# Patient Record
Sex: Female | Born: 1970 | Race: Black or African American | Hispanic: No | Marital: Single | Smoking: Current every day smoker
Health system: Southern US, Community
[De-identification: ages and names within clinical notes are randomized; demographics above are authoritative.]

## PROBLEM LIST (undated history)

## (undated) DIAGNOSIS — M797 Fibromyalgia: Secondary | ICD-10-CM

## (undated) DIAGNOSIS — M1990 Unspecified osteoarthritis, unspecified site: Secondary | ICD-10-CM

## (undated) DIAGNOSIS — M069 Rheumatoid arthritis, unspecified: Secondary | ICD-10-CM

## (undated) DIAGNOSIS — L309 Dermatitis, unspecified: Secondary | ICD-10-CM

## (undated) DIAGNOSIS — I10 Essential (primary) hypertension: Secondary | ICD-10-CM

## (undated) DIAGNOSIS — E119 Type 2 diabetes mellitus without complications: Secondary | ICD-10-CM

---

## 2006-09-15 ENCOUNTER — Emergency Department (HOSPITAL_COMMUNITY): Admission: EM | Admit: 2006-09-15 | Discharge: 2006-09-15 | Payer: Self-pay | Admitting: Emergency Medicine

## 2006-11-18 ENCOUNTER — Ambulatory Visit: Payer: Self-pay | Admitting: Internal Medicine

## 2007-02-10 ENCOUNTER — Encounter (INDEPENDENT_AMBULATORY_CARE_PROVIDER_SITE_OTHER): Payer: Self-pay | Admitting: Internal Medicine

## 2007-02-10 ENCOUNTER — Other Ambulatory Visit: Admission: RE | Admit: 2007-02-10 | Discharge: 2007-02-10 | Payer: Self-pay | Admitting: Internal Medicine

## 2007-02-10 ENCOUNTER — Ambulatory Visit: Payer: Self-pay | Admitting: Internal Medicine

## 2007-02-13 ENCOUNTER — Ambulatory Visit: Payer: Self-pay | Admitting: Internal Medicine

## 2007-05-05 ENCOUNTER — Ambulatory Visit: Payer: Self-pay | Admitting: Internal Medicine

## 2007-05-05 DIAGNOSIS — R609 Edema, unspecified: Secondary | ICD-10-CM | POA: Insufficient documentation

## 2007-05-08 ENCOUNTER — Ambulatory Visit: Payer: Self-pay | Admitting: Internal Medicine

## 2007-06-15 ENCOUNTER — Encounter: Admission: RE | Admit: 2007-06-15 | Discharge: 2007-07-20 | Payer: Self-pay | Admitting: Anesthesiology

## 2007-06-28 ENCOUNTER — Encounter (INDEPENDENT_AMBULATORY_CARE_PROVIDER_SITE_OTHER): Payer: Self-pay | Admitting: Internal Medicine

## 2007-06-28 DIAGNOSIS — E782 Mixed hyperlipidemia: Secondary | ICD-10-CM | POA: Insufficient documentation

## 2007-07-11 ENCOUNTER — Telehealth (INDEPENDENT_AMBULATORY_CARE_PROVIDER_SITE_OTHER): Payer: Self-pay | Admitting: Internal Medicine

## 2007-07-11 DIAGNOSIS — L732 Hidradenitis suppurativa: Secondary | ICD-10-CM | POA: Insufficient documentation

## 2007-11-09 ENCOUNTER — Ambulatory Visit: Payer: Self-pay | Admitting: Nurse Practitioner

## 2007-11-09 DIAGNOSIS — R51 Headache: Secondary | ICD-10-CM | POA: Insufficient documentation

## 2007-11-09 DIAGNOSIS — IMO0001 Reserved for inherently not codable concepts without codable children: Secondary | ICD-10-CM | POA: Insufficient documentation

## 2007-11-09 DIAGNOSIS — R519 Headache, unspecified: Secondary | ICD-10-CM | POA: Insufficient documentation

## 2008-01-02 ENCOUNTER — Encounter (INDEPENDENT_AMBULATORY_CARE_PROVIDER_SITE_OTHER): Payer: Self-pay | Admitting: Internal Medicine

## 2008-01-05 ENCOUNTER — Encounter (INDEPENDENT_AMBULATORY_CARE_PROVIDER_SITE_OTHER): Payer: Self-pay | Admitting: Internal Medicine

## 2008-02-01 ENCOUNTER — Telehealth (INDEPENDENT_AMBULATORY_CARE_PROVIDER_SITE_OTHER): Payer: Self-pay | Admitting: Internal Medicine

## 2008-02-06 ENCOUNTER — Encounter (INDEPENDENT_AMBULATORY_CARE_PROVIDER_SITE_OTHER): Payer: Self-pay | Admitting: Internal Medicine

## 2008-02-06 ENCOUNTER — Ambulatory Visit: Payer: Self-pay | Admitting: Nurse Practitioner

## 2008-06-28 ENCOUNTER — Encounter (INDEPENDENT_AMBULATORY_CARE_PROVIDER_SITE_OTHER): Payer: Self-pay | Admitting: *Deleted

## 2008-07-06 ENCOUNTER — Emergency Department (HOSPITAL_COMMUNITY): Admission: EM | Admit: 2008-07-06 | Discharge: 2008-07-06 | Payer: Self-pay | Admitting: Family Medicine

## 2008-11-11 ENCOUNTER — Ambulatory Visit: Payer: Self-pay | Admitting: Family Medicine

## 2008-11-26 ENCOUNTER — Telehealth (INDEPENDENT_AMBULATORY_CARE_PROVIDER_SITE_OTHER): Payer: Self-pay | Admitting: Family Medicine

## 2008-11-27 ENCOUNTER — Telehealth (INDEPENDENT_AMBULATORY_CARE_PROVIDER_SITE_OTHER): Payer: Self-pay | Admitting: Family Medicine

## 2009-03-04 ENCOUNTER — Ambulatory Visit: Payer: Self-pay | Admitting: Family Medicine

## 2009-03-04 DIAGNOSIS — R358 Other polyuria: Secondary | ICD-10-CM

## 2009-03-04 DIAGNOSIS — R3589 Other polyuria: Secondary | ICD-10-CM | POA: Insufficient documentation

## 2009-03-04 DIAGNOSIS — I10 Essential (primary) hypertension: Secondary | ICD-10-CM | POA: Insufficient documentation

## 2009-03-04 LAB — CONVERTED CEMR LAB
Bilirubin Urine: NEGATIVE
Ketones, urine, test strip: NEGATIVE
Protein, U semiquant: NEGATIVE
Urobilinogen, UA: 0.2

## 2009-03-05 ENCOUNTER — Encounter (INDEPENDENT_AMBULATORY_CARE_PROVIDER_SITE_OTHER): Payer: Self-pay | Admitting: Internal Medicine

## 2009-03-06 ENCOUNTER — Telehealth (INDEPENDENT_AMBULATORY_CARE_PROVIDER_SITE_OTHER): Payer: Self-pay | Admitting: Internal Medicine

## 2009-03-07 ENCOUNTER — Emergency Department (HOSPITAL_COMMUNITY): Admission: EM | Admit: 2009-03-07 | Discharge: 2009-03-07 | Payer: Self-pay | Admitting: Emergency Medicine

## 2009-05-06 ENCOUNTER — Emergency Department (HOSPITAL_COMMUNITY): Admission: EM | Admit: 2009-05-06 | Discharge: 2009-05-06 | Payer: Self-pay | Admitting: Family Medicine

## 2009-07-31 ENCOUNTER — Telehealth (INDEPENDENT_AMBULATORY_CARE_PROVIDER_SITE_OTHER): Payer: Self-pay | Admitting: Internal Medicine

## 2009-08-19 ENCOUNTER — Ambulatory Visit: Payer: Self-pay | Admitting: Internal Medicine

## 2009-08-19 DIAGNOSIS — L0293 Carbuncle, unspecified: Secondary | ICD-10-CM

## 2009-08-19 DIAGNOSIS — L0292 Furuncle, unspecified: Secondary | ICD-10-CM | POA: Insufficient documentation

## 2009-08-20 ENCOUNTER — Encounter (INDEPENDENT_AMBULATORY_CARE_PROVIDER_SITE_OTHER): Payer: Self-pay | Admitting: Internal Medicine

## 2009-10-24 ENCOUNTER — Telehealth (INDEPENDENT_AMBULATORY_CARE_PROVIDER_SITE_OTHER): Payer: Self-pay | Admitting: Internal Medicine

## 2009-10-27 ENCOUNTER — Ambulatory Visit: Payer: Self-pay | Admitting: Physician Assistant

## 2009-10-27 DIAGNOSIS — N390 Urinary tract infection, site not specified: Secondary | ICD-10-CM | POA: Insufficient documentation

## 2009-10-27 LAB — CONVERTED CEMR LAB
Ketones, urine, test strip: NEGATIVE
Nitrite: NEGATIVE
Protein, U semiquant: NEGATIVE
Urobilinogen, UA: 0.2

## 2009-10-28 ENCOUNTER — Encounter: Payer: Self-pay | Admitting: Physician Assistant

## 2009-11-26 ENCOUNTER — Telehealth (INDEPENDENT_AMBULATORY_CARE_PROVIDER_SITE_OTHER): Payer: Self-pay | Admitting: Internal Medicine

## 2009-12-01 ENCOUNTER — Encounter (INDEPENDENT_AMBULATORY_CARE_PROVIDER_SITE_OTHER): Payer: Self-pay | Admitting: *Deleted

## 2010-02-03 ENCOUNTER — Telehealth: Payer: Self-pay | Admitting: Physician Assistant

## 2010-02-05 ENCOUNTER — Ambulatory Visit: Payer: Self-pay | Admitting: Physician Assistant

## 2010-04-28 ENCOUNTER — Ambulatory Visit: Payer: Self-pay | Admitting: Internal Medicine

## 2010-05-04 ENCOUNTER — Ambulatory Visit: Payer: Self-pay | Admitting: Nurse Practitioner

## 2010-05-04 LAB — CONVERTED CEMR LAB
Nitrite: NEGATIVE
Protein, U semiquant: NEGATIVE
WBC Urine, dipstick: NEGATIVE

## 2010-05-05 ENCOUNTER — Encounter (INDEPENDENT_AMBULATORY_CARE_PROVIDER_SITE_OTHER): Payer: Self-pay | Admitting: Nurse Practitioner

## 2010-05-12 ENCOUNTER — Telehealth (INDEPENDENT_AMBULATORY_CARE_PROVIDER_SITE_OTHER): Payer: Self-pay | Admitting: Internal Medicine

## 2010-05-14 ENCOUNTER — Ambulatory Visit: Payer: Self-pay | Admitting: Internal Medicine

## 2010-05-15 ENCOUNTER — Encounter (INDEPENDENT_AMBULATORY_CARE_PROVIDER_SITE_OTHER): Payer: Self-pay | Admitting: Internal Medicine

## 2010-06-27 ENCOUNTER — Emergency Department (HOSPITAL_COMMUNITY): Admission: EM | Admit: 2010-06-27 | Discharge: 2010-06-27 | Payer: Self-pay | Admitting: Emergency Medicine

## 2010-07-09 ENCOUNTER — Telehealth (INDEPENDENT_AMBULATORY_CARE_PROVIDER_SITE_OTHER): Payer: Self-pay | Admitting: Internal Medicine

## 2010-07-14 ENCOUNTER — Ambulatory Visit: Payer: Self-pay | Admitting: Internal Medicine

## 2010-07-14 DIAGNOSIS — M129 Arthropathy, unspecified: Secondary | ICD-10-CM | POA: Insufficient documentation

## 2010-07-14 LAB — CONVERTED CEMR LAB
Eosinophils Absolute: 0.1 10*3/uL (ref 0.0–0.7)
Eosinophils Relative: 2 % (ref 0–5)
HCT: 36.4 % (ref 36.0–46.0)
Lymphs Abs: 2.3 10*3/uL (ref 0.7–4.0)
MCV: 81.6 fL (ref 78.0–100.0)
Platelets: 344 10*3/uL (ref 150–400)
RDW: 15.6 % — ABNORMAL HIGH (ref 11.5–15.5)
Sed Rate: 49 mm/hr — ABNORMAL HIGH (ref 0–22)
WBC: 6.1 10*3/uL (ref 4.0–10.5)

## 2010-08-05 ENCOUNTER — Telehealth (INDEPENDENT_AMBULATORY_CARE_PROVIDER_SITE_OTHER): Payer: Self-pay | Admitting: Internal Medicine

## 2010-08-14 ENCOUNTER — Encounter (INDEPENDENT_AMBULATORY_CARE_PROVIDER_SITE_OTHER): Payer: Self-pay | Admitting: Internal Medicine

## 2010-11-17 NOTE — Progress Notes (Signed)
  Phone Note Call from Patient Call back at [phone]   Summary of Call: NEEDS TO BE SEEN FOR PAIN IN KIDNEY AREA/WILL SEE WHAT WE CAN DO FOR HER BEFORE SHE GOES TO URGENT CARE//PLEASE CALL BEFORE 5:00 Initial call taken by: Arta Bruce,  October 24, 2009 10:07 AM  Follow-up for Phone Call        pt states no fever 2 days frequent urination/lower back & side pain////// pt states this happened previously about a 1 1/2 years ago. she states she was admitted in hospital for the urinary tract infection. Follow-up by: Mikey College CMA,  October 24, 2009 3:34 PM  Additional Follow-up for Phone Call Additional follow up Details #1::        WORKED PT INTO SCHEDULE ON MONDAY TO SEE SCOTT. Additional Follow-up by: Mikey College CMA,  October 24, 2009 3:39 PM

## 2010-11-17 NOTE — Letter (Signed)
Summary: HANDICAPPED PLACARD  HANDICAPPED PLACARD   Imported By: Arta Bruce 07/14/2010 11:06:18  _____________________________________________________________________  External Attachment:    Type:   Image     Comment:   External Document

## 2010-11-17 NOTE — Assessment & Plan Note (Signed)
Summary: check boil /tmm   Vital Signs:  Patient profile:   40 year old female Weight:      303.06 pounds Temp:     98 degrees F Pulse rate:   98 / minute Pulse rhythm:   regular Resp:     20 per minute BP sitting:   162 / 113  (left arm) Cuff size:   large  Vitals Entered By: Chauncy Passy, SMA CC: Pt. is here for a f/u on her boil.  Is Patient Diabetic? No Pain Assessment Patient in pain? no       Does patient need assistance? Functional Status Self care Ambulation Normal   Primary Care Provider:  Julieanne Manson MD  CC:  Pt. is here for a f/u on her boil. [name]  History of Present Illness: Here for "boils" on lower abdomen present for several days. No fevers or chills, nausea or vomiting or lightheadedness. She is applying hot water to the area with minimal relief.  She has a h/o these and was last treated in 08/2009. Did take antibxs for UTI in 10/2009. She is not taking BP meds.  She forgets and does not like to take Maxzide due to diuretic effect. She denies headache, chest pain, dyspnea or syncope.   Current Medications (verified): 1)  Maxzide-25 37.5-25 Mg  Tabs (Triamterene-Hctz) .[name].. 1 Tablet By Mouth Daily 2)  Lorcet 10/650 10-650 Mg  Tabs (Hydrocodone-Acetaminophen) .[name].. 1 Once  Qid (Dr.dagwa...pain Management) 3)  Zanaflex 6 Mg  Caps (Tizanidine Hcl) .[name].. 1 Three Times A Day( Per Dr.dagwa). 4)  Meloxicam 15 Mg  Tabs (Meloxicam) .[name].. 1 Once Daily(Dr.dagwa) 5)  Cymbalta 60 Mg  Cpep (Duloxetine Hcl) .[name].. 1 Tablet By Mouth Daily  **rx By [facility] - [facility]** 6)  Valium 5 Mg Tabs (Diazepam) .... Take 1 Tablet By Mouth Once A Day(Dr.dagwa). 7)  Amlodipine Besylate 5 Mg Tabs (Amlodipine Besylate) .... Take 1 Tablet By Mouth Once A Day For Blood Pressure  Allergies (verified): No Known Drug Allergies  Physical Exam  General:  alert, well-developed, and well-nourished.   Head:  normocephalic and atraumatic.   Neck:  supple.   Lungs:  normal  breath sounds.   Heart:  normal rate and regular rhythm.   Neurologic:  alert & oriented X3 and cranial nerves II-XII intact.   Skin:  (2) nodular lesions on lower right abdomen approx 2 cm in diam Abscess that is more prox is more fluctuant and does have some "de-roofing" noted No d/c expressed More distal abscess is not as full and appears to be resolving Psych:  normally interactive.     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 1 tablet by mouth daily    Amlodipine Besylate 5 Mg Tabs (Amlodipine besylate) .[name]... Take 2  tablets by mouth once a day for blood pressure (note dose change)  Complete Medication List: 1)  Maxzide-25 37.5-25 Mg Tabs (Triamterene-hctz) .[name].. 1 tablet by mouth daily 2)  Lorcet 10/650 10-650 Mg Tabs (Hydrocodone-acetaminophen) .[name].. 1 once  qid (dr.dagwa...pain management) 3)  Zanaflex 6 Mg Caps (Tizanidine hcl) .[name].. 1 three times a day( per dr.dagwa). 4)  Meloxicam 15 Mg Tabs (Meloxicam) .[name] KitchenMarland KitchenMarland Kitchen  1 once daily(dr.dagwa) 5)  Cymbalta 60 Mg Cpep (Duloxetine hcl) .[name].. 1 tablet by mouth daily  **rx by gyarteng-dakwa - pain management** 6)  Valium 5 Mg Tabs (Diazepam) .... Take 1 tablet by mouth once a day(dr.dagwa). 7)  Amlodipine Besylate 5 Mg Tabs (Amlodipine besylate) .... Take 2  tablets by mouth once a day for blood pressure (note dose change) 8)  Bactrim Ds 800-160 Mg Tabs (Sulfamethoxazole-trimethoprim) .... Take 1 tablet by mouth two times a day until gone  Patient Instructions: 1)  Please schedule a follow-up appointment in 1 week with Dr. Delrae Alfred for boil and blood pressure.  2)  Take Bactrim until it is all gone. 3)  You can  take Tylenol or Ibuprofen for pain as needed. 4)  Increase Amlodipine to 5 mg 2 tablets once daily for blood pressure. Prescriptions: AMLODIPINE BESYLATE 5 MG TABS (AMLODIPINE BESYLATE) Take 2  tablets by mouth once a day for blood pressure (note dose change)  #60 x 5   Entered and Authorized by:   Tereso Newcomer PA-C   Signed by:   Tereso Newcomer PA-C on 02/05/2010   Method used:   Print then Give to Patient   RxID:   [id number] BACTRIM DS 800-160 MG TABS (SULFAMETHOXAZOLE-TRIMETHOPRIM) Take 1 tablet by mouth two times a day until gone  #14 x 0   Entered and Authorized by:   Tereso Newcomer PA-C   Signed by:   Tereso Newcomer PA-C on 02/05/2010   Method used:   Print then Give to Patient   RxID:   [id number]

## 2010-11-17 NOTE — Progress Notes (Signed)
  Phone Note Outgoing Call   Summary of Call: Needs OV to discuss permanent handicapped placard she has requested Initial call taken by: Julieanne Manson MD,  July 09, 2010 7:16 PM  Follow-up for Phone Call        done and pt given next available [facility]  July 10, 2010 5:16 PM

## 2010-11-17 NOTE — Assessment & Plan Note (Signed)
Summary: BOILS UNDER ARMS///KT   Vital Signs:  Patient profile:   40 year old female Temp:     98.3 degrees F oral  Vitals Entered By: Dutch Quint RN (May 14, 2010 9:47 AM) CC: persistent reoccurrence of boils under arms Pain Assessment Patient in pain? yes     Location: axillas Intensity: 4 Type: pressure Onset of pain  two days ago   Primary Care Provider:  Julieanne Manson MD  CC:  persistent reoccurrence of boils under arms.  History of Present Illness: Has history of furunculosis over the past several months, starting under the abdomen, then under breasts and now she has them under each arm.  Started feeling tight and sore, then knot was visible and palpable.  Opened night before, drained a little yellow-white pus.  Has been under a lot of stress lately.  Pt. states she just finished antibiotics for furuncles.  Discharge from right axilla with odor.  Allergies (verified): No Known Drug Allergies  Review of Systems       States has had edema under arms where boils are located. General:  Complains of malaise. Derm:  Complains of changes in color of skin, itching, lesion(s), and poor wound healing; States she has not been shaving, as per doctor's recommendation, but it doesn't make a difference.  The knots appeared anyway.  Used warm compresses and directed shower spray to area, areas opened and drained.  Pain was so bad that she had difficulty lifting or moving her arms..  Physical Exam  General:  alert, well-developed, well-nourished, and well-hydrated.   Extremities:  Bilateral Axilla with thickened scarring.  Deeper induration to scarring with swelling and erythema  Right worse than left.  On pressure over indurated area on right, thick, foul smelling light yellow pus expressed. Deodorant/antiperspirant caked over bilateral axilla Skin:  Clusters of raised areas under each axilla -- no erythema noted, some evidence of yellowish drainage, no active draining  noted.  Painful to touch, varied shape/size/hardness of nodules.   Impression & Recommendations:  Problem # 1:  HIDRADENITIS SUPPURATIVA (ICD-705.83) Treat infection again--swab for culture, though  high likelihood of contamination taken. Bactrim DS two times a day for 10 days. Consider surgical referral if not able to clear.  Discussed this will likely continue to be problematic.  Complete Medication List: 1)  Maxzide-25 37.5-25 Mg Tabs (Triamterene-hctz) .[name].. 1 tablet by mouth daily 2)  Lorcet 10/650 10-650 Mg Tabs (Hydrocodone-acetaminophen) .[name].. 1 once  qid (dr.dagwa...pain management) 3)  Zanaflex 6 Mg Caps (Tizanidine hcl) .[name].. 1 three times a day( per dr.dagwa). 4)  Meloxicam 15 Mg Tabs (Meloxicam) .[name].. 1 once daily(dr.dagwa) 5)  Cymbalta 60 Mg Cpep (Duloxetine hcl) .[name].. 1 tablet by mouth daily  **rx by gyarteng-dakwa - pain management** 6)  Valium 5 Mg Tabs (Diazepam) .... Take 1 tablet by mouth once a day(dr.dagwa). 7)  Amlodipine Besylate 5 Mg Tabs (Amlodipine besylate) .... Take 2  tablets by mouth once a day for blood pressure (note dose change) 8)  Phenazopyridine Hcl 200 Mg Tabs (Phenazopyridine hcl) .... One tablet by mouth three times a day for bladder 9)  Sulfamethoxazole-tmp Ds 800-160 Mg Tabs (Sulfamethoxazole-trimethoprim) .[name].. 1 tab by mouth two times a day for 10 days  Patient Instructions: 1)  Stop deodorant/antiperspirant 2)  Warm pack armpits for 20 minutes two times a day  3)  Follow up Tuesday after next with Dr. Delrae Alfred Prescriptions: SULFAMETHOXAZOLE-TMP DS 800-160 MG TABS (SULFAMETHOXAZOLE-TRIMETHOPRIM) 1 tab by mouth two times a day for  10 days  #20 x 0   Entered and Authorized by:   Julieanne Manson MD   Signed by:   Julieanne Manson MD on 05/14/2010   Method used:   Electronically to        [facility] [phone]* (retail)       [address]       [place], [place]  [address]       Ph: [phone]       Fax: [phone]   RxID:    [id number]   Appended Document: BOILS UNDER ARMS///KT    Clinical Lists Changes  Orders: Added new Test order of T-Culture, Wound ([id number]) - Signed Added new Test order of T- * Misc. Laboratory test [phone]) - Signed

## 2010-11-17 NOTE — Assessment & Plan Note (Signed)
Summary: review application for handicap card..cm   Vital Signs:  Patient profile:   40 year old female Menstrual status:  regular LMP:     06/24/2010 Height:      65.75 inches Weight:      208 pounds Temp:     97.9 degrees F oral Pulse rate:   96 / minute Pulse rhythm:   regular Resp:     18 per minute BP sitting:   126 / 94  (left arm) Cuff size:   large  Vitals Entered By: Michelle Nasuti (July 14, 2010 8:47 AM) CC: reviewapplication for disability Pain Assessment Patient in pain? no       Does patient need assistance? Ambulation Normal LMP (date): 06/24/2010 LMP - Character: normal     Menstrual Status regular Enter LMP: 06/24/2010 Last PAP Result Done   Primary Care Provider:  Julieanne Manson MD  CC:  reviewapplication for disability.  History of Present Illness: Pt. here at my request as she had a permanent disabled parking permit sent in.  Pt. states she is renewing from initial 2005.  Pt. states it was originally written secondary to her fibromyalgia and OA.  Pt. states wrists, knees, MCP joints, particularly of left hand.  Right hand MCP joints can swell at times as well, but not as involved.  Often has morning stiffness.  Wears off in an hour, but must take her meds--PIP joints in particular.  Pt. going to [facility] on [address]--Dr. Dagwa.    Maternal uncle with hx of RA.  Allergies: No Known Drug Allergies  Physical Exam  Extremities:  Significant swelling of 2-3 MCPs of left hand.  Mild swelling of PIPs of fingers 2-4--a bit tender.  Wrists with swelling over dorsum--fairly good ROM, however. NT over joint lines of bilateral knees.   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 Concern for inflammatory arthritis Orders: T-CBC w/Diff [phone]) T-Antinuclear Antib (ANA) [phone]) T-Rheumatoid Factor [phone]) T-Sed Rate (Automated) [phone]) Diagnostic  X-Ray/Fluoroscopy (Diagnostic X-Ray/Flu)--hands  Problem # 2:  HYPERTENSION, BENIGN ESSENTIAL (ICD-401.1) Diastolic a bit up--will reevaluate at [facility] The following medications were removed from the medication list:    Maxzide-25 37.5-25 Mg Tabs (Triamterene-hctz) .[name]... 1 tablet by mouth daily Her updated medication list for this problem includes:    Amlodipine Besylate 5 Mg Tabs (Amlodipine besylate) .[name]... Take 2  tablets by mouth once a day for blood pressure (note dose change)  Complete Medication List: 1)  Lorcet 10/650 10-650 Mg Tabs (Hydrocodone-acetaminophen) .[name].. 1 once  qid (dr.dagwa...pain management) 2)  Zanaflex 6 Mg Caps (Tizanidine hcl) .[name].. 1 three times a day( per dr.dagwa). 3)  Meloxicam 15 Mg Tabs (Meloxicam) .[name].. 1 once daily(dr.dagwa) 4)  Cymbalta 60 Mg Cpep (Duloxetine hcl) .[name].. 1 tablet by mouth daily  **rx by gyarteng-dakwa - pain management** 5)  Valium 5 Mg Tabs (Diazepam) .... Take 1 tablet by mouth once a day(dr.dagwa). 6)  Amlodipine Besylate 5 Mg Tabs (Amlodipine besylate) .... Take 2  tablets by mouth once a day for blood pressure (note dose change)  Patient Instructions: 1)  CPP with Dr. Delrae Alfred next available.

## 2010-11-17 NOTE — Progress Notes (Signed)
Summary: BOIL BETWEEN BREAST  Phone Note Call from Patient Call back at Home Phone [phone]   Reason for Call: Refill Medication Summary of Call: WEAVER PT. MS Brick IS CALLING TO SEE IF SHE CAN GET ANOTHER REFILL ON THE BACTRIM, BECAUSE SHE NOW HAS A BOIL BETWEEN HER BREAST. SHE USES [facility] ON [address]. Initial call taken by: Leodis Rains,  November 26, 2009 2:29 PM  Follow-up for Phone Call        subscribers request the phone does not accepting phone calls..Arm[place] Shannon  November 26, 2009 2:54 PM  subscribers request the phone does not accecpting incoming calls.[name] KitchenMarland KitchenArmenia Shannon  November 27, 2009 9:53 AM   subscribers request the phone is not accepting incoming calls... will mail letter..... [place] Shannon  December 01, 2009 9:25 AM

## 2010-11-17 NOTE — Assessment & Plan Note (Signed)
Summary: Acute - UTI   Vital Signs:  Patient profile:   40 year old female LMP:     04/2010 Weight:      293.7 pounds BMI:     47.94 Temp:     97.9 degrees F oral Pulse rate:   98 / minute Pulse rhythm:   regular Resp:     20 per minute BP sitting:   138 / 91  (left arm) Cuff size:   large  Vitals Entered By: Levon Hedger (May 04, 2010 11:30 AM) CC: pain in lower back and both sides when she has to go urinate and after urinating she is experiencing constant pain x 1 week that has gotten worse, Dysuria Is Patient Diabetic? No Pain Assessment Patient in pain? yes     Location: back, sides Intensity: 6  Does patient need assistance? Functional Status Self care Ambulation Normal LMP (date): 04/2010 LMP - Character: normal     Enter LMP: 04/2010 Last PAP Result Done   Primary Care Provider:  Julieanne Manson MD  CC:  pain in lower back and both sides when she has to go urinate and after urinating she is experiencing constant pain x 1 week that has gotten worse and Dysuria.  History of Present Illness:  Pt into the office with c/o pain in sides and urinary problems. Pt was admitted to [facility] for 2 weeks for pyelo about 2 years ago.  She also had a UTI dx in this office about 1 year ago  Dysuria      This is a 40 year old woman who presents with Dysuria.  The symptoms began 1 week ago.  The intensity is described as moderate-severe.  The patient complains of burning with urination and urinary frequency, but denies hematuria.  Associated symptoms include flank pain.  The patient denies the following associated symptoms: nausea, vomiting, and fever.  The patient denies the following risk factors: diabetes and prior antibiotics.   +tobacco  -denies any soda, coffee, or tea denies any spicy foods denies any vaginal discharge last menses 1 week ago  Right breast abscess - start on doxycycline on 04/28/2010 and pt has been taking as ordered.  Habits &  Providers  Alcohol-Tobacco-Diet     Tobacco Status: current     Cigarette Packs/Day: 1/4-1/2     Year Started: 1993  Exercise-Depression-Behavior     Does Patient Exercise: no     Drug Use: no     Seat Belt Use: 100     Sun Exposure: occasionally  Allergies (verified): No Known Drug Allergies  Review of Systems General:  Denies fever. CV:  Denies chest pain or discomfort. Resp:  Denies cough. GI:  Denies bloody stools, nausea, and vomiting; bil flank pain. GU:  Complains of dysuria and urinary frequency; denies hematuria. MS:  Complains of joint pain; chronic from fibromyalgia.  Physical Exam  General:  alert.  obese Head:  normocephalic.   Lungs:  normal breath sounds.   Heart:  normal rate and regular rhythm.   Abdomen:  soft, non-tender, and normal bowel sounds.    Psych:  flat affect   Impression & Recommendations:  Problem # 1:  UTI (ICD-599.0) History of UTI will send urine for culture - will give pyridium three times a day for spasms advised pt to avoid triggers that may cause bladder spasms  Orders: UA Dipstick w/o Micro (manual) ([id number]) T-Culture, Urine ([id number])  Complete Medication List: 1)  Maxzide-25 37.5-25 Mg Tabs (Triamterene-hctz) .[name] KitchenMarland KitchenMarland Kitchen  1 tablet by mouth daily 2)  Lorcet 10/650 10-650 Mg Tabs (Hydrocodone-acetaminophen) .[name].. 1 once  qid (dr.dagwa...pain management) 3)  Zanaflex 6 Mg Caps (Tizanidine hcl) .[name].. 1 three times a day( per dr.dagwa). 4)  Meloxicam 15 Mg Tabs (Meloxicam) .[name].. 1 once daily(dr.dagwa) 5)  Cymbalta 60 Mg Cpep (Duloxetine hcl) .[name].. 1 tablet by mouth daily  **rx by gyarteng-dakwa - pain management** 6)  Valium 5 Mg Tabs (Diazepam) .... Take 1 tablet by mouth once a day(dr.dagwa). 7)  Amlodipine Besylate 5 Mg Tabs (Amlodipine besylate) .... Take 2  tablets by mouth once a day for blood pressure (note dose change) 8)  Doxycycline Hyclate 100 Mg Tabs (Doxycycline hyclate) .[name].. 1 tab by mouth two times a day for 10 days 9)   Phenazopyridine Hcl 200 Mg Tabs (Phenazopyridine hcl) .... One tablet by mouth three times a day for bladder  Patient Instructions: 1)  Continue to take medications for breast abscess as ordered. 2)  Your urine will be sent for culture to determine if there is a need for additional infection medication. 3)  In the meantime start phenazopyridine 200mg  by mouth three times a day.  This medication may turn your urine orange in color. 4)  Avoid caffiene and spicy foods as these are triggers. Prescriptions: PHENAZOPYRIDINE HCL 200 MG TABS (PHENAZOPYRIDINE HCL) One tablet by mouth three times a day for bladder  #15 x 0   Entered and Authorized by:   Lehman Prom FNP   Signed by:   Lehman Prom FNP on 05/04/2010   Method used:   Print then Give to Patient   RxID:   [id number]   Laboratory Results   Urine Tests  Date/Time Received: May 04, 2010 12:10 PM   Routine Urinalysis   Color: lt. yellow Glucose: negative   (Normal Range: Negative) Bilirubin: negative   (Normal Range: Negative) Ketone: negative   (Normal Range: Negative) Spec. Gravity: 1.015   (Normal Range: 1.003-1.035) Blood: small   (Normal Range: Negative) pH: 6.0   (Normal Range: 5.0-8.0) Protein: negative   (Normal Range: Negative) Urobilinogen: 0.2   (Normal Range: 0-1) Nitrite: negative   (Normal Range: Negative) Leukocyte Esterace: negative   (Normal Range: Negative)

## 2010-11-17 NOTE — Progress Notes (Signed)
  Phone Note Call from Patient   Summary of Call: cbc with code  716.99 not covered please call [phone] Tish Initial call taken by: Michelle Nasuti,  August 05, 2010 2:32 PM  Follow-up for Phone Call        LAB CALLED AGAIN ASKING FOR A CODE.[name] KitchenArmenia Shannon  August 07, 2010 12:21 PM   Additional Follow-up for Phone Call Additional follow up Details #1::        Called and left message with above phone number to call me on my cell phone to discuss. Additional Follow-up by: Julieanne Manson MD,  August 26, 2010 11:15 AM    Additional Follow-up for Phone Call Additional follow up Details #2::    Called again and left messages--called billing at [facility] and they just forwarded me to same voice mail.  Julieanne Manson MD  August 28, 2010 9:09 AM

## 2010-11-17 NOTE — Letter (Signed)
Summary: *HSN Results Follow up  [facility]  [address] [place], [place] [address]   Phone: [phone]  Fax: [phone]      12/01/2009   [facility] [address] [place], [place]  [address]   Dear  Ms. Crystale Coppolino,                            ____S.Drinkard,FNP   ____D. Gore,FNP       ____B. McPherson,MD   ____V. Rankins,MD    ____E. Mulberry,MD    ____N. Daphine Deutscher, FNP  ____D. Reche Dixon, MD    ____K. Philipp Deputy, MD    ____Other     This letter is to inform you that your recent test(s):  _______Pap Smear    _______Lab Test     _______X-ray    _______ is within acceptable limits  _______ requires a medication change  _______ requires a follow-up lab visit  _______ requires a follow-up visit with your provider   Comments:  We have been trying to reach you.  Please give the office a call at your earliest convenience.       _________________________________________________________ If you have any questions, please contact our office                     Sincerely,  Arm[place] Shannon [facility]

## 2010-11-17 NOTE — Letter (Signed)
Summary: *HSN Results Follow up  [facility]  [address] [place], [place] [address]   Phone: [phone]  Fax: [phone]      08/14/2010   [facility] [address] [place], [place]  [address]   Dear  Ms. Samatha Hanken,                            ____S.Drinkard,FNP   ____D. Gore,FNP       ____B. McPherson,MD   ____V. Rankins,MD    __X__E. Mulberry,MD    ____N. Daphine Deutscher, FNP  ____D. Reche Dixon, MD    ____K. Philipp Deputy, MD    ____Other     This letter is to inform you that your recent test(s):  _______Pap Smear    ___X____Lab Test     _______X-ray    ___X____ is within acceptable limits  _______ requires a medication change  _______ requires a follow-up lab visit  _______ requires a follow-up visit with your Arvo Ealy   Comments:  Your labs did not support a definite problem with inflammation of your joints--but it is something I would like to follow.       _________________________________________________________ If you have any questions, please contact our office                     Sincerely,  Julieanne Manson MD [facility]

## 2010-11-17 NOTE — Assessment & Plan Note (Signed)
Summary: boil under breast /tmm   Vital Signs:  Patient profile:   40 year old female Weight:      293 pounds Temp:     98.3 degrees F Pulse rate:   60 / minute Pulse rhythm:   regular Resp:     16 per minute BP sitting:   138 / 64  (left arm) Cuff size:   large  Vitals Entered By: Vesta Mixer CMA (April 28, 2010 12:18 PM) CC: boil under right breat Is Patient Diabetic? No Pain Assessment Patient in pain? no       Does patient need assistance? Ambulation Normal   Primary Care Provider:  Julieanne Manson MD  CC:  boil under right breat.  History of Present Illness: 1.  Boil started out as a knot under right breast 2-3 days ago.  Started to drain yesterday.  Feeling somewhat better.  Has felt feverish at times--no definite fever, however when took temp.  No allergies to meds.  Allergies (verified): No Known Drug Allergies  Physical Exam  Skin:  Adjacent 1 cm areas in crease under medial right breast that have opened with exposed inflammed tissue, but no fluctuance currently.  Not able to express any pus currently.  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  Complete Medication List: 1)  Maxzide-25 37.5-25 Mg Tabs (Triamterene-hctz) .[name].. 1 tablet by mouth daily 2)  Lorcet 10/650 10-650 Mg Tabs (Hydrocodone-acetaminophen) .[name].. 1 once  qid (dr.dagwa...pain management) 3)  Zanaflex 6 Mg Caps (Tizanidine hcl) .[name].. 1 three times a day( per dr.dagwa). 4)  Meloxicam 15 Mg Tabs (Meloxicam) .[name].. 1 once daily(dr.dagwa) 5)  Cymbalta 60 Mg Cpep (Duloxetine hcl) .[name].. 1 tablet by mouth daily  **rx by [facility] - pain management** 6)  Valium 5 Mg Tabs (Diazepam) .... Take 1 tablet by mouth once a day(dr.dagwa). 7)  Amlodipine Besylate 5 Mg Tabs (Amlodipine besylate) .... Take 2  tablets by mouth once a day for blood pressure (note dose change) 8)  Doxycycline Hyclate 100 Mg Tabs  (Doxycycline hyclate) .[name].. 1 tab by mouth two times a day for 10 days  Patient Instructions: 1)  Warm water soaks for 20 minutes two times a day --especially about 30 minutes after taking Doxycycline.  2)  Try and keep wound covered and dry.  3)  Call if wound worsens Prescriptions: DOXYCYCLINE HYCLATE 100 MG TABS (DOXYCYCLINE HYCLATE) 1 tab by mouth two times a day for 10 days  #20 x 0   Entered and Authorized by:   Julieanne Manson MD   Signed by:   Julieanne Manson MD on 04/28/2010   Method used:   Electronically to        [facility] [phone]* (retail)       [address]       [place], [place]  [address]       Ph: [phone]       Fax: [phone]   RxID:   [id number]

## 2010-11-17 NOTE — Progress Notes (Signed)
Summary: NEEDS MEDS REFILLED  Phone Note Call from Patient Call back at Home Phone [phone]   Reason for Call: Refill Medication Summary of Call: Shelby Robertson CALLED TO SEE IF SHE CAN GET A REFILL ON HER BARTRIM FOR HAIR BUMP (MEANING BOIL) THAT SHE SAYS GETS INFECTED. MS Loring ALSO SAYS THAT SHE CALLED ABOUT THIS IN FEBRUARY BUT NEVER HEARD FROM ANYONE ABOUT THIS. AFTER LOOKING IN THE FEBRUARY NOTE HER # WAS WRONG, AND SHE SAYS THAT SHE NEVER RECEIVED A LETTER FROM [place] ABOUT CALLING THE OFFICE.  BUT NOW WE HAVE THE CORRECT #. Initial call taken by: Leodis Rains,  February 03, 2010 4:26 PM  Follow-up for Phone Call        Pt having another flare of a recurrent boil that she gets and is requesting for bactrim be sent to [address]. Follow-up by: Vesta Mixer CMA,  February 04, 2010 9:16 AM  Additional Follow-up for Phone Call Additional follow up Details #1::        Needs to be seen. Additional Follow-up by: Tereso Newcomer PA-C,  February 04, 2010 1:31 PM    Additional Follow-up for Phone Call Additional follow up Details #2::    Pt will come in in the morning to have boil looked at. Follow-up by: Vesta Mixer CMA,  February 04, 2010 3:05 PM

## 2010-11-17 NOTE — Progress Notes (Signed)
Summary: Painful knots under arms  Phone Note Call from Patient   Summary of Call: States that the boil under her breast cleared up, now she has the same thing under each arm -- they were open, but have closed and are now just knots. Denies redness, but states they are painful.   Initial call taken by: Dutch Quint RN,  May 12, 2010 11:16 AM  Follow-up for Phone Call        ms Dolinar called again today to see if she could be seen by someone else today.  she says that the boils have some drainage fro them. Follow-up by: Leodis Rains,  May 13, 2010 12:57 PM  Additional Follow-up for Phone Call Additional follow up Details #1::        Apply warm compresses to boils for 20 minutes two times a day.  Allow to drain. If does not gradually heal--make OV to be seen. Additional Follow-up by: Julieanne Manson MD,  May 13, 2010 11:38 PM    Additional Follow-up for Phone Call Additional follow up Details #2::    Pt. in office for initial triage visit. Follow-up by: Dutch Quint RN,  May 14, 2010 9:47 AM

## 2010-11-17 NOTE — Assessment & Plan Note (Signed)
Summary: URINARY TRACT INFECTION///RJP   Vital Signs:  Patient profile:   40 year old female Height:      65.75 inches Weight:      303 pounds BMI:     49.46 Temp:     97.5 degrees F oral Pulse rate:   111 / minute Pulse rhythm:   regular Resp:     18 per minute BP sitting:   142 / 106  (left arm) Cuff size:   large  Vitals Entered By: [place] Shannon (October 27, 2009 11:52 AM) CC: uti....., Hypertension Management Is Patient Diabetic? No Pain Assessment Patient in pain? no       Does patient need assistance? Functional Status Self care Ambulation Normal   CC:  uti..... and Hypertension Management.  History of Present Illness: 41 year old female presents for symptoms concerning for urinary tract infection.  She describes an episode of urosepsis about 2 years ago.  She was hospitalized in [facility].  She did not know she had a urinary tract infection at that time.  Over the last week she's developed some left lower back discomfort that seems to worsen somewhat with urination.  She also has urinary frequency.  She really denies any true dysuria.  She has not seen any hematuria.  She denies any colicky pain.  She denies fevers or chills.  She denies nausea or vomiting.  She notes that she has fibromyalgia and is enrolled in a pain clinic.  Her blood pressure goes up when she is in a lot of pain.  She's not yet taken her pain medications today.  She also notes that the pain in her lower back related to her urination seems to be bothering her as well.  She denies chest pain.  She denies shortness of breath.  She denies syncope.  She denies significant headaches.  Hypertension History:      Positive major cardiovascular risk factors include hyperlipidemia, hypertension, and current tobacco user.  Negative major cardiovascular risk factors include female age less than 11 years old and negative family history for ischemic heart disease.     Problems Prior to Update: 1)  Uti   (ICD-599.0) 2)  Furunculosis  (ICD-680.9) 3)  Hypertension, Benign Essential  (ICD-401.1) 4)  Polyuria  (ICD-788.42) 5)  Accident Caused Hot Liquids&vapors Incl Steam  (ICD-E924.0) 6)  Headache  (ICD-784.0) 7)  Fibromyalgia  (ICD-729.1) 8)  Hidradenitis Suppurativa  (ICD-705.83) 9)  Dyslipidemia  (ICD-272.4) 10)  Leg Edema, Bilateral  (ICD-782.3)  Current Medications (verified): 1)  Maxzide-25 37.5-25 Mg  Tabs (Triamterene-Hctz) .[name].. 1 Tablet By Mouth Daily 2)  Lorcet 10/650 10-650 Mg  Tabs (Hydrocodone-Acetaminophen) .[name].. 1 Once  Qid (Dr.dagwa...pain Management) 3)  Zanaflex 6 Mg  Caps (Tizanidine Hcl) .[name].. 1 Three Times A Day( Per Dr.dagwa). 4)  Meloxicam 15 Mg  Tabs (Meloxicam) .[name].. 1 Once Daily(Dr.dagwa) 5)  Cymbalta 60 Mg  Cpep (Duloxetine Hcl) .[name].. 1 Tablet By Mouth Daily  **rx By [facility] - Pain Management** 6)  Valium 5 Mg Tabs (Diazepam) .... Take 1 Tablet By Mouth Once A Day(Dr.dagwa). 7)  Bactrim Ds 800-160 Mg Tabs (Sulfamethoxazole-Trimethoprim) .[name].. 1 Tab By Mouth Two Times A Day For 10 Days  Allergies (verified): No Known Drug Allergies  Past History:  Past Medical History: Last updated: 11/11/2008 HIDRADENITIS SUPPURATIVA (ICD-705.83) DYSLIPIDEMIA (ICD-272.4) LEG EDEMA, BILATERAL (ICD-782.3) Chronic Pain  Physical Exam  General:  well-nourished well-developed woman in no acute distress Head:  normocephalic atraumatic Neck:  supple Lungs:  clear to auscultation  bilaterally, no wheezing no rales Heart:  regular rate and rhythm, normal S1-S2, no murmur Abdomen:  soft, normal active bowel sounds, no hepatomegaly Very mild suprapubic tenderness with palpation Msk:  CVA tenderness difficult to assess given the patient's underlying fibromyalgia Skin:  approximately 2-3 cm fluctuant mass right chest in the midaxillary line, near the breast consistent with a furuncle, somewhat tender   Impression & Recommendations:  Problem # 1:  UTI (ICD-599.0)  tx with  bactrim  see no. 2 culture  The following medications were removed from the medication list:    Bactrim Ds 800-160 Mg Tabs (Sulfamethoxazole-trimethoprim) .[name]... 1 tab by mouth two times a day for 10 days Her updated medication list for this problem includes:    Bactrim Ds 800-160 Mg Tabs (Sulfamethoxazole-trimethoprim) .[name]... 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 Delrae Alfred  Problem # 3:  HYPERTENSION, BENIGN ESSENTIAL (ICD-401.1) uncontrolled  add amlodipine  Her updated medication list for this problem includes:    Maxzide-25 37.5-25 Mg Tabs (Triamterene-hctz) .[name]... 1 tablet by mouth daily    Amlodipine Besylate 5 Mg Tabs (Amlodipine besylate) .[name]... Take 1 tablet by mouth once a day for blood pressure  Complete Medication List: 1)  Maxzide-25 37.5-25 Mg Tabs (Triamterene-hctz) .[name].. 1 tablet by mouth daily 2)  Lorcet 10/650 10-650 Mg Tabs (Hydrocodone-acetaminophen) .[name].. 1 once  qid (dr.dagwa...pain management) 3)  Zanaflex 6 Mg Caps (Tizanidine hcl) .[name].. 1 three times a day( per dr.dagwa). 4)  Meloxicam 15 Mg Tabs (Meloxicam) .[name].. 1 once daily(dr.dagwa) 5)  Cymbalta 60 Mg Cpep (Duloxetine hcl) .[name].. 1 tablet by mouth daily  **rx by gyarteng-dakwa - pain management** 6)  Valium 5 Mg Tabs (Diazepam) .... Take 1 tablet by mouth once a day(dr.dagwa). 7)  Bactrim Ds 800-160 Mg Tabs (Sulfamethoxazole-trimethoprim) .... Take 1 tablet by mouth two times a day until all gone 8)  Amlodipine Besylate 5 Mg Tabs (Amlodipine besylate) .... Take 1 tablet by mouth once a day for blood pressure  Hypertension Assessment/Plan:      The patient's hypertensive risk group is category B: At least one risk factor (excluding diabetes) with no target organ damage.  Today's blood pressure is 142/106.    Patient  Instructions: 1)  Please schedule a follow-up appointment in 2 weeks with Dr. Delrae Alfred for blood pressure and boil. 2)  Return sooner if no better or worse. 3)  Apply warm compresses to boil on right side twice a day to three times a day. 4)    Prescriptions: AMLODIPINE BESYLATE 5 MG TABS (AMLODIPINE BESYLATE) Take 1 tablet by mouth once a day for blood pressure  #30 x 5   Entered and Authorized by:   Tereso Newcomer PA-C   Signed by:   Tereso Newcomer PA-C on 10/27/2009   Method used:   Print then Give to Patient   RxID:   [id number] BACTRIM DS 800-160 MG TABS (SULFAMETHOXAZOLE-TRIMETHOPRIM) Take 1 tablet by mouth two times a day until all gone  #20 x 0   Entered and Authorized by:   Tereso Newcomer PA-C   Signed by:   Tereso Newcomer PA-C on 10/27/2009   Method used:   Print then Give to Patient   RxID:   [id number]   Laboratory Results   Urine Tests    Routine Urinalysis   Glucose:  negative   (Normal Range: Negative) Bilirubin: negative   (Normal Range: Negative) Ketone: negative   (Normal Range: Negative) Spec. Gravity: 1.010   (Normal Range: 1.003-1.035) Blood: trace-intact   (Normal Range: Negative) pH: 6.0   (Normal Range: 5.0-8.0) Protein: negative   (Normal Range: Negative) Urobilinogen: 0.2   (Normal Range: 0-1) Nitrite: negative   (Normal Range: Negative) Leukocyte Esterace: negative   (Normal Range: Negative)

## 2010-12-31 LAB — URINE CULTURE
Colony Count: 9000
Culture  Setup Time: 201109111110

## 2010-12-31 LAB — POCT URINALYSIS DIPSTICK
Nitrite: NEGATIVE
Specific Gravity, Urine: 1.02 (ref 1.005–1.030)
pH: 5 (ref 5.0–8.0)

## 2011-01-24 LAB — POCT URINALYSIS DIP (DEVICE)
Ketones, ur: NEGATIVE mg/dL
Nitrite: NEGATIVE
Protein, ur: NEGATIVE mg/dL
Urobilinogen, UA: 0.2 mg/dL (ref 0.0–1.0)
pH: 6 (ref 5.0–8.0)

## 2011-01-24 LAB — URINE CULTURE

## 2011-01-24 LAB — POCT PREGNANCY, URINE: Preg Test, Ur: NEGATIVE

## 2011-02-23 ENCOUNTER — Inpatient Hospital Stay (INDEPENDENT_AMBULATORY_CARE_PROVIDER_SITE_OTHER)
Admission: RE | Admit: 2011-02-23 | Discharge: 2011-02-23 | Disposition: A | Discharge status: Home / Self Care | Payer: Medicare Other | Source: Ambulatory Visit | Attending: Emergency Medicine | Admitting: Emergency Medicine

## 2011-02-23 DIAGNOSIS — IMO0001 Reserved for inherently not codable concepts without codable children: Secondary | ICD-10-CM

## 2011-02-23 DIAGNOSIS — M545 Low back pain, unspecified: Secondary | ICD-10-CM

## 2011-02-23 LAB — POCT PREGNANCY, URINE: Preg Test, Ur: NEGATIVE

## 2011-02-23 LAB — POCT URINALYSIS DIP (DEVICE)
Ketones, ur: NEGATIVE mg/dL
Protein, ur: NEGATIVE mg/dL
Specific Gravity, Urine: 1.02 (ref 1.005–1.030)
pH: 6 (ref 5.0–8.0)

## 2011-03-18 ENCOUNTER — Other Ambulatory Visit (HOSPITAL_COMMUNITY): Payer: Self-pay | Admitting: Internal Medicine

## 2011-03-18 ENCOUNTER — Ambulatory Visit (HOSPITAL_COMMUNITY)
Admission: RE | Admit: 2011-03-18 | Discharge: 2011-03-18 | Disposition: A | Discharge status: Home / Self Care | Payer: Medicare Other | Source: Ambulatory Visit | Attending: Internal Medicine | Admitting: Internal Medicine

## 2011-03-18 DIAGNOSIS — R609 Edema, unspecified: Secondary | ICD-10-CM | POA: Insufficient documentation

## 2011-03-18 DIAGNOSIS — M1990 Unspecified osteoarthritis, unspecified site: Secondary | ICD-10-CM

## 2011-03-18 DIAGNOSIS — M25539 Pain in unspecified wrist: Secondary | ICD-10-CM | POA: Insufficient documentation

## 2011-03-18 DIAGNOSIS — M25549 Pain in joints of unspecified hand: Secondary | ICD-10-CM | POA: Insufficient documentation

## 2011-04-01 ENCOUNTER — Inpatient Hospital Stay (INDEPENDENT_AMBULATORY_CARE_PROVIDER_SITE_OTHER)
Admission: RE | Admit: 2011-04-01 | Discharge: 2011-04-01 | Disposition: A | Discharge status: Home / Self Care | Payer: Medicare Other | Source: Ambulatory Visit | Attending: Emergency Medicine | Admitting: Emergency Medicine

## 2011-04-01 DIAGNOSIS — N760 Acute vaginitis: Secondary | ICD-10-CM

## 2011-04-01 LAB — POCT URINALYSIS DIP (DEVICE)
Ketones, ur: NEGATIVE mg/dL
Protein, ur: NEGATIVE mg/dL
Specific Gravity, Urine: 1.015 (ref 1.005–1.030)

## 2011-04-01 LAB — WET PREP, GENITAL

## 2011-04-01 LAB — POCT PREGNANCY, URINE: Preg Test, Ur: NEGATIVE

## 2011-04-02 LAB — URINE CULTURE: Culture: NO GROWTH

## 2011-04-14 ENCOUNTER — Ambulatory Visit (HOSPITAL_COMMUNITY)
Admission: RE | Admit: 2011-04-14 | Discharge: 2011-04-14 | Disposition: A | Discharge status: Home / Self Care | Payer: Medicare Other | Source: Ambulatory Visit | Attending: Rheumatology | Admitting: Rheumatology

## 2011-04-14 ENCOUNTER — Other Ambulatory Visit (HOSPITAL_COMMUNITY): Payer: Self-pay | Admitting: Rheumatology

## 2011-04-14 DIAGNOSIS — D869 Sarcoidosis, unspecified: Secondary | ICD-10-CM

## 2011-04-14 DIAGNOSIS — M069 Rheumatoid arthritis, unspecified: Secondary | ICD-10-CM | POA: Insufficient documentation

## 2011-04-14 DIAGNOSIS — Z01818 Encounter for other preprocedural examination: Secondary | ICD-10-CM | POA: Insufficient documentation

## 2011-06-29 ENCOUNTER — Inpatient Hospital Stay (INDEPENDENT_AMBULATORY_CARE_PROVIDER_SITE_OTHER)
Admission: RE | Admit: 2011-06-29 | Discharge: 2011-06-29 | Disposition: A | Discharge status: Home / Self Care | Payer: Medicare Other | Source: Ambulatory Visit | Attending: Emergency Medicine | Admitting: Emergency Medicine

## 2011-06-29 DIAGNOSIS — L02219 Cutaneous abscess of trunk, unspecified: Secondary | ICD-10-CM

## 2011-07-02 ENCOUNTER — Inpatient Hospital Stay (HOSPITAL_COMMUNITY)
Admission: RE | Admit: 2011-07-02 | Discharge: 2011-07-02 | Disposition: A | Discharge status: Home / Self Care | Payer: Medicare Other | Source: Ambulatory Visit | Attending: Family Medicine | Admitting: Family Medicine

## 2011-07-19 LAB — POCT URINALYSIS DIP (DEVICE)
Glucose, UA: NEGATIVE
Nitrite: NEGATIVE
Operator id: 30745
Specific Gravity, Urine: 1.015
Urobilinogen, UA: 0.2

## 2011-09-02 ENCOUNTER — Encounter: Payer: Self-pay | Admitting: Emergency Medicine

## 2011-09-02 ENCOUNTER — Emergency Department (INDEPENDENT_AMBULATORY_CARE_PROVIDER_SITE_OTHER)
Admission: EM | Admit: 2011-09-02 | Discharge: 2011-09-02 | Disposition: A | Discharge status: Home / Self Care | Payer: Medicare Other | Source: Home / Self Care

## 2011-09-02 DIAGNOSIS — R071 Chest pain on breathing: Secondary | ICD-10-CM

## 2011-09-02 DIAGNOSIS — M545 Low back pain, unspecified: Secondary | ICD-10-CM

## 2011-09-02 HISTORY — DX: Fibromyalgia: M79.7

## 2011-09-02 HISTORY — DX: Unspecified osteoarthritis, unspecified site: M19.90

## 2011-09-02 HISTORY — DX: Essential (primary) hypertension: I10

## 2011-09-02 LAB — POCT URINALYSIS DIP (DEVICE)
Bilirubin Urine: NEGATIVE
Glucose, UA: NEGATIVE mg/dL
Ketones, ur: NEGATIVE mg/dL
Leukocytes, UA: NEGATIVE
Nitrite: NEGATIVE
pH: 5.5 (ref 5.0–8.0)

## 2011-09-02 NOTE — ED Notes (Signed)
C/o low back pain, onset 2 days ago.  Patient relates this pain to uti.  Reports symptoms as urinary urgency, frequency, pressure, and throbbing lower back

## 2011-09-03 NOTE — ED Provider Notes (Deleted)
History     CSN: [id number] Arrival date & time: 09/02/2011  8:09 PM   First MD Initiated Contact with Patient 09/02/11 1944      Chief Complaint  Patient presents with  . Back Pain    (Consider location/radiation/quality/duration/timing/severity/associated sxs/prior treatment) HPI Comments: Throbbing pain on my back and burning with urination  Patient is a 40 y.o. female presenting with back pain. The history is provided by the patient.  Back Pain  This is a new problem. The current episode started 2 days ago. The problem occurs constantly. The problem has not changed since onset.The pain is associated with no known injury. The quality of the pain is described as aching. The pain is at a severity of 1/10. Associated symptoms include dysuria. Pertinent negatives include no fever, no abdominal pain, no perianal numbness, no bladder incontinence, no paresthesias, no paresis and no tingling. She has tried nothing for the symptoms.    Past Medical History  Diagnosis Date  . Hypertension   . Fibromyalgia   . Osteoarthritis     History reviewed. No pertinent past surgical history.  History reviewed. No pertinent family history.  History  Substance Use Topics  . Smoking status: Current Everyday Smoker  . Smokeless tobacco: Not on file  . Alcohol Use: No    OB History    Grav Para Term Preterm Abortions TAB SAB Ect Mult Living                  Review of Systems  Constitutional: Negative.  Negative for fever and diaphoresis.  Gastrointestinal: Negative for abdominal pain.  Genitourinary: Positive for dysuria. Negative for bladder incontinence.  Musculoskeletal: Positive for back pain.  Neurological: Negative for tingling and paresthesias.    Allergies  Review of patient's allergies indicates no known allergies.  Home Medications   Current Outpatient Rx  Name Route Sig Dispense Refill  . AMLODIPINE BESYLATE 10 MG PO TABS Oral Take 10 mg by mouth daily.      [name]  DIAZEPAM 10 MG PO TABS Oral Take 10 mg by mouth every 6 (six) hours as needed.      . DULOXETINE HCL 60 MG PO CPEP Oral Take 60 mg by mouth daily.      [name] FOLIC ACID 1 MG PO TABS Oral Take 1 mg by mouth daily.      [name] HYDRALAZINE HCL 25 MG PO TABS Oral Take 25 mg by mouth 3 (three) times daily.      [name] HYDROCODONE-ACETAMINOPHEN 10-650 MG PO TABS Oral Take 1 tablet by mouth every 6 (six) hours as needed.      . MELOXICAM 15 MG PO TABS Oral Take 15 mg by mouth daily.      [name] METHOTREXATE 2.5 MG PO TABS Oral Take 2.5 mg by mouth once a week. Caution:Chemotherapy. Protect from light.     [name] TIZANIDINE HCL 4 MG PO TABS Oral Take 4 mg by mouth every 6 (six) hours as needed.        BP 130/92  Pulse 93  Temp(Src) 97.7 F (36.5 C) (Oral)  Resp 16  SpO2 97%  Physical Exam  Nursing note and vitals reviewed. Constitutional: She appears well-developed and well-nourished.  HENT:  Mouth/Throat: No oropharyngeal exudate.  Eyes: Right eye exhibits no discharge.  Abdominal: Soft.  Musculoskeletal: Normal range of motion.    ED Course  Procedures (including critical care time)  Labs Reviewed  POCT URINALYSIS DIP (DEVICE) - Abnormal; Notable for the following:  Hgb urine dipstick TRACE (*)    All other components within normal limits  LAB REPORT - SCANNED   No results found.   1. Low back pain       MDM  Urinary tract symptoms and back pain        Jimmie Molly, MD 09/03/11 [phone]

## 2011-09-03 NOTE — ED Provider Notes (Signed)
History     CSN: [id number] Arrival date & time: 09/02/2011  8:09 PM   First MD Initiated Contact with Patient 09/02/11 1944      Chief Complaint  Patient presents with  . Back Pain    (Consider location/radiation/quality/duration/timing/severity/associated sxs/prior treatment) Patient is a 40 y.o. female presenting with back pain. The history is provided by the patient.  Back Pain  This is a recurrent problem. The problem occurs constantly. The problem has not changed since onset.The pain is associated with no known injury. The pain is present in the lumbar spine. The quality of the pain is described as aching. The pain does not radiate. The pain is at a severity of 2/10. The pain is mild. The symptoms are aggravated by certain positions and bending (sitting in the toilet). Pertinent negatives include no fever, no numbness, no headaches, no abdominal pain, no bowel incontinence, no perianal numbness, no bladder incontinence, no dysuria, no pelvic pain, no leg pain, no paresthesias, no tingling and no weakness. Associated symptoms comments: Patient worried about UTI and states she had kidney infection 1 year ago and had back pain with it. But denies burning on urination, hematuria, fever, chills or nausea. She has noted pain in her back when she seats in the toilet. . She has tried muscle relaxants and analgesics for the symptoms. The treatment provided significant relief. Risk factors include obesity, lack of exercise and a sedentary lifestyle.    Past Medical History  Diagnosis Date  . Hypertension   . Fibromyalgia   . Osteoarthritis     History reviewed. No pertinent past surgical history.  History reviewed. No pertinent family history.  History  Substance Use Topics  . Smoking status: Current Everyday Smoker  . Smokeless tobacco: Not on file  . Alcohol Use: No    OB History    Grav Para Term Preterm Abortions TAB SAB Ect Mult Living                  Review of Systems    Constitutional: Negative.  Negative for fever.  Gastrointestinal: Negative for abdominal pain and bowel incontinence.  Genitourinary: Positive for frequency. Negative for bladder incontinence, dysuria, urgency, hematuria, vaginal discharge and pelvic pain.  Musculoskeletal: Positive for back pain.  Neurological: Negative for tingling, weakness, numbness, headaches and paresthesias.    Allergies  Review of patient's allergies indicates no known allergies.  Home Medications   Current Outpatient Rx  Name Route Sig Dispense Refill  . AMLODIPINE BESYLATE 10 MG PO TABS Oral Take 10 mg by mouth daily.      [name] DIAZEPAM 10 MG PO TABS Oral Take 10 mg by mouth every 6 (six) hours as needed.      . DULOXETINE HCL 60 MG PO CPEP Oral Take 60 mg by mouth daily.      [name] FOLIC ACID 1 MG PO TABS Oral Take 1 mg by mouth daily.      [name] HYDRALAZINE HCL 25 MG PO TABS Oral Take 25 mg by mouth 3 (three) times daily.      [name] HYDROCODONE-ACETAMINOPHEN 10-650 MG PO TABS Oral Take 1 tablet by mouth every 6 (six) hours as needed.      . MELOXICAM 15 MG PO TABS Oral Take 15 mg by mouth daily.      [name] METHOTREXATE 2.5 MG PO TABS Oral Take 2.5 mg by mouth once a week. Caution:Chemotherapy. Protect from light.     [name] TIZANIDINE HCL 4 MG PO TABS Oral  Take 4 mg by mouth every 6 (six) hours as needed.        BP 130/92  Pulse 93  Temp(Src) 97.7 F (36.5 C) (Oral)  Resp 16  SpO2 97%  Physical Exam  Nursing note and vitals reviewed. Constitutional: She is oriented to person, place, and time. She appears well-developed and well-nourished. No distress.       obese  Cardiovascular: Normal rate, regular rhythm and normal heart sounds.   Pulmonary/Chest: Breath sounds normal.  Abdominal: Soft. She exhibits no distension. There is no tenderness.  Musculoskeletal:       Tender and increased tone to palpation over bilateral paravertebral lumbar muscles. No pain over vertebral spinous processes. Fair flexion and extension,  although some restriction due to body habitus (obesity). Negative straight leg test. Normal strength and sensation in low extremities as well as symmetric and normal DTR's.   Neurological: She is alert and oriented to person, place, and time.    ED Course  Procedures (including critical care time)  Labs Reviewed  POCT URINALYSIS DIP (DEVICE) - Abnormal; Notable for the following:    Hgb urine dipstick TRACE (*)    All other components within normal limits  LAB REPORT - SCANNED   No results found.   1. Low back pain       MDM  No significant findings suggestive of UTI or Nephrolithiasis. Good pain control on self medications. Pt already established with pain management clinic. Hand outs and recommendations for weight management and low back exercises and pain crisis prevention provided. No new medications prescribed.        Sharin Grave, MD 09/03/11 1254

## 2011-10-02 ENCOUNTER — Encounter (HOSPITAL_COMMUNITY): Payer: Self-pay | Admitting: Physician Assistant

## 2011-10-02 ENCOUNTER — Emergency Department (INDEPENDENT_AMBULATORY_CARE_PROVIDER_SITE_OTHER)
Admission: EM | Admit: 2011-10-02 | Discharge: 2011-10-02 | Disposition: A | Discharge status: Home / Self Care | Payer: Medicare Other | Source: Home / Self Care

## 2011-10-02 ENCOUNTER — Other Ambulatory Visit: Payer: Self-pay

## 2011-10-02 DIAGNOSIS — R0789 Other chest pain: Secondary | ICD-10-CM

## 2011-10-02 DIAGNOSIS — IMO0001 Reserved for inherently not codable concepts without codable children: Secondary | ICD-10-CM

## 2011-10-02 DIAGNOSIS — M797 Fibromyalgia: Secondary | ICD-10-CM

## 2011-10-02 DIAGNOSIS — R071 Chest pain on breathing: Secondary | ICD-10-CM

## 2011-10-02 HISTORY — DX: Rheumatoid arthritis, unspecified: M06.9

## 2011-10-02 MED ORDER — METHYLPREDNISOLONE ACETATE 80 MG/ML IJ SUSP
INTRAMUSCULAR | Status: AC
  Filled 2011-10-02: qty 1

## 2011-10-02 MED ORDER — METHYLPREDNISOLONE ACETATE 80 MG/ML IJ SUSP
80.0000 mg | Freq: Once | INTRAMUSCULAR | Status: AC
  Administered 2011-10-02: 80 mg via INTRAMUSCULAR

## 2011-10-02 NOTE — ED Notes (Signed)
Chest pain onset 3 days ago.  Initially not bad, then worsened to the point where every movement or deep breath increases the pain.  Denies sob, nausea, vomiting.

## 2011-10-02 NOTE — ED Provider Notes (Signed)
History     CSN: [id number] Arrival date & time: 10/02/2011  4:39 PM   None     Chief Complaint  Patient presents with  . Chest Pain    (Consider location/radiation/quality/duration/timing/severity/associated sxs/prior treatment) HPI Comments: Chest pain, radiates down both arms. Pain is aching, sometimes sharp. Intermittent pain, worse with certain positions, movement, and deep breath. No dyspnea or diaphoresis. She had the same symptoms last month and her Rheumatologist treated her with Prednisone and she improved. She has been taking Metrotrexate which she feels is not working for her. Her next appt with her rheumatologist at [facility] is 11-04-11. She has an appt with [facility] next week. She states Hydrocodone doesn't help with her pain.  Patient is a 40 y.o. female presenting with chest pain. The history is provided by the patient.  Chest Pain The chest pain began 3 - 5 days ago. Chest pain occurs intermittently. The chest pain is unchanged. The pain is associated with breathing. The severity of the pain is moderate. The quality of the pain is described as aching and sharp. The pain radiates to the left arm and right arm. Chest pain is worsened by certain positions and deep breathing. Pertinent negatives for primary symptoms include no fever, no shortness of breath, no cough, no wheezing, no palpitations, no abdominal pain, no nausea and no vomiting. She tried narcotics for the symptoms. Risk factors include lack of exercise, obesity, sedentary lifestyle and smoking/tobacco exposure.     Past Medical History  Diagnosis Date  . Hypertension   . Fibromyalgia   . Osteoarthritis   . Fibromyalgia   . Rheumatoid arthritis   . Rheumatoid arthritis     History reviewed. No pertinent past surgical history.  History reviewed. No pertinent family history.  History  Substance Use Topics  . Smoking status: Current Everyday Smoker  . Smokeless tobacco: Not on file  . Alcohol Use: No     OB History    Grav Para Term Preterm Abortions TAB SAB Ect Mult Living                  Review of Systems  Constitutional: Negative for fever and chills.  Respiratory: Negative for cough, shortness of breath and wheezing.   Cardiovascular: Positive for chest pain. Negative for palpitations and leg swelling.  Gastrointestinal: Negative for nausea, vomiting and abdominal pain.    Allergies  Review of patient's allergies indicates no known allergies.  Home Medications   Current Outpatient Rx  Name Route Sig Dispense Refill  . AMLODIPINE BESYLATE 10 MG PO TABS Oral Take 10 mg by mouth daily.      [name] DIAZEPAM 10 MG PO TABS Oral Take 10 mg by mouth every 6 (six) hours as needed.      . DULOXETINE HCL 60 MG PO CPEP Oral Take 60 mg by mouth daily.      [name] FOLIC ACID 1 MG PO TABS Oral Take 1 mg by mouth daily.      [name] HYDRALAZINE HCL 25 MG PO TABS Oral Take 25 mg by mouth 3 (three) times daily.      [name] HYDROCODONE-ACETAMINOPHEN 10-650 MG PO TABS Oral Take 1 tablet by mouth every 6 (six) hours as needed.      . MELOXICAM 15 MG PO TABS Oral Take 15 mg by mouth daily.      [name] METHOTREXATE 2.5 MG PO TABS Oral Take 2.5 mg by mouth once a week. Caution:Chemotherapy. Protect from light.     [name]  TIZANIDINE HCL 4 MG PO TABS Oral Take 4 mg by mouth every 6 (six) hours as needed.        BP 147/98  Pulse 80  Temp(Src) 98.7 F (37.1 C) (Oral)  Resp 18  SpO2 100%  Physical Exam  Nursing note and vitals reviewed. Constitutional: She appears well-developed and well-nourished. No distress.  HENT:  Head: Normocephalic and atraumatic.  Right Ear: Tympanic membrane, external ear and ear canal normal.  Left Ear: Tympanic membrane, external ear and ear canal normal.  Nose: Nose normal.  Mouth/Throat: Uvula is midline, oropharynx is clear and moist and mucous membranes are normal. No oropharyngeal exudate, posterior oropharyngeal edema or posterior oropharyngeal erythema.  Neck: Neck supple.    Cardiovascular: Normal rate, regular rhythm and normal heart sounds.   Pulmonary/Chest: Effort normal and breath sounds normal. No respiratory distress. She exhibits tenderness.    Lymphadenopathy:    She has no cervical adenopathy.  Neurological: She is alert.  Skin: Skin is warm and dry.  Psychiatric: She has a normal mood and affect.    ED Course  Procedures (including critical care time)  Labs Reviewed - No data to display No results found.   1. Chest wall pain   2. Fibromyalgia       MDM  EKG NSR, rate 80. Chest wall tenderness, in pt with known fibromyalgia and RA. On chronic pain mgmt.        Melody Comas, [place] 10/02/11 [phone]

## 2011-10-02 NOTE — ED Notes (Signed)
Pain feels pressure depending what side she is lying on in bed, feels relief in certain positions

## 2011-10-03 NOTE — ED Provider Notes (Signed)
Medical screening examination/treatment/procedure(s) were performed by non-physician practitioner and as supervising physician I was immediately available for consultation/collaboration.   Ambers Iyengar DOUGLAS MD.    Cathy Crounse Douglas Oronde Hallenbeck, MD 10/03/11 1201 

## 2011-11-04 DIAGNOSIS — M069 Rheumatoid arthritis, unspecified: Secondary | ICD-10-CM | POA: Diagnosis not present

## 2011-11-04 DIAGNOSIS — M25549 Pain in joints of unspecified hand: Secondary | ICD-10-CM | POA: Diagnosis not present

## 2011-11-04 DIAGNOSIS — M25569 Pain in unspecified knee: Secondary | ICD-10-CM | POA: Diagnosis not present

## 2011-11-04 DIAGNOSIS — H81399 Other peripheral vertigo, unspecified ear: Secondary | ICD-10-CM | POA: Diagnosis not present

## 2011-11-04 DIAGNOSIS — F341 Dysthymic disorder: Secondary | ICD-10-CM | POA: Diagnosis not present

## 2011-11-04 DIAGNOSIS — G541 Lumbosacral plexus disorders: Secondary | ICD-10-CM | POA: Diagnosis not present

## 2011-11-04 DIAGNOSIS — Z79899 Other long term (current) drug therapy: Secondary | ICD-10-CM | POA: Diagnosis not present

## 2011-11-11 DIAGNOSIS — M069 Rheumatoid arthritis, unspecified: Secondary | ICD-10-CM | POA: Diagnosis not present

## 2011-11-17 DIAGNOSIS — M069 Rheumatoid arthritis, unspecified: Secondary | ICD-10-CM | POA: Diagnosis not present

## 2011-11-17 DIAGNOSIS — R5382 Chronic fatigue, unspecified: Secondary | ICD-10-CM | POA: Diagnosis not present

## 2011-11-17 DIAGNOSIS — G9332 Myalgic encephalomyelitis/chronic fatigue syndrome: Secondary | ICD-10-CM | POA: Diagnosis not present

## 2011-11-17 DIAGNOSIS — I10 Essential (primary) hypertension: Secondary | ICD-10-CM | POA: Diagnosis not present

## 2011-12-03 DIAGNOSIS — H81399 Other peripheral vertigo, unspecified ear: Secondary | ICD-10-CM | POA: Diagnosis not present

## 2011-12-03 DIAGNOSIS — F341 Dysthymic disorder: Secondary | ICD-10-CM | POA: Diagnosis not present

## 2011-12-03 DIAGNOSIS — G541 Lumbosacral plexus disorders: Secondary | ICD-10-CM | POA: Diagnosis not present

## 2011-12-03 DIAGNOSIS — M25549 Pain in joints of unspecified hand: Secondary | ICD-10-CM | POA: Diagnosis not present

## 2011-12-30 DIAGNOSIS — Z79899 Other long term (current) drug therapy: Secondary | ICD-10-CM | POA: Diagnosis not present

## 2011-12-30 DIAGNOSIS — M25549 Pain in joints of unspecified hand: Secondary | ICD-10-CM | POA: Diagnosis not present

## 2011-12-30 DIAGNOSIS — H81399 Other peripheral vertigo, unspecified ear: Secondary | ICD-10-CM | POA: Diagnosis not present

## 2011-12-30 DIAGNOSIS — G541 Lumbosacral plexus disorders: Secondary | ICD-10-CM | POA: Diagnosis not present

## 2011-12-30 DIAGNOSIS — F341 Dysthymic disorder: Secondary | ICD-10-CM | POA: Diagnosis not present

## 2012-02-14 DIAGNOSIS — M069 Rheumatoid arthritis, unspecified: Secondary | ICD-10-CM | POA: Diagnosis not present

## 2012-02-14 DIAGNOSIS — Z79899 Other long term (current) drug therapy: Secondary | ICD-10-CM | POA: Diagnosis not present

## 2012-02-14 DIAGNOSIS — M25549 Pain in joints of unspecified hand: Secondary | ICD-10-CM | POA: Diagnosis not present

## 2012-02-17 DIAGNOSIS — L02219 Cutaneous abscess of trunk, unspecified: Secondary | ICD-10-CM | POA: Diagnosis not present

## 2012-02-17 DIAGNOSIS — Z79899 Other long term (current) drug therapy: Secondary | ICD-10-CM | POA: Diagnosis not present

## 2012-02-17 DIAGNOSIS — L03319 Cellulitis of trunk, unspecified: Secondary | ICD-10-CM | POA: Diagnosis not present

## 2012-03-03 DIAGNOSIS — M25549 Pain in joints of unspecified hand: Secondary | ICD-10-CM | POA: Diagnosis not present

## 2012-03-03 DIAGNOSIS — G541 Lumbosacral plexus disorders: Secondary | ICD-10-CM | POA: Diagnosis not present

## 2012-03-03 DIAGNOSIS — F341 Dysthymic disorder: Secondary | ICD-10-CM | POA: Diagnosis not present

## 2012-03-03 DIAGNOSIS — H81399 Other peripheral vertigo, unspecified ear: Secondary | ICD-10-CM | POA: Diagnosis not present

## 2012-03-31 DIAGNOSIS — F341 Dysthymic disorder: Secondary | ICD-10-CM | POA: Diagnosis not present

## 2012-03-31 DIAGNOSIS — Z79899 Other long term (current) drug therapy: Secondary | ICD-10-CM | POA: Diagnosis not present

## 2012-03-31 DIAGNOSIS — H81399 Other peripheral vertigo, unspecified ear: Secondary | ICD-10-CM | POA: Diagnosis not present

## 2012-03-31 DIAGNOSIS — M25549 Pain in joints of unspecified hand: Secondary | ICD-10-CM | POA: Diagnosis not present

## 2012-03-31 DIAGNOSIS — G541 Lumbosacral plexus disorders: Secondary | ICD-10-CM | POA: Diagnosis not present

## 2012-03-31 DIAGNOSIS — M542 Cervicalgia: Secondary | ICD-10-CM | POA: Diagnosis not present

## 2012-05-02 DIAGNOSIS — G541 Lumbosacral plexus disorders: Secondary | ICD-10-CM | POA: Diagnosis not present

## 2012-05-02 DIAGNOSIS — F341 Dysthymic disorder: Secondary | ICD-10-CM | POA: Diagnosis not present

## 2012-05-02 DIAGNOSIS — M25549 Pain in joints of unspecified hand: Secondary | ICD-10-CM | POA: Diagnosis not present

## 2012-05-02 DIAGNOSIS — H81399 Other peripheral vertigo, unspecified ear: Secondary | ICD-10-CM | POA: Diagnosis not present

## 2012-06-10 DIAGNOSIS — R269 Unspecified abnormalities of gait and mobility: Secondary | ICD-10-CM | POA: Diagnosis not present

## 2012-06-10 DIAGNOSIS — Z9181 History of falling: Secondary | ICD-10-CM | POA: Diagnosis not present

## 2012-06-10 DIAGNOSIS — G541 Lumbosacral plexus disorders: Secondary | ICD-10-CM | POA: Diagnosis not present

## 2012-06-10 DIAGNOSIS — R42 Dizziness and giddiness: Secondary | ICD-10-CM | POA: Diagnosis not present

## 2012-06-10 DIAGNOSIS — M25549 Pain in joints of unspecified hand: Secondary | ICD-10-CM | POA: Diagnosis not present

## 2012-06-10 DIAGNOSIS — F341 Dysthymic disorder: Secondary | ICD-10-CM | POA: Diagnosis not present

## 2012-06-10 DIAGNOSIS — H81399 Other peripheral vertigo, unspecified ear: Secondary | ICD-10-CM | POA: Diagnosis not present

## 2012-06-20 DIAGNOSIS — Z79899 Other long term (current) drug therapy: Secondary | ICD-10-CM | POA: Diagnosis not present

## 2012-06-20 DIAGNOSIS — I10 Essential (primary) hypertension: Secondary | ICD-10-CM | POA: Diagnosis not present

## 2012-06-20 DIAGNOSIS — R7309 Other abnormal glucose: Secondary | ICD-10-CM | POA: Diagnosis not present

## 2012-07-01 DIAGNOSIS — H81399 Other peripheral vertigo, unspecified ear: Secondary | ICD-10-CM | POA: Diagnosis not present

## 2012-07-01 DIAGNOSIS — M25549 Pain in joints of unspecified hand: Secondary | ICD-10-CM | POA: Diagnosis not present

## 2012-07-01 DIAGNOSIS — F341 Dysthymic disorder: Secondary | ICD-10-CM | POA: Diagnosis not present

## 2012-07-01 DIAGNOSIS — Z79899 Other long term (current) drug therapy: Secondary | ICD-10-CM | POA: Diagnosis not present

## 2012-07-01 DIAGNOSIS — M542 Cervicalgia: Secondary | ICD-10-CM | POA: Diagnosis not present

## 2012-07-01 DIAGNOSIS — G541 Lumbosacral plexus disorders: Secondary | ICD-10-CM | POA: Diagnosis not present

## 2012-07-18 DIAGNOSIS — M069 Rheumatoid arthritis, unspecified: Secondary | ICD-10-CM | POA: Diagnosis not present

## 2012-07-18 DIAGNOSIS — Z79899 Other long term (current) drug therapy: Secondary | ICD-10-CM | POA: Diagnosis not present

## 2012-07-21 DIAGNOSIS — I10 Essential (primary) hypertension: Secondary | ICD-10-CM | POA: Diagnosis not present

## 2012-07-21 DIAGNOSIS — F329 Major depressive disorder, single episode, unspecified: Secondary | ICD-10-CM | POA: Diagnosis not present

## 2012-07-21 DIAGNOSIS — IMO0001 Reserved for inherently not codable concepts without codable children: Secondary | ICD-10-CM | POA: Diagnosis not present

## 2012-07-21 DIAGNOSIS — F3289 Other specified depressive episodes: Secondary | ICD-10-CM | POA: Diagnosis not present

## 2012-08-08 DIAGNOSIS — M25549 Pain in joints of unspecified hand: Secondary | ICD-10-CM | POA: Diagnosis not present

## 2012-08-08 DIAGNOSIS — F341 Dysthymic disorder: Secondary | ICD-10-CM | POA: Diagnosis not present

## 2012-08-08 DIAGNOSIS — G541 Lumbosacral plexus disorders: Secondary | ICD-10-CM | POA: Diagnosis not present

## 2012-08-08 DIAGNOSIS — H81399 Other peripheral vertigo, unspecified ear: Secondary | ICD-10-CM | POA: Diagnosis not present

## 2012-08-13 ENCOUNTER — Encounter (HOSPITAL_COMMUNITY): Payer: Self-pay

## 2012-08-13 ENCOUNTER — Emergency Department (INDEPENDENT_AMBULATORY_CARE_PROVIDER_SITE_OTHER)
Admission: EM | Admit: 2012-08-13 | Discharge: 2012-08-13 | Disposition: A | Discharge status: Home / Self Care | Payer: Medicare Other | Source: Home / Self Care | Attending: Emergency Medicine | Admitting: Emergency Medicine

## 2012-08-13 DIAGNOSIS — R82998 Other abnormal findings in urine: Secondary | ICD-10-CM

## 2012-08-13 DIAGNOSIS — M549 Dorsalgia, unspecified: Secondary | ICD-10-CM | POA: Diagnosis not present

## 2012-08-13 DIAGNOSIS — R300 Dysuria: Secondary | ICD-10-CM | POA: Diagnosis not present

## 2012-08-13 DIAGNOSIS — R8290 Unspecified abnormal findings in urine: Secondary | ICD-10-CM

## 2012-08-13 LAB — POCT URINALYSIS DIP (DEVICE)
Ketones, ur: NEGATIVE mg/dL
Leukocytes, UA: NEGATIVE
Specific Gravity, Urine: 1.02 (ref 1.005–1.030)
pH: 5.5 (ref 5.0–8.0)

## 2012-08-13 LAB — POCT PREGNANCY, URINE: Preg Test, Ur: NEGATIVE

## 2012-08-13 MED ORDER — NITROFURANTOIN MONOHYD MACRO 100 MG PO CAPS: 100.0000 mg | ORAL_CAPSULE | Freq: Two times a day (BID) | ORAL | Status: AC

## 2012-08-13 NOTE — ED Provider Notes (Signed)
History     CSN: [id number]  Arrival date & time 08/13/12  1043   First MD Initiated Contact with Patient 08/13/12 1123      Chief Complaint  Patient presents with  . Back Pain    (Consider location/radiation/quality/duration/timing/severity/associated sxs/prior treatment) HPI Comments: Patient presents this afternoon to urgent care complaining of a lower back pain this started about 2-3 days ago she admits that she has rheumatoid arthritis and fibromyalgia and somewhat improve as she took some muscle relaxers that she had home. She also describes her last 2 days she's been having increased frequency with urination and pressure mostly felt at the end of urination. She denies any fevers, vomiting or abdominal pain. Chest denies any numbness, tingling sensations or weakness of any of her lower extremities.  Patient is a 41 y.o. female presenting with back pain. The history is provided by the patient.  Back Pain  This is a new problem. The current episode started 2 days ago. The problem has been gradually improving. The pain is associated with no known injury. The pain is present in the lumbar spine. The quality of the pain is described as shooting. The pain is at a severity of 6/10. The pain is moderate. The symptoms are aggravated by twisting, bending and certain positions. Associated symptoms include dysuria. Pertinent negatives include no fever, no numbness, no headaches, no abdominal pain, no abdominal swelling, no perianal numbness, no bladder incontinence, no pelvic pain, no leg pain, no paresthesias, no paresis, no tingling and no weakness. She has tried muscle relaxants and NSAIDs for the symptoms. The treatment provided mild relief.    Past Medical History  Diagnosis Date  . Hypertension   . Fibromyalgia   . Osteoarthritis   . Fibromyalgia   . Rheumatoid arthritis   . Rheumatoid arthritis     History reviewed. No pertinent past surgical history.  History reviewed. No  pertinent family history.  History  Substance Use Topics  . Smoking status: Current Every Day Smoker  . Smokeless tobacco: Not on file  . Alcohol Use: No    OB History    Grav Para Term Preterm Abortions TAB SAB Ect Mult Living                  Review of Systems  Constitutional: Negative for fever, chills, activity change and appetite change.  Gastrointestinal: Negative for abdominal pain.  Genitourinary: Positive for dysuria and frequency. Negative for bladder incontinence, flank pain, decreased urine volume, vaginal bleeding, vaginal discharge, vaginal pain and pelvic pain.  Musculoskeletal: Positive for back pain.  Skin: Negative for rash and wound.  Neurological: Negative for tingling, weakness, numbness, headaches and paresthesias.    Allergies  Review of patient's allergies indicates no known allergies.  Home Medications   Current Outpatient Rx  Name Route Sig Dispense Refill  . HUMIRA PEN [place] Subcutaneous Inject into the skin. One injection every 2 weeks    . AMLODIPINE BESYLATE 10 MG PO TABS Oral Take 10 mg by mouth daily.      [name] DIAZEPAM 10 MG PO TABS Oral Take 10 mg by mouth every 6 (six) hours as needed.      . DULOXETINE HCL 60 MG PO CPEP Oral Take 60 mg by mouth daily.      [name] FOLIC ACID 1 MG PO TABS Oral Take 1 mg by mouth daily.      [name] HYDRALAZINE HCL 25 MG PO TABS Oral Take 25 mg by mouth 3 (three) times  daily.      [name] HYDROCODONE-ACETAMINOPHEN 10-650 MG PO TABS Oral Take 1 tablet by mouth every 6 (six) hours as needed.      . MELOXICAM 15 MG PO TABS Oral Take 15 mg by mouth daily.      [name] METHOTREXATE 2.5 MG PO TABS Oral Take 2.5 mg by mouth once a week. Caution:Chemotherapy. Protect from light.     [name] TIZANIDINE HCL 4 MG PO TABS Oral Take 4 mg by mouth every 6 (six) hours as needed.      [name] NITROFURANTOIN MONOHYD MACRO 100 MG PO CAPS Oral Take 1 capsule (100 mg total) by mouth 2 (two) times daily. 14 capsule 0    BP 137/90  Pulse 85  Temp 98.1 F (36.7 C)  (Oral)  Resp 20  SpO2 100%  LMP 07/23/2012  Physical Exam  Nursing note and vitals reviewed. Constitutional: Vital signs are normal. She appears well-developed and well-nourished.  Non-toxic appearance. She does not have a sickly appearance. She does not appear ill. No distress.  Eyes: Conjunctivae normal are normal.  Abdominal: Soft. She exhibits no distension. There is no tenderness.  Musculoskeletal: She exhibits tenderness.       Lumbar back: She exhibits tenderness. She exhibits normal range of motion, no bony tenderness, no swelling, no edema, no deformity, no laceration, no pain, no spasm and normal pulse.       Back:  Neurological: She is alert.  Skin: No erythema.    ED Course  Procedures (including critical care time)  Labs Reviewed  POCT URINALYSIS DIP (DEVICE) - Abnormal; Notable for the following:    Bilirubin Urine SMALL (*)     Hgb urine dipstick LARGE (*)     All other components within normal limits  POCT PREGNANCY, URINE  URINE CULTURE   No results found.   1. Back pain   2. Dysuria   3. Abnormal urine       MDM  Patient with 2 problems. #1 lower back pain, exam is suggestive of lumbar paravertebral muscular pain problem #2 urinary symptoms patient with abnormal urine dip consistent of large immature. Denies any vaginal bleeding or pain in her active menses. We have sent a urine sample for cultures for further sedation. Patient was provided with a prescription of Macrobid sufficient for 7 days. Pending culture results. Encouraged to continue with her pain management regimen as per her primary care Dr. consists of meloxicam and Lortab, for back pain.     Jimmie Molly, MD 08/13/12 [phone]

## 2012-08-13 NOTE — ED Notes (Signed)
Lower back pain, states pain "shoots up" , started 2-3 days ago, frequent urination

## 2012-08-14 LAB — URINE CULTURE: Colony Count: 9000

## 2012-09-05 DIAGNOSIS — F341 Dysthymic disorder: Secondary | ICD-10-CM | POA: Diagnosis not present

## 2012-09-05 DIAGNOSIS — H81399 Other peripheral vertigo, unspecified ear: Secondary | ICD-10-CM | POA: Diagnosis not present

## 2012-09-05 DIAGNOSIS — G541 Lumbosacral plexus disorders: Secondary | ICD-10-CM | POA: Diagnosis not present

## 2012-09-05 DIAGNOSIS — M25549 Pain in joints of unspecified hand: Secondary | ICD-10-CM | POA: Diagnosis not present

## 2012-09-26 DIAGNOSIS — H81399 Other peripheral vertigo, unspecified ear: Secondary | ICD-10-CM | POA: Diagnosis not present

## 2012-09-26 DIAGNOSIS — F341 Dysthymic disorder: Secondary | ICD-10-CM | POA: Diagnosis not present

## 2012-09-26 DIAGNOSIS — G541 Lumbosacral plexus disorders: Secondary | ICD-10-CM | POA: Diagnosis not present

## 2012-09-26 DIAGNOSIS — M542 Cervicalgia: Secondary | ICD-10-CM | POA: Diagnosis not present

## 2012-09-26 DIAGNOSIS — Z79899 Other long term (current) drug therapy: Secondary | ICD-10-CM | POA: Diagnosis not present

## 2012-09-26 DIAGNOSIS — M25549 Pain in joints of unspecified hand: Secondary | ICD-10-CM | POA: Diagnosis not present

## 2012-10-24 DIAGNOSIS — Z79899 Other long term (current) drug therapy: Secondary | ICD-10-CM | POA: Diagnosis not present

## 2012-10-24 DIAGNOSIS — G541 Lumbosacral plexus disorders: Secondary | ICD-10-CM | POA: Diagnosis not present

## 2012-10-24 DIAGNOSIS — M542 Cervicalgia: Secondary | ICD-10-CM | POA: Diagnosis not present

## 2012-10-24 DIAGNOSIS — F341 Dysthymic disorder: Secondary | ICD-10-CM | POA: Diagnosis not present

## 2012-10-24 DIAGNOSIS — H81399 Other peripheral vertigo, unspecified ear: Secondary | ICD-10-CM | POA: Diagnosis not present

## 2012-10-24 DIAGNOSIS — M25549 Pain in joints of unspecified hand: Secondary | ICD-10-CM | POA: Diagnosis not present

## 2012-10-26 DIAGNOSIS — L03319 Cellulitis of trunk, unspecified: Secondary | ICD-10-CM | POA: Diagnosis not present

## 2012-10-26 DIAGNOSIS — I10 Essential (primary) hypertension: Secondary | ICD-10-CM | POA: Diagnosis not present

## 2012-10-26 DIAGNOSIS — F3289 Other specified depressive episodes: Secondary | ICD-10-CM | POA: Diagnosis not present

## 2012-10-26 DIAGNOSIS — F329 Major depressive disorder, single episode, unspecified: Secondary | ICD-10-CM | POA: Diagnosis not present

## 2012-10-26 DIAGNOSIS — F17200 Nicotine dependence, unspecified, uncomplicated: Secondary | ICD-10-CM | POA: Diagnosis not present

## 2012-10-26 DIAGNOSIS — L02219 Cutaneous abscess of trunk, unspecified: Secondary | ICD-10-CM | POA: Diagnosis not present

## 2012-11-10 DIAGNOSIS — H00019 Hordeolum externum unspecified eye, unspecified eyelid: Secondary | ICD-10-CM | POA: Diagnosis not present

## 2012-11-10 DIAGNOSIS — Z79899 Other long term (current) drug therapy: Secondary | ICD-10-CM | POA: Diagnosis not present

## 2012-11-10 DIAGNOSIS — I10 Essential (primary) hypertension: Secondary | ICD-10-CM | POA: Diagnosis not present

## 2012-11-21 DIAGNOSIS — H81399 Other peripheral vertigo, unspecified ear: Secondary | ICD-10-CM | POA: Diagnosis not present

## 2012-11-21 DIAGNOSIS — F341 Dysthymic disorder: Secondary | ICD-10-CM | POA: Diagnosis not present

## 2012-11-21 DIAGNOSIS — G541 Lumbosacral plexus disorders: Secondary | ICD-10-CM | POA: Diagnosis not present

## 2012-11-21 DIAGNOSIS — M25549 Pain in joints of unspecified hand: Secondary | ICD-10-CM | POA: Diagnosis not present

## 2012-12-14 ENCOUNTER — Emergency Department (HOSPITAL_COMMUNITY)
Admission: EM | Admit: 2012-12-14 | Discharge: 2012-12-14 | Disposition: A | Discharge status: Home / Self Care | Payer: Medicare Other | Attending: Emergency Medicine | Admitting: Emergency Medicine

## 2012-12-14 ENCOUNTER — Encounter (HOSPITAL_COMMUNITY): Payer: Self-pay | Admitting: Emergency Medicine

## 2012-12-14 DIAGNOSIS — M545 Low back pain, unspecified: Secondary | ICD-10-CM | POA: Diagnosis not present

## 2012-12-14 DIAGNOSIS — Z79899 Other long term (current) drug therapy: Secondary | ICD-10-CM | POA: Insufficient documentation

## 2012-12-14 DIAGNOSIS — Z791 Long term (current) use of non-steroidal anti-inflammatories (NSAID): Secondary | ICD-10-CM | POA: Insufficient documentation

## 2012-12-14 DIAGNOSIS — Z3202 Encounter for pregnancy test, result negative: Secondary | ICD-10-CM | POA: Insufficient documentation

## 2012-12-14 DIAGNOSIS — I10 Essential (primary) hypertension: Secondary | ICD-10-CM | POA: Diagnosis not present

## 2012-12-14 DIAGNOSIS — IMO0001 Reserved for inherently not codable concepts without codable children: Secondary | ICD-10-CM | POA: Diagnosis not present

## 2012-12-14 DIAGNOSIS — M069 Rheumatoid arthritis, unspecified: Secondary | ICD-10-CM | POA: Insufficient documentation

## 2012-12-14 DIAGNOSIS — F17200 Nicotine dependence, unspecified, uncomplicated: Secondary | ICD-10-CM | POA: Insufficient documentation

## 2012-12-14 DIAGNOSIS — M549 Dorsalgia, unspecified: Secondary | ICD-10-CM

## 2012-12-14 DIAGNOSIS — L732 Hidradenitis suppurativa: Secondary | ICD-10-CM | POA: Diagnosis not present

## 2012-12-14 DIAGNOSIS — L0291 Cutaneous abscess, unspecified: Secondary | ICD-10-CM | POA: Diagnosis not present

## 2012-12-14 LAB — COMPREHENSIVE METABOLIC PANEL
ALT: 14 U/L (ref 0–35)
AST: 15 U/L (ref 0–37)
Alkaline Phosphatase: 97 U/L (ref 39–117)
CO2: 24 mEq/L (ref 19–32)
GFR calc Af Amer: >90 mL/min (ref >90)
GFR calc non Af Amer: >90 mL/min (ref >90)
Glucose, Bld: 102 mg/dL — ABNORMAL HIGH (ref 70–99)
Potassium: 3.5 mEq/L (ref 3.5–5.1)
Sodium: 138 mEq/L (ref 135–145)
Total Protein: 9 g/dL — ABNORMAL HIGH (ref 6.0–8.3)

## 2012-12-14 LAB — URINALYSIS, ROUTINE W REFLEX MICROSCOPIC
Bilirubin Urine: NEGATIVE
Glucose, UA: NEGATIVE mg/dL
Ketones, ur: NEGATIVE mg/dL
Protein, ur: NEGATIVE mg/dL
pH: 5.5 (ref 5.0–8.0)

## 2012-12-14 LAB — URINE MICROSCOPIC-ADD ON

## 2012-12-14 LAB — CBC WITH DIFFERENTIAL/PLATELET
Basophils Absolute: 0 10*3/uL (ref 0.0–0.1)
Lymphocytes Relative: 26 % (ref 12–46)
Lymphs Abs: 2.6 10*3/uL (ref 0.7–4.0)
Neutrophils Relative %: 67 % (ref 43–77)
Platelets: 436 10*3/uL — ABNORMAL HIGH (ref 150–400)
RBC: 4.19 MIL/uL (ref 3.87–5.11)
RDW: 16.6 % — ABNORMAL HIGH (ref 11.5–15.5)
WBC: 9.8 10*3/uL (ref 4.0–10.5)

## 2012-12-14 MED ORDER — HYDROMORPHONE HCL PF 1 MG/ML IJ SOLN
1.0000 mg | Freq: Once | INTRAMUSCULAR | Status: AC
  Administered 2012-12-14: 1 mg via INTRAMUSCULAR
  Filled 2012-12-14: qty 1

## 2012-12-14 NOTE — ED Provider Notes (Signed)
History     CSN: [id number]  Arrival date & time 12/14/12  Ernestina Columbia   First MD Initiated Contact with Patient 12/14/12 2116      Chief Complaint  Patient presents with  . Back Pain    (Consider location/radiation/quality/duration/timing/severity/associated sxs/prior treatment) HPI  42 year old female with history of rheumatoid arthritis, osteoarthritis, and fibromyalgia presents complaining of low back pain. Patient reports gradual onset of pain to her low back which started since yesterday. Pain is described as a pounding achy sensation radiating across her low back. Pain worsened when she stands for long periods of time and also when she urinates. She reports having urinary discomfort including burning urination. She has tried taking some Lortab with minimal relief. Nothing seems to make the pain better. She denies fever, chills, nausea, vomiting, diarrhea, abdominal pain, or hip pain. She denies any vaginal discharge, or rash. She denies any recent trauma, heavy lifting, or strenuous exercise. Pt believes she is developing a UTI. She has recently finished her menstruation since yesterday.    Past Medical History  Diagnosis Date  . Hypertension   . Fibromyalgia   . Osteoarthritis   . Fibromyalgia   . Rheumatoid arthritis   . Rheumatoid arthritis     History reviewed. No pertinent past surgical history.  No family history on file.  History  Substance Use Topics  . Smoking status: Current Every Day Smoker  . Smokeless tobacco: Not on file  . Alcohol Use: No    OB History   Grav Para Term Preterm Abortions TAB SAB Ect Mult Living                  Review of Systems  Constitutional:       10 Systems reviewed and all are negative for acute change except as noted in the HPI.     Allergies  Review of patient's allergies indicates no known allergies.  Home Medications   Current Outpatient Rx  Name  Route  Sig  Dispense  Refill  . acetaminophen (TYLENOL) 325 MG tablet    Oral   Take 650 mg by mouth every 6 (six) hours as needed for pain.         . Adalimumab (HUMIRA PEN [place])   Subcutaneous   Inject into the skin. One injection every 2 weeks         . amlodipine-olmesartan (AZOR) 10-20 MG per tablet   Oral   Take 1 tablet by mouth daily.         . diazepam (VALIUM) 10 MG tablet   Oral   Take 10 mg by mouth every 6 (six) hours as needed for anxiety.          . DULoxetine (CYMBALTA) 60 MG capsule   Oral   Take 60 mg by mouth daily.           [name] HYDROcodone-acetaminophen (NORCO) 10-325 MG per tablet   Oral   Take 1 tablet by mouth every 6 (six) hours as needed for pain.         [name] ibuprofen (ADVIL,MOTRIN) 200 MG tablet   Oral   Take 400 mg by mouth every 6 (six) hours as needed for pain.         . meloxicam (MOBIC) 15 MG tablet   Oral   Take 15 mg by mouth daily.           . methotrexate (RHEUMATREX) 2.5 MG tablet   Oral   Take 20 mg by mouth  once a week. Caution:Chemotherapy. Protect from light.         [name] tiZANidine (ZANAFLEX) 4 MG tablet   Oral   Take 4 mg by mouth every 6 (six) hours as needed (for muscle spasms).            BP 133/81  Pulse 98  Temp(Src) 97.6 F (36.4 C) (Oral)  Resp 20  SpO2 99%  LMP 12/10/2012  Physical Exam  Nursing note and vitals reviewed. Constitutional: She appears well-developed and well-nourished.  Moderately obese, nontoxic in appearance, in no acute distress.  HENT:  Head: Atraumatic.  Eyes: Conjunctivae are normal.  Neck: Neck supple.  Abdominal: Soft. There is no tenderness. There is no rebound and no guarding.  No CVA tenderness  Musculoskeletal: Normal range of motion. She exhibits no edema.  No midline spine tenderness. Tenderness to bilateral para lumbar region on palpation without overlying skin changes. Increasing pain with lumbar flexion and extension and lateral rotation.  Neurological: She is alert.  Skin: Skin is warm. No rash noted.    ED Course  Procedures  (including critical care time)  Labs Reviewed  URINALYSIS, ROUTINE W REFLEX MICROSCOPIC - Abnormal; Notable for the following:    Hgb urine dipstick SMALL (*)    Leukocytes, UA SMALL (*)    All other components within normal limits  CBC WITH DIFFERENTIAL - Abnormal; Notable for the following:    HCT 34.7 (*)    RDW 16.6 (*)    Platelets 436 (*)    All other components within normal limits  COMPREHENSIVE METABOLIC PANEL - Abnormal; Notable for the following:    Glucose, Bld 102 (*)    Total Protein 9.0 (*)    Total Bilirubin 0.2 (*)    All other components within normal limits  URINE MICROSCOPIC-ADD ON  POCT PREGNANCY, URINE   No results found.  Results for orders placed during the hospital encounter of 12/14/12  URINALYSIS, ROUTINE W REFLEX MICROSCOPIC      Result Value Range   Color, Urine YELLOW  YELLOW   APPearance CLEAR  CLEAR   Specific Gravity, Urine 1.022  1.005 - 1.030   pH 5.5  5.0 - 8.0   Glucose, UA NEGATIVE  NEGATIVE mg/dL   Hgb urine dipstick SMALL (*) NEGATIVE   Bilirubin Urine NEGATIVE  NEGATIVE   Ketones, ur NEGATIVE  NEGATIVE mg/dL   Protein, ur NEGATIVE  NEGATIVE mg/dL   Urobilinogen, UA 1.0  0.0 - 1.0 mg/dL   Nitrite NEGATIVE  NEGATIVE   Leukocytes, UA SMALL (*) NEGATIVE  CBC WITH DIFFERENTIAL      Result Value Range   WBC 9.8  4.0 - 10.5 K/uL   RBC 4.19  3.87 - 5.11 MIL/uL   Hemoglobin 12.4  12.0 - 15.0 g/dL   HCT 16.1 (*) 09.6 - 04.5 %   MCV 82.8  78.0 - 100.0 fL   MCH 29.6  26.0 - 34.0 pg   MCHC 35.7  30.0 - 36.0 g/dL   RDW 40.9 (*) 81.1 - 91.4 %   Platelets 436 (*) 150 - 400 K/uL   Neutrophils Relative 67  43 - 77 %   Neutro Abs 6.6  1.7 - 7.7 K/uL   Lymphocytes Relative 26  12 - 46 %   Lymphs Abs 2.6  0.7 - 4.0 K/uL   Monocytes Relative 4  3 - 12 %   Monocytes Absolute 0.4  0.1 - 1.0 K/uL   Eosinophils Relative 2  0 - 5 %  Eosinophils Absolute 0.2  0.0 - 0.7 K/uL   Basophils Relative 0  0 - 1 %   Basophils Absolute 0.0  0.0 - 0.1 K/uL   COMPREHENSIVE METABOLIC PANEL      Result Value Range   Sodium 138  135 - 145 mEq/L   Potassium 3.5  3.5 - 5.1 mEq/L   Chloride 103  96 - 112 mEq/L   CO2 24  19 - 32 mEq/L   Glucose, Bld 102 (*) 70 - 99 mg/dL   BUN 9  6 - 23 mg/dL   Creatinine, Ser 1.61  0.50 - 1.10 mg/dL   Calcium 9.8  8.4 - 09.6 mg/dL   Total Protein 9.0 (*) 6.0 - 8.3 g/dL   Albumin 3.6  3.5 - 5.2 g/dL   AST 15  0 - 37 U/L   ALT 14  0 - 35 U/L   Alkaline Phosphatase 97  39 - 117 U/L   Total Bilirubin 0.2 (*) 0.3 - 1.2 mg/dL   GFR calc non Af Amer >90  >90 mL/min   GFR calc Af Amer >90  >90 mL/min  URINE MICROSCOPIC-ADD ON      Result Value Range   Squamous Epithelial / LPF RARE  RARE   WBC, UA 0-2  <3 WBC/hpf   RBC / HPF 0-2  <3 RBC/hpf  POCT PREGNANCY, URINE      Result Value Range   Preg Test, Ur NEGATIVE  NEGATIVE   No results found.   No diagnosis found.  9:52 PM Patient was seen and evaluated by me for her complaint of back pain. She does have significant history of fibromyalgia, osteoarthritis, and rheumatoid arthritis.  Her back pain is reproducible but she has no recent trauma worrisome for fx or dislocation.  No xray warranted.  Pt c/o dysuria.  UA unremarkable, urine culture sent.  Pt has many pain medications at home, plan to provide pain management here only and to have her f/u with her PCP for further care.    BP 133/81  Pulse 98  Temp(Src) 97.6 F (36.4 C) (Oral)  Resp 20  SpO2 99%  LMP 12/10/2012  I have reviewed nursing notes and vital signs.  I reviewed available ER/hospitalization records thought the EMR  1. Back pain MDM          Fayrene Helper, PA-C 12/14/12 2235

## 2012-12-14 NOTE — ED Provider Notes (Signed)
Medical screening examination/treatment/procedure(s) were performed by non-physician practitioner and as supervising physician I was immediately available for consultation/collaboration.   David H Yao, MD 12/14/12 2329 

## 2012-12-14 NOTE — ED Notes (Signed)
Pt discharged.Vital signs stable and GCS 15 

## 2012-12-14 NOTE — ED Notes (Signed)
PT. REPORTS LOW BACK PAIN RADIATING TO ,LOWER ABDOMEN ONSET YESTERDAY , DENIES DYSURIA OR INJURY . NO NAUSEA OR VOMITTING .

## 2012-12-19 DIAGNOSIS — F341 Dysthymic disorder: Secondary | ICD-10-CM | POA: Diagnosis not present

## 2012-12-19 DIAGNOSIS — G541 Lumbosacral plexus disorders: Secondary | ICD-10-CM | POA: Diagnosis not present

## 2012-12-19 DIAGNOSIS — H81399 Other peripheral vertigo, unspecified ear: Secondary | ICD-10-CM | POA: Diagnosis not present

## 2012-12-19 DIAGNOSIS — M25549 Pain in joints of unspecified hand: Secondary | ICD-10-CM | POA: Diagnosis not present

## 2012-12-28 ENCOUNTER — Encounter (HOSPITAL_COMMUNITY): Payer: Self-pay | Admitting: Emergency Medicine

## 2012-12-28 ENCOUNTER — Emergency Department (HOSPITAL_COMMUNITY): Payer: Medicare Other

## 2012-12-28 ENCOUNTER — Emergency Department (HOSPITAL_COMMUNITY)
Admission: EM | Admit: 2012-12-28 | Discharge: 2012-12-28 | Disposition: A | Discharge status: Home / Self Care | Payer: Medicare Other | Attending: Emergency Medicine | Admitting: Emergency Medicine

## 2012-12-28 DIAGNOSIS — R059 Cough, unspecified: Secondary | ICD-10-CM | POA: Insufficient documentation

## 2012-12-28 DIAGNOSIS — N390 Urinary tract infection, site not specified: Secondary | ICD-10-CM

## 2012-12-28 DIAGNOSIS — R609 Edema, unspecified: Secondary | ICD-10-CM

## 2012-12-28 DIAGNOSIS — R0602 Shortness of breath: Secondary | ICD-10-CM | POA: Diagnosis not present

## 2012-12-28 DIAGNOSIS — R091 Pleurisy: Secondary | ICD-10-CM

## 2012-12-28 DIAGNOSIS — R000 Tachycardia, unspecified: Secondary | ICD-10-CM | POA: Diagnosis not present

## 2012-12-28 DIAGNOSIS — Z791 Long term (current) use of non-steroidal anti-inflammatories (NSAID): Secondary | ICD-10-CM | POA: Diagnosis not present

## 2012-12-28 DIAGNOSIS — M069 Rheumatoid arthritis, unspecified: Secondary | ICD-10-CM | POA: Insufficient documentation

## 2012-12-28 DIAGNOSIS — R079 Chest pain, unspecified: Secondary | ICD-10-CM | POA: Diagnosis not present

## 2012-12-28 DIAGNOSIS — F17200 Nicotine dependence, unspecified, uncomplicated: Secondary | ICD-10-CM | POA: Insufficient documentation

## 2012-12-28 DIAGNOSIS — Z3202 Encounter for pregnancy test, result negative: Secondary | ICD-10-CM | POA: Insufficient documentation

## 2012-12-28 DIAGNOSIS — I10 Essential (primary) hypertension: Secondary | ICD-10-CM | POA: Insufficient documentation

## 2012-12-28 DIAGNOSIS — Z79899 Other long term (current) drug therapy: Secondary | ICD-10-CM | POA: Diagnosis not present

## 2012-12-28 DIAGNOSIS — M549 Dorsalgia, unspecified: Secondary | ICD-10-CM | POA: Insufficient documentation

## 2012-12-28 DIAGNOSIS — Z8739 Personal history of other diseases of the musculoskeletal system and connective tissue: Secondary | ICD-10-CM | POA: Insufficient documentation

## 2012-12-28 DIAGNOSIS — M79609 Pain in unspecified limb: Secondary | ICD-10-CM | POA: Diagnosis not present

## 2012-12-28 DIAGNOSIS — M1990 Unspecified osteoarthritis, unspecified site: Secondary | ICD-10-CM | POA: Insufficient documentation

## 2012-12-28 LAB — POCT I-STAT TROPONIN I: Troponin i, poc: 0 ng/mL (ref 0.00–0.08)

## 2012-12-28 LAB — URINALYSIS, ROUTINE W REFLEX MICROSCOPIC
Bilirubin Urine: NEGATIVE
Ketones, ur: NEGATIVE mg/dL
Nitrite: NEGATIVE
Urobilinogen, UA: 0.2 mg/dL (ref 0.0–1.0)
pH: 5 (ref 5.0–8.0)

## 2012-12-28 LAB — CBC WITH DIFFERENTIAL/PLATELET
Basophils Absolute: 0 10*3/uL (ref 0.0–0.1)
Basophils Relative: 0 % (ref 0–1)
Eosinophils Absolute: 0.1 10*3/uL (ref 0.0–0.7)
Eosinophils Relative: 1 % (ref 0–5)
MCH: 28.6 pg (ref 26.0–34.0)
MCV: 82.2 fL (ref 78.0–100.0)
Platelets: 406 10*3/uL — ABNORMAL HIGH (ref 150–400)
RDW: 16.9 % — ABNORMAL HIGH (ref 11.5–15.5)

## 2012-12-28 LAB — D-DIMER, QUANTITATIVE: D-Dimer, Quant: 1.02 ug/mL-FEU — ABNORMAL HIGH (ref 0.00–0.48)

## 2012-12-28 LAB — POCT I-STAT, CHEM 8
Hemoglobin: 12.9 g/dL (ref 12.0–15.0)
Sodium: 139 mEq/L (ref 135–145)
TCO2: 23 mmol/L (ref 0–100)

## 2012-12-28 LAB — URINE MICROSCOPIC-ADD ON

## 2012-12-28 MED ORDER — NAPROXEN 375 MG PO TABS: 375.0000 mg | ORAL_TABLET | Freq: Two times a day (BID) | ORAL | Status: DC

## 2012-12-28 MED ORDER — SODIUM CHLORIDE 0.9 % IV BOLUS (SEPSIS)
1000.0000 mL | Freq: Once | INTRAVENOUS | Status: AC
  Administered 2012-12-28: 1000 mL via INTRAVENOUS

## 2012-12-28 MED ORDER — KETOROLAC TROMETHAMINE 30 MG/ML IJ SOLN
30.0000 mg | Freq: Once | INTRAMUSCULAR | Status: AC
  Administered 2012-12-28: 30 mg via INTRAVENOUS
  Filled 2012-12-28: qty 1

## 2012-12-28 MED ORDER — MORPHINE SULFATE 2 MG/ML IJ SOLN
2.0000 mg | Freq: Once | INTRAMUSCULAR | Status: AC
  Administered 2012-12-28: 2 mg via INTRAVENOUS
  Filled 2012-12-28: qty 1

## 2012-12-28 MED ORDER — HYDROCODONE-ACETAMINOPHEN 5-325 MG PO TABS
2.0000 | ORAL_TABLET | Freq: Once | ORAL | Status: AC
  Administered 2012-12-28: 2 via ORAL
  Filled 2012-12-28: qty 2

## 2012-12-28 MED ORDER — NITROFURANTOIN MONOHYD MACRO 100 MG PO CAPS: 100.0000 mg | ORAL_CAPSULE | Freq: Two times a day (BID) | ORAL | Status: DC

## 2012-12-28 MED ORDER — IOHEXOL 350 MG/ML SOLN
100.0000 mL | Freq: Once | INTRAVENOUS | Status: AC | PRN
  Administered 2012-12-28: 100 mL via INTRAVENOUS

## 2012-12-28 NOTE — ED Notes (Signed)
MD at bedside. Dr Beaton 

## 2012-12-28 NOTE — ED Notes (Signed)
Care transferred, report received Cynthia Roughgarden, RN. 

## 2012-12-28 NOTE — Progress Notes (Signed)
VASCULAR LAB PRELIMINARY  PRELIMINARY  PRELIMINARY  PRELIMINARY  Bilateral lower extremity venous duplex completed.    Preliminary report: Technically difficult exam due to patient guarding and body habitus.  No obvious Deep or superficial vein thrombosis noted in the visualized veins.   NICHOLS, FRANCES, RVT 12/28/2012, 11:13 AM

## 2012-12-28 NOTE — ED Notes (Signed)
To doppler 

## 2012-12-28 NOTE — ED Provider Notes (Signed)
History     CSN: [id number]  Arrival date & time 12/28/12  0145   First MD Initiated Contact with Patient 12/28/12 [phone]      Chief Complaint  Patient presents with  . Shortness of Breath  . Back Pain    (Consider location/radiation/quality/duration/timing/severity/associated sxs/prior treatment) Patient is a 42 y.o. female presenting with shortness of breath and back pain. The history is provided by the patient. No language interpreter was used.  Shortness of Breath Severity:  Moderate Onset quality:  Gradual Timing:  Constant Progression:  Unchanged Chronicity:  New Context: URI   Relieved by:  Nothing Worsened by:  Nothing tried Ineffective treatments:  None tried Associated symptoms: cough   Associated symptoms: no abdominal pain, no chest pain, no fever and no neck pain   Associated symptoms comment:  Upper back pain Bilaterally for several days Risk factors: no recent surgery   Back Pain Pain location: posterior and axillary rib cage B. Quality:  Stabbing Radiates to:  Does not radiate Pain severity:  Severe Pain is:  Same all the time Onset quality:  Gradual Timing:  Constant Progression:  Unchanged Chronicity:  New Context: not MVA   Relieved by:  Nothing Worsened by:  Nothing tried Ineffective treatments:  None tried Associated symptoms: no abdominal pain, no chest pain, no fever, no numbness and no weakness     Past Medical History  Diagnosis Date  . Hypertension   . Fibromyalgia   . Osteoarthritis   . Fibromyalgia   . Rheumatoid arthritis   . Rheumatoid arthritis     History reviewed. No pertinent past surgical history.  No family history on file.  History  Substance Use Topics  . Smoking status: Current Every Day Smoker  . Smokeless tobacco: Not on file  . Alcohol Use: No    OB History   Grav Para Term Preterm Abortions TAB SAB Ect Mult Living                  Review of Systems  Constitutional: Negative for fever.  HENT: Negative  for neck pain.   Respiratory: Positive for cough and shortness of breath.   Cardiovascular: Negative for chest pain and palpitations.  Gastrointestinal: Negative for abdominal pain.  Genitourinary: Negative for difficulty urinating.  Musculoskeletal: Positive for back pain.  Neurological: Negative for weakness and numbness.  All other systems reviewed and are negative.    Allergies  Review of patient's allergies indicates no known allergies.  Home Medications   Current Outpatient Rx  Name  Route  Sig  Dispense  Refill  . Adalimumab (HUMIRA PEN [place])   Subcutaneous   Inject into the skin. One injection every 2 weeks         . amlodipine-olmesartan (AZOR) 10-20 MG per tablet   Oral   Take 1 tablet by mouth daily.         . cephALEXin (KEFLEX) 500 MG capsule   Oral   Take 500 mg by mouth 3 (three) times daily. 30 day treatment foliculitis         . diazepam (VALIUM) 10 MG tablet   Oral   Take 10 mg by mouth every 6 (six) hours as needed for anxiety.          . DULoxetine (CYMBALTA) 60 MG capsule   Oral   Take 60 mg by mouth daily.           [name] HYDROcodone-acetaminophen (NORCO) 10-325 MG per tablet   Oral  Take 1 tablet by mouth every 6 (six) hours as needed for pain.         [name] ibuprofen (ADVIL,MOTRIN) 200 MG tablet   Oral   Take 400 mg by mouth every 6 (six) hours as needed for pain.         . meloxicam (MOBIC) 15 MG tablet   Oral   Take 15 mg by mouth daily.           . methotrexate (RHEUMATREX) 2.5 MG tablet   Oral   Take 20 mg by mouth once a week. Caution:Chemotherapy. Protect from light.         [name] tiZANidine (ZANAFLEX) 4 MG tablet   Oral   Take 4 mg by mouth every 6 (six) hours as needed (for muscle spasms).          [name] acetaminophen (TYLENOL) 325 MG tablet   Oral   Take 650 mg by mouth every 6 (six) hours as needed for pain.           BP 118/79  Pulse 104  Temp(Src) 97.4 F (36.3 C)  Resp 23  SpO2 99%  LMP  12/10/2012  Physical Exam  Constitutional: She is oriented to person, place, and time. She appears well-developed and well-nourished. No distress.  HENT:  Head: Normocephalic and atraumatic.  Mouth/Throat: Oropharynx is clear and moist.  Eyes: Conjunctivae are normal. Pupils are equal, round, and reactive to light.  Neck: Normal range of motion. Neck supple.  Cardiovascular: Intact distal pulses.  Tachycardia present.   Pulmonary/Chest: Effort normal and breath sounds normal. No stridor. She has no wheezes. She has no rales.  Abdominal: Soft. Bowel sounds are normal. There is no tenderness. There is no rebound and no guarding.  Musculoskeletal: Normal range of motion.  Neurological: She is alert and oriented to person, place, and time.  Skin: Skin is warm and dry.  Psychiatric: She has a normal mood and affect.    ED Course  Procedures (including critical care time)  Labs Reviewed  CBC WITH DIFFERENTIAL - Abnormal; Notable for the following:    WBC 11.7 (*)    RDW 16.9 (*)    Platelets 406 (*)    Neutro Abs 8.5 (*)    Monocytes Relative 2 (*)    All other components within normal limits  URINALYSIS, ROUTINE W REFLEX MICROSCOPIC - Abnormal; Notable for the following:    Hgb urine dipstick LARGE (*)    Leukocytes, UA MODERATE (*)    All other components within normal limits  URINE MICROSCOPIC-ADD ON - Abnormal; Notable for the following:    Squamous Epithelial / LPF FEW (*)    Casts HYALINE CASTS (*)    All other components within normal limits  POCT I-STAT, CHEM 8 - Abnormal; Notable for the following:    Glucose, Bld 110 (*)    All other components within normal limits  POCT I-STAT TROPONIN I  POCT PREGNANCY, URINE   Dg Chest 2 View  12/28/2012  *RADIOLOGY REPORT*  Clinical Data: Shortness of, chest pain  CHEST - 2 VIEW  Comparison: 04/14/2011  Findings: Hypoaeration.  Linear right lung base opacity and associated elevation of the right hemidiaphragm.  Cardiomediastinal  contours within normal range.  No pleural effusion or pneumothorax. Curvature of the spine.  No acute osseous finding.  IMPRESSION: Linear right lung base opacity, favor atelectasis.   Original Report Authenticated By: Jearld Lesch, M.D.      No diagnosis found.  MDM   Date: 12/28/2012  Rate: 96  Rhythm: normal sinus rhythm  QRS Axis: normal  Intervals: normal  ST/T Wave abnormalities: normal  Conduction Disutrbances: none  Narrative Interpretation: unremarkable     Signed out to Dr. Radford Pax pending results of DDIMEr       April K Palumbo-Rasch, MD 12/28/12 2312

## 2012-12-28 NOTE — ED Notes (Signed)
PT. REPORTS SOB WITH UPPER/MID BACK PAIN AND PRODUCTIVE COUGH FOR SEVERAL DAYS .

## 2012-12-28 NOTE — ED Notes (Signed)
Back from xray states pain coming back pt states not on period denies vag d/c

## 2013-01-08 DIAGNOSIS — M069 Rheumatoid arthritis, unspecified: Secondary | ICD-10-CM | POA: Diagnosis not present

## 2013-01-08 DIAGNOSIS — Z79899 Other long term (current) drug therapy: Secondary | ICD-10-CM | POA: Diagnosis not present

## 2013-01-15 DIAGNOSIS — G541 Lumbosacral plexus disorders: Secondary | ICD-10-CM | POA: Diagnosis not present

## 2013-01-15 DIAGNOSIS — F341 Dysthymic disorder: Secondary | ICD-10-CM | POA: Diagnosis not present

## 2013-01-15 DIAGNOSIS — M25549 Pain in joints of unspecified hand: Secondary | ICD-10-CM | POA: Diagnosis not present

## 2013-01-15 DIAGNOSIS — H81399 Other peripheral vertigo, unspecified ear: Secondary | ICD-10-CM | POA: Diagnosis not present

## 2013-01-15 DIAGNOSIS — M542 Cervicalgia: Secondary | ICD-10-CM | POA: Diagnosis not present

## 2013-01-15 DIAGNOSIS — Z79899 Other long term (current) drug therapy: Secondary | ICD-10-CM | POA: Diagnosis not present

## 2013-01-31 ENCOUNTER — Emergency Department (INDEPENDENT_AMBULATORY_CARE_PROVIDER_SITE_OTHER)
Admission: EM | Admit: 2013-01-31 | Discharge: 2013-01-31 | Disposition: A | Discharge status: Home / Self Care | Payer: Medicare Other | Source: Home / Self Care | Attending: Emergency Medicine | Admitting: Emergency Medicine

## 2013-01-31 ENCOUNTER — Encounter (HOSPITAL_COMMUNITY): Payer: Self-pay | Admitting: Emergency Medicine

## 2013-01-31 DIAGNOSIS — M549 Dorsalgia, unspecified: Secondary | ICD-10-CM | POA: Diagnosis not present

## 2013-01-31 LAB — POCT URINALYSIS DIP (DEVICE)
Glucose, UA: NEGATIVE mg/dL
Ketones, ur: NEGATIVE mg/dL
Protein, ur: NEGATIVE mg/dL
Specific Gravity, Urine: 1.02 (ref 1.005–1.030)

## 2013-01-31 LAB — POCT PREGNANCY, URINE: Preg Test, Ur: NEGATIVE

## 2013-01-31 MED ORDER — KETOROLAC TROMETHAMINE 60 MG/2ML IM SOLN: 60.0000 mg | Freq: Once | INTRAMUSCULAR | Status: DC

## 2013-01-31 MED ORDER — CEPHALEXIN 500 MG PO CAPS: 500.0000 mg | ORAL_CAPSULE | Freq: Three times a day (TID) | ORAL | Status: DC

## 2013-01-31 MED ORDER — METHOCARBAMOL 500 MG PO TABS: 500.0000 mg | ORAL_TABLET | Freq: Three times a day (TID) | ORAL | Status: DC

## 2013-01-31 MED ORDER — KETOROLAC TROMETHAMINE 60 MG/2ML IM SOLN
INTRAMUSCULAR | Status: AC
  Filled 2013-01-31: qty 2

## 2013-01-31 NOTE — ED Provider Notes (Signed)
Chief Complaint:   Chief Complaint  Patient presents with  . Urinary Tract Infection    History of Present Illness:   Shelby Robertson is a 42 year old female with rheumatoid arthritis who presents with a four-day history of pain in her bilateral lower back which radiates around to both flank areas to the lower abdomen. This is rated 6/10 in intensity. She denies any injury or precipitating factors. The pain is worse if she has to hold her urine, bends, or twists. The pain is better if she lies still, passes urine, or takes pain meds. She denies any radiation to the legs, numbness, tingling, or muscle weakness in lower extremities. She has had urinary urgency, frequency, occasional urgency incontinence, and urine older over the last 4 days. She denies any hematuria. She's had multiple urinary tract infections before and this is what it feels like. Her last urinary tract infection was 4 months ago. She was hospitalized for an episode of acute pyelonephritis about 4 years ago. She denies any fever, chills, headache, nausea, or vomiting. She denies any GYN complaints.  Review of Systems:  Other than noted above, the patient denies any of the following symptoms: Systemic:  No fever, chills, severe fatigue, or unexplained weight loss. GI:  No abdominal pain, nausea, vomiting, diarrhea, constipation, incontinence of bowel, or blood in stool. GU:  No dysuria, frequency, urgency, or hematuria. No incontinence of urine or difficulty urinating.  M-S:  No neck pain, joint pain, arthritis, or myalgias. Neuro:  No paresthesias, saddle anesthesia, muscular weakness, or progressive neurological deficit.  PMFSH:  Past medical history, family history, social history, meds, and allergies were reviewed. Specifically, there is no history of cancer, major trauma, osteoporosis, immunosuppression, HIV, or IV or injection drug use. She has rheumatoid arthritis, fibromyalgia, osteoarthritis, and hypertension. She takes  hydrocodone, Azore, meloxicam, Robaxin, hydrochlorothiazide, methotrexate, Humira, and Cymbalta.  Physical Exam:   Vital signs:  BP 102/74  Pulse 100  Temp(Src) 97.7 F (36.5 C) (Oral)  Resp 16  SpO2 99%  LMP 01/09/2013 General:  Alert, oriented, in no distress. Abdomen:  Soft, non-tender.  No organomegaly or mass.  No pulsatile midline abdominal mass or bruit. Back:  There was pain to palpation in the lower back just above the iliac crests bilaterally. There was no pain in the sacroiliac area or higher up in the back. The back had a full range of motion with little pain. Straight leg raising was negative bilaterally. Neuro:  Normal muscle strength, sensations and DTRs. Extremities: Pedal pulses were full, there was no edema. Skin:  Clear, warm and dry.  No rash.  Labs:   Results for orders placed during the hospital encounter of 01/31/13  POCT URINALYSIS DIP (DEVICE)      Result Value Range   Glucose, UA NEGATIVE  NEGATIVE mg/dL   Bilirubin Urine SMALL (*) NEGATIVE   Ketones, ur NEGATIVE  NEGATIVE mg/dL   Specific Gravity, Urine 1.020  1.005 - 1.030   Hgb urine dipstick NEGATIVE  NEGATIVE   pH 6.0  5.0 - 8.0   Protein, ur NEGATIVE  NEGATIVE mg/dL   Urobilinogen, UA 1.0  0.0 - 1.0 mg/dL   Nitrite NEGATIVE  NEGATIVE   Leukocytes, UA NEGATIVE  NEGATIVE  POCT PREGNANCY, URINE      Result Value Range   Preg Test, Ur NEGATIVE  NEGATIVE    Other Labs Obtained at Urgent Care Center:  A urine culture was obtained.  Results are pending at this time and we will call  about any positive results.  Assessment:  The encounter diagnosis was Back pain.  This could be urinary tract infection, however her UA clear. A urine culture has been obtained and she was started on Keflex pending results. She's to return if no better in 3 or 4 days or if she should get worse in any way. Other possibilities would include lumbar sprain or strain, arthritis, or disc related pain such as bulging disc or  HNP.  Plan:   1.  The following meds were prescribed:   Discharge Medication List as of 01/31/2013  5:14 PM    START taking these medications   Details  !! cephALEXin (KEFLEX) 500 MG capsule Take 1 capsule (500 mg total) by mouth 3 (three) times daily., Starting 01/31/2013, Until Discontinued, Normal    methocarbamol (ROBAXIN) 500 MG tablet Take 1 tablet (500 mg total) by mouth 3 (three) times daily., Starting 01/31/2013, Until Discontinued, Normal     !! - Potential duplicate medications found. Please discuss with provider.     2.  The patient was instructed in symptomatic care and handouts were given. 3.  The patient was told to return if becoming worse in any way, if no better in 2 weeks, and given some red flag symptoms including fever, worsening pain, or vomiting that would indicate earlier return. 4.  The patient was encouraged to try to be as active as possible and given some exercises to do followed by moist heat.    Reuben Likes, MD 01/31/13 [phone]

## 2013-01-31 NOTE — ED Notes (Signed)
Pt is here for poss UTI onset 2 days Sx include: pelvic pressure, lower back pain, urinary freq/urgency Denies: f/v/n/d Hx of UTI's  She is alert and oriented w/no signs of acute distress.

## 2013-02-02 LAB — URINE CULTURE

## 2013-02-02 NOTE — ED Notes (Signed)
Urine culture: 10,000 colonies E. Coli. Pt. adequately treated with Keflex. Vassie Moselle 02/02/2013

## 2013-02-13 DIAGNOSIS — R269 Unspecified abnormalities of gait and mobility: Secondary | ICD-10-CM | POA: Diagnosis not present

## 2013-02-13 DIAGNOSIS — H81399 Other peripheral vertigo, unspecified ear: Secondary | ICD-10-CM | POA: Diagnosis not present

## 2013-02-13 DIAGNOSIS — G541 Lumbosacral plexus disorders: Secondary | ICD-10-CM | POA: Diagnosis not present

## 2013-02-13 DIAGNOSIS — F341 Dysthymic disorder: Secondary | ICD-10-CM | POA: Diagnosis not present

## 2013-02-13 DIAGNOSIS — H8190 Unspecified disorder of vestibular function, unspecified ear: Secondary | ICD-10-CM | POA: Diagnosis not present

## 2013-02-13 DIAGNOSIS — M25549 Pain in joints of unspecified hand: Secondary | ICD-10-CM | POA: Diagnosis not present

## 2013-02-14 DIAGNOSIS — M069 Rheumatoid arthritis, unspecified: Secondary | ICD-10-CM | POA: Diagnosis not present

## 2013-02-14 DIAGNOSIS — IMO0001 Reserved for inherently not codable concepts without codable children: Secondary | ICD-10-CM | POA: Diagnosis not present

## 2013-02-14 DIAGNOSIS — Z09 Encounter for follow-up examination after completed treatment for conditions other than malignant neoplasm: Secondary | ICD-10-CM | POA: Diagnosis not present

## 2013-02-14 DIAGNOSIS — F17200 Nicotine dependence, unspecified, uncomplicated: Secondary | ICD-10-CM | POA: Diagnosis not present

## 2013-02-21 DIAGNOSIS — E669 Obesity, unspecified: Secondary | ICD-10-CM | POA: Diagnosis not present

## 2013-02-21 DIAGNOSIS — G9332 Myalgic encephalomyelitis/chronic fatigue syndrome: Secondary | ICD-10-CM | POA: Diagnosis not present

## 2013-02-21 DIAGNOSIS — R5382 Chronic fatigue, unspecified: Secondary | ICD-10-CM | POA: Diagnosis not present

## 2013-02-21 DIAGNOSIS — R5381 Other malaise: Secondary | ICD-10-CM | POA: Diagnosis not present

## 2013-02-21 DIAGNOSIS — M069 Rheumatoid arthritis, unspecified: Secondary | ICD-10-CM | POA: Diagnosis not present

## 2013-02-21 DIAGNOSIS — I10 Essential (primary) hypertension: Secondary | ICD-10-CM | POA: Diagnosis not present

## 2013-02-21 DIAGNOSIS — F17200 Nicotine dependence, unspecified, uncomplicated: Secondary | ICD-10-CM | POA: Diagnosis not present

## 2013-02-21 DIAGNOSIS — Z6841 Body Mass Index (BMI) 40.0 and over, adult: Secondary | ICD-10-CM | POA: Diagnosis not present

## 2013-02-21 DIAGNOSIS — Z0000 Encounter for general adult medical examination without abnormal findings: Secondary | ICD-10-CM | POA: Diagnosis not present

## 2013-02-21 DIAGNOSIS — E8881 Metabolic syndrome: Secondary | ICD-10-CM | POA: Diagnosis not present

## 2013-02-21 DIAGNOSIS — Z79899 Other long term (current) drug therapy: Secondary | ICD-10-CM | POA: Diagnosis not present

## 2013-02-21 DIAGNOSIS — E559 Vitamin D deficiency, unspecified: Secondary | ICD-10-CM | POA: Diagnosis not present

## 2013-03-13 DIAGNOSIS — H81399 Other peripheral vertigo, unspecified ear: Secondary | ICD-10-CM | POA: Diagnosis not present

## 2013-03-13 DIAGNOSIS — Z79899 Other long term (current) drug therapy: Secondary | ICD-10-CM | POA: Diagnosis not present

## 2013-03-13 DIAGNOSIS — M546 Pain in thoracic spine: Secondary | ICD-10-CM | POA: Diagnosis not present

## 2013-03-13 DIAGNOSIS — L089 Local infection of the skin and subcutaneous tissue, unspecified: Secondary | ICD-10-CM | POA: Diagnosis not present

## 2013-03-13 DIAGNOSIS — H8190 Unspecified disorder of vestibular function, unspecified ear: Secondary | ICD-10-CM | POA: Diagnosis not present

## 2013-03-13 DIAGNOSIS — F341 Dysthymic disorder: Secondary | ICD-10-CM | POA: Diagnosis not present

## 2013-03-13 DIAGNOSIS — L732 Hidradenitis suppurativa: Secondary | ICD-10-CM | POA: Diagnosis not present

## 2013-03-13 DIAGNOSIS — G541 Lumbosacral plexus disorders: Secondary | ICD-10-CM | POA: Diagnosis not present

## 2013-04-02 DIAGNOSIS — R635 Abnormal weight gain: Secondary | ICD-10-CM | POA: Diagnosis not present

## 2013-04-02 DIAGNOSIS — G9332 Myalgic encephalomyelitis/chronic fatigue syndrome: Secondary | ICD-10-CM | POA: Diagnosis not present

## 2013-04-02 DIAGNOSIS — R5382 Chronic fatigue, unspecified: Secondary | ICD-10-CM | POA: Diagnosis not present

## 2013-04-02 DIAGNOSIS — E559 Vitamin D deficiency, unspecified: Secondary | ICD-10-CM | POA: Diagnosis not present

## 2013-04-02 DIAGNOSIS — I10 Essential (primary) hypertension: Secondary | ICD-10-CM | POA: Diagnosis not present

## 2013-04-12 DIAGNOSIS — H8190 Unspecified disorder of vestibular function, unspecified ear: Secondary | ICD-10-CM | POA: Diagnosis not present

## 2013-04-12 DIAGNOSIS — H81399 Other peripheral vertigo, unspecified ear: Secondary | ICD-10-CM | POA: Diagnosis not present

## 2013-04-12 DIAGNOSIS — Z79899 Other long term (current) drug therapy: Secondary | ICD-10-CM | POA: Diagnosis not present

## 2013-04-12 DIAGNOSIS — G541 Lumbosacral plexus disorders: Secondary | ICD-10-CM | POA: Diagnosis not present

## 2013-04-12 DIAGNOSIS — F341 Dysthymic disorder: Secondary | ICD-10-CM | POA: Diagnosis not present

## 2013-05-10 DIAGNOSIS — M546 Pain in thoracic spine: Secondary | ICD-10-CM | POA: Diagnosis not present

## 2013-05-10 DIAGNOSIS — M542 Cervicalgia: Secondary | ICD-10-CM | POA: Diagnosis not present

## 2013-05-10 DIAGNOSIS — Z79899 Other long term (current) drug therapy: Secondary | ICD-10-CM | POA: Diagnosis not present

## 2013-05-10 DIAGNOSIS — M545 Low back pain, unspecified: Secondary | ICD-10-CM | POA: Diagnosis not present

## 2013-05-10 DIAGNOSIS — G541 Lumbosacral plexus disorders: Secondary | ICD-10-CM | POA: Diagnosis not present

## 2013-05-10 DIAGNOSIS — H81399 Other peripheral vertigo, unspecified ear: Secondary | ICD-10-CM | POA: Diagnosis not present

## 2013-05-10 DIAGNOSIS — H8190 Unspecified disorder of vestibular function, unspecified ear: Secondary | ICD-10-CM | POA: Diagnosis not present

## 2013-05-16 DIAGNOSIS — Z79899 Other long term (current) drug therapy: Secondary | ICD-10-CM | POA: Diagnosis not present

## 2013-06-06 DIAGNOSIS — Z6841 Body Mass Index (BMI) 40.0 and over, adult: Secondary | ICD-10-CM | POA: Diagnosis not present

## 2013-06-06 DIAGNOSIS — F3289 Other specified depressive episodes: Secondary | ICD-10-CM | POA: Diagnosis not present

## 2013-06-06 DIAGNOSIS — R635 Abnormal weight gain: Secondary | ICD-10-CM | POA: Diagnosis not present

## 2013-06-06 DIAGNOSIS — F329 Major depressive disorder, single episode, unspecified: Secondary | ICD-10-CM | POA: Diagnosis not present

## 2013-06-07 DIAGNOSIS — H8190 Unspecified disorder of vestibular function, unspecified ear: Secondary | ICD-10-CM | POA: Diagnosis not present

## 2013-06-07 DIAGNOSIS — H81399 Other peripheral vertigo, unspecified ear: Secondary | ICD-10-CM | POA: Diagnosis not present

## 2013-06-07 DIAGNOSIS — M25549 Pain in joints of unspecified hand: Secondary | ICD-10-CM | POA: Diagnosis not present

## 2013-06-07 DIAGNOSIS — G541 Lumbosacral plexus disorders: Secondary | ICD-10-CM | POA: Diagnosis not present

## 2013-07-05 DIAGNOSIS — Z79899 Other long term (current) drug therapy: Secondary | ICD-10-CM | POA: Diagnosis not present

## 2013-07-05 DIAGNOSIS — G541 Lumbosacral plexus disorders: Secondary | ICD-10-CM | POA: Diagnosis not present

## 2013-07-05 DIAGNOSIS — H8190 Unspecified disorder of vestibular function, unspecified ear: Secondary | ICD-10-CM | POA: Diagnosis not present

## 2013-07-05 DIAGNOSIS — M25549 Pain in joints of unspecified hand: Secondary | ICD-10-CM | POA: Diagnosis not present

## 2013-07-05 DIAGNOSIS — H81399 Other peripheral vertigo, unspecified ear: Secondary | ICD-10-CM | POA: Diagnosis not present

## 2013-07-05 DIAGNOSIS — M542 Cervicalgia: Secondary | ICD-10-CM | POA: Diagnosis not present

## 2013-07-10 DIAGNOSIS — E669 Obesity, unspecified: Secondary | ICD-10-CM | POA: Diagnosis not present

## 2013-07-10 DIAGNOSIS — E559 Vitamin D deficiency, unspecified: Secondary | ICD-10-CM | POA: Diagnosis not present

## 2013-07-10 DIAGNOSIS — E781 Pure hyperglyceridemia: Secondary | ICD-10-CM | POA: Diagnosis not present

## 2013-07-10 DIAGNOSIS — Z79899 Other long term (current) drug therapy: Secondary | ICD-10-CM | POA: Diagnosis not present

## 2013-07-10 DIAGNOSIS — I10 Essential (primary) hypertension: Secondary | ICD-10-CM | POA: Diagnosis not present

## 2013-07-30 DIAGNOSIS — Z79899 Other long term (current) drug therapy: Secondary | ICD-10-CM | POA: Diagnosis not present

## 2013-07-30 DIAGNOSIS — M069 Rheumatoid arthritis, unspecified: Secondary | ICD-10-CM | POA: Diagnosis not present

## 2013-07-30 DIAGNOSIS — Z09 Encounter for follow-up examination after completed treatment for conditions other than malignant neoplasm: Secondary | ICD-10-CM | POA: Diagnosis not present

## 2013-07-30 DIAGNOSIS — IMO0001 Reserved for inherently not codable concepts without codable children: Secondary | ICD-10-CM | POA: Diagnosis not present

## 2013-08-02 DIAGNOSIS — G541 Lumbosacral plexus disorders: Secondary | ICD-10-CM | POA: Diagnosis not present

## 2013-08-02 DIAGNOSIS — H81399 Other peripheral vertigo, unspecified ear: Secondary | ICD-10-CM | POA: Diagnosis not present

## 2013-08-02 DIAGNOSIS — H8190 Unspecified disorder of vestibular function, unspecified ear: Secondary | ICD-10-CM | POA: Diagnosis not present

## 2013-08-02 DIAGNOSIS — M25549 Pain in joints of unspecified hand: Secondary | ICD-10-CM | POA: Diagnosis not present

## 2013-08-31 DIAGNOSIS — H8190 Unspecified disorder of vestibular function, unspecified ear: Secondary | ICD-10-CM | POA: Diagnosis not present

## 2013-08-31 DIAGNOSIS — G541 Lumbosacral plexus disorders: Secondary | ICD-10-CM | POA: Diagnosis not present

## 2013-08-31 DIAGNOSIS — M25549 Pain in joints of unspecified hand: Secondary | ICD-10-CM | POA: Diagnosis not present

## 2013-08-31 DIAGNOSIS — H81399 Other peripheral vertigo, unspecified ear: Secondary | ICD-10-CM | POA: Diagnosis not present

## 2013-08-31 DIAGNOSIS — M5430 Sciatica, unspecified side: Secondary | ICD-10-CM | POA: Diagnosis not present

## 2013-09-19 DIAGNOSIS — Z79899 Other long term (current) drug therapy: Secondary | ICD-10-CM | POA: Diagnosis not present

## 2013-09-19 DIAGNOSIS — N390 Urinary tract infection, site not specified: Secondary | ICD-10-CM | POA: Diagnosis not present

## 2013-09-28 DIAGNOSIS — M533 Sacrococcygeal disorders, not elsewhere classified: Secondary | ICD-10-CM | POA: Diagnosis not present

## 2013-09-28 DIAGNOSIS — S335XXA Sprain of ligaments of lumbar spine, initial encounter: Secondary | ICD-10-CM | POA: Diagnosis not present

## 2013-09-28 DIAGNOSIS — M5137 Other intervertebral disc degeneration, lumbosacral region: Secondary | ICD-10-CM | POA: Diagnosis not present

## 2013-09-28 DIAGNOSIS — M545 Low back pain, unspecified: Secondary | ICD-10-CM | POA: Diagnosis not present

## 2013-09-28 DIAGNOSIS — M25569 Pain in unspecified knee: Secondary | ICD-10-CM | POA: Diagnosis not present

## 2013-10-26 DIAGNOSIS — S335XXA Sprain of ligaments of lumbar spine, initial encounter: Secondary | ICD-10-CM | POA: Diagnosis not present

## 2013-10-26 DIAGNOSIS — M545 Low back pain, unspecified: Secondary | ICD-10-CM | POA: Diagnosis not present

## 2013-10-26 DIAGNOSIS — M5137 Other intervertebral disc degeneration, lumbosacral region: Secondary | ICD-10-CM | POA: Diagnosis not present

## 2013-10-26 DIAGNOSIS — Z79899 Other long term (current) drug therapy: Secondary | ICD-10-CM | POA: Diagnosis not present

## 2013-10-26 DIAGNOSIS — M542 Cervicalgia: Secondary | ICD-10-CM | POA: Diagnosis not present

## 2013-10-26 DIAGNOSIS — M533 Sacrococcygeal disorders, not elsewhere classified: Secondary | ICD-10-CM | POA: Diagnosis not present

## 2013-10-26 DIAGNOSIS — G608 Other hereditary and idiopathic neuropathies: Secondary | ICD-10-CM | POA: Diagnosis not present

## 2013-10-26 DIAGNOSIS — M25569 Pain in unspecified knee: Secondary | ICD-10-CM | POA: Diagnosis not present

## 2013-11-22 DIAGNOSIS — Z79899 Other long term (current) drug therapy: Secondary | ICD-10-CM | POA: Diagnosis not present

## 2013-11-23 DIAGNOSIS — M545 Low back pain, unspecified: Secondary | ICD-10-CM | POA: Diagnosis not present

## 2013-11-23 DIAGNOSIS — M5137 Other intervertebral disc degeneration, lumbosacral region: Secondary | ICD-10-CM | POA: Diagnosis not present

## 2013-11-23 DIAGNOSIS — M5430 Sciatica, unspecified side: Secondary | ICD-10-CM | POA: Diagnosis not present

## 2013-11-23 DIAGNOSIS — M533 Sacrococcygeal disorders, not elsewhere classified: Secondary | ICD-10-CM | POA: Diagnosis not present

## 2013-12-21 DIAGNOSIS — M5030 Other cervical disc degeneration, unspecified cervical region: Secondary | ICD-10-CM | POA: Diagnosis not present

## 2013-12-21 DIAGNOSIS — M545 Low back pain, unspecified: Secondary | ICD-10-CM | POA: Diagnosis not present

## 2013-12-21 DIAGNOSIS — M533 Sacrococcygeal disorders, not elsewhere classified: Secondary | ICD-10-CM | POA: Diagnosis not present

## 2013-12-21 DIAGNOSIS — M5430 Sciatica, unspecified side: Secondary | ICD-10-CM | POA: Diagnosis not present

## 2013-12-21 DIAGNOSIS — M5137 Other intervertebral disc degeneration, lumbosacral region: Secondary | ICD-10-CM | POA: Diagnosis not present

## 2013-12-24 DIAGNOSIS — J309 Allergic rhinitis, unspecified: Secondary | ICD-10-CM | POA: Diagnosis not present

## 2013-12-24 DIAGNOSIS — J029 Acute pharyngitis, unspecified: Secondary | ICD-10-CM | POA: Diagnosis not present

## 2013-12-24 DIAGNOSIS — Z79899 Other long term (current) drug therapy: Secondary | ICD-10-CM | POA: Diagnosis not present

## 2013-12-24 DIAGNOSIS — F17200 Nicotine dependence, unspecified, uncomplicated: Secondary | ICD-10-CM | POA: Diagnosis not present

## 2014-01-10 DIAGNOSIS — M069 Rheumatoid arthritis, unspecified: Secondary | ICD-10-CM | POA: Diagnosis not present

## 2014-01-10 DIAGNOSIS — Z79899 Other long term (current) drug therapy: Secondary | ICD-10-CM | POA: Diagnosis not present

## 2014-01-10 DIAGNOSIS — Z09 Encounter for follow-up examination after completed treatment for conditions other than malignant neoplasm: Secondary | ICD-10-CM | POA: Diagnosis not present

## 2014-01-10 DIAGNOSIS — IMO0001 Reserved for inherently not codable concepts without codable children: Secondary | ICD-10-CM | POA: Diagnosis not present

## 2014-01-18 DIAGNOSIS — M533 Sacrococcygeal disorders, not elsewhere classified: Secondary | ICD-10-CM | POA: Diagnosis not present

## 2014-01-18 DIAGNOSIS — M5137 Other intervertebral disc degeneration, lumbosacral region: Secondary | ICD-10-CM | POA: Diagnosis not present

## 2014-01-18 DIAGNOSIS — M545 Low back pain, unspecified: Secondary | ICD-10-CM | POA: Diagnosis not present

## 2014-02-14 DIAGNOSIS — Z79899 Other long term (current) drug therapy: Secondary | ICD-10-CM | POA: Diagnosis not present

## 2014-02-14 DIAGNOSIS — M5430 Sciatica, unspecified side: Secondary | ICD-10-CM | POA: Diagnosis not present

## 2014-02-14 DIAGNOSIS — M533 Sacrococcygeal disorders, not elsewhere classified: Secondary | ICD-10-CM | POA: Diagnosis not present

## 2014-02-14 DIAGNOSIS — M545 Low back pain, unspecified: Secondary | ICD-10-CM | POA: Diagnosis not present

## 2014-03-05 DIAGNOSIS — Z79899 Other long term (current) drug therapy: Secondary | ICD-10-CM | POA: Diagnosis not present

## 2014-03-14 DIAGNOSIS — M545 Low back pain, unspecified: Secondary | ICD-10-CM | POA: Diagnosis not present

## 2014-03-14 DIAGNOSIS — M5430 Sciatica, unspecified side: Secondary | ICD-10-CM | POA: Diagnosis not present

## 2014-03-14 DIAGNOSIS — G894 Chronic pain syndrome: Secondary | ICD-10-CM | POA: Diagnosis not present

## 2014-03-14 DIAGNOSIS — M5412 Radiculopathy, cervical region: Secondary | ICD-10-CM | POA: Diagnosis not present

## 2014-03-14 DIAGNOSIS — M542 Cervicalgia: Secondary | ICD-10-CM | POA: Diagnosis not present

## 2014-03-14 DIAGNOSIS — M533 Sacrococcygeal disorders, not elsewhere classified: Secondary | ICD-10-CM | POA: Diagnosis not present

## 2014-03-14 DIAGNOSIS — G541 Lumbosacral plexus disorders: Secondary | ICD-10-CM | POA: Diagnosis not present

## 2014-03-14 DIAGNOSIS — Z79899 Other long term (current) drug therapy: Secondary | ICD-10-CM | POA: Diagnosis not present

## 2014-04-11 DIAGNOSIS — M5430 Sciatica, unspecified side: Secondary | ICD-10-CM | POA: Diagnosis not present

## 2014-04-11 DIAGNOSIS — M533 Sacrococcygeal disorders, not elsewhere classified: Secondary | ICD-10-CM | POA: Diagnosis not present

## 2014-04-11 DIAGNOSIS — M545 Low back pain, unspecified: Secondary | ICD-10-CM | POA: Diagnosis not present

## 2014-04-11 DIAGNOSIS — G894 Chronic pain syndrome: Secondary | ICD-10-CM | POA: Diagnosis not present

## 2014-04-11 DIAGNOSIS — Z79899 Other long term (current) drug therapy: Secondary | ICD-10-CM | POA: Diagnosis not present

## 2014-04-24 DIAGNOSIS — Z111 Encounter for screening for respiratory tuberculosis: Secondary | ICD-10-CM | POA: Diagnosis not present

## 2014-04-24 DIAGNOSIS — Z79899 Other long term (current) drug therapy: Secondary | ICD-10-CM | POA: Diagnosis not present

## 2014-05-09 DIAGNOSIS — M545 Low back pain, unspecified: Secondary | ICD-10-CM | POA: Diagnosis not present

## 2014-05-09 DIAGNOSIS — M533 Sacrococcygeal disorders, not elsewhere classified: Secondary | ICD-10-CM | POA: Diagnosis not present

## 2014-05-09 DIAGNOSIS — M5412 Radiculopathy, cervical region: Secondary | ICD-10-CM | POA: Diagnosis not present

## 2014-05-09 DIAGNOSIS — M5430 Sciatica, unspecified side: Secondary | ICD-10-CM | POA: Diagnosis not present

## 2014-05-09 DIAGNOSIS — M542 Cervicalgia: Secondary | ICD-10-CM | POA: Diagnosis not present

## 2014-06-04 DIAGNOSIS — R209 Unspecified disturbances of skin sensation: Secondary | ICD-10-CM | POA: Diagnosis not present

## 2014-06-04 DIAGNOSIS — G894 Chronic pain syndrome: Secondary | ICD-10-CM | POA: Diagnosis not present

## 2014-06-04 DIAGNOSIS — Z79899 Other long term (current) drug therapy: Secondary | ICD-10-CM | POA: Diagnosis not present

## 2014-06-04 DIAGNOSIS — M5137 Other intervertebral disc degeneration, lumbosacral region: Secondary | ICD-10-CM | POA: Diagnosis not present

## 2014-06-13 DIAGNOSIS — M069 Rheumatoid arthritis, unspecified: Secondary | ICD-10-CM | POA: Diagnosis not present

## 2014-06-13 DIAGNOSIS — Z79899 Other long term (current) drug therapy: Secondary | ICD-10-CM | POA: Diagnosis not present

## 2014-06-13 DIAGNOSIS — Z09 Encounter for follow-up examination after completed treatment for conditions other than malignant neoplasm: Secondary | ICD-10-CM | POA: Diagnosis not present

## 2014-06-13 DIAGNOSIS — IMO0001 Reserved for inherently not codable concepts without codable children: Secondary | ICD-10-CM | POA: Diagnosis not present

## 2014-06-20 DIAGNOSIS — F259 Schizoaffective disorder, unspecified: Secondary | ICD-10-CM | POA: Diagnosis not present

## 2014-06-21 DIAGNOSIS — F259 Schizoaffective disorder, unspecified: Secondary | ICD-10-CM | POA: Diagnosis not present

## 2014-06-22 ENCOUNTER — Emergency Department (HOSPITAL_COMMUNITY)
Admission: EM | Admit: 2014-06-22 | Discharge: 2014-06-22 | Disposition: A | Discharge status: Home / Self Care | Payer: Medicare Other | Attending: Emergency Medicine | Admitting: Emergency Medicine

## 2014-06-22 ENCOUNTER — Encounter (HOSPITAL_COMMUNITY): Payer: Self-pay | Admitting: Emergency Medicine

## 2014-06-22 DIAGNOSIS — I10 Essential (primary) hypertension: Secondary | ICD-10-CM | POA: Insufficient documentation

## 2014-06-22 DIAGNOSIS — F17200 Nicotine dependence, unspecified, uncomplicated: Secondary | ICD-10-CM | POA: Insufficient documentation

## 2014-06-22 DIAGNOSIS — Z79899 Other long term (current) drug therapy: Secondary | ICD-10-CM | POA: Diagnosis not present

## 2014-06-22 DIAGNOSIS — M25519 Pain in unspecified shoulder: Secondary | ICD-10-CM | POA: Insufficient documentation

## 2014-06-22 DIAGNOSIS — M25512 Pain in left shoulder: Secondary | ICD-10-CM

## 2014-06-22 DIAGNOSIS — M159 Polyosteoarthritis, unspecified: Secondary | ICD-10-CM | POA: Insufficient documentation

## 2014-06-22 DIAGNOSIS — M19019 Primary osteoarthritis, unspecified shoulder: Secondary | ICD-10-CM | POA: Diagnosis not present

## 2014-06-22 DIAGNOSIS — M129 Arthropathy, unspecified: Secondary | ICD-10-CM

## 2014-06-22 DIAGNOSIS — Z791 Long term (current) use of non-steroidal anti-inflammatories (NSAID): Secondary | ICD-10-CM | POA: Diagnosis not present

## 2014-06-22 DIAGNOSIS — IMO0001 Reserved for inherently not codable concepts without codable children: Secondary | ICD-10-CM | POA: Insufficient documentation

## 2014-06-22 DIAGNOSIS — M069 Rheumatoid arthritis, unspecified: Secondary | ICD-10-CM | POA: Diagnosis not present

## 2014-06-22 NOTE — ED Notes (Signed)
Pt reports left shoulder pain x 2 months, increases with movement. States it "feels like joint pain." hx of arthritis but no relief with her meds at home.

## 2014-06-22 NOTE — ED Provider Notes (Signed)
Medical screening examination/treatment/procedure(s) were performed by non-physician practitioner and as supervising physician I was immediately available for consultation/collaboration.   EKG Interpretation None        Lamoyne Hessel N Janaa Acero, DO 06/22/14 2317 

## 2014-06-22 NOTE — ED Provider Notes (Signed)
CSN: [id number]     Arrival date & time 06/22/14  1555 History   None    This chart was scribed for non-physician practitioner, Abigail Butts PA-C working with No att. providers found by Forrestine Him, ED Scribe. This patient was seen in room TR06C/TR06C and the patient's care was started at 6:18 PM.   Chief Complaint  Patient presents with  . Shoulder Pain   The history is provided by the patient. No language interpreter was used.    HPI Comments: Shelby Robertson is a 43 y.o. female with a PMHx of HTN, osteoarthritis, and rheumatoid arthritis who presents to the Emergency Department complaining of constant, moderate L shoulder pain x 2 months. Pt states pain radiates into the back of her neck. Pain is exacerbated with all movements of the L arm. No alleviating factors at this itme. She has tried Hydrocodone and Tizanidine prescribed from pain management clinic with only minimal improvement for symptoms. Last dose of Hyrocodone was last night. Pt has also tried OTC Tylenol extra strength arthritis with no relief. She denies any fever or chills. No weakness, loss of sensation,or paresthesia. Pt is followed by Dr. Bo Merino  for history of RA but denies any establishment or follow up with an orthopedic provider. No known allergies to medications. No other concerns this visit.  Past Medical History  Diagnosis Date  . Hypertension   . Fibromyalgia   . Osteoarthritis   . Fibromyalgia   . Rheumatoid arthritis(714.0)   . Rheumatoid arthritis(714.0)    History reviewed. No pertinent past surgical history. History reviewed. No pertinent family history. History  Substance Use Topics  . Smoking status: Current Every Day Smoker  . Smokeless tobacco: Not on file  . Alcohol Use: No   OB History   Grav Para Term Preterm Abortions TAB SAB Ect Mult Living                 Review of Systems  Constitutional: Negative for fever and chills.  Gastrointestinal: Negative for nausea and  vomiting.  Musculoskeletal: Positive for arthralgias and joint swelling. Negative for back pain, neck pain and neck stiffness.  Skin: Negative for wound.  Neurological: Negative for weakness and numbness.  Hematological: Does not bruise/bleed easily.  Psychiatric/Behavioral: The patient is not nervous/anxious.   All other systems reviewed and are negative.     Allergies  Review of patient's allergies indicates no known allergies.  Home Medications   Prior to Admission medications   Medication Sig Start Date End Date Taking? Authorizing Provider  acetaminophen (TYLENOL) 325 MG tablet Take 650 mg by mouth every 6 (six) hours as needed for pain.   Yes Historical Provider, MD  Adalimumab (HUMIRA PEN [place]) Inject into the skin. One injection every 2 weeks. Patient is not sure how much she injects   Yes Historical Provider, MD  amlodipine-olmesartan (AZOR) 10-20 MG per tablet Take 1 tablet by mouth daily.   Yes Historical Provider, MD  DULoxetine (CYMBALTA) 60 MG capsule Take 60 mg by mouth daily.     Yes Historical Provider, MD  HYDROcodone-acetaminophen (NORCO) 10-325 MG per tablet Take 1 tablet by mouth every 6 (six) hours as needed for moderate pain.    Yes Historical Provider, MD  ibuprofen (ADVIL,MOTRIN) 200 MG tablet Take 400 mg by mouth every 6 (six) hours as needed for pain.   Yes Historical Provider, MD  meloxicam (MOBIC) 15 MG tablet Take 15 mg by mouth daily.     Yes Historical Provider, MD  methotrexate (RHEUMATREX) 2.5 MG tablet Take 20 mg by mouth once a week. Caution:Chemotherapy. Protect from light. No specific days.   Yes Historical Provider, MD  tiZANidine (ZANAFLEX) 4 MG tablet Take 4 mg by mouth every 6 (six) hours as needed (for muscle spasms).    Yes Historical Provider, MD  traZODone (DESYREL) 50 MG tablet Take 50 mg by mouth at bedtime.   Yes Historical Provider, MD  venlafaxine (EFFEXOR) 75 MG tablet Take 75 mg by mouth 2 (two) times daily.   Yes Historical Provider, MD    Triage Vitals: BP 118/83  Pulse 97  Temp(Src) 98 F (36.7 C) (Oral)  Resp 16  SpO2 95%  LMP 06/08/2014   Physical Exam  Nursing note and vitals reviewed. Constitutional: She appears well-developed and well-nourished. No distress.  HENT:  Head: Normocephalic and atraumatic.  Eyes: Conjunctivae are normal.  Neck: Normal range of motion.  Cardiovascular: Normal rate, regular rhythm and intact distal pulses.   Capillary refill less than 3 seconds  Pulmonary/Chest: Effort normal and breath sounds normal.  Musculoskeletal: She exhibits tenderness. She exhibits no edema.  Negative empty can test to L shoulder Decrease active ROM and full passive ROM to L shoulder No swelling, erythema or increased warmth to the left shoulder  Neurological: She is alert. Coordination normal.  Sensation intact Strength 4/5 in LUE due to pain and 5/5 in RUE  Skin: Skin is warm and dry. She is not diaphoretic.  No tenting of the skin  Psychiatric: She has a normal mood and affect.    ED Course  Procedures (including critical care time)  DIAGNOSTIC STUDIES: Oxygen Saturation is 98% on RA, Normal by my interpretation.    COORDINATION OF CARE: 6:18 PM-Discussed treatment plan with pt at bedside and pt agreed to plan.     Labs Review Labs Reviewed - No data to display  Imaging Review No results found.   EKG Interpretation None      MDM   Final diagnoses:  Arthralgia of left shoulder region  ARTHRITIS, GENERALIZED  FIBROMYALGIA   Shelby Robertson presents with acute exacerbation of her chronic arthralgia.  Patient  without reported trauma or falls, no x-ray indicated at this time. Patient offered further pain control while here in the emergency department but declined. Patient without erythema, swelling or increased warmth to suggest septic joint. She reports that she wants a shot in her shoulder. Patient is being seen by pain management and therefore will not be discharged home with  narcotics. Patient instructed to see orthopedics for left shoulder injection. No evidence of frozen shoulder. Conservative therapy recommended and discussed. Patient will be dc home & is agreeable with above plan.  BP 118/83  Pulse 97  Temp(Src) 98 F (36.7 C) (Oral)  Resp 16  SpO2 95%  LMP 06/08/2014  I personally performed the services described in this documentation, which was scribed in my presence. The recorded information has been reviewed and is accurate.    Jarrett Soho Susano Cleckler, PA-C 06/22/14 1820

## 2014-06-22 NOTE — Discharge Instructions (Signed)
1. Medications: usual home medications including pain medication and muscle  2. Treatment: rest, drink plenty of fluids, heat, muscle stretching 3. Follow Up: Please followup with your primary doctor, pain management and orthopedics in 1 week and  for discussion of your diagnoses and further evaluation after today's visit;      Arthralgia Your caregiver has diagnosed you as suffering from an arthralgia. Arthralgia means there is pain in a joint. This can come from many reasons including:  Bruising the joint which causes soreness (inflammation) in the joint.  Wear and tear on the joints which occur as we grow older (osteoarthritis).  Overusing the joint.  Various forms of arthritis.  Infections of the joint. Regardless of the cause of pain in your joint, most of these different pains respond to anti-inflammatory drugs and rest. The exception to this is when a joint is infected, and these cases are treated with antibiotics, if it is a bacterial infection. HOME CARE INSTRUCTIONS   Rest the injured area for as long as directed by your caregiver. Then slowly start using the joint as directed by your caregiver and as the pain allows. Crutches as directed may be useful if the ankles, knees or hips are involved. If the knee was splinted or casted, continue use and care as directed. If an stretchy or elastic wrapping bandage has been applied today, it should be removed and re-applied every 3 to 4 hours. It should not be applied tightly, but firmly enough to keep swelling down. Watch toes and feet for swelling, bluish discoloration, coldness, numbness or excessive pain. If any of these problems (symptoms) occur, remove the ace bandage and re-apply more loosely. If these symptoms persist, contact your caregiver or return to this location.  For the first 24 hours, keep the injured extremity elevated on pillows while lying down.  Apply ice for 15-20 minutes to the sore joint every couple hours while  awake for the first half day. Then 03-04 times per day for the first 48 hours. Put the ice in a plastic bag and place a towel between the bag of ice and your skin.  Wear any splinting, casting, elastic bandage applications, or slings as instructed.  Only take over-the-counter or prescription medicines for pain, discomfort, or fever as directed by your caregiver. Do not use aspirin immediately after the injury unless instructed by your physician. Aspirin can cause increased bleeding and bruising of the tissues.  If you were given crutches, continue to use them as instructed and do not resume weight bearing on the sore joint until instructed. Persistent pain and inability to use the sore joint as directed for more than 2 to 3 days are warning signs indicating that you should see a caregiver for a follow-up visit as soon as possible. Initially, a hairline fracture (break in bone) may not be evident on X-rays. Persistent pain and swelling indicate that further evaluation, non-weight bearing or use of the joint (use of crutches or slings as instructed), or further X-rays are indicated. X-rays may sometimes not show a small fracture until a week or 10 days later. Make a follow-up appointment with your own caregiver or one to whom we have referred you. A radiologist (specialist in reading X-rays) may read your X-rays. Make sure you know how you are to obtain your X-ray results. Do not assume everything is normal if you do not hear from [place]. SEEK MEDICAL CARE IF: Bruising, swelling, or pain increases. SEEK IMMEDIATE MEDICAL CARE IF:   Your fingers  or toes are numb or blue.  The pain is not responding to medications and continues to stay the same or get worse.  The pain in your joint becomes severe.  You develop a fever over 102 F (38.9 C).  It becomes impossible to move or use the joint. MAKE SURE YOU:   Understand these instructions.  Will watch your condition.  Will get help right away if you  are not doing well or get worse. Document Released: 10/04/2005 Document Revised: 12/27/2011 Document Reviewed: 05/22/2008 [facility] Patient Information 2015 [place], [place]. This information is not intended to replace advice given to you by your health care provider. Make sure you discuss any questions you have with your health care provider.

## 2014-06-28 DIAGNOSIS — L732 Hidradenitis suppurativa: Secondary | ICD-10-CM | POA: Diagnosis not present

## 2014-06-28 DIAGNOSIS — L738 Other specified follicular disorders: Secondary | ICD-10-CM | POA: Diagnosis not present

## 2014-06-28 DIAGNOSIS — L678 Other hair color and hair shaft abnormalities: Secondary | ICD-10-CM | POA: Diagnosis not present

## 2014-06-28 DIAGNOSIS — L739 Follicular disorder, unspecified: Secondary | ICD-10-CM | POA: Insufficient documentation

## 2014-07-08 DIAGNOSIS — M25519 Pain in unspecified shoulder: Secondary | ICD-10-CM | POA: Diagnosis not present

## 2014-07-10 DIAGNOSIS — I10 Essential (primary) hypertension: Secondary | ICD-10-CM | POA: Diagnosis not present

## 2014-07-10 DIAGNOSIS — R609 Edema, unspecified: Secondary | ICD-10-CM | POA: Diagnosis not present

## 2014-07-10 DIAGNOSIS — R635 Abnormal weight gain: Secondary | ICD-10-CM | POA: Diagnosis not present

## 2014-07-17 DIAGNOSIS — Z23 Encounter for immunization: Secondary | ICD-10-CM | POA: Diagnosis not present

## 2014-07-17 DIAGNOSIS — Z79899 Other long term (current) drug therapy: Secondary | ICD-10-CM | POA: Diagnosis not present

## 2014-07-17 DIAGNOSIS — R609 Edema, unspecified: Secondary | ICD-10-CM | POA: Diagnosis not present

## 2014-07-17 DIAGNOSIS — R635 Abnormal weight gain: Secondary | ICD-10-CM | POA: Diagnosis not present

## 2014-07-24 DIAGNOSIS — Z79899 Other long term (current) drug therapy: Secondary | ICD-10-CM | POA: Diagnosis not present

## 2014-08-07 DIAGNOSIS — M25519 Pain in unspecified shoulder: Secondary | ICD-10-CM | POA: Diagnosis not present

## 2014-08-07 DIAGNOSIS — M05412 Rheumatoid myopathy with rheumatoid arthritis of left shoulder: Secondary | ICD-10-CM | POA: Diagnosis not present

## 2014-08-07 DIAGNOSIS — Z79891 Long term (current) use of opiate analgesic: Secondary | ICD-10-CM | POA: Diagnosis not present

## 2014-08-07 DIAGNOSIS — M542 Cervicalgia: Secondary | ICD-10-CM | POA: Diagnosis not present

## 2014-08-07 DIAGNOSIS — M549 Dorsalgia, unspecified: Secondary | ICD-10-CM | POA: Diagnosis not present

## 2014-08-12 DIAGNOSIS — N390 Urinary tract infection, site not specified: Secondary | ICD-10-CM | POA: Diagnosis not present

## 2014-09-05 DIAGNOSIS — M545 Low back pain: Secondary | ICD-10-CM | POA: Diagnosis not present

## 2014-09-05 DIAGNOSIS — M25511 Pain in right shoulder: Secondary | ICD-10-CM | POA: Diagnosis not present

## 2014-09-05 DIAGNOSIS — M25512 Pain in left shoulder: Secondary | ICD-10-CM | POA: Diagnosis not present

## 2014-09-05 DIAGNOSIS — M4327 Fusion of spine, lumbosacral region: Secondary | ICD-10-CM | POA: Diagnosis not present

## 2014-09-05 DIAGNOSIS — G608 Other hereditary and idiopathic neuropathies: Secondary | ICD-10-CM | POA: Diagnosis not present

## 2014-09-05 DIAGNOSIS — G603 Idiopathic progressive neuropathy: Secondary | ICD-10-CM | POA: Diagnosis not present

## 2014-09-05 DIAGNOSIS — M542 Cervicalgia: Secondary | ICD-10-CM | POA: Diagnosis not present

## 2014-10-08 DIAGNOSIS — G890 Central pain syndrome: Secondary | ICD-10-CM | POA: Diagnosis not present

## 2014-10-08 DIAGNOSIS — M545 Low back pain: Secondary | ICD-10-CM | POA: Diagnosis not present

## 2014-10-08 DIAGNOSIS — G8929 Other chronic pain: Secondary | ICD-10-CM | POA: Diagnosis not present

## 2014-10-08 DIAGNOSIS — M546 Pain in thoracic spine: Secondary | ICD-10-CM | POA: Diagnosis not present

## 2014-10-08 DIAGNOSIS — R202 Paresthesia of skin: Secondary | ICD-10-CM | POA: Diagnosis not present

## 2014-11-05 DIAGNOSIS — G8929 Other chronic pain: Secondary | ICD-10-CM | POA: Diagnosis not present

## 2014-11-05 DIAGNOSIS — M542 Cervicalgia: Secondary | ICD-10-CM | POA: Diagnosis not present

## 2014-11-05 DIAGNOSIS — G890 Central pain syndrome: Secondary | ICD-10-CM | POA: Diagnosis not present

## 2014-11-05 DIAGNOSIS — M5412 Radiculopathy, cervical region: Secondary | ICD-10-CM | POA: Diagnosis not present

## 2014-11-05 DIAGNOSIS — M545 Low back pain: Secondary | ICD-10-CM | POA: Diagnosis not present

## 2014-11-12 DIAGNOSIS — Z79899 Other long term (current) drug therapy: Secondary | ICD-10-CM | POA: Diagnosis not present

## 2014-11-18 DIAGNOSIS — M62830 Muscle spasm of back: Secondary | ICD-10-CM | POA: Diagnosis not present

## 2014-11-18 DIAGNOSIS — M797 Fibromyalgia: Secondary | ICD-10-CM | POA: Diagnosis not present

## 2014-11-18 DIAGNOSIS — G4701 Insomnia due to medical condition: Secondary | ICD-10-CM | POA: Diagnosis not present

## 2014-11-18 DIAGNOSIS — M069 Rheumatoid arthritis, unspecified: Secondary | ICD-10-CM | POA: Diagnosis not present

## 2014-12-09 DIAGNOSIS — M79672 Pain in left foot: Secondary | ICD-10-CM | POA: Diagnosis not present

## 2014-12-09 DIAGNOSIS — M79671 Pain in right foot: Secondary | ICD-10-CM | POA: Diagnosis not present

## 2014-12-09 DIAGNOSIS — M25522 Pain in left elbow: Secondary | ICD-10-CM | POA: Diagnosis not present

## 2014-12-09 DIAGNOSIS — M79605 Pain in left leg: Secondary | ICD-10-CM | POA: Diagnosis not present

## 2014-12-09 DIAGNOSIS — M79604 Pain in right leg: Secondary | ICD-10-CM | POA: Diagnosis not present

## 2014-12-09 DIAGNOSIS — M545 Low back pain: Secondary | ICD-10-CM | POA: Diagnosis not present

## 2014-12-09 DIAGNOSIS — G8929 Other chronic pain: Secondary | ICD-10-CM | POA: Diagnosis not present

## 2015-01-07 DIAGNOSIS — G8929 Other chronic pain: Secondary | ICD-10-CM | POA: Diagnosis not present

## 2015-01-07 DIAGNOSIS — Z79899 Other long term (current) drug therapy: Secondary | ICD-10-CM | POA: Diagnosis not present

## 2015-01-07 DIAGNOSIS — M542 Cervicalgia: Secondary | ICD-10-CM | POA: Diagnosis not present

## 2015-01-07 DIAGNOSIS — M545 Low back pain: Secondary | ICD-10-CM | POA: Diagnosis not present

## 2015-01-28 ENCOUNTER — Encounter (HOSPITAL_COMMUNITY): Payer: Self-pay | Admitting: Emergency Medicine

## 2015-01-28 ENCOUNTER — Emergency Department (HOSPITAL_COMMUNITY)
Admission: EM | Admit: 2015-01-28 | Discharge: 2015-01-29 | Disposition: A | Discharge status: Home / Self Care | Payer: Medicare Other | Attending: Emergency Medicine | Admitting: Emergency Medicine

## 2015-01-28 ENCOUNTER — Emergency Department (INDEPENDENT_AMBULATORY_CARE_PROVIDER_SITE_OTHER): Payer: Medicare Other

## 2015-01-28 ENCOUNTER — Encounter (HOSPITAL_COMMUNITY): Payer: Self-pay | Admitting: *Deleted

## 2015-01-28 ENCOUNTER — Emergency Department (INDEPENDENT_AMBULATORY_CARE_PROVIDER_SITE_OTHER)
Admission: EM | Admit: 2015-01-28 | Discharge: 2015-01-28 | Disposition: A | Discharge status: Home / Self Care | Payer: Medicare Other | Source: Home / Self Care | Attending: Family Medicine | Admitting: Family Medicine

## 2015-01-28 DIAGNOSIS — Z79899 Other long term (current) drug therapy: Secondary | ICD-10-CM | POA: Insufficient documentation

## 2015-01-28 DIAGNOSIS — M179 Osteoarthritis of knee, unspecified: Secondary | ICD-10-CM

## 2015-01-28 DIAGNOSIS — Z791 Long term (current) use of non-steroidal anti-inflammatories (NSAID): Secondary | ICD-10-CM | POA: Insufficient documentation

## 2015-01-28 DIAGNOSIS — M1712 Unilateral primary osteoarthritis, left knee: Secondary | ICD-10-CM | POA: Diagnosis not present

## 2015-01-28 DIAGNOSIS — M1990 Unspecified osteoarthritis, unspecified site: Secondary | ICD-10-CM | POA: Diagnosis not present

## 2015-01-28 DIAGNOSIS — M25562 Pain in left knee: Secondary | ICD-10-CM | POA: Diagnosis present

## 2015-01-28 DIAGNOSIS — M797 Fibromyalgia: Secondary | ICD-10-CM | POA: Insufficient documentation

## 2015-01-28 DIAGNOSIS — I10 Essential (primary) hypertension: Secondary | ICD-10-CM | POA: Diagnosis not present

## 2015-01-28 DIAGNOSIS — Z720 Tobacco use: Secondary | ICD-10-CM | POA: Insufficient documentation

## 2015-01-28 MED ORDER — DICLOFENAC SODIUM 1 % TD GEL: 4.0000 g | Freq: Four times a day (QID) | TRANSDERMAL | Status: DC

## 2015-01-28 MED ORDER — TRAMADOL HCL 50 MG PO TABS: 50.0000 mg | ORAL_TABLET | Freq: Four times a day (QID) | ORAL | Status: DC | PRN

## 2015-01-28 MED ORDER — DICLOFENAC SODIUM 1 % TD GEL
2.0000 g | TRANSDERMAL | Status: AC
Start: 2015-01-28 — End: 2015-01-29
  Administered 2015-01-29: 2 g via TOPICAL
  Filled 2015-01-28: qty 100

## 2015-01-28 NOTE — ED Provider Notes (Signed)
CSN: [id number]     Arrival date & time 01/28/15  1208 History   First MD Initiated Contact with Patient 01/28/15 1348     No chief complaint on file.  (Consider location/radiation/quality/duration/timing/severity/associated sxs/prior Treatment) Patient is a 44 y.o. female presenting with knee pain. The history is provided by the patient.  Knee Pain Location:  Knee Time since incident:  4 days Injury: no   Knee location:  L knee Pain details:    Quality:  Sharp   Radiates to:  Does not radiate   Severity:  Moderate   Onset quality:  Gradual Chronicity:  New Dislocation: no   Foreign body present:  No foreign bodies Prior injury to area:  No Associated symptoms: decreased ROM and stiffness   Associated symptoms: no fever and no numbness     Past Medical History  Diagnosis Date  . Hypertension   . Fibromyalgia   . Osteoarthritis   . Fibromyalgia   . Rheumatoid arthritis(714.0)   . Rheumatoid arthritis(714.0)    History reviewed. No pertinent past surgical history. History reviewed. No pertinent family history. History  Substance Use Topics  . Smoking status: Current Every Day Smoker  . Smokeless tobacco: Not on file  . Alcohol Use: No   OB History    No data available     Review of Systems  Constitutional: Negative.  Negative for fever.  Musculoskeletal: Positive for arthralgias, gait problem and stiffness. Negative for joint swelling.  Skin: Negative.     Allergies  Review of patient's allergies indicates no known allergies.  Home Medications   Prior to Admission medications   Medication Sig Start Date End Date Taking? Authorizing Provider  acetaminophen (TYLENOL) 325 MG tablet Take 650 mg by mouth every 6 (six) hours as needed for pain.    Historical Provider, MD  Adalimumab (HUMIRA PEN [place]) Inject into the skin. One injection every 2 weeks. Patient is not sure how much she injects    Historical Provider, MD  amlodipine-olmesartan (AZOR) 10-20 MG per tablet  Take 1 tablet by mouth daily.    Historical Provider, MD  diclofenac sodium (VOLTAREN) 1 % GEL Apply 4 g topically 4 (four) times daily. Please instruct in dosing. 01/28/15   Billy Fischer, MD  DULoxetine (CYMBALTA) 60 MG capsule Take 60 mg by mouth daily.      Historical Provider, MD  HYDROcodone-acetaminophen (NORCO) 10-325 MG per tablet Take 1 tablet by mouth every 6 (six) hours as needed for moderate pain.     Historical Provider, MD  ibuprofen (ADVIL,MOTRIN) 200 MG tablet Take 400 mg by mouth every 6 (six) hours as needed for pain.    Historical Provider, MD  meloxicam (MOBIC) 15 MG tablet Take 15 mg by mouth daily.      Historical Provider, MD  methotrexate (RHEUMATREX) 2.5 MG tablet Take 20 mg by mouth once a week. Caution:Chemotherapy. Protect from light. No specific days.    Historical Provider, MD  tiZANidine (ZANAFLEX) 4 MG tablet Take 4 mg by mouth every 6 (six) hours as needed (for muscle spasms).     Historical Provider, MD  traZODone (DESYREL) 50 MG tablet Take 50 mg by mouth at bedtime.    Historical Provider, MD  venlafaxine (EFFEXOR) 75 MG tablet Take 75 mg by mouth 2 (two) times daily.    Historical Provider, MD   BP 110/74 mmHg  Pulse 108  Temp(Src) 99.2 F (37.3 C) (Oral)  Resp 16  SpO2 95%  LMP 01/23/2015 Physical Exam  Constitutional: She is oriented to person, place, and time. She appears well-developed and well-nourished.  Musculoskeletal: She exhibits tenderness.       Left knee: She exhibits decreased range of motion. She exhibits no swelling, no effusion, normal patellar mobility and no bony tenderness.       Legs: Neurological: She is alert and oriented to person, place, and time.  Skin: Skin is warm and dry.  Nursing note and vitals reviewed.   ED Course  Procedures (including critical care time) Labs Review Labs Reviewed - No data to display  Imaging Review Dg Knee Complete 4 Views Left  01/28/2015   CLINICAL DATA:  Four day history of pain.  No  history of trauma  EXAM: LEFT KNEE - COMPLETE 4+ VIEW  COMPARISON:  None.  FINDINGS: Standing frontal, standing tunnel, standing lateral, and sunrise patellar images were obtained. There is no fracture or dislocation. Joint spaces appear intact. There is moderate narrowing medially. There is milder narrowing of the patellofemoral joint. There is slight intracondylar spurring. There are also small spurs along the posterior patella. No erosive change.  IMPRESSION: Areas of osteoarthritic change.  No fracture or effusion.   Electronically Signed   By: Lowella Grip III M.D.   On: 01/28/2015 14:47    X-rays reviewed and report per radiologist.  MDM   1. Osteoarthritis of left knee, unspecified osteoarthritis type        Billy Fischer, MD 01/28/15 1458

## 2015-01-28 NOTE — ED Provider Notes (Signed)
CSN: [id number]     Arrival date & time 01/28/15  2234 History  This chart was scribed for non-physician practitioner, Junius Creamer, FNP,working with Linton Flemings, MD, by Marlowe Kays, ED Scribe. This patient was seen in room WTR7/WTR7 and the patient's care was started at 11:33 PM.  Chief Complaint  Patient presents with  . Knee Pain    left   Patient is a 44 y.o. female presenting with knee pain. The history is provided by the patient and medical records. No language interpreter was used.  Knee Pain   HPI Comments:  Shelby Robertson is a 44 y.o. morbidly obese female with PMHx of rheumatoid arthritis and osteoarthritis who presents to the Emergency Department complaining of severe left knee pain for the past 3-4 days. Pt reports associated swelling of the knee. She states she was seen earlier today at the [facility] and was given an ace wrap and prescribed Voltaren Gel but states her insurance will not cover it. Walking makes the pain worse. Denies alleviating factors. She denies any fall, trauma or injury. Denies numbness, tingling or weakness of the LLE, bruising, redness or warmth of the area. PMHx of HTN and fibromyalgia.   Past Medical History  Diagnosis Date  . Hypertension   . Fibromyalgia   . Osteoarthritis   . Fibromyalgia   . Rheumatoid arthritis(714.0)   . Rheumatoid arthritis(714.0)    History reviewed. No pertinent past surgical history. History reviewed. No pertinent family history. History  Substance Use Topics  . Smoking status: Current Every Day Smoker -- 0.50 packs/day    Types: Cigarettes  . Smokeless tobacco: Not on file  . Alcohol Use: No   OB History    No data available     Review of Systems  Musculoskeletal: Positive for joint swelling and arthralgias.  Skin: Negative for color change and wound.  Neurological: Negative for seizures and weakness.  All other systems reviewed and are negative.   Allergies  Review of patient's allergies indicates no known  allergies.  Home Medications   Prior to Admission medications   Medication Sig Start Date End Date Taking? Authorizing Provider  acetaminophen (TYLENOL) 325 MG tablet Take 650 mg by mouth every 6 (six) hours as needed for pain.    Historical Provider, MD  Adalimumab (HUMIRA PEN [place]) Inject into the skin. One injection every 2 weeks. Patient is not sure how much she injects    Historical Provider, MD  amlodipine-olmesartan (AZOR) 10-20 MG per tablet Take 1 tablet by mouth daily.    Historical Provider, MD  diclofenac sodium (VOLTAREN) 1 % GEL Apply 4 g topically 4 (four) times daily. Please instruct in dosing. 01/28/15   Billy Fischer, MD  DULoxetine (CYMBALTA) 60 MG capsule Take 60 mg by mouth daily.      Historical Provider, MD  HYDROcodone-acetaminophen (NORCO) 10-325 MG per tablet Take 1 tablet by mouth every 6 (six) hours as needed for moderate pain.     Historical Provider, MD  ibuprofen (ADVIL,MOTRIN) 200 MG tablet Take 400 mg by mouth every 6 (six) hours as needed for pain.    Historical Provider, MD  meloxicam (MOBIC) 15 MG tablet Take 15 mg by mouth daily.      Historical Provider, MD  methotrexate (RHEUMATREX) 2.5 MG tablet Take 20 mg by mouth once a week. Caution:Chemotherapy. Protect from light. No specific days.    Historical Provider, MD  tiZANidine (ZANAFLEX) 4 MG tablet Take 4 mg by mouth every 6 (six) hours  as needed (for muscle spasms).     Historical Provider, MD  traZODone (DESYREL) 50 MG tablet Take 50 mg by mouth at bedtime.    Historical Provider, MD  venlafaxine (EFFEXOR) 75 MG tablet Take 75 mg by mouth 2 (two) times daily.    Historical Provider, MD   Triage Vitals: BP 117/77 mmHg  Pulse 113  Temp(Src) 97.6 F (36.4 C) (Oral)  Resp 18  Ht 5\' 6"  (1.676 m)  Wt 312 lb (141.522 kg)  BMI 50.38 kg/m2  SpO2 98%  LMP 01/23/2015 (Approximate) Physical Exam  Constitutional: She is oriented to person, place, and time. She appears well-developed and well-nourished.  HENT:   Head: Normocephalic and atraumatic.  Eyes: EOM are normal.  Neck: Normal range of motion.  Cardiovascular: Normal rate.   Pulmonary/Chest: Effort normal.  Musculoskeletal: Normal range of motion.  Neurological: She is alert and oriented to person, place, and time.  Skin: Skin is warm and dry.  Psychiatric: She has a normal mood and affect. Her behavior is normal.  Nursing note and vitals reviewed.   ED Course  Procedures (including critical care time) DIAGNOSTIC STUDIES: Oxygen Saturation is 98% on RA, normal by my interpretation.   COORDINATION OF CARE: 11:36 PM- Provided coupon for Voltaren gel. Pt verbalizes understanding and agrees to plan.  Medications - No data to display  Labs Review Labs Reviewed - No data to display  Imaging Review Dg Knee Complete 4 Views Left  01/28/2015   CLINICAL DATA:  Four day history of pain.  No history of trauma  EXAM: LEFT KNEE - COMPLETE 4+ VIEW  COMPARISON:  None.  FINDINGS: Standing frontal, standing tunnel, standing lateral, and sunrise patellar images were obtained. There is no fracture or dislocation. Joint spaces appear intact. There is moderate narrowing medially. There is milder narrowing of the patellofemoral joint. There is slight intracondylar spurring. There are also small spurs along the posterior patella. No erosive change.  IMPRESSION: Areas of osteoarthritic change.  No fracture or effusion.   Electronically Signed   By: Lowella Grip III M.D.   On: 01/28/2015 14:47     EKG Interpretation None      MDM   Final diagnoses:  None       I personally performed the services described in this documentation, which was scribed in my presence. The recorded information has been reviewed and is accurate.    Junius Creamer, NP 01/28/15 [place], MD 01/29/15 [phone]

## 2015-01-28 NOTE — Discharge Instructions (Signed)
Your have been given a coupon to help you purchase the Voltaren gel prescribed

## 2015-01-28 NOTE — ED Notes (Addendum)
Patient c/o left knee pain x3-4 days. Patient states intially the knee was sore, now she is having swelling. Patient with ACE wrap that was applied by [facility] earlier today. Patient states she was prescribed Voltaren ointment for the knee but her insurance will not cover the medication. Patient reports Hx of OA, RA, and fibromyalgia. XR done at [facility] today.

## 2015-01-28 NOTE — Discharge Instructions (Signed)
Use ice pack, ace and gel as needed, see your doctor if further problems.

## 2015-01-28 NOTE — ED Notes (Signed)
Bed: WTR7 Expected date:  Expected time:  Means of arrival:  Comments: Tr1

## 2015-01-28 NOTE — ED Notes (Signed)
Pt  Has  Two    Symptoms     Pain  l  Knee  X  3  Days   denys  A  specfic    inj  She  Reports  Symptoms  Worse  On  Weight  Bearing           Pt  Also  Reports  Frequency  And  Urgency  Of  Urination

## 2015-01-29 DIAGNOSIS — M1712 Unilateral primary osteoarthritis, left knee: Secondary | ICD-10-CM | POA: Diagnosis not present

## 2015-01-29 NOTE — ED Notes (Signed)
Patient discharged during downtime @ [phone]

## 2015-02-05 DIAGNOSIS — M1712 Unilateral primary osteoarthritis, left knee: Secondary | ICD-10-CM | POA: Diagnosis not present

## 2015-02-05 DIAGNOSIS — G890 Central pain syndrome: Secondary | ICD-10-CM | POA: Diagnosis not present

## 2015-02-05 DIAGNOSIS — M79662 Pain in left lower leg: Secondary | ICD-10-CM | POA: Diagnosis not present

## 2015-02-05 DIAGNOSIS — M79605 Pain in left leg: Secondary | ICD-10-CM | POA: Diagnosis not present

## 2015-02-05 DIAGNOSIS — M25562 Pain in left knee: Secondary | ICD-10-CM | POA: Diagnosis not present

## 2015-02-20 DIAGNOSIS — Z79899 Other long term (current) drug therapy: Secondary | ICD-10-CM | POA: Diagnosis not present

## 2015-02-20 DIAGNOSIS — M24542 Contracture, left hand: Secondary | ICD-10-CM | POA: Diagnosis not present

## 2015-02-20 DIAGNOSIS — M069 Rheumatoid arthritis, unspecified: Secondary | ICD-10-CM | POA: Diagnosis not present

## 2015-02-20 DIAGNOSIS — M7122 Synovial cyst of popliteal space [Baker], left knee: Secondary | ICD-10-CM | POA: Diagnosis not present

## 2015-02-20 DIAGNOSIS — M797 Fibromyalgia: Secondary | ICD-10-CM | POA: Diagnosis not present

## 2015-02-21 DIAGNOSIS — Z79899 Other long term (current) drug therapy: Secondary | ICD-10-CM | POA: Diagnosis not present

## 2015-03-05 DIAGNOSIS — M79671 Pain in right foot: Secondary | ICD-10-CM | POA: Diagnosis not present

## 2015-03-05 DIAGNOSIS — G541 Lumbosacral plexus disorders: Secondary | ICD-10-CM | POA: Diagnosis not present

## 2015-03-05 DIAGNOSIS — G603 Idiopathic progressive neuropathy: Secondary | ICD-10-CM | POA: Diagnosis not present

## 2015-03-05 DIAGNOSIS — Z79891 Long term (current) use of opiate analgesic: Secondary | ICD-10-CM | POA: Diagnosis not present

## 2015-03-05 DIAGNOSIS — G8929 Other chronic pain: Secondary | ICD-10-CM | POA: Diagnosis not present

## 2015-03-05 DIAGNOSIS — M25512 Pain in left shoulder: Secondary | ICD-10-CM | POA: Diagnosis not present

## 2015-03-05 DIAGNOSIS — M5417 Radiculopathy, lumbosacral region: Secondary | ICD-10-CM | POA: Diagnosis not present

## 2015-03-05 DIAGNOSIS — M79604 Pain in right leg: Secondary | ICD-10-CM | POA: Diagnosis not present

## 2015-03-05 DIAGNOSIS — M25511 Pain in right shoulder: Secondary | ICD-10-CM | POA: Diagnosis not present

## 2015-03-05 DIAGNOSIS — M79605 Pain in left leg: Secondary | ICD-10-CM | POA: Diagnosis not present

## 2015-03-05 DIAGNOSIS — M79672 Pain in left foot: Secondary | ICD-10-CM | POA: Diagnosis not present

## 2015-03-05 DIAGNOSIS — G890 Central pain syndrome: Secondary | ICD-10-CM | POA: Diagnosis not present

## 2015-03-20 DIAGNOSIS — M06242 Rheumatoid bursitis, left hand: Secondary | ICD-10-CM | POA: Diagnosis not present

## 2015-03-20 DIAGNOSIS — M06241 Rheumatoid bursitis, right hand: Secondary | ICD-10-CM | POA: Diagnosis not present

## 2015-03-26 DIAGNOSIS — M7552 Bursitis of left shoulder: Secondary | ICD-10-CM | POA: Diagnosis not present

## 2015-04-04 DIAGNOSIS — G8929 Other chronic pain: Secondary | ICD-10-CM | POA: Diagnosis not present

## 2015-04-04 DIAGNOSIS — Z79891 Long term (current) use of opiate analgesic: Secondary | ICD-10-CM | POA: Diagnosis not present

## 2015-04-04 DIAGNOSIS — M25562 Pain in left knee: Secondary | ICD-10-CM | POA: Diagnosis not present

## 2015-04-04 DIAGNOSIS — M25512 Pain in left shoulder: Secondary | ICD-10-CM | POA: Diagnosis not present

## 2015-04-22 DIAGNOSIS — M069 Rheumatoid arthritis, unspecified: Secondary | ICD-10-CM | POA: Diagnosis not present

## 2015-04-22 DIAGNOSIS — Z720 Tobacco use: Secondary | ICD-10-CM | POA: Diagnosis not present

## 2015-04-22 DIAGNOSIS — Z79899 Other long term (current) drug therapy: Secondary | ICD-10-CM | POA: Diagnosis not present

## 2015-04-22 DIAGNOSIS — Z09 Encounter for follow-up examination after completed treatment for conditions other than malignant neoplasm: Secondary | ICD-10-CM | POA: Diagnosis not present

## 2015-04-22 DIAGNOSIS — M25512 Pain in left shoulder: Secondary | ICD-10-CM | POA: Diagnosis not present

## 2015-05-05 DIAGNOSIS — G8929 Other chronic pain: Secondary | ICD-10-CM | POA: Diagnosis not present

## 2015-05-05 DIAGNOSIS — M25562 Pain in left knee: Secondary | ICD-10-CM | POA: Diagnosis not present

## 2015-05-29 DIAGNOSIS — M79604 Pain in right leg: Secondary | ICD-10-CM | POA: Diagnosis not present

## 2015-05-29 DIAGNOSIS — M79605 Pain in left leg: Secondary | ICD-10-CM | POA: Diagnosis not present

## 2015-05-29 DIAGNOSIS — Z79891 Long term (current) use of opiate analgesic: Secondary | ICD-10-CM | POA: Diagnosis not present

## 2015-05-29 DIAGNOSIS — M545 Low back pain: Secondary | ICD-10-CM | POA: Diagnosis not present

## 2015-05-29 DIAGNOSIS — G890 Central pain syndrome: Secondary | ICD-10-CM | POA: Diagnosis not present

## 2015-07-02 DIAGNOSIS — M25571 Pain in right ankle and joints of right foot: Secondary | ICD-10-CM | POA: Diagnosis not present

## 2015-07-02 DIAGNOSIS — M25572 Pain in left ankle and joints of left foot: Secondary | ICD-10-CM | POA: Diagnosis not present

## 2015-07-02 DIAGNOSIS — G8929 Other chronic pain: Secondary | ICD-10-CM | POA: Diagnosis not present

## 2015-07-23 DIAGNOSIS — I10 Essential (primary) hypertension: Secondary | ICD-10-CM | POA: Diagnosis not present

## 2015-07-23 DIAGNOSIS — Z23 Encounter for immunization: Secondary | ICD-10-CM | POA: Diagnosis not present

## 2015-07-23 DIAGNOSIS — F329 Major depressive disorder, single episode, unspecified: Secondary | ICD-10-CM | POA: Diagnosis not present

## 2015-07-23 DIAGNOSIS — E781 Pure hyperglyceridemia: Secondary | ICD-10-CM | POA: Diagnosis not present

## 2015-07-23 DIAGNOSIS — Z720 Tobacco use: Secondary | ICD-10-CM | POA: Diagnosis not present

## 2015-08-01 DIAGNOSIS — M79671 Pain in right foot: Secondary | ICD-10-CM | POA: Diagnosis not present

## 2015-08-01 DIAGNOSIS — M545 Low back pain: Secondary | ICD-10-CM | POA: Diagnosis not present

## 2015-08-01 DIAGNOSIS — Z79891 Long term (current) use of opiate analgesic: Secondary | ICD-10-CM | POA: Diagnosis not present

## 2015-08-01 DIAGNOSIS — M79605 Pain in left leg: Secondary | ICD-10-CM | POA: Diagnosis not present

## 2015-08-01 DIAGNOSIS — G890 Central pain syndrome: Secondary | ICD-10-CM | POA: Diagnosis not present

## 2015-08-01 DIAGNOSIS — G8929 Other chronic pain: Secondary | ICD-10-CM | POA: Diagnosis not present

## 2015-08-01 DIAGNOSIS — M79604 Pain in right leg: Secondary | ICD-10-CM | POA: Diagnosis not present

## 2015-08-01 DIAGNOSIS — M79672 Pain in left foot: Secondary | ICD-10-CM | POA: Diagnosis not present

## 2015-08-05 DIAGNOSIS — Z79891 Long term (current) use of opiate analgesic: Secondary | ICD-10-CM | POA: Diagnosis not present

## 2015-08-05 DIAGNOSIS — M545 Low back pain: Secondary | ICD-10-CM | POA: Diagnosis not present

## 2015-08-05 DIAGNOSIS — G8929 Other chronic pain: Secondary | ICD-10-CM | POA: Diagnosis not present

## 2015-08-27 DIAGNOSIS — G8929 Other chronic pain: Secondary | ICD-10-CM | POA: Diagnosis not present

## 2015-08-27 DIAGNOSIS — Z79899 Other long term (current) drug therapy: Secondary | ICD-10-CM | POA: Diagnosis not present

## 2015-08-27 DIAGNOSIS — M545 Low back pain: Secondary | ICD-10-CM | POA: Diagnosis not present

## 2015-08-27 DIAGNOSIS — M542 Cervicalgia: Secondary | ICD-10-CM | POA: Diagnosis not present

## 2015-09-24 DIAGNOSIS — M545 Low back pain: Secondary | ICD-10-CM | POA: Diagnosis not present

## 2015-09-24 DIAGNOSIS — G8929 Other chronic pain: Secondary | ICD-10-CM | POA: Diagnosis not present

## 2015-09-24 DIAGNOSIS — M5432 Sciatica, left side: Secondary | ICD-10-CM | POA: Diagnosis not present

## 2015-09-24 DIAGNOSIS — M5431 Sciatica, right side: Secondary | ICD-10-CM | POA: Diagnosis not present

## 2015-10-23 DIAGNOSIS — M0579 Rheumatoid arthritis with rheumatoid factor of multiple sites without organ or systems involvement: Secondary | ICD-10-CM | POA: Diagnosis not present

## 2015-10-23 DIAGNOSIS — M170 Bilateral primary osteoarthritis of knee: Secondary | ICD-10-CM | POA: Diagnosis not present

## 2015-10-23 DIAGNOSIS — Z79899 Other long term (current) drug therapy: Secondary | ICD-10-CM | POA: Diagnosis not present

## 2015-10-23 DIAGNOSIS — E6609 Other obesity due to excess calories: Secondary | ICD-10-CM | POA: Diagnosis not present

## 2015-11-05 DIAGNOSIS — G8929 Other chronic pain: Secondary | ICD-10-CM | POA: Diagnosis not present

## 2015-11-05 DIAGNOSIS — G894 Chronic pain syndrome: Secondary | ICD-10-CM | POA: Diagnosis not present

## 2015-11-25 DIAGNOSIS — Z79899 Other long term (current) drug therapy: Secondary | ICD-10-CM | POA: Diagnosis not present

## 2015-11-26 DIAGNOSIS — M79604 Pain in right leg: Secondary | ICD-10-CM | POA: Diagnosis not present

## 2015-11-26 DIAGNOSIS — Z79891 Long term (current) use of opiate analgesic: Secondary | ICD-10-CM | POA: Diagnosis not present

## 2015-11-26 DIAGNOSIS — G894 Chronic pain syndrome: Secondary | ICD-10-CM | POA: Diagnosis not present

## 2015-11-26 DIAGNOSIS — M79605 Pain in left leg: Secondary | ICD-10-CM | POA: Diagnosis not present

## 2015-11-26 DIAGNOSIS — M545 Low back pain: Secondary | ICD-10-CM | POA: Diagnosis not present

## 2015-12-24 DIAGNOSIS — M545 Low back pain: Secondary | ICD-10-CM | POA: Diagnosis not present

## 2015-12-24 DIAGNOSIS — M25512 Pain in left shoulder: Secondary | ICD-10-CM | POA: Diagnosis not present

## 2015-12-24 DIAGNOSIS — M79605 Pain in left leg: Secondary | ICD-10-CM | POA: Diagnosis not present

## 2015-12-24 DIAGNOSIS — M79604 Pain in right leg: Secondary | ICD-10-CM | POA: Diagnosis not present

## 2015-12-24 DIAGNOSIS — Z79891 Long term (current) use of opiate analgesic: Secondary | ICD-10-CM | POA: Diagnosis not present

## 2015-12-24 DIAGNOSIS — G894 Chronic pain syndrome: Secondary | ICD-10-CM | POA: Diagnosis not present

## 2016-01-15 DIAGNOSIS — L03115 Cellulitis of right lower limb: Secondary | ICD-10-CM | POA: Diagnosis not present

## 2016-01-15 DIAGNOSIS — R600 Localized edema: Secondary | ICD-10-CM | POA: Diagnosis not present

## 2016-01-16 ENCOUNTER — Emergency Department (HOSPITAL_BASED_OUTPATIENT_CLINIC_OR_DEPARTMENT_OTHER): Admit: 2016-01-16 | Discharge: 2016-01-16 | Disposition: A | Discharge status: Home / Self Care | Payer: Medicare Other

## 2016-01-16 ENCOUNTER — Emergency Department (HOSPITAL_COMMUNITY)
Admission: EM | Admit: 2016-01-16 | Discharge: 2016-01-16 | Disposition: A | Discharge status: Home / Self Care | Payer: Medicare Other | Attending: Emergency Medicine | Admitting: Emergency Medicine

## 2016-01-16 ENCOUNTER — Encounter (HOSPITAL_COMMUNITY): Payer: Self-pay | Admitting: *Deleted

## 2016-01-16 DIAGNOSIS — I10 Essential (primary) hypertension: Secondary | ICD-10-CM | POA: Diagnosis not present

## 2016-01-16 DIAGNOSIS — Z79899 Other long term (current) drug therapy: Secondary | ICD-10-CM | POA: Diagnosis not present

## 2016-01-16 DIAGNOSIS — E785 Hyperlipidemia, unspecified: Secondary | ICD-10-CM | POA: Diagnosis not present

## 2016-01-16 DIAGNOSIS — L539 Erythematous condition, unspecified: Secondary | ICD-10-CM | POA: Diagnosis not present

## 2016-01-16 DIAGNOSIS — M797 Fibromyalgia: Secondary | ICD-10-CM | POA: Diagnosis not present

## 2016-01-16 DIAGNOSIS — Z791 Long term (current) use of non-steroidal anti-inflammatories (NSAID): Secondary | ICD-10-CM | POA: Diagnosis not present

## 2016-01-16 DIAGNOSIS — R600 Localized edema: Secondary | ICD-10-CM | POA: Insufficient documentation

## 2016-01-16 DIAGNOSIS — F17210 Nicotine dependence, cigarettes, uncomplicated: Secondary | ICD-10-CM | POA: Insufficient documentation

## 2016-01-16 DIAGNOSIS — M7989 Other specified soft tissue disorders: Secondary | ICD-10-CM

## 2016-01-16 DIAGNOSIS — M069 Rheumatoid arthritis, unspecified: Secondary | ICD-10-CM | POA: Diagnosis not present

## 2016-01-16 LAB — CBC WITH DIFFERENTIAL/PLATELET
Basophils Absolute: 0 10*3/uL (ref 0.0–0.1)
Basophils Relative: 0 %
EOS PCT: 1 %
Eosinophils Absolute: 0.1 10*3/uL (ref 0.0–0.7)
HCT: 35.6 % — ABNORMAL LOW (ref 36.0–46.0)
Hemoglobin: 12.3 g/dL (ref 12.0–15.0)
LYMPHS ABS: 2.8 10*3/uL (ref 0.7–4.0)
LYMPHS PCT: 38 %
MCH: 28.5 pg (ref 26.0–34.0)
MCHC: 34.6 g/dL (ref 30.0–36.0)
MCV: 82.4 fL (ref 78.0–100.0)
MONO ABS: 0.4 10*3/uL (ref 0.1–1.0)
MONOS PCT: 6 %
Neutro Abs: 3.9 10*3/uL (ref 1.7–7.7)
Neutrophils Relative %: 55 %
Platelets: 352 10*3/uL (ref 150–400)
RBC: 4.32 MIL/uL (ref 3.87–5.11)
RDW: 15.9 % — ABNORMAL HIGH (ref 11.5–15.5)
WBC: 7.2 10*3/uL (ref 4.0–10.5)

## 2016-01-16 LAB — BASIC METABOLIC PANEL
Anion gap: 7 (ref 5–15)
BUN: 7 mg/dL (ref 6–20)
CHLORIDE: 105 mmol/L (ref 101–111)
CO2: 26 mmol/L (ref 22–32)
CREATININE: 0.61 mg/dL (ref 0.44–1.00)
Calcium: 9.3 mg/dL (ref 8.9–10.3)
GFR calc Af Amer: >60 mL/min (ref >60)
GFR calc non Af Amer: >60 mL/min (ref >60)
GLUCOSE: 121 mg/dL — AB (ref 65–99)
Potassium: 3.4 mmol/L — ABNORMAL LOW (ref 3.5–5.1)
Sodium: 138 mmol/L (ref 135–145)

## 2016-01-16 MED ORDER — SULFAMETHOXAZOLE-TRIMETHOPRIM 800-160 MG PO TABS: 2.0000 | ORAL_TABLET | Freq: Two times a day (BID) | ORAL | Status: AC

## 2016-01-16 NOTE — ED Notes (Signed)
Pt transported to vascular.  °

## 2016-01-16 NOTE — Progress Notes (Signed)
*  PRELIMINARY RESULTS* Vascular Ultrasound Right lower extremity venous duplex has been completed.  Preliminary findings: no obvious evidence of DVT or baker's cyst.   Landry Mellow, RDMS, RVT  01/16/2016, 12:49 PM

## 2016-01-16 NOTE — ED Notes (Signed)
Pt sent here by her rheumatologist for r/t dvt r/t RLE swelling and pain.

## 2016-01-16 NOTE — Discharge Instructions (Signed)
Your leg could possibly be read due to an infection or a superficial blood clot. You'll be treated for both. Your antibiotics for the infection, please take as prescribed and do not save or share them. Please take a 325 mg aspirin daily and follow up with your doctor in 3 days for a wound recheck. Return to ED for fevers, chills, spreading of the redness, cough, shortness of breath or other worsening or concerning symptoms.

## 2016-01-16 NOTE — ED Provider Notes (Signed)
CSN: [id number]     Arrival date & time 01/16/16  1109 History   First MD Initiated Contact with Patient 01/16/16 1141     Chief Complaint  Patient presents with  . Leg Swelling     (Consider location/radiation/quality/duration/timing/severity/associated sxs/prior Treatment) HPI Shelby Robertson is a 45 y.o. female with a history of hypertension, hyperlipidemia, rheumatoid arthritis, morbid obesity, comes in for evaluation of right lower leg swelling. Patient reports she was sent by her rheumatologist for rule out of DVT. She reports her the past 3 days she has noticed increased redness and swelling in her right lower extremity. She denies any cardiopulmonary symptoms. No fevers, chest pain, shortness of breath. She denies any recent immobilizations, exogenous estrogen use, cough or hemoptysis. Never had a blood clot before. No other alleviating or modifying factors.  Past Medical History  Diagnosis Date  . Hypertension   . Fibromyalgia   . Osteoarthritis   . Fibromyalgia   . Rheumatoid arthritis(714.0)   . Rheumatoid arthritis(714.0)    History reviewed. No pertinent past surgical history. No family history on file. Social History  Substance Use Topics  . Smoking status: Current Every Day Smoker -- 0.50 packs/day    Types: Cigarettes  . Smokeless tobacco: None  . Alcohol Use: No   OB History    No data available     Review of Systems A 10 point review of systems was completed and was negative except for pertinent positives and negatives as mentioned in the history of present illness     Allergies  Review of patient's allergies indicates no known allergies.  Home Medications   Prior to Admission medications   Medication Sig Start Date End Date Taking? Authorizing Provider  acetaminophen (TYLENOL) 325 MG tablet Take 650 mg by mouth every 6 (six) hours as needed for pain.   Yes Historical Provider, MD  amLODipine-olmesartan (AZOR) 10-40 MG tablet Take 1 tablet by mouth  daily. 11/25/15  Yes Historical Provider, MD  DULoxetine (CYMBALTA) 60 MG capsule Take 60 mg by mouth daily.     Yes Historical Provider, MD  folic acid (FOLVITE) 1 MG tablet Take 2 mg by mouth daily.   Yes Historical Provider, MD  furosemide (LASIX) 20 MG tablet Take 1 tablet by mouth daily. 01/20/15  Yes Historical Provider, MD  HYDROcodone-acetaminophen (NORCO) 10-325 MG per tablet Take 1 tablet by mouth every 6 (six) hours as needed for moderate pain.    Yes Historical Provider, MD  ibuprofen (ADVIL,MOTRIN) 200 MG tablet Take 400 mg by mouth every 6 (six) hours as needed for pain.   Yes Historical Provider, MD  meloxicam (MOBIC) 15 MG tablet Take 15 mg by mouth daily.     Yes Historical Provider, MD  Methotrexate Sodium (METHOTREXATE, PF,) 50 MG/2ML injection Inject 50 mg into the muscle once a week. 10/28/15  Yes Historical Provider, MD  tiZANidine (ZANAFLEX) 4 MG tablet Take 4 mg by mouth every 6 (six) hours as needed (for muscle spasms).    Yes Historical Provider, MD  ZETIA 10 MG tablet Take 1 tablet by mouth daily. 12/18/14  Yes Historical Provider, MD  Adalimumab (HUMIRA PEN [place]) Inject into the skin. One injection every 2 weeks. Patient is not sure how much she injects    Historical Provider, MD  diclofenac sodium (VOLTAREN) 1 % GEL Apply 4 g topically 4 (four) times daily. Please instruct in dosing. Patient not taking: Reported on 01/16/2016 01/28/15   Billy Fischer, MD  sulfamethoxazole-trimethoprim (BACTRIM DS,SEPTRA  DS) 800-160 MG tablet Take 2 tablets by mouth 2 (two) times daily. 01/16/16 01/23/16  Comer Locket, PA-C  traMADol (ULTRAM) 50 MG tablet Take 1 tablet (50 mg total) by mouth every 6 (six) hours as needed. Patient not taking: Reported on 01/16/2016 01/28/15   Junius Creamer, NP   BP 146/89 mmHg  Pulse 87  Temp(Src) 98.7 F (37.1 C) (Oral)  Resp 18  Ht 5\' 5"  (1.651 m)  Wt 146.058 kg  BMI 53.58 kg/m2  SpO2 95%  LMP 12/07/2015 Physical Exam  Constitutional: She is oriented to  person, place, and time. She appears well-developed and well-nourished.  Morbidly obese African-American female  HENT:  Head: Normocephalic and atraumatic.  Mouth/Throat: Oropharynx is clear and moist.  Eyes: Conjunctivae are normal. Pupils are equal, round, and reactive to light. Right eye exhibits no discharge. Left eye exhibits no discharge. No scleral icterus.  Neck: Neck supple.  Cardiovascular: Normal rate, regular rhythm and normal heart sounds.   Pulmonary/Chest: Effort normal and breath sounds normal. No respiratory distress. She has no wheezes. She has no rales.  Abdominal: Soft. There is no tenderness.  Musculoskeletal: She exhibits edema and tenderness.  Diffusely swollen, tight right lower extremity is erythematous and tender to touch. Distal pulses intact. No abnormalities of right knee, full range of motion.  Neurological: She is alert and oriented to person, place, and time.  Cranial Nerves II-XII grossly intact  Skin: Skin is warm and dry. No rash noted.  Psychiatric: She has a normal mood and affect.  Nursing note and vitals reviewed.   ED Course  Procedures (including critical care time) Labs Review Labs Reviewed  BASIC METABOLIC PANEL - Abnormal; Notable for the following:    Potassium 3.4 (*)    Glucose, Bld 121 (*)    All other components within normal limits  CBC WITH DIFFERENTIAL/PLATELET - Abnormal; Notable for the following:    HCT 35.6 (*)    RDW 15.9 (*)    All other components within normal limits    Imaging Review No results found. I have personally reviewed and evaluated these images and lab results as part of my medical decision-making.   EKG Interpretation None     Meds given in ED:  Medications - No data to display  Discharge Medication List as of 01/16/2016  3:51 PM    START taking these medications   Details  sulfamethoxazole-trimethoprim (BACTRIM DS,SEPTRA DS) 800-160 MG tablet Take 2 tablets by mouth 2 (two) times daily., Starting  01/16/2016, Until Fri 01/23/16, Print       Filed Vitals:   01/16/16 1515 01/16/16 1524 01/16/16 1530 01/16/16 1613  BP: 145/80  140/94 146/89  Pulse: 105 100 100 87  Temp:    98.7 F (37.1 C)  TempSrc:    Oral  Resp:    18  Height:      Weight:      SpO2:  94%  95%    MDM  Shelby Robertson is a 45 y.o. female with history of hypertension, hyperlipidemia, rheumatoid arthritis and morbid obesity comes in for evaluation of right lower leg pain with redness and swelling. Patient sent by rheumatologist for rule out of DVT. Initial tachycardia on arrival has resolved spontaneously. She remains afebrile and hemodynamically stable. On exam, she does have a slightly tender, warm, erythematous right lower extremity distal tibia. Area is circumferential. She has full active range of motion of knee and ankle. Low suspicion for a septic joint or a compartment syndrome. Ultrasound  of lower extremity is negative for any DVT or cyst. Screening labs without leukocytosis or other concerning findings. Patient with possible superficial thrombophlebitis versus cellulitis. Doubt PE, 100% oxygen on room air with no cardiopulmonary complaints. Suspect tachycardia secondary to dehydration. Discussed aspirin therapy home, will DC with Keflex and have patient follow-up with PCP in 3 days for wound recheck. Discussed return precautions. She verbalizes understanding and agrees with this plan as well as subsequent discharge. Prior to patient discharge, I discussed and reviewed this case with Dr.James   Final diagnoses:  Leg erythema        Comer Locket, PA-C 01/17/16 [id number]  Tanna Furry, MD 01/22/16 2328

## 2016-01-16 NOTE — ED Notes (Addendum)
Patient transported to vascular. 

## 2016-01-16 NOTE — ED Notes (Signed)
Patient verbalized understanding of discharge instructions and denies any further needs or questions at this time. VS stable. Patient ambulatory with steady gait. Assisted to ED entrance in wheelchair.   

## 2016-01-22 DIAGNOSIS — R609 Edema, unspecified: Secondary | ICD-10-CM | POA: Diagnosis not present

## 2016-01-22 DIAGNOSIS — R0600 Dyspnea, unspecified: Secondary | ICD-10-CM | POA: Diagnosis not present

## 2016-01-29 DIAGNOSIS — G603 Idiopathic progressive neuropathy: Secondary | ICD-10-CM | POA: Diagnosis not present

## 2016-01-29 DIAGNOSIS — M545 Low back pain: Secondary | ICD-10-CM | POA: Diagnosis not present

## 2016-01-29 DIAGNOSIS — M5431 Sciatica, right side: Secondary | ICD-10-CM | POA: Diagnosis not present

## 2016-01-29 DIAGNOSIS — G8929 Other chronic pain: Secondary | ICD-10-CM | POA: Diagnosis not present

## 2016-01-29 DIAGNOSIS — G541 Lumbosacral plexus disorders: Secondary | ICD-10-CM | POA: Diagnosis not present

## 2016-01-29 DIAGNOSIS — M5432 Sciatica, left side: Secondary | ICD-10-CM | POA: Diagnosis not present

## 2016-02-11 DIAGNOSIS — R609 Edema, unspecified: Secondary | ICD-10-CM | POA: Diagnosis not present

## 2016-02-26 DIAGNOSIS — M25512 Pain in left shoulder: Secondary | ICD-10-CM | POA: Diagnosis not present

## 2016-02-26 DIAGNOSIS — M545 Low back pain: Secondary | ICD-10-CM | POA: Diagnosis not present

## 2016-02-26 DIAGNOSIS — G894 Chronic pain syndrome: Secondary | ICD-10-CM | POA: Diagnosis not present

## 2016-02-26 DIAGNOSIS — M79605 Pain in left leg: Secondary | ICD-10-CM | POA: Diagnosis not present

## 2016-02-26 DIAGNOSIS — Z79891 Long term (current) use of opiate analgesic: Secondary | ICD-10-CM | POA: Diagnosis not present

## 2016-02-26 DIAGNOSIS — M79604 Pain in right leg: Secondary | ICD-10-CM | POA: Diagnosis not present

## 2016-03-22 DIAGNOSIS — Z09 Encounter for follow-up examination after completed treatment for conditions other than malignant neoplasm: Secondary | ICD-10-CM | POA: Diagnosis not present

## 2016-03-22 DIAGNOSIS — Z79899 Other long term (current) drug therapy: Secondary | ICD-10-CM | POA: Diagnosis not present

## 2016-03-22 DIAGNOSIS — R5381 Other malaise: Secondary | ICD-10-CM | POA: Diagnosis not present

## 2016-03-22 DIAGNOSIS — M0579 Rheumatoid arthritis with rheumatoid factor of multiple sites without organ or systems involvement: Secondary | ICD-10-CM | POA: Diagnosis not present

## 2016-03-22 DIAGNOSIS — M797 Fibromyalgia: Secondary | ICD-10-CM | POA: Diagnosis not present

## 2016-03-24 ENCOUNTER — Ambulatory Visit
Admission: RE | Admit: 2016-03-24 | Discharge: 2016-03-24 | Disposition: A | Discharge status: Home / Self Care | Payer: Medicare Other | Source: Ambulatory Visit | Attending: Internal Medicine | Admitting: Internal Medicine

## 2016-03-24 ENCOUNTER — Other Ambulatory Visit: Payer: Self-pay | Admitting: Nurse Practitioner

## 2016-03-24 DIAGNOSIS — R52 Pain, unspecified: Secondary | ICD-10-CM

## 2016-03-24 DIAGNOSIS — R600 Localized edema: Secondary | ICD-10-CM | POA: Diagnosis not present

## 2016-03-25 DIAGNOSIS — Z79891 Long term (current) use of opiate analgesic: Secondary | ICD-10-CM | POA: Diagnosis not present

## 2016-03-25 DIAGNOSIS — G894 Chronic pain syndrome: Secondary | ICD-10-CM | POA: Diagnosis not present

## 2016-03-25 DIAGNOSIS — M25512 Pain in left shoulder: Secondary | ICD-10-CM | POA: Diagnosis not present

## 2016-03-25 DIAGNOSIS — M5431 Sciatica, right side: Secondary | ICD-10-CM | POA: Diagnosis not present

## 2016-03-25 DIAGNOSIS — M545 Low back pain: Secondary | ICD-10-CM | POA: Diagnosis not present

## 2016-03-25 DIAGNOSIS — M79604 Pain in right leg: Secondary | ICD-10-CM | POA: Diagnosis not present

## 2016-03-25 DIAGNOSIS — M79605 Pain in left leg: Secondary | ICD-10-CM | POA: Diagnosis not present

## 2016-03-25 DIAGNOSIS — M5432 Sciatica, left side: Secondary | ICD-10-CM | POA: Diagnosis not present

## 2016-03-26 DIAGNOSIS — Z79899 Other long term (current) drug therapy: Secondary | ICD-10-CM | POA: Diagnosis not present

## 2016-04-29 DIAGNOSIS — M545 Low back pain: Secondary | ICD-10-CM | POA: Diagnosis not present

## 2016-04-29 DIAGNOSIS — Z79891 Long term (current) use of opiate analgesic: Secondary | ICD-10-CM | POA: Diagnosis not present

## 2016-04-29 DIAGNOSIS — M5431 Sciatica, right side: Secondary | ICD-10-CM | POA: Diagnosis not present

## 2016-04-29 DIAGNOSIS — G8929 Other chronic pain: Secondary | ICD-10-CM | POA: Diagnosis not present

## 2016-04-29 DIAGNOSIS — M5432 Sciatica, left side: Secondary | ICD-10-CM | POA: Diagnosis not present

## 2016-04-29 DIAGNOSIS — G894 Chronic pain syndrome: Secondary | ICD-10-CM | POA: Diagnosis not present

## 2016-06-01 DIAGNOSIS — M5442 Lumbago with sciatica, left side: Secondary | ICD-10-CM | POA: Diagnosis not present

## 2016-06-01 DIAGNOSIS — G8929 Other chronic pain: Secondary | ICD-10-CM | POA: Diagnosis not present

## 2016-06-01 DIAGNOSIS — M5441 Lumbago with sciatica, right side: Secondary | ICD-10-CM | POA: Diagnosis not present

## 2016-06-01 DIAGNOSIS — M545 Low back pain: Secondary | ICD-10-CM | POA: Diagnosis not present

## 2016-06-01 DIAGNOSIS — Z79891 Long term (current) use of opiate analgesic: Secondary | ICD-10-CM | POA: Diagnosis not present

## 2016-06-01 DIAGNOSIS — G894 Chronic pain syndrome: Secondary | ICD-10-CM | POA: Diagnosis not present

## 2016-06-08 DIAGNOSIS — M25561 Pain in right knee: Secondary | ICD-10-CM | POA: Diagnosis not present

## 2016-06-08 DIAGNOSIS — Z79891 Long term (current) use of opiate analgesic: Secondary | ICD-10-CM | POA: Diagnosis not present

## 2016-06-29 DIAGNOSIS — M79604 Pain in right leg: Secondary | ICD-10-CM | POA: Diagnosis not present

## 2016-06-29 DIAGNOSIS — M545 Low back pain: Secondary | ICD-10-CM | POA: Diagnosis not present

## 2016-06-29 DIAGNOSIS — M79672 Pain in left foot: Secondary | ICD-10-CM | POA: Diagnosis not present

## 2016-06-29 DIAGNOSIS — M79671 Pain in right foot: Secondary | ICD-10-CM | POA: Diagnosis not present

## 2016-06-29 DIAGNOSIS — Z79891 Long term (current) use of opiate analgesic: Secondary | ICD-10-CM | POA: Diagnosis not present

## 2016-06-29 DIAGNOSIS — M79605 Pain in left leg: Secondary | ICD-10-CM | POA: Diagnosis not present

## 2016-06-29 DIAGNOSIS — G894 Chronic pain syndrome: Secondary | ICD-10-CM | POA: Diagnosis not present

## 2016-07-08 DIAGNOSIS — Z79899 Other long term (current) drug therapy: Secondary | ICD-10-CM | POA: Diagnosis not present

## 2016-08-02 DIAGNOSIS — M79672 Pain in left foot: Secondary | ICD-10-CM | POA: Diagnosis not present

## 2016-08-02 DIAGNOSIS — M79604 Pain in right leg: Secondary | ICD-10-CM | POA: Diagnosis not present

## 2016-08-02 DIAGNOSIS — G894 Chronic pain syndrome: Secondary | ICD-10-CM | POA: Diagnosis not present

## 2016-08-02 DIAGNOSIS — M79662 Pain in left lower leg: Secondary | ICD-10-CM | POA: Diagnosis not present

## 2016-08-02 DIAGNOSIS — M79605 Pain in left leg: Secondary | ICD-10-CM | POA: Diagnosis not present

## 2016-08-02 DIAGNOSIS — M79661 Pain in right lower leg: Secondary | ICD-10-CM | POA: Diagnosis not present

## 2016-08-02 DIAGNOSIS — Z79891 Long term (current) use of opiate analgesic: Secondary | ICD-10-CM | POA: Diagnosis not present

## 2016-08-02 DIAGNOSIS — M79671 Pain in right foot: Secondary | ICD-10-CM | POA: Diagnosis not present

## 2016-08-02 DIAGNOSIS — M5416 Radiculopathy, lumbar region: Secondary | ICD-10-CM | POA: Diagnosis not present

## 2016-08-09 ENCOUNTER — Other Ambulatory Visit: Payer: Self-pay | Admitting: Rheumatology

## 2016-08-09 DIAGNOSIS — R0683 Snoring: Secondary | ICD-10-CM | POA: Diagnosis not present

## 2016-08-09 DIAGNOSIS — R000 Tachycardia, unspecified: Secondary | ICD-10-CM | POA: Diagnosis not present

## 2016-08-09 DIAGNOSIS — I10 Essential (primary) hypertension: Secondary | ICD-10-CM | POA: Diagnosis not present

## 2016-08-09 DIAGNOSIS — Z23 Encounter for immunization: Secondary | ICD-10-CM | POA: Diagnosis not present

## 2016-08-09 NOTE — Telephone Encounter (Signed)
Patient states she needs refills of methotrexate and Humira. She now uses [facility].

## 2016-08-10 MED ORDER — METHOTREXATE SODIUM CHEMO INJECTION 50 MG/2ML: INTRAMUSCULAR | 0 refills | Status: DC

## 2016-08-10 MED ORDER — ADALIMUMAB 40 MG/0.8ML ~~LOC~~ AJKT: 1.0000 "pen " | AUTO-INJECTOR | SUBCUTANEOUS | 2 refills | Status: DC

## 2016-08-10 NOTE — Telephone Encounter (Signed)
03/22/16 last visit 08/24/16 next visit  Labs WNL 07/09/16 TB gold neg 06/22/16

## 2016-08-11 ENCOUNTER — Other Ambulatory Visit: Payer: Self-pay | Admitting: Radiology

## 2016-08-11 ENCOUNTER — Telehealth: Payer: Self-pay | Admitting: Rheumatology

## 2016-08-11 DIAGNOSIS — M0609 Rheumatoid arthritis without rheumatoid factor, multiple sites: Secondary | ICD-10-CM

## 2016-08-11 MED ORDER — METHOTREXATE SODIUM CHEMO INJECTION 50 MG/2ML: INTRAMUSCULAR | 0 refills | Status: DC

## 2016-08-11 NOTE — Telephone Encounter (Signed)
Pharmacy has called and states patients MTX normally for a 12 week supply, which is 12/ have resent for the correct amount.

## 2016-08-11 NOTE — Telephone Encounter (Signed)
Debi w/[facility] states pt needs a prio auth for her Humira. Cb# quickest way to complete, [phone]

## 2016-08-12 DIAGNOSIS — M0609 Rheumatoid arthritis without rheumatoid factor, multiple sites: Secondary | ICD-10-CM | POA: Insufficient documentation

## 2016-08-12 NOTE — Telephone Encounter (Signed)
Have completed PA request via cover my meds and it was approved.

## 2016-08-13 ENCOUNTER — Telehealth: Payer: Self-pay | Admitting: Rheumatology

## 2016-08-13 ENCOUNTER — Encounter (HOSPITAL_COMMUNITY): Payer: Self-pay | Admitting: Emergency Medicine

## 2016-08-13 ENCOUNTER — Emergency Department (HOSPITAL_COMMUNITY)
Admission: EM | Admit: 2016-08-13 | Discharge: 2016-08-13 | Disposition: A | Discharge status: Home / Self Care | Payer: Medicare Other | Attending: Emergency Medicine | Admitting: Emergency Medicine

## 2016-08-13 ENCOUNTER — Other Ambulatory Visit (HOSPITAL_BASED_OUTPATIENT_CLINIC_OR_DEPARTMENT_OTHER): Payer: Self-pay

## 2016-08-13 DIAGNOSIS — Y939 Activity, unspecified: Secondary | ICD-10-CM | POA: Insufficient documentation

## 2016-08-13 DIAGNOSIS — G4710 Hypersomnia, unspecified: Secondary | ICD-10-CM

## 2016-08-13 DIAGNOSIS — F17210 Nicotine dependence, cigarettes, uncomplicated: Secondary | ICD-10-CM | POA: Insufficient documentation

## 2016-08-13 DIAGNOSIS — M25561 Pain in right knee: Secondary | ICD-10-CM | POA: Diagnosis not present

## 2016-08-13 DIAGNOSIS — I10 Essential (primary) hypertension: Secondary | ICD-10-CM | POA: Diagnosis not present

## 2016-08-13 DIAGNOSIS — Z79899 Other long term (current) drug therapy: Secondary | ICD-10-CM | POA: Diagnosis not present

## 2016-08-13 DIAGNOSIS — Y929 Unspecified place or not applicable: Secondary | ICD-10-CM | POA: Diagnosis not present

## 2016-08-13 DIAGNOSIS — X501XXA Overexertion from prolonged static or awkward postures, initial encounter: Secondary | ICD-10-CM | POA: Diagnosis not present

## 2016-08-13 DIAGNOSIS — R0683 Snoring: Secondary | ICD-10-CM

## 2016-08-13 DIAGNOSIS — Y999 Unspecified external cause status: Secondary | ICD-10-CM | POA: Insufficient documentation

## 2016-08-13 DIAGNOSIS — R5383 Other fatigue: Secondary | ICD-10-CM

## 2016-08-13 MED ORDER — ONDANSETRON 4 MG PO TBDP
8.0000 mg | ORAL_TABLET | Freq: Once | ORAL | Status: AC
  Administered 2016-08-13: 8 mg via ORAL
  Filled 2016-08-13: qty 2

## 2016-08-13 MED ORDER — HYDROMORPHONE HCL 2 MG/ML IJ SOLN
2.0000 mg | Freq: Once | INTRAMUSCULAR | Status: AC
Start: 2016-08-13 — End: 2016-08-13
  Administered 2016-08-13: 2 mg via INTRAMUSCULAR
  Filled 2016-08-13: qty 1

## 2016-08-13 MED ORDER — METHOTREXATE SODIUM CHEMO INJECTION 50 MG/2ML: INTRAMUSCULAR | 0 refills | Status: DC

## 2016-08-13 MED ORDER — DEXAMETHASONE 4 MG PO TABS
12.0000 mg | ORAL_TABLET | Freq: Once | ORAL | Status: AC
  Administered 2016-08-13: 12 mg via ORAL
  Filled 2016-08-13: qty 3

## 2016-08-13 NOTE — Telephone Encounter (Signed)
Re-sent to [facility]

## 2016-08-13 NOTE — ED Triage Notes (Addendum)
Patient here with c/o right knee pain. Patient states she has rheumatoid arthritis and can't get appointment for rheumatologist. States they "usually give me a shot in it". Patient ambulatory and alert and oriented x 4. No swelling/deformity noted.

## 2016-08-13 NOTE — ED Notes (Signed)
Pt Computer shut down during D/C unable to get signature

## 2016-08-13 NOTE — Discharge Instructions (Signed)
Continue using your current medications. Apply ice or heat as needed.

## 2016-08-13 NOTE — ED Provider Notes (Signed)
[facility] DEPT Provider Note   CSN: [id number] Arrival date & time: 08/13/16  0126  By signing my name below, I, Emmanuella Mensah, attest that this documentation has been prepared under the direction and in the presence of Delora Fuel, MD. Electronically Signed: Judithann Sauger, ED Scribe. 08/13/16. 1:52 AM.   History   Chief Complaint Chief Complaint  Patient presents with  . Knee Pain    HPI Comments: Shelby Robertson is a 45 y.o. female with a hx of hypertension, fibromyalgia, osteoarthritis, bilateral leg edema, and rheumatoid arthritis who presents to the Emergency Department complaining of gradually worsening 7/10 pain to her right knee she describes as throbbing consistent with her rheumatoid arthritis exacerbations onset one week ago. She reports that when she ambulates, she intermittently hears a "pop" in her right knee. She notes that movement makes the pain worse and staying still seems to help. No other alleviating factors noted. Pt has not tried any specific medications for these symptoms; stating that "I just take my regular medicines" with no relief. Pt is currently on Humira, Methotrexate, Vicodin, and Meloxicam. She has NKDA. She denies any known falls, injuries, or trauma. She explains that she normally receives intra-articular injections at her rheumatologist with these exacerbations. She adds that her last injection was approximately 5 months and it provided relief at that time. She denies any fever, chills, swelling to the knee, open wounds, or any other symptoms.   PCP: Dr. Laurance Flatten  Rheumatologist: Dr. Estanislado Pandy. Pt has upcoming appointment next month.   The history is provided by the patient. No language interpreter was used.    Past Medical History:  Diagnosis Date  . Fibromyalgia   . Fibromyalgia   . Hypertension   . Osteoarthritis   . Rheumatoid arthritis(714.0)   . Rheumatoid arthritis(714.0)     Patient Active Problem List   Diagnosis Date Noted    . Rheumatoid arthritis of multiple sites without rheumatoid factor ([place]) 08/12/2016  . ARTHRITIS, GENERALIZED 07/14/2010  . UTI 10/27/2009  . FURUNCULOSIS 08/19/2009  . HYPERTENSION, BENIGN ESSENTIAL 03/04/2009  . POLYURIA 03/04/2009  . FIBROMYALGIA 11/09/2007  . HEADACHE 11/09/2007  . HIDRADENITIS SUPPURATIVA 07/11/2007  . DYSLIPIDEMIA 06/28/2007  . LEG EDEMA, BILATERAL 05/05/2007    No past surgical history on file.  OB History    No data available       Home Medications    Prior to Admission medications   Medication Sig Start Date End Date Taking? Authorizing Provider  acetaminophen (TYLENOL) 325 MG tablet Take 650 mg by mouth every 6 (six) hours as needed for pain.    Historical Provider, MD  Adalimumab (HUMIRA PEN) 40 MG/0.8ML PNKT Inject 1 pen into the skin every 14 (fourteen) days. 08/10/16   Bo Merino, MD  amLODipine-olmesartan (AZOR) 10-40 MG tablet Take 1 tablet by mouth daily. 11/25/15   Historical Provider, MD  diclofenac sodium (VOLTAREN) 1 % GEL Apply 4 g topically 4 (four) times daily. Please instruct in dosing. Patient not taking: Reported on 01/16/2016 01/28/15   Billy Fischer, MD  DULoxetine (CYMBALTA) 60 MG capsule Take 60 mg by mouth daily.      Historical Provider, MD  folic acid (FOLVITE) 1 MG tablet Take 2 mg by mouth daily.    Historical Provider, MD  furosemide (LASIX) 20 MG tablet Take 1 tablet by mouth daily. 01/20/15   Historical Provider, MD  HYDROcodone-acetaminophen (NORCO) 10-325 MG per tablet Take 1 tablet by mouth every 6 (six) hours as needed for moderate pain.  Historical Provider, MD  ibuprofen (ADVIL,MOTRIN) 200 MG tablet Take 400 mg by mouth every 6 (six) hours as needed for pain.    Historical Provider, MD  meloxicam (MOBIC) 15 MG tablet Take 15 mg by mouth daily.      Historical Provider, MD  methotrexate 50 MG/2ML injection Inject 0.8 mL sub Q once a week 08/11/16   Bo Merino, MD  tiZANidine (ZANAFLEX) 4 MG tablet Take 4 mg  by mouth every 6 (six) hours as needed (for muscle spasms).     Historical Provider, MD  traMADol (ULTRAM) 50 MG tablet Take 1 tablet (50 mg total) by mouth every 6 (six) hours as needed. Patient not taking: Reported on 01/16/2016 01/28/15   Junius Creamer, NP  ZETIA 10 MG tablet Take 1 tablet by mouth daily. 12/18/14   Historical Provider, MD    Family History No family history on file.  Social History Social History  Substance Use Topics  . Smoking status: Current Every Day Smoker    Packs/day: 0.50    Types: Cigarettes  . Smokeless tobacco: Not on file  . Alcohol use No     Allergies   Review of patient's allergies indicates no known allergies.   Review of Systems Review of Systems  Constitutional: Negative for chills and fever.  Musculoskeletal: Positive for arthralgias. Negative for joint swelling.  Skin: Negative for wound.  All other systems reviewed and are negative.    Physical Exam Updated Vital Signs There were no vitals taken for this visit.  Physical Exam  Constitutional: She is oriented to person, place, and time. She appears well-developed and well-nourished.  Obese  HENT:  Head: Normocephalic and atraumatic.  Eyes: EOM are normal. Pupils are equal, round, and reactive to light.  Neck: Normal range of motion. Neck supple. No JVD present.  Cardiovascular: Normal rate, regular rhythm and normal heart sounds.   No murmur heard. Pulmonary/Chest: Effort normal and breath sounds normal. She has no wheezes. She has no rales. She exhibits no tenderness.  Abdominal: Soft. Bowel sounds are normal. She exhibits no distension and no mass. There is no tenderness.  Musculoskeletal: Normal range of motion. She exhibits no edema.  No effusion, warmth, or swelling of there right knee. No instability; full ROM.  No difference in calf circumference   Lymphadenopathy:    She has no cervical adenopathy.  Neurological: She is alert and oriented to person, place, and time. No  cranial nerve deficit. She exhibits normal muscle tone. Coordination normal.  Skin: Skin is warm and dry. No rash noted.  Psychiatric: She has a normal mood and affect. Her behavior is normal. Judgment and thought content normal.  Nursing note and vitals reviewed.    ED Treatments / Results  DIAGNOSTIC STUDIES: Oxygen Saturation is 95% on RA, adequate by my interpretation.    COORDINATION OF CARE: 1:50 AM- Pt advised of plan for treatment and pt agrees. Pt will receive Decadron PO, Dilaudid IM, and Zofran ODT.    Procedures Procedures (including critical care time)  Medications Ordered in ED Medications  dexamethasone (DECADRON) tablet 12 mg (not administered)  HYDROmorphone (DILAUDID) injection 2 mg (not administered)  ondansetron (ZOFRAN-ODT) disintegrating tablet 8 mg (not administered)     Initial Impression / Assessment and Plan / ED Course  Delora Fuel, MD has reviewed the triage vital signs and the nursing notes.  Pertinent labs & imaging results that were available during my care of the patient were reviewed by me and considered in my  medical decision making (see chart for details).  Clinical Course   Right knee pain. Exam shows no evidence of acute arthritis. No swelling to suggest DVT. Old records are reviewed and she has prior ED visits for arthritis. She has both rheumatoid arthritis and osteoarthritis. She probably would benefit from steroid injection, and she is referred back to her rheumatologist for this. No indication for imaging today, no indication for lab testing. She is given an injection of hydromorphone for pain and is given a single dose of dexamethasone.  Final Clinical Impressions(s) / ED Diagnoses   Final diagnoses:  Acute pain of right knee    New Prescriptions New Prescriptions   No medications on file   I personally performed the services described in this documentation, which was scribed in my presence. The recorded information has been  reviewed and is accurate.       Delora Fuel, MD [id number]

## 2016-08-13 NOTE — Telephone Encounter (Signed)
Patient states [facility] has not received rx for methotrexate. They refilled the Humira but have yet to receive MTX rx.

## 2016-08-16 DIAGNOSIS — G894 Chronic pain syndrome: Secondary | ICD-10-CM | POA: Diagnosis not present

## 2016-08-16 DIAGNOSIS — M79671 Pain in right foot: Secondary | ICD-10-CM | POA: Diagnosis not present

## 2016-08-16 DIAGNOSIS — M25561 Pain in right knee: Secondary | ICD-10-CM | POA: Diagnosis not present

## 2016-08-16 DIAGNOSIS — M542 Cervicalgia: Secondary | ICD-10-CM | POA: Diagnosis not present

## 2016-08-16 DIAGNOSIS — M25572 Pain in left ankle and joints of left foot: Secondary | ICD-10-CM | POA: Diagnosis not present

## 2016-08-16 DIAGNOSIS — Z79891 Long term (current) use of opiate analgesic: Secondary | ICD-10-CM | POA: Diagnosis not present

## 2016-08-20 ENCOUNTER — Ambulatory Visit (HOSPITAL_COMMUNITY)
Admission: EM | Admit: 2016-08-20 | Discharge: 2016-08-20 | Disposition: A | Discharge status: Home / Self Care | Payer: Medicare Other | Attending: Family Medicine | Admitting: Family Medicine

## 2016-08-20 ENCOUNTER — Encounter (HOSPITAL_COMMUNITY): Payer: Self-pay | Admitting: Emergency Medicine

## 2016-08-20 DIAGNOSIS — I889 Nonspecific lymphadenitis, unspecified: Secondary | ICD-10-CM

## 2016-08-20 DIAGNOSIS — R519 Headache, unspecified: Secondary | ICD-10-CM

## 2016-08-20 DIAGNOSIS — R51 Headache: Secondary | ICD-10-CM | POA: Diagnosis not present

## 2016-08-20 MED ORDER — PENICILLIN V POTASSIUM 500 MG PO TABS
500.0000 mg | ORAL_TABLET | Freq: Three times a day (TID) | ORAL | 0 refills | Status: DC
Start: 2016-08-20 — End: 2016-11-02

## 2016-08-20 MED ORDER — BUTALBITAL-APAP-CAFFEINE 50-325-40 MG PO TABS: 1.0000 | ORAL_TABLET | Freq: Four times a day (QID) | ORAL | 0 refills | Status: AC | PRN

## 2016-08-20 NOTE — Discharge Instructions (Signed)
Please return if your symptoms do not resolve over the next 48 hours. Follow your dentist.

## 2016-08-20 NOTE — ED Provider Notes (Signed)
[facility]    CSN: [id number] Arrival date & time: 08/20/16  1752     History   Chief Complaint Chief Complaint  Patient presents with  . Headache    HPI Shelby Robertson is a 45 y.o. female.   Shelby Robertson is a 45 y.o. female with a hx of hypertension, fibromyalgia, osteoarthritis, and bilateral leg edema who presents with headache and right sided neck swelling. She is currently undergoing dental evaluation and plans have a couple teeth pulled on that side of her mouth and 2 teeth on the other side repaired from a carious dental problem.  She's had a headache now for 3 days in the next swelling as well. It's somewhat tender to palpate the right side of her neck.  Patient's had no cough, earache, upset stomach.      Past Medical History:  Diagnosis Date  . Fibromyalgia   . Fibromyalgia   . Hypertension   . Osteoarthritis   . Rheumatoid arthritis(714.0)   . Rheumatoid arthritis(714.0)     Patient Active Problem List   Diagnosis Date Noted  . Rheumatoid arthritis of multiple sites without rheumatoid factor ([place]) 08/12/2016  . ARTHRITIS, GENERALIZED 07/14/2010  . UTI 10/27/2009  . FURUNCULOSIS 08/19/2009  . HYPERTENSION, BENIGN ESSENTIAL 03/04/2009  . POLYURIA 03/04/2009  . FIBROMYALGIA 11/09/2007  . HEADACHE 11/09/2007  . HIDRADENITIS SUPPURATIVA 07/11/2007  . DYSLIPIDEMIA 06/28/2007  . LEG EDEMA, BILATERAL 05/05/2007    History reviewed. No pertinent surgical history.  OB History    No data available       Home Medications    Prior to Admission medications   Medication Sig Start Date End Date Taking? Authorizing Provider  acetaminophen (TYLENOL) 325 MG tablet Take 650 mg by mouth every 6 (six) hours as needed for pain.   Yes Historical Provider, MD  Adalimumab (HUMIRA PEN) 40 MG/0.8ML PNKT Inject 1 pen into the skin every 14 (fourteen) days. 08/10/16  Yes Bo Merino, MD  amLODipine-olmesartan (AZOR) 10-40 MG tablet Take 1 tablet  by mouth daily. 11/25/15  Yes Historical Provider, MD  DULoxetine (CYMBALTA) 60 MG capsule Take 60 mg by mouth daily.     Yes Historical Provider, MD  folic acid (FOLVITE) 1 MG tablet Take 2 mg by mouth daily.   Yes Historical Provider, MD  furosemide (LASIX) 20 MG tablet Take 1 tablet by mouth daily. 01/20/15  Yes Historical Provider, MD  HYDROcodone-acetaminophen (NORCO) 10-325 MG per tablet Take 1 tablet by mouth every 6 (six) hours as needed for moderate pain.    Yes Historical Provider, MD  ibuprofen (ADVIL,MOTRIN) 200 MG tablet Take 400 mg by mouth every 6 (six) hours as needed for pain.   Yes Historical Provider, MD  meloxicam (MOBIC) 15 MG tablet Take 15 mg by mouth daily.     Yes Historical Provider, MD  methotrexate 50 MG/2ML injection Inject 0.8 mL sub Q once a week 08/13/16  Yes Bo Merino, MD  tiZANidine (ZANAFLEX) 4 MG tablet Take 4 mg by mouth every 6 (six) hours as needed (for muscle spasms).    Yes Historical Provider, MD  ZETIA 10 MG tablet Take 1 tablet by mouth daily. 12/18/14  Yes Historical Provider, MD  butalbital-acetaminophen-caffeine (FIORICET, ESGIC) 50-325-40 MG tablet Take 1-2 tablets by mouth every 6 (six) hours as needed for headache. 08/20/16 08/20/17  Robyn Haber, MD  penicillin v potassium (VEETID) 500 MG tablet Take 1 tablet (500 mg total) by mouth 3 (three) times daily. 08/20/16   Synetta Shadow  Ej Pinson, MD    Family History No family history on file.  Social History Social History  Substance Use Topics  . Smoking status: Current Every Day Smoker    Packs/day: 0.50    Types: Cigarettes  . Smokeless tobacco: Current User  . Alcohol use No     Allergies   Review of patient's allergies indicates no known allergies.   Review of Systems Review of Systems  Constitutional: Negative.   HENT: Positive for dental problem.   Eyes: Negative.   Respiratory: Negative.   Cardiovascular: Negative.   Gastrointestinal: Negative.   Musculoskeletal: Positive for  arthralgias, myalgias, neck pain and neck stiffness.  Neurological: Negative.      Physical Exam Triage Vital Signs ED Triage Vitals  Enc Vitals Group     BP 08/20/16 1804 110/68     Pulse Rate 08/20/16 1804 81     Resp 08/20/16 1804 18     Temp 08/20/16 1804 98.4 F (36.9 C)     Temp Source 08/20/16 1804 Oral     SpO2 08/20/16 1804 100 %     Weight --      Height --      Head Circumference --      Peak Flow --      Pain Score 08/20/16 1807 7     Pain Loc --      Pain Edu? --      Excl. in [place]? --    No data found.   Updated Vital Signs BP 110/68 (BP Location: Left Arm)   Pulse 81   Temp 98.4 F (36.9 C) (Oral)   Resp 18   LMP 07/07/2016   SpO2 100%     Physical Exam  Constitutional: She is oriented to person, place, and time. She appears well-developed and well-nourished.  HENT:  Head: Normocephalic.  Right Ear: External ear normal.  Left Ear: External ear normal.  Mild come swelling near tooth #28. Dental caries in teeth #9 and 10  Eyes: Conjunctivae and EOM are normal. Pupils are equal, round, and reactive to light.  Neck: Normal range of motion. Neck supple.  2 cm tender right submandibular node  Cardiovascular: Normal rate.   Pulmonary/Chest: Effort normal.  Musculoskeletal: Normal range of motion.  Lymphadenopathy:    She has cervical adenopathy.  Neurological: She is alert and oriented to person, place, and time.  Skin: Skin is warm and dry.  Nursing note and vitals reviewed.    UC Treatments / Results  Labs (all labs ordered are listed, but only abnormal results are displayed) Labs Reviewed - No data to display  EKG  EKG Interpretation None       Radiology No results found.  Procedures Procedures (including critical care time)  Medications Ordered in UC Medications - No data to display   Initial Impression / Assessment and Plan / UC Course  I have reviewed the triage vital signs and the nursing notes.  Pertinent labs & imaging  results that were available during my care of the patient were reviewed by me and considered in my medical decision making (see chart for details).  Clinical Course    Final Clinical Impressions(s) / UC Diagnoses   Final diagnoses:  Cervical adenitis  Intractable headache, unspecified chronicity pattern, unspecified headache type    New Prescriptions New Prescriptions   BUTALBITAL-ACETAMINOPHEN-CAFFEINE (FIORICET, ESGIC) 50-325-40 MG TABLET    Take 1-2 tablets by mouth every 6 (six) hours as needed for headache.   PENICILLIN V POTASSIUM (  VEETID) 500 MG TABLET    Take 1 tablet (500 mg total) by mouth 3 (three) times daily.     Robyn Haber, MD 08/20/16 [phone]

## 2016-08-20 NOTE — ED Triage Notes (Signed)
Here for constant HA onset 3 days associated w/right side neck swelling  Sx increase when she turns and w/loud noises  A&O x4... NAD

## 2016-08-21 DIAGNOSIS — Z79899 Other long term (current) drug therapy: Secondary | ICD-10-CM | POA: Insufficient documentation

## 2016-08-22 NOTE — Progress Notes (Signed)
*IMAGE* Office Visit Note  Patient: Shelby Robertson             Date of Birth: 16-Feb-1971           MRN: [id number]             PCP: Minette Brine Referring: Nanci Pina, FNP Visit Date: 08/24/2016 Occupation:@GUAROCC @    Subjective:  No chief complaint on file.   History of Present Illness: Nori Sheasley is a 45 y.o. female .  Last seen in our office on 03/22/2016 for rheumatoid arthritis. 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 On the last visit she was doing quite well. No joint pain swelling and stiffness. No synovitis on examination. On Humira every, methotrexate 0.8 and vitals every week, folic acid 2 pills every day.  She also has fibromyalgia which is doing well.  Her last labs were from September 2017 which showed CMP with GFR and CBC with differential normal. TB but was also negative as of September 2017.  Most recently on 08/13/2016, she had acute right knee pain. She saw the [facility] emergency room. Reviewing that encounter shows that she was having this right knee joint pain for about a week. No falls no traumas. They gave her a cortisone injection in the right knee which relieved the pain fully.  On 01/28/2015, she had seen Dr. Hector Shade in the emergency department. She was having left knee pain at that time. She she had x-rays done for views. Showed moderate medial compartment narrowing.  At the headache pain clinic, they gave her 3 separate injections in her right knee joint about 4 months ago. She states that they did not last too long. Approximately 3 months after the injection her right knee pain came back and she had to see the emergency room for the acute right knee joint pain as described above.  We discussed giving Visco supplementation to both knees 6 months after her last set of injections. Patient is agreeable. I told her we should do both knees but we will have to see what her insurance company  approves.  Overall her rheumatoid arthritis is being well addressed by her current treatment plan of Humira methotrexate. No change in treatment needed at this time. We will will refill all the medications as discussed. We will do labs today. And then every 3 months.   Activities of Daily Living:  Patient reports morning stiffness for 15 minutes.   Patient Denies nocturnal pain.  Difficulty dressing/grooming: Denies Difficulty climbing stairs: Denies Difficulty getting out of chair: Denies Difficulty using hands for taps, buttons, cutlery, and/or writing: Denies   Review of Systems  Constitutional: Positive for fatigue.  HENT: Negative for mouth sores and mouth dryness.   Eyes: Negative for dryness.  Respiratory: Negative for shortness of breath.   Gastrointestinal: Negative for constipation and diarrhea.  Musculoskeletal: Positive for myalgias and myalgias.  Skin: Negative for sensitivity to sunlight.  Psychiatric/Behavioral: Positive for sleep disturbance. Negative for decreased concentration.    PMFS History:  Patient Active Problem List   Diagnosis Date Noted  . High risk medication use 08/21/2016  . Rheumatoid arthritis of multiple sites without rheumatoid factor ([place]) 08/12/2016  . ARTHRITIS, GENERALIZED 07/14/2010  . UTI 10/27/2009  . FURUNCULOSIS 08/19/2009  . HYPERTENSION, BENIGN ESSENTIAL 03/04/2009  . POLYURIA 03/04/2009  . FIBROMYALGIA 11/09/2007  . HEADACHE 11/09/2007  . HIDRADENITIS SUPPURATIVA 07/11/2007  . DYSLIPIDEMIA 06/28/2007  . LEG EDEMA, BILATERAL 05/05/2007    Past  Medical History:  Diagnosis Date  . Fibromyalgia   . Fibromyalgia   . Hypertension   . Osteoarthritis   . Rheumatoid arthritis(714.0)   . Rheumatoid arthritis(714.0)     History reviewed. No pertinent family history. History reviewed. No pertinent surgical history. Social History   Social History Narrative  . No narrative on file     Objective: Vital Signs: BP 133/85 (BP  Location: Left Arm, Patient Position: Sitting, Cuff Size: Large)   Pulse 100   Resp 13   Ht 5\' 5"  (1.651 m)   Wt (!) 322 lb (146.1 kg)   LMP 07/07/2016   BMI 53.58 kg/m    Physical Exam  Constitutional: She is oriented to person, place, and time. She appears well-developed and well-nourished.  HENT:  Head: Normocephalic and atraumatic.  Eyes: EOM are normal. Pupils are equal, round, and reactive to light.  Cardiovascular: Normal rate, regular rhythm and normal heart sounds.  Exam reveals no gallop and no friction rub.   No murmur heard. Pulmonary/Chest: Effort normal and breath sounds normal. She has no wheezes. She has no rales.  Abdominal: Soft. Bowel sounds are normal. She exhibits no distension. There is no tenderness. There is no guarding. No hernia.  Musculoskeletal: Normal range of motion. She exhibits no edema, tenderness or deformity.  Lymphadenopathy:    She has no cervical adenopathy.  Neurological: She is alert and oriented to person, place, and time. Coordination normal.  Skin: Skin is warm and dry. Capillary refill takes less than 2 seconds. No rash noted.  Psychiatric: She has a normal mood and affect. Her behavior is normal.  Nursing note and vitals reviewed.    Musculoskeletal Exam:  Full range of motion of all joints Grip strength is equal and strong bilaterally Fibromyalgia tender points are all absent but note the patient does have fibromyalgia. It is just not very active at this time and no tender points. This was similar to her June 2017 visit.  CDAI Exam: No CDAI exam completed.    Investigation: Findings:  Labs from 07/08/2016 for Maryellen Pile shows CMP with GFR normal except nonfasting glucose at 104. Labs from 07/08/2016 shows CBC with differential normal except platelet count slightly elevated at 464,000. Patient's TB gold is negative as of 03/22/2016.    Imaging: No results found.  Speciality Comments: No specialty comments  available.    Procedures:  No procedures performed Allergies: Patient has no known allergies.   Assessment / Plan: Visit Diagnoses: Rheumatoid arthritis of multiple sites without rheumatoid factor (HCC) - SEVERE, EROSIVE DISEASE.SERO NEGATIVE;  High risk medication use - HUMIRA  EVERY 2 WEEKS;MTX 123XX123 EVERY WEEK;FOLIC ACID 2MG  QD;TB GOLD NEGATIVE JUNE 2017 @ [facility] - Plan: CBC with Differential/Platelet, COMPLETE METABOLIC PANEL WITH GFR, CBC with Differential/Platelet, COMPLETE METABOLIC PANEL WITH GFR  Fibromyalgia  Fatigue, unspecified type  Insomnia, unspecified type    Consider doing Visco supplementation for patient's bilateral knees. About 3-4 months ago she had physical in her right knee according to the patient at the [facility].  She will call us and let us know what medicine she received in her right knee. She states that only lasted about 3-4 months and now she her pain is returning. She rates her discomfort as 5-6 on a scale of 0-10. By the end of October it flared and she had to go to the emergency room and she received a cortisone injection in the right knee. This resolved her pain fully.  Orders: Orders Placed  This Encounter  Procedures  . CBC with Differential/Platelet  . COMPLETE METABOLIC PANEL WITH GFR  . CBC with Differential/Platelet  . COMPLETE METABOLIC PANEL WITH GFR   Meds ordered this encounter  Medications  . methotrexate 50 MG/2ML injection    Sig: Inject 0.8 mL sub Q once a week    Dispense:  12 mL    Refill:  0    Order Specific Question:   Supervising Provider    Answer:   Bo Merino [[id number]]  . folic acid (FOLVITE) 1 MG tablet    Sig: Take 2 tablets (2 mg total) by mouth daily.    Dispense:  180 tablet    Refill:  4    Order Specific Question:   Supervising Provider    Answer:   Bo Merino [[id number]]  . Adalimumab (HUMIRA PEN) 40 MG/0.8ML PNKT    Sig: Inject 1 pen into the skin every 14 (fourteen) days.    Dispense:  6  each    Refill:  0    Order Specific Question:   Supervising Provider    Answer:   Bo Merino [[id number]]  . metoprolol tartrate (LOPRESSOR) 25 MG tablet    Face-to-face time spent with patient was 40 minutes. 50% of time was spent in counseling and coordination of care.  Follow-Up Instructions: Return in about 5 months (around 01/22/2017).   I examined and evaluated the patient with Eliezer Lofts PA. The plan of care was discussed as noted above.  Bo Merino, MD

## 2016-08-24 ENCOUNTER — Ambulatory Visit (INDEPENDENT_AMBULATORY_CARE_PROVIDER_SITE_OTHER): Payer: Medicare Other | Admitting: Rheumatology

## 2016-08-24 ENCOUNTER — Encounter: Payer: Self-pay | Admitting: Rheumatology

## 2016-08-24 VITALS — BP 133/85 | HR 100 | Resp 13 | Ht 65.0 in | Wt 322.0 lb

## 2016-08-24 DIAGNOSIS — M0609 Rheumatoid arthritis without rheumatoid factor, multiple sites: Secondary | ICD-10-CM | POA: Diagnosis not present

## 2016-08-24 DIAGNOSIS — R5383 Other fatigue: Secondary | ICD-10-CM | POA: Diagnosis not present

## 2016-08-24 DIAGNOSIS — M797 Fibromyalgia: Secondary | ICD-10-CM | POA: Diagnosis not present

## 2016-08-24 DIAGNOSIS — Z79899 Other long term (current) drug therapy: Secondary | ICD-10-CM

## 2016-08-24 DIAGNOSIS — G4700 Insomnia, unspecified: Secondary | ICD-10-CM

## 2016-08-24 MED ORDER — ADALIMUMAB 40 MG/0.8ML ~~LOC~~ AJKT: 1.0000 "pen " | AUTO-INJECTOR | SUBCUTANEOUS | 0 refills | Status: DC

## 2016-08-24 MED ORDER — METHOTREXATE SODIUM CHEMO INJECTION 50 MG/2ML: INTRAMUSCULAR | 0 refills | Status: DC

## 2016-08-24 MED ORDER — FOLIC ACID 1 MG PO TABS: 2.0000 mg | ORAL_TABLET | Freq: Every day | ORAL | 4 refills | Status: DC

## 2016-08-25 ENCOUNTER — Telehealth: Payer: Self-pay | Admitting: Radiology

## 2016-08-25 LAB — CBC WITH DIFFERENTIAL/PLATELET
BASOS ABS: 0 {cells}/uL (ref 0–200)
Basophils Relative: 0 %
EOS PCT: 2 %
Eosinophils Absolute: 176 cells/uL (ref 15–500)
HCT: 39.5 % (ref 35.0–45.0)
Hemoglobin: 13.2 g/dL (ref 11.7–15.5)
Lymphocytes Relative: 34 %
Lymphs Abs: 2992 cells/uL (ref 850–3900)
MCH: 29 pg (ref 27.0–33.0)
MCHC: 33.4 g/dL (ref 32.0–36.0)
MCV: 86.8 fL (ref 80.0–100.0)
MPV: 9.3 fL (ref 7.5–12.5)
Monocytes Absolute: 440 cells/uL (ref 200–950)
Monocytes Relative: 5 %
NEUTROS PCT: 59 %
Neutro Abs: 5192 cells/uL (ref 1500–7800)
PLATELETS: 414 10*3/uL — AB (ref 140–400)
RBC: 4.55 MIL/uL (ref 3.80–5.10)
RDW: 15.8 % — ABNORMAL HIGH (ref 11.0–15.0)
WBC: 8.8 10*3/uL (ref 3.8–10.8)

## 2016-08-25 LAB — COMPLETE METABOLIC PANEL WITH GFR
ALBUMIN: 4 g/dL (ref 3.6–5.1)
ALK PHOS: 65 U/L (ref 33–115)
ALT: 14 U/L (ref 6–29)
AST: 12 U/L (ref 10–30)
BUN: 5 mg/dL — ABNORMAL LOW (ref 7–25)
CO2: 23 mmol/L (ref 20–31)
Calcium: 9.3 mg/dL (ref 8.6–10.2)
Chloride: 107 mmol/L (ref 98–110)
Creat: 0.63 mg/dL (ref 0.50–1.10)
GFR, Est African American: >89 mL/min (ref >=60)
Glucose, Bld: 119 mg/dL — ABNORMAL HIGH (ref 65–99)
POTASSIUM: 4 mmol/L (ref 3.5–5.3)
SODIUM: 139 mmol/L (ref 135–146)
Total Bilirubin: 0.3 mg/dL (ref 0.2–1.2)
Total Protein: 7.5 g/dL (ref 6.1–8.1)

## 2016-08-25 NOTE — Telephone Encounter (Signed)
-----   Message from Eliezer Lofts, [place] sent at 08/25/2016 11:14 AM EST ----- Labs are within normal limits.No change in treatment.

## 2016-08-25 NOTE — Progress Notes (Signed)
Labs are within normal limits.No change in treatment.

## 2016-08-25 NOTE — Telephone Encounter (Signed)
I have called patient to advise labs are normal  

## 2016-08-31 ENCOUNTER — Other Ambulatory Visit: Payer: Self-pay | Admitting: Nurse Practitioner

## 2016-08-31 DIAGNOSIS — Z1231 Encounter for screening mammogram for malignant neoplasm of breast: Secondary | ICD-10-CM

## 2016-09-05 ENCOUNTER — Emergency Department (HOSPITAL_COMMUNITY): Payer: Medicare Other

## 2016-09-05 ENCOUNTER — Encounter (HOSPITAL_COMMUNITY): Payer: Self-pay

## 2016-09-05 ENCOUNTER — Emergency Department (HOSPITAL_COMMUNITY)
Admission: EM | Admit: 2016-09-05 | Discharge: 2016-09-05 | Disposition: A | Discharge status: Home / Self Care | Payer: Medicare Other | Attending: Emergency Medicine | Admitting: Emergency Medicine

## 2016-09-05 DIAGNOSIS — R51 Headache: Secondary | ICD-10-CM | POA: Diagnosis not present

## 2016-09-05 DIAGNOSIS — Z79899 Other long term (current) drug therapy: Secondary | ICD-10-CM | POA: Diagnosis not present

## 2016-09-05 DIAGNOSIS — I10 Essential (primary) hypertension: Secondary | ICD-10-CM | POA: Diagnosis not present

## 2016-09-05 DIAGNOSIS — F17210 Nicotine dependence, cigarettes, uncomplicated: Secondary | ICD-10-CM | POA: Insufficient documentation

## 2016-09-05 DIAGNOSIS — R519 Headache, unspecified: Secondary | ICD-10-CM

## 2016-09-05 MED ORDER — PROMETHAZINE HCL 25 MG/ML IJ SOLN
25.0000 mg | Freq: Once | INTRAMUSCULAR | Status: AC
  Administered 2016-09-05: 25 mg via INTRAMUSCULAR
  Filled 2016-09-05: qty 1

## 2016-09-05 MED ORDER — KETOROLAC TROMETHAMINE 60 MG/2ML IM SOLN
60.0000 mg | Freq: Once | INTRAMUSCULAR | Status: AC
  Administered 2016-09-05: 60 mg via INTRAMUSCULAR
  Filled 2016-09-05: qty 2

## 2016-09-05 NOTE — Discharge Instructions (Signed)
Continue your medications as previously prescribed as needed for pain.  Return to the emergency department if your symptoms significantly worsen or change.

## 2016-09-05 NOTE — ED Provider Notes (Signed)
[place] DEPT Provider Note   CSN: [id number] Arrival date & time: 09/05/16  1758     History   Chief Complaint Chief Complaint  Patient presents with  . Headache    HPI Shelby Robertson is a 45 y.o. female.  Patient is a 45 year old female with past medical history of fibromyalgia, rheumatoid and osteoarthritis. She presents for evaluation of headache. This began in the absence of any injury or trauma and has been ongoing for the past 3 weeks. She was seen in urgent care last week and told that she possibly had an infected tooth. She was prescribed penicillin and Fioricet which have not helped. She denies any visual disturbances. She denies any weakness or numbness. She does report some neck pain with no radiation.   The history is provided by the patient.  Headache   This is a new problem. Episode onset: 3 weeks ago. The problem occurs constantly. The problem has been gradually worsening. The headache is associated with nothing. The pain is located in the frontal region. The quality of the pain is described as dull. The pain is moderate. The pain does not radiate. Pertinent negatives include no fever, no nausea and no vomiting. She has tried nothing for the symptoms.    Past Medical History:  Diagnosis Date  . Fibromyalgia   . Fibromyalgia   . Hypertension   . Osteoarthritis   . Rheumatoid arthritis(714.0)   . Rheumatoid arthritis(714.0)     Patient Active Problem List   Diagnosis Date Noted  . High risk medication use 08/21/2016  . Rheumatoid arthritis of multiple sites without rheumatoid factor ([place]) 08/12/2016  . ARTHRITIS, GENERALIZED 07/14/2010  . UTI 10/27/2009  . FURUNCULOSIS 08/19/2009  . HYPERTENSION, BENIGN ESSENTIAL 03/04/2009  . POLYURIA 03/04/2009  . FIBROMYALGIA 11/09/2007  . HEADACHE 11/09/2007  . HIDRADENITIS SUPPURATIVA 07/11/2007  . DYSLIPIDEMIA 06/28/2007  . LEG EDEMA, BILATERAL 05/05/2007    History reviewed. No pertinent surgical  history.  OB History    No data available       Home Medications    Prior to Admission medications   Medication Sig Start Date End Date Taking? Authorizing Provider  acetaminophen (TYLENOL) 325 MG tablet Take 650 mg by mouth every 6 (six) hours as needed for pain.    Historical Provider, MD  Adalimumab (HUMIRA PEN) 40 MG/0.8ML PNKT Inject 1 pen into the skin every 14 (fourteen) days. 08/24/16 11/22/16  Naitik Panwala, PA-C  amLODipine-olmesartan (AZOR) 10-40 MG tablet Take 1 tablet by mouth daily. 11/25/15   Historical Provider, MD  butalbital-acetaminophen-caffeine (FIORICET, ESGIC) 50-325-40 MG tablet Take 1-2 tablets by mouth every 6 (six) hours as needed for headache. 08/20/16 08/20/17  Robyn Haber, MD  DULoxetine (CYMBALTA) 60 MG capsule Take 60 mg by mouth daily.      Historical Provider, MD  folic acid (FOLVITE) 1 MG tablet Take 2 tablets (2 mg total) by mouth daily. 08/24/16 11/17/17  Naitik Panwala, PA-C  furosemide (LASIX) 20 MG tablet Take 1 tablet by mouth daily. 01/20/15   Historical Provider, MD  HYDROcodone-acetaminophen (NORCO) 10-325 MG per tablet Take 1 tablet by mouth every 6 (six) hours as needed for moderate pain.     Historical Provider, MD  ibuprofen (ADVIL,MOTRIN) 200 MG tablet Take 400 mg by mouth every 6 (six) hours as needed for pain.    Historical Provider, MD  meloxicam (MOBIC) 15 MG tablet Take 15 mg by mouth daily.      Historical Provider, MD  methotrexate 50 MG/2ML injection  Inject 0.8 mL sub Q once a week 08/24/16   Naitik Panwala, PA-C  metoprolol tartrate (LOPRESSOR) 25 MG tablet  08/09/16   Historical Provider, MD  penicillin v potassium (VEETID) 500 MG tablet Take 1 tablet (500 mg total) by mouth 3 (three) times daily. 08/20/16   Robyn Haber, MD  tiZANidine (ZANAFLEX) 4 MG tablet Take 4 mg by mouth every 6 (six) hours as needed (for muscle spasms).     Historical Provider, MD  ZETIA 10 MG tablet Take 1 tablet by mouth daily. 12/18/14   Historical Provider, MD      Family History History reviewed. No pertinent family history.  Social History Social History  Substance Use Topics  . Smoking status: Current Every Day Smoker    Packs/day: 0.50    Types: Cigarettes  . Smokeless tobacco: Never Used  . Alcohol use No     Allergies   Patient has no known allergies.   Review of Systems Review of Systems  Constitutional: Negative for fever.  Gastrointestinal: Negative for nausea and vomiting.  Neurological: Positive for headaches.  All other systems reviewed and are negative.    Physical Exam Updated Vital Signs BP 125/85 (BP Location: Right Arm)   Pulse 97   Temp 97.5 F (36.4 C) (Oral)   Resp 14   Ht 5\' 5"  (1.651 m)   Wt (!) 320 lb 6 oz (145.3 kg)   LMP 08/08/2016   SpO2 100%   BMI 53.31 kg/m   Physical Exam  Constitutional: She is oriented to person, place, and time. She appears well-developed and well-nourished. No distress.  HENT:  Head: Normocephalic and atraumatic.  Mouth/Throat: Oropharynx is clear and moist.  Eyes: EOM are normal. Pupils are equal, round, and reactive to light.  Neck: Normal range of motion. Neck supple.  Cardiovascular: Normal rate and regular rhythm.  Exam reveals no gallop and no friction rub.   No murmur heard. Pulmonary/Chest: Effort normal and breath sounds normal. No respiratory distress. She has no wheezes.  Abdominal: Soft. Bowel sounds are normal. She exhibits no distension. There is no tenderness.  Musculoskeletal: Normal range of motion.  Neurological: She is alert and oriented to person, place, and time. No cranial nerve deficit. She exhibits normal muscle tone. Coordination normal.  Skin: Skin is warm and dry. She is not diaphoretic.  Nursing note and vitals reviewed.    ED Treatments / Results  Labs (all labs ordered are listed, but only abnormal results are displayed) Labs Reviewed - No data to display  EKG  EKG Interpretation None       Radiology No results  found.  Procedures Procedures (including critical care time)  Medications Ordered in ED Medications  ketorolac (TORADOL) injection 60 mg (not administered)  promethazine (PHENERGAN) injection 25 mg (not administered)     Initial Impression / Assessment and Plan / ED Course  I have reviewed the triage vital signs and the nursing notes.  Pertinent labs & imaging results that were available during my care of the patient were reviewed by me and considered in my medical decision making (see chart for details).  Clinical Course     Patient with the above past medical history. She presents for evaluation of headache. She is neurologically intact and CT scan of the head is unremarkable. I am uncertain as to what is causing her discomfort, however nothing today appears emergent. She will be discharged with instructions to continue taking her medications as before and return to the ER if  symptoms significantly worsen or change.  Final Clinical Impressions(s) / ED Diagnoses   Final diagnoses:  None    New Prescriptions New Prescriptions   No medications on file     Veryl Speak, MD 09/05/16 2006

## 2016-09-05 NOTE — ED Triage Notes (Signed)
Onset 3 weeks headache posterior base of head and on right side of forehead.  Difficulty turning head side to side d/t pain. Pt feels sides of neck is swollen.  Was seen 08-20-16 and prescribed Fioricet and PCN V as it was thought to be d/t teeth problems.  Pain meds not relieving headache.

## 2016-09-15 DIAGNOSIS — M545 Low back pain: Secondary | ICD-10-CM | POA: Diagnosis not present

## 2016-09-15 DIAGNOSIS — M79604 Pain in right leg: Secondary | ICD-10-CM | POA: Diagnosis not present

## 2016-09-15 DIAGNOSIS — G894 Chronic pain syndrome: Secondary | ICD-10-CM | POA: Diagnosis not present

## 2016-09-15 DIAGNOSIS — Z79891 Long term (current) use of opiate analgesic: Secondary | ICD-10-CM | POA: Diagnosis not present

## 2016-09-15 DIAGNOSIS — M542 Cervicalgia: Secondary | ICD-10-CM | POA: Diagnosis not present

## 2016-09-16 DIAGNOSIS — R609 Edema, unspecified: Secondary | ICD-10-CM | POA: Diagnosis not present

## 2016-09-16 DIAGNOSIS — R000 Tachycardia, unspecified: Secondary | ICD-10-CM | POA: Diagnosis not present

## 2016-09-16 DIAGNOSIS — I10 Essential (primary) hypertension: Secondary | ICD-10-CM | POA: Diagnosis not present

## 2016-09-21 ENCOUNTER — Ambulatory Visit: Payer: Medicare Other

## 2016-09-27 ENCOUNTER — Ambulatory Visit
Admission: RE | Admit: 2016-09-27 | Discharge: 2016-09-27 | Disposition: A | Discharge status: Home / Self Care | Payer: Medicare Other | Source: Ambulatory Visit | Attending: Nurse Practitioner | Admitting: Nurse Practitioner

## 2016-09-27 DIAGNOSIS — Z1231 Encounter for screening mammogram for malignant neoplasm of breast: Secondary | ICD-10-CM | POA: Diagnosis not present

## 2016-09-29 ENCOUNTER — Other Ambulatory Visit: Payer: Self-pay | Admitting: Nurse Practitioner

## 2016-09-29 DIAGNOSIS — R928 Other abnormal and inconclusive findings on diagnostic imaging of breast: Secondary | ICD-10-CM

## 2016-10-03 ENCOUNTER — Ambulatory Visit (HOSPITAL_BASED_OUTPATIENT_CLINIC_OR_DEPARTMENT_OTHER): Payer: Medicare Other | Attending: Anesthesiology | Admitting: Internal Medicine

## 2016-10-03 DIAGNOSIS — R5383 Other fatigue: Secondary | ICD-10-CM | POA: Insufficient documentation

## 2016-10-03 DIAGNOSIS — R0683 Snoring: Secondary | ICD-10-CM

## 2016-10-03 DIAGNOSIS — G4733 Obstructive sleep apnea (adult) (pediatric): Secondary | ICD-10-CM | POA: Insufficient documentation

## 2016-10-03 DIAGNOSIS — G4710 Hypersomnia, unspecified: Secondary | ICD-10-CM

## 2016-10-06 ENCOUNTER — Other Ambulatory Visit: Payer: Medicare Other

## 2016-10-06 DIAGNOSIS — R0683 Snoring: Secondary | ICD-10-CM | POA: Diagnosis not present

## 2016-10-06 NOTE — Procedures (Signed)
  Patient Name: Shelby Robertson, Shelby Robertson Date: 10/03/2016 Gender: Female D.O.B: 1970/12/23 Age (years): 45 Referring Provider: Joyce Copa Height (inches): 65 Interpreting Physician: Baird Lyons MD, ABSM Weight (lbs): 290 RPSGT: Earney Hamburg BMI: 48 MRN: [id number] Neck Size: 16.00 CLINICAL INFORMATION Sleep Study Type: NPSG  Indication for sleep study: Excessive Daytime Sleepiness, Fatigue, Snoring  Epworth Sleepiness Score: 3  SLEEP STUDY TECHNIQUE As per the AASM Manual for the Scoring of Sleep and Associated Events v2.3 (April 2016) with a hypopnea requiring 4% desaturations.  The channels recorded and monitored were frontal, central and occipital EEG, electrooculogram (EOG), submentalis EMG (chin), nasal and oral airflow, thoracic and abdominal wall motion, anterior tibialis EMG, snore microphone, electrocardiogram, and pulse oximetry.  MEDICATIONS Medications self-administered by patient taken the night of the study : TYLENOL, ZANAFLEX, [place] The study was initiated at 10:00:27 PM and ended at 4:26:31 AM.  Sleep onset time was 26.3 minutes and the sleep efficiency was 87.0%. The total sleep time was 335.8 minutes.  Stage REM latency was N/A minutes.  The patient spent 3.42% of the night in stage N1 sleep, 96.58% in stage N2 sleep, 0.00% in stage N3 and 0.00% in REM.  Alpha intrusion was absent.  Supine sleep was 10.36%.  RESPIRATORY PARAMETERS The overall apnea/hypopnea index (AHI) was 22.2 per hour. There were 1 total apneas, including 1 obstructive, 0 central and 0 mixed apneas. There were 123 hypopneas and 48 RERAs.  The AHI during Stage REM sleep was N/A per hour.  AHI while supine was 25.9 per hour.  The mean oxygen saturation was 91.71%. The minimum SpO2 during sleep was 0.00%.  Loud snoring was noted during this study.  CARDIAC DATA The 2 lead EKG demonstrated sinus rhythm. The mean heart rate was 78.25 beats per  minute. Other EKG findings include: None.  LEG MOVEMENT DATA The total PLMS were 0 with a resulting PLMS index of 0.00. Associated arousal with leg movement index was 0.0 .  IMPRESSIONS - Moderate obstructive sleep apnea occurred during this study (AHI = 22.2/h). - Insufficient early events to meet protocol requirements for split CPAP titration. - No significant central sleep apnea occurred during this study (CAI = 0.0/h). - Oxygen desaturation was noted during this study (Min O2 = 85.00%). - The patient snored with Loud snoring volume. - No cardiac abnormalities were noted during this study. - Clinically significant periodic limb movements did not occur during sleep. No significant associated arousals.  DIAGNOSIS - Obstructive Sleep Apnea (327.23 [G47.33 ICD-10])  RECOMMENDATIONS - Therapeutic CPAP titration to determine optimal pressure required to alleviate sleep disordered breathing. - Avoid alcohol, sedatives and other CNS depressants that may worsen sleep apnea and disrupt normal sleep architecture. - Sleep hygiene should be reviewed to assess factors that may improve sleep quality. - Weight management and regular exercise should be initiated or continued if appropriate.  [Electronically signed] 10/06/2016 02:18 PM  Baird Lyons MD, [place], American Board of Sleep Medicine   NPI: [id number]  [place], American Board of Sleep Medicine  ELECTRONICALLY SIGNED ON:  10/06/2016, 2:15 PM [place] PH: [phone]   FX: [phone] [place]

## 2016-10-13 ENCOUNTER — Telehealth: Payer: Self-pay | Admitting: Cardiovascular Disease

## 2016-10-13 NOTE — Telephone Encounter (Signed)
Received records from [facility] for appointment on 11/17/16 with Dr Oval Linsey.  Records given to [facility] (medical records) for Dr Blenda Mounts schedule on 11/17/16. lp

## 2016-10-15 ENCOUNTER — Ambulatory Visit: Admission: RE | Admit: 2016-10-15 | Payer: Medicare Other | Source: Ambulatory Visit

## 2016-10-21 DIAGNOSIS — M542 Cervicalgia: Secondary | ICD-10-CM | POA: Diagnosis not present

## 2016-10-21 DIAGNOSIS — M545 Low back pain: Secondary | ICD-10-CM | POA: Diagnosis not present

## 2016-10-21 DIAGNOSIS — G894 Chronic pain syndrome: Secondary | ICD-10-CM | POA: Diagnosis not present

## 2016-10-21 DIAGNOSIS — Z79891 Long term (current) use of opiate analgesic: Secondary | ICD-10-CM | POA: Diagnosis not present

## 2016-10-21 DIAGNOSIS — M79604 Pain in right leg: Secondary | ICD-10-CM | POA: Diagnosis not present

## 2016-10-26 ENCOUNTER — Telehealth: Payer: Self-pay | Admitting: Rheumatology

## 2016-10-26 MED ORDER — ADALIMUMAB 40 MG/0.8ML ~~LOC~~ AJKT: 1.0000 "pen " | AUTO-INJECTOR | SUBCUTANEOUS | 0 refills | Status: DC

## 2016-10-26 MED ORDER — METHOTREXATE SODIUM CHEMO INJECTION 50 MG/2ML: INTRAMUSCULAR | 0 refills | Status: DC

## 2016-10-26 MED ORDER — "TUBERCULIN-ALLERGY SYRINGES 27G X 1/2"" 1 ML MISC": 1.0000 | 3 refills | Status: DC

## 2016-10-26 MED ORDER — FOLIC ACID 1 MG PO TABS: 2.0000 mg | ORAL_TABLET | Freq: Every day | ORAL | 4 refills | Status: DC

## 2016-10-26 NOTE — Telephone Encounter (Signed)
Called patient and advised refills approved and prescriptions have been sent.  Reminded patient that she will be due for standing labs in February.  She voiced understanding.  I provided her with phone number for [facility].  Patient denied any further questions at this time.     Elisabeth Most, Pharm.D., BCPS Clinical Pharmacist Pager: [phone] Phone: [phone] 10/26/2016 4:44 PM

## 2016-10-26 NOTE — Telephone Encounter (Signed)
Patient has questions about MTX and Humira. Please call patient.

## 2016-10-26 NOTE — Telephone Encounter (Signed)
ok 

## 2016-10-26 NOTE — Telephone Encounter (Signed)
Patient states she was in the office in November 2017. Patient states she was advised that there was a way to get her Humira and her MTX filled where she could pick it up here in the office. Patient is out of her Humira and is due for it. Can you please advise?

## 2016-10-26 NOTE — Telephone Encounter (Signed)
I called patient to discuss.  Advised her she would not be able to pick up prescriptions at our office, but discussed use of a local pharmacy where she would be able to pick up prescriptions or get them mailed.  Patient was agreeable for use of [facility].  She reports she needs a refill of Humira, methotrexate, folic acid, and syringes.    Last visit: 08/24/16 Next visit: 01/24/17 Labs: 08/26/16 CBC normal except PLT 414, CMP normal except glucose 119 - will be due for standing labs again in February 2018 03/23/16 TB Gold negative   Okay to refill patient's medications - Humira, methotrexate, folic acid, and syringes?

## 2016-10-27 ENCOUNTER — Encounter (HOSPITAL_COMMUNITY): Payer: Self-pay

## 2016-10-27 DIAGNOSIS — Z79899 Other long term (current) drug therapy: Secondary | ICD-10-CM | POA: Diagnosis not present

## 2016-10-27 DIAGNOSIS — F17210 Nicotine dependence, cigarettes, uncomplicated: Secondary | ICD-10-CM | POA: Insufficient documentation

## 2016-10-27 DIAGNOSIS — I10 Essential (primary) hypertension: Secondary | ICD-10-CM | POA: Diagnosis not present

## 2016-10-27 DIAGNOSIS — M25561 Pain in right knee: Secondary | ICD-10-CM | POA: Insufficient documentation

## 2016-10-27 NOTE — ED Triage Notes (Signed)
Pt states that for the past 3-4 days R knee has been swollen, no injury. Pt has hx of arthritis in knee, has taken home medications without relief.

## 2016-10-28 ENCOUNTER — Emergency Department (HOSPITAL_COMMUNITY)
Admission: EM | Admit: 2016-10-28 | Discharge: 2016-10-28 | Disposition: A | Discharge status: Home / Self Care | Payer: Medicare Other | Attending: Emergency Medicine | Admitting: Emergency Medicine

## 2016-10-28 ENCOUNTER — Telehealth (INDEPENDENT_AMBULATORY_CARE_PROVIDER_SITE_OTHER): Payer: Self-pay | Admitting: Rheumatology

## 2016-10-28 ENCOUNTER — Other Ambulatory Visit: Payer: Self-pay | Admitting: *Deleted

## 2016-10-28 ENCOUNTER — Encounter (HOSPITAL_COMMUNITY): Payer: Self-pay | Admitting: Emergency Medicine

## 2016-10-28 DIAGNOSIS — M25561 Pain in right knee: Secondary | ICD-10-CM

## 2016-10-28 MED ORDER — "TUBERCULIN-ALLERGY SYRINGES 27G X 1/2"" 1 ML MISC": 1.0000 | 3 refills | Status: DC

## 2016-10-28 MED ORDER — ADALIMUMAB 40 MG/0.8ML ~~LOC~~ AJKT: 1.0000 "pen " | AUTO-INJECTOR | SUBCUTANEOUS | 0 refills | Status: DC

## 2016-10-28 MED ORDER — METHOTREXATE SODIUM CHEMO INJECTION 50 MG/2ML: INTRAMUSCULAR | 0 refills | Status: DC

## 2016-10-28 MED ORDER — FOLIC ACID 1 MG PO TABS: 2.0000 mg | ORAL_TABLET | Freq: Every day | ORAL | 4 refills | Status: DC

## 2016-10-28 MED ORDER — KETOROLAC TROMETHAMINE 60 MG/2ML IM SOLN
60.0000 mg | Freq: Once | INTRAMUSCULAR | Status: AC
  Administered 2016-10-28: 60 mg via INTRAMUSCULAR
  Filled 2016-10-28: qty 2

## 2016-10-28 MED FILL — HUMIRA PEN 40 MG/0.8ML PNKT: 40 | 84 days supply | Qty: 6 | Fill #0

## 2016-10-28 MED FILL — FOLIC ACID 1 MG TABLET: 1 | 90 days supply | Qty: 180 | Fill #0

## 2016-10-28 NOTE — ED Notes (Signed)
Pt ambulatory to room TRA C from waiting room; steady gait noted; pt uses cane for walking

## 2016-10-28 NOTE — Progress Notes (Signed)
Prescriptions were sent to the wrong pharmacy. Prescriptions reordered to [facility].

## 2016-10-28 NOTE — ED Provider Notes (Signed)
[facility] DEPT Provider Note   CSN: [id number] Arrival date & time: 10/27/16  2149     History   Chief Complaint Chief Complaint  Patient presents with  . Knee Pain    HPI Shelby Robertson is a 46 y.o. female.  HPI   46 year old female with history of fibromyalgia, rheumatoid arthritis, hypertension presenting today complaining of pain in the right knee. Patient reports gradual onset of persistent sharp achy pain to her right knee ongoing for the past week. Pain feels similar to prior osteoarthritis knee flare that she had in the past. Pain is not adequately controlled despite using her home medication including meloxicam, tizanidine, methotrexate, and Humira. No associated fever, numbness, or rash. Ambulation and bending anemia ex the pain worse. States pain is moderate in intensity currently. She also mentioned having prior DVT study recently that was normal. She felt that this pain is related to her usual flare up of her knee pain. Normally patient would follow-up with her PCP or rheumatologist, and states that she will receive an injection in her joints that helps with her pain. She was unable to follow-up with her doctor today and decided to come here instead.  Past Medical History:  Diagnosis Date  . Fibromyalgia   . Fibromyalgia   . Hypertension   . Osteoarthritis   . Rheumatoid arthritis(714.0)   . Rheumatoid arthritis(714.0)     Patient Active Problem List   Diagnosis Date Noted  . High risk medication use 08/21/2016  . Rheumatoid arthritis of multiple sites without rheumatoid factor ([place]) 08/12/2016  . ARTHRITIS, GENERALIZED 07/14/2010  . UTI 10/27/2009  . FURUNCULOSIS 08/19/2009  . HYPERTENSION, BENIGN ESSENTIAL 03/04/2009  . POLYURIA 03/04/2009  . FIBROMYALGIA 11/09/2007  . HEADACHE 11/09/2007  . HIDRADENITIS SUPPURATIVA 07/11/2007  . DYSLIPIDEMIA 06/28/2007  . LEG EDEMA, BILATERAL 05/05/2007    History reviewed. No pertinent surgical history.  OB  History    No data available       Home Medications    Prior to Admission medications   Medication Sig Start Date End Date Taking? Authorizing Provider  acetaminophen (TYLENOL) 325 MG tablet Take 650 mg by mouth every 6 (six) hours as needed for pain.    Historical Provider, MD  Adalimumab (HUMIRA PEN) 40 MG/0.8ML PNKT Inject 1 pen into the skin every 14 (fourteen) days. 10/26/16 01/24/17  Bo Merino, MD  amLODipine-olmesartan (AZOR) 10-40 MG tablet Take 1 tablet by mouth daily. 11/25/15   Historical Provider, MD  butalbital-acetaminophen-caffeine (FIORICET, ESGIC) 50-325-40 MG tablet Take 1-2 tablets by mouth every 6 (six) hours as needed for headache. 08/20/16 08/20/17  Robyn Haber, MD  DULoxetine (CYMBALTA) 60 MG capsule Take 60 mg by mouth daily.      Historical Provider, MD  folic acid (FOLVITE) 1 MG tablet Take 2 tablets (2 mg total) by mouth daily. 10/26/16 01/19/18  Bo Merino, MD  furosemide (LASIX) 20 MG tablet Take 1 tablet by mouth daily. 01/20/15   Historical Provider, MD  HYDROcodone-acetaminophen (NORCO) 10-325 MG per tablet Take 1 tablet by mouth every 6 (six) hours as needed for moderate pain.     Historical Provider, MD  ibuprofen (ADVIL,MOTRIN) 200 MG tablet Take 400 mg by mouth every 6 (six) hours as needed for pain.    Historical Provider, MD  meloxicam (MOBIC) 15 MG tablet Take 15 mg by mouth daily.      Historical Provider, MD  methotrexate 50 MG/2ML injection Inject 0.8 mL sub Q once a week 10/26/16   Abel Presto  Deveshwar, MD  metoprolol tartrate (LOPRESSOR) 25 MG tablet  08/09/16   Historical Provider, MD  penicillin v potassium (VEETID) 500 MG tablet Take 1 tablet (500 mg total) by mouth 3 (three) times daily. 08/20/16   Robyn Haber, MD  tiZANidine (ZANAFLEX) 4 MG tablet Take 4 mg by mouth every 6 (six) hours as needed (for muscle spasms).     Historical Provider, MD  Tuberculin-Allergy Syringes 27G X 1/2" 1 ML MISC Inject 1 Syringe into the skin once a week. 10/26/16    Bo Merino, MD  ZETIA 10 MG tablet Take 1 tablet by mouth daily. 12/18/14   Historical Provider, MD    Family History No family history on file.  Social History Social History  Substance Use Topics  . Smoking status: Current Every Day Smoker    Packs/day: 0.50    Types: Cigarettes  . Smokeless tobacco: Never Used  . Alcohol use No     Allergies   Patient has no known allergies.   Review of Systems Review of Systems  Constitutional: Negative for fever.  Musculoskeletal: Positive for arthralgias.  Skin: Negative for rash and wound.     Physical Exam Updated Vital Signs BP 151/97 (BP Location: Right Arm)   Pulse (!) 121   Temp 98.3 F (36.8 C) (Oral)   Resp 16   SpO2 99%   Physical Exam  Constitutional: She appears well-developed and well-nourished. No distress.  HENT:  Head: Atraumatic.  Eyes: Conjunctivae are normal.  Neck: Neck supple.  Musculoskeletal: She exhibits tenderness (Right knee: Tenderness to anterior lateral knee on palpation with decreased knee flexion due to pain. No joint laxity, no overlying skin changes, no erythema or warmth and no deformity.).  Intact pedal pulses. No pain to palpation of calves, no palpable cords erythema or edema to bilateral lower extremities.  Right hip and right ankle and nontender.  Neurological: She is alert.  Skin: No rash noted.  Psychiatric: She has a normal mood and affect.  Nursing note and vitals reviewed.    ED Treatments / Results  Labs (all labs ordered are listed, but only abnormal results are displayed) Labs Reviewed - No data to display  EKG  EKG Interpretation None       Radiology No results found.  Procedures Procedures (including critical care time)  Medications Ordered in ED Medications  ketorolac (TORADOL) injection 60 mg (60 mg Intramuscular Given 10/28/16 0252)     Initial Impression / Assessment and Plan / ED Course  I have reviewed the triage vital signs and the nursing  notes.  Pertinent labs & imaging results that were available during my care of the patient were reviewed by me and considered in my medical decision making (see chart for details).  Clinical Course    BP 123/81   Pulse 96   Temp 98.3 F (36.8 C) (Oral)   Resp 17   SpO2 98%    Final Clinical Impressions(s) / ED Diagnoses   Final diagnoses:  Right knee pain, unspecified chronicity    New Prescriptions New Prescriptions   No medications on file   2:27 AM Patient here with reproducible pain to her right knee that she felt similar to prior flareup of her osteoarthritis or rheumatoid arthritis. No signs of infection noted on exam, no signs to suggest gout. Low suspicion for DVT. After discussion, will give patient a shot of Toradol. Patient will follow-up with her doctor for a knee injection as it has helped her in the past.  Return precaution discussed   Domenic Moras, PA-C 10/28/16 [id number]    Everlene Balls, MD 10/28/16 [phone]

## 2016-10-28 NOTE — Telephone Encounter (Signed)
Patient says  pharmacy has not received her refills requested (humira and methotrexate)  Please call her and let her know what she needs to do. Cb# [phone]

## 2016-10-28 NOTE — Telephone Encounter (Signed)
Attempted to contact the patient to advise prescriptions have been sent to the [facility] as requested. Patient may want to call the [facility] to have them back out the prescriptions that were sent there. Unable to leave patient a message because mailbox is full.

## 2016-10-28 NOTE — Telephone Encounter (Signed)
Spoke to patient and informed her that her prescriptions have been sent to [facility].  Prescriptions defaulted to [facility] on 10/26/16.  I called and spoke to [facility] and cancelled the prescriptions that were sent there.   Advised patient that her prescriptions are now at [facility]  She reports understanding and denies any further questions.

## 2016-10-28 NOTE — ED Notes (Signed)
[place] at bedside

## 2016-10-28 NOTE — Discharge Instructions (Signed)
Please follow up with your rheumatologist for further management of your knee pain.

## 2016-10-29 ENCOUNTER — Telehealth: Payer: Self-pay | Admitting: Rheumatology

## 2016-10-29 NOTE — Telephone Encounter (Signed)
Patient states she is having pain in her knee and it is swelling. Patient states she was seen in the emergency room yesterday and was given and an injection of Toradol. Patient states she has not gotten any relief. Patient has an appointment on 11/02/16. Patient encouraged to keep that appointment. Patient states she has Hydrocodone. Patient advised to take it as prescribed, to elevate her leg and alternate heat and ice. Patient advised if the pain worsens to return to the emergency room. Patient verbalized understanding.

## 2016-10-29 NOTE — Telephone Encounter (Signed)
Patient has scheduled appt for 11/02/16 but would like to speak to the nurse today about her leg pain and what she can do to ease the pain over the weekend.

## 2016-10-31 ENCOUNTER — Emergency Department (HOSPITAL_COMMUNITY)
Admission: EM | Admit: 2016-10-31 | Discharge: 2016-10-31 | Disposition: A | Discharge status: Home / Self Care | Payer: Medicare Other | Attending: Emergency Medicine | Admitting: Emergency Medicine

## 2016-10-31 ENCOUNTER — Encounter (HOSPITAL_COMMUNITY): Payer: Self-pay

## 2016-10-31 DIAGNOSIS — I10 Essential (primary) hypertension: Secondary | ICD-10-CM | POA: Insufficient documentation

## 2016-10-31 DIAGNOSIS — M25561 Pain in right knee: Secondary | ICD-10-CM | POA: Diagnosis not present

## 2016-10-31 DIAGNOSIS — G8929 Other chronic pain: Secondary | ICD-10-CM | POA: Insufficient documentation

## 2016-10-31 DIAGNOSIS — F17210 Nicotine dependence, cigarettes, uncomplicated: Secondary | ICD-10-CM | POA: Diagnosis not present

## 2016-10-31 DIAGNOSIS — Z79899 Other long term (current) drug therapy: Secondary | ICD-10-CM | POA: Insufficient documentation

## 2016-10-31 MED ORDER — OXYCODONE HCL 5 MG PO TABS
5.0000 mg | ORAL_TABLET | Freq: Once | ORAL | Status: AC
  Administered 2016-10-31: 5 mg via ORAL
  Filled 2016-10-31: qty 1

## 2016-10-31 MED ORDER — KETOROLAC TROMETHAMINE 60 MG/2ML IM SOLN
60.0000 mg | Freq: Once | INTRAMUSCULAR | Status: AC
  Administered 2016-10-31: 60 mg via INTRAMUSCULAR
  Filled 2016-10-31: qty 2

## 2016-10-31 MED ORDER — ACETAMINOPHEN 500 MG PO TABS
1000.0000 mg | ORAL_TABLET | Freq: Once | ORAL | Status: AC
  Administered 2016-10-31: 1000 mg via ORAL
  Filled 2016-10-31: qty 2

## 2016-10-31 MED ORDER — DIAZEPAM 5 MG PO TABS
5.0000 mg | ORAL_TABLET | Freq: Once | ORAL | Status: AC
  Administered 2016-10-31: 5 mg via ORAL
  Filled 2016-10-31: qty 1

## 2016-10-31 NOTE — Progress Notes (Signed)
Orthopedic Tech Progress Note Patient Details:  Shelby Robertson [birth year] [id number]  Ortho Devices Type of Ortho Device: Ace wrap, Knee Sleeve Ortho Device/Splint Location: rle Ortho Device/Splint Interventions: Ordered, Application   Karolee Stamps 10/31/2016, 7:20 AM

## 2016-10-31 NOTE — ED Notes (Signed)
Pt understood dc material. NAD Noted 

## 2016-10-31 NOTE — Discharge Instructions (Signed)
Take 4 over the counter ibuprofen tablets 3 times a day or 2 over-the-counter naproxen tablets twice a day for pain. Also take tylenol 1000mg(2 extra strength) four times a day.    

## 2016-10-31 NOTE — ED Provider Notes (Signed)
[facility] DEPT Provider Note   CSN: [id number] Arrival date & time: 10/31/16  0425     History   Chief Complaint Chief Complaint  Patient presents with  . Knee Pain    HPI Shelby Robertson is a 46 y.o. female.  46 yo F with a cc of R knee pain. This is a chronic issue for this patient. She denies any recent injury. Was seen a couple days ago and given a Toradol injection. She feels it does not help in his back for more medicine. Normally takes Humira and cymbalta for this.     The history is provided by the patient.  Knee Pain   This is a new problem. The current episode started more than 1 week ago. The problem occurs constantly. The problem has not changed since onset.The pain is at a severity of 6/10. The pain is moderate. Pertinent negatives include full range of motion. She has tried nothing for the symptoms. The treatment provided no relief. There has been no history of extremity trauma.    Past Medical History:  Diagnosis Date  . Fibromyalgia   . Fibromyalgia   . Hypertension   . Osteoarthritis   . Rheumatoid arthritis(714.0)   . Rheumatoid arthritis(714.0)     Patient Active Problem List   Diagnosis Date Noted  . High risk medication use 08/21/2016  . Rheumatoid arthritis of multiple sites without rheumatoid factor ([place]) 08/12/2016  . ARTHRITIS, GENERALIZED 07/14/2010  . UTI 10/27/2009  . FURUNCULOSIS 08/19/2009  . HYPERTENSION, BENIGN ESSENTIAL 03/04/2009  . POLYURIA 03/04/2009  . FIBROMYALGIA 11/09/2007  . HEADACHE 11/09/2007  . HIDRADENITIS SUPPURATIVA 07/11/2007  . DYSLIPIDEMIA 06/28/2007  . LEG EDEMA, BILATERAL 05/05/2007    History reviewed. No pertinent surgical history.  OB History    No data available       Home Medications    Prior to Admission medications   Medication Sig Start Date End Date Taking? Authorizing Provider  acetaminophen (TYLENOL) 325 MG tablet Take 650 mg by mouth every 6 (six) hours as needed for pain.     Historical Provider, MD  Adalimumab (HUMIRA PEN) 40 MG/0.8ML PNKT Inject 1 pen into the skin every 14 (fourteen) days. 10/28/16 01/26/17  Bo Merino, MD  amLODipine-olmesartan (AZOR) 10-40 MG tablet Take 1 tablet by mouth daily. 11/25/15   Historical Provider, MD  butalbital-acetaminophen-caffeine (FIORICET, ESGIC) 50-325-40 MG tablet Take 1-2 tablets by mouth every 6 (six) hours as needed for headache. 08/20/16 08/20/17  Robyn Haber, MD  DULoxetine (CYMBALTA) 60 MG capsule Take 60 mg by mouth daily.      Historical Provider, MD  folic acid (FOLVITE) 1 MG tablet Take 2 tablets (2 mg total) by mouth daily. 10/28/16 01/21/18  Bo Merino, MD  furosemide (LASIX) 20 MG tablet Take 1 tablet by mouth daily. 01/20/15   Historical Provider, MD  HYDROcodone-acetaminophen (NORCO) 10-325 MG per tablet Take 1 tablet by mouth every 6 (six) hours as needed for moderate pain.     Historical Provider, MD  ibuprofen (ADVIL,MOTRIN) 200 MG tablet Take 400 mg by mouth every 6 (six) hours as needed for pain.    Historical Provider, MD  meloxicam (MOBIC) 15 MG tablet Take 15 mg by mouth daily.      Historical Provider, MD  methotrexate 50 MG/2ML injection Inject 0.8 mL sub Q once a week 10/28/16   Bo Merino, MD  metoprolol tartrate (LOPRESSOR) 25 MG tablet  08/09/16   Historical Provider, MD  penicillin v potassium (VEETID) 500 MG  tablet Take 1 tablet (500 mg total) by mouth 3 (three) times daily. 08/20/16   Robyn Haber, MD  tiZANidine (ZANAFLEX) 4 MG tablet Take 4 mg by mouth every 6 (six) hours as needed (for muscle spasms).     Historical Provider, MD  Tuberculin-Allergy Syringes 27G X 1/2" 1 ML MISC Inject 1 Syringe into the skin once a week. 10/28/16   Bo Merino, MD  ZETIA 10 MG tablet Take 1 tablet by mouth daily. 12/18/14   Historical Provider, MD    Family History No family history on file.  Social History Social History  Substance Use Topics  . Smoking status: Current Every Day Smoker     Packs/day: 0.50    Types: Cigarettes  . Smokeless tobacco: Never Used  . Alcohol use No     Allergies   Patient has no known allergies.   Review of Systems Review of Systems  Constitutional: Negative for chills and fever.  HENT: Negative for congestion and rhinorrhea.   Eyes: Negative for redness and visual disturbance.  Respiratory: Negative for shortness of breath and wheezing.   Cardiovascular: Negative for chest pain and palpitations.  Gastrointestinal: Negative for nausea and vomiting.  Genitourinary: Negative for dysuria and urgency.  Musculoskeletal: Positive for arthralgias and myalgias.  Skin: Negative for pallor and wound.  Neurological: Negative for dizziness and headaches.     Physical Exam Updated Vital Signs BP 116/77   Pulse 101   Temp 97.7 F (36.5 C) (Oral)   Resp 18   SpO2 97%   Physical Exam  Constitutional: She is oriented to person, place, and time. She appears well-developed and well-nourished. No distress.  HENT:  Head: Normocephalic and atraumatic.  Eyes: EOM are normal. Pupils are equal, round, and reactive to light.  Neck: Normal range of motion. Neck supple.  Cardiovascular: Normal rate and regular rhythm.  Exam reveals no gallop and no friction rub.   No murmur heard. Pulmonary/Chest: Effort normal. She has no wheezes. She has no rales.  Abdominal: Soft. She exhibits no distension. There is no tenderness. There is no guarding.  Musculoskeletal: She exhibits tenderness (TTP diffusely about the knee.  No noted swelling or erythema. ). She exhibits no edema.  Neurological: She is alert and oriented to person, place, and time.  Skin: Skin is warm and dry. She is not diaphoretic.  Psychiatric: She has a normal mood and affect. Her behavior is normal.  Nursing note and vitals reviewed.    ED Treatments / Results  Labs (all labs ordered are listed, but only abnormal results are displayed) Labs Reviewed - No data to display  EKG  EKG  Interpretation None       Radiology No results found.  Procedures Procedures (including critical care time)  Medications Ordered in ED Medications  ketorolac (TORADOL) injection 60 mg (not administered)  acetaminophen (TYLENOL) tablet 1,000 mg (not administered)  oxyCODONE (Oxy IR/ROXICODONE) immediate release tablet 5 mg (not administered)  diazepam (VALIUM) tablet 5 mg (not administered)     Initial Impression / Assessment and Plan / ED Course  I have reviewed the triage vital signs and the nursing notes.  Pertinent labs & imaging results that were available during my care of the patient were reviewed by me and considered in my medical decision making (see chart for details).  Clinical Course     46 yo F with chronic R knee pain.  Denies injury.  Pain treated here, encouraged NSAID's and tylenol.  Will give crutches, knee  sleeve. Ortho follow up.   6:12 AM:  I have discussed the diagnosis/risks/treatment options with the patient and family and believe the pt to be eligible for discharge home to follow-up with Ortho. We also discussed returning to the ED immediately if new or worsening sx occur. We discussed the sx which are most concerning (e.g., sudden worsening pain, fever, inability to tolerate by mouth) that necessitate immediate return. Medications administered to the patient during their visit and any new prescriptions provided to the patient are listed below.  Medications given during this visit Medications  ketorolac (TORADOL) injection 60 mg (not administered)  acetaminophen (TYLENOL) tablet 1,000 mg (not administered)  oxyCODONE (Oxy IR/ROXICODONE) immediate release tablet 5 mg (not administered)  diazepam (VALIUM) tablet 5 mg (not administered)     The patient appears reasonably screen and/or stabilized for discharge and I doubt any other medical condition or other [facility] requiring further screening, evaluation, or treatment in the ED at this time prior to  discharge.    Final Clinical Impressions(s) / ED Diagnoses   Final diagnoses:  Chronic pain of right knee    New Prescriptions New Prescriptions   No medications on file     Deno Etienne, DO 10/31/16 [id number]

## 2016-10-31 NOTE — ED Triage Notes (Signed)
Pt has chronic R knee pain, seen here two days ago for the same, states Toradol injection did not work, pain is still consistent.

## 2016-11-02 ENCOUNTER — Ambulatory Visit (INDEPENDENT_AMBULATORY_CARE_PROVIDER_SITE_OTHER): Payer: Medicare Other

## 2016-11-02 ENCOUNTER — Encounter: Payer: Self-pay | Admitting: Rheumatology

## 2016-11-02 ENCOUNTER — Ambulatory Visit (INDEPENDENT_AMBULATORY_CARE_PROVIDER_SITE_OTHER): Payer: Self-pay

## 2016-11-02 ENCOUNTER — Ambulatory Visit (INDEPENDENT_AMBULATORY_CARE_PROVIDER_SITE_OTHER): Payer: Medicare Other | Admitting: Rheumatology

## 2016-11-02 VITALS — BP 142/80 | HR 90 | Resp 20 | Ht 66.0 in | Wt 319.0 lb

## 2016-11-02 DIAGNOSIS — M25561 Pain in right knee: Secondary | ICD-10-CM

## 2016-11-02 DIAGNOSIS — M0609 Rheumatoid arthritis without rheumatoid factor, multiple sites: Secondary | ICD-10-CM | POA: Diagnosis not present

## 2016-11-02 DIAGNOSIS — M25562 Pain in left knee: Secondary | ICD-10-CM

## 2016-11-02 DIAGNOSIS — M797 Fibromyalgia: Secondary | ICD-10-CM | POA: Diagnosis not present

## 2016-11-02 DIAGNOSIS — R5382 Chronic fatigue, unspecified: Secondary | ICD-10-CM

## 2016-11-02 DIAGNOSIS — Z79899 Other long term (current) drug therapy: Secondary | ICD-10-CM | POA: Diagnosis not present

## 2016-11-02 DIAGNOSIS — F5101 Primary insomnia: Secondary | ICD-10-CM

## 2016-11-02 LAB — COMPLETE METABOLIC PANEL WITH GFR
ALBUMIN: 3.9 g/dL (ref 3.6–5.1)
ALK PHOS: 79 U/L (ref 33–115)
ALT: 10 U/L (ref 6–29)
AST: 12 U/L (ref 10–30)
BILIRUBIN TOTAL: 0.3 mg/dL (ref 0.2–1.2)
BUN: 7 mg/dL (ref 7–25)
CALCIUM: 9.4 mg/dL (ref 8.6–10.2)
CO2: 23 mmol/L (ref 20–31)
CREATININE: 0.62 mg/dL (ref 0.50–1.10)
Chloride: 103 mmol/L (ref 98–110)
Glucose, Bld: 106 mg/dL — ABNORMAL HIGH (ref 65–99)
Potassium: 4 mmol/L (ref 3.5–5.3)
Sodium: 136 mmol/L (ref 135–146)
TOTAL PROTEIN: 7.9 g/dL (ref 6.1–8.1)

## 2016-11-02 LAB — CBC WITH DIFFERENTIAL/PLATELET
BASOS ABS: 0 {cells}/uL (ref 0–200)
BASOS PCT: 0 %
EOS ABS: 105 {cells}/uL (ref 15–500)
Eosinophils Relative: 1 %
HEMATOCRIT: 38.4 % (ref 35.0–45.0)
HEMOGLOBIN: 12.8 g/dL (ref 11.7–15.5)
Lymphocytes Relative: 34 %
Lymphs Abs: 3570 cells/uL (ref 850–3900)
MCH: 29.1 pg (ref 27.0–33.0)
MCHC: 33.3 g/dL (ref 32.0–36.0)
MCV: 87.3 fL (ref 80.0–100.0)
MONO ABS: 525 {cells}/uL (ref 200–950)
MONOS PCT: 5 %
MPV: 8.8 fL (ref 7.5–12.5)
NEUTROS ABS: 6300 {cells}/uL (ref 1500–7800)
Neutrophils Relative %: 60 %
Platelets: 432 10*3/uL — ABNORMAL HIGH (ref 140–400)
RBC: 4.4 MIL/uL (ref 3.80–5.10)
RDW: 14.9 % (ref 11.0–15.0)
WBC: 10.5 10*3/uL (ref 3.8–10.8)

## 2016-11-02 MED ORDER — LIDOCAINE HCL 1 % IJ SOLN
1.5000 mL | INTRAMUSCULAR | Status: AC | PRN
  Administered 2016-11-02: 1.5 mL

## 2016-11-02 MED ORDER — TRIAMCINOLONE ACETONIDE 40 MG/ML IJ SUSP
40.0000 mg | INTRAMUSCULAR | Status: AC | PRN
  Administered 2016-11-02: 40 mg via INTRA_ARTICULAR

## 2016-11-02 NOTE — Patient Instructions (Signed)

## 2016-11-02 NOTE — Progress Notes (Signed)
Office Visit Note  Patient: Shelby Robertson             Date of Birth: [birth year]           MRN: [id number]             PCP: Minette Brine Referring: Minette Brine, FNP Visit Date: 11/02/2016 Occupation: @GUAROCC @    Subjective:  Pain of the Right Knee and Follow-up Follow-up on rheumatoid arthritis, Humira, methotrexate, fibromyalgia. Today, she is complaining about right knee pain that's been occurring for the last 1 week.  History of Present Illness: Shelby Robertson is a 46 y.o. female  Last seen 03/22/2016 History of rheumatoid arthritis that is severe and history of erosive disease. On the last visit she had no synovitis on examination; no joint pain swelling or stiffness.   She is on Humira every 2 weeks, methotrexate 0.8 ML's every week, folic acid 2 mg every day. Adequate response.  History of fibromyalgia. It was not active at the last visit nor did she have any tender points.  Her TB gold is up-to-date as of 03/22/2016.  Today, she is complaining about right knee pain that's been going on for a week. No falls no injuries. She rates her discomfort as 8 on a scale of 0-10. She saw the emergency room and they gave her crutches. She is not getting any better. She has not failed her medication for her rheumatoid arthritis. She is on Humira every 2 weeks and methotrexate 0.8 ML's. Her fibromyalgia is not really bothering her too much.  Her last labs were from November 2017 and she is due for labs today.  Activities of Daily Living:  Patient reports morning stiffness for 15 minutes.   Patient Denies nocturnal pain.  Difficulty dressing/grooming: Denies Difficulty climbing stairs: Denies Difficulty getting out of chair: Denies Difficulty using hands for taps, buttons, cutlery, and/or writing: Denies   Review of Systems  Constitutional: Negative for fatigue.  HENT: Negative for mouth sores and mouth dryness.   Eyes: Negative for dryness.  Respiratory: Negative for  shortness of breath.   Gastrointestinal: Negative for constipation and diarrhea.  Musculoskeletal: Negative for myalgias and myalgias.  Skin: Negative for sensitivity to sunlight.  Psychiatric/Behavioral: Negative for decreased concentration and sleep disturbance.    PMFS History:  Patient Active Problem List   Diagnosis Date Noted  . High risk medication use 08/21/2016  . Rheumatoid arthritis of multiple sites without rheumatoid factor ([place]) 08/12/2016  . ARTHRITIS, GENERALIZED 07/14/2010  . UTI 10/27/2009  . FURUNCULOSIS 08/19/2009  . HYPERTENSION, BENIGN ESSENTIAL 03/04/2009  . POLYURIA 03/04/2009  . FIBROMYALGIA 11/09/2007  . HEADACHE 11/09/2007  . HIDRADENITIS SUPPURATIVA 07/11/2007  . DYSLIPIDEMIA 06/28/2007  . LEG EDEMA, BILATERAL 05/05/2007    Past Medical History:  Diagnosis Date  . Fibromyalgia   . Fibromyalgia   . Hypertension   . Osteoarthritis   . Rheumatoid arthritis(714.0)   . Rheumatoid arthritis(714.0)     No family history on file. No past surgical history on file. Social History   Social History Narrative  . No narrative on file     Objective: Vital Signs: BP (!) 142/80   Pulse 90   Resp 20   Ht 5' 6"  (1.676 m)   Wt (!) 319 lb (144.7 kg)   LMP 10/15/2016 (Approximate)   BMI 51.49 kg/m    Physical Exam  Constitutional: She is oriented to person, place, and time. She appears well-developed and well-nourished.  HENT:  Head: Normocephalic and atraumatic.  Eyes: EOM are normal. Pupils are equal, round, and reactive to light.  Cardiovascular: Normal rate, regular rhythm and normal heart sounds.  Exam reveals no gallop and no friction rub.   No murmur heard. Pulmonary/Chest: Effort normal and breath sounds normal. She has no wheezes. She has no rales.  Abdominal: Soft. Bowel sounds are normal. She exhibits no distension. There is no tenderness. There is no guarding. No hernia.  Musculoskeletal: Normal range of motion. She exhibits no edema,  tenderness or deformity.  Lymphadenopathy:    She has no cervical adenopathy.  Neurological: She is alert and oriented to person, place, and time. Coordination normal.  Skin: Skin is warm and dry. Capillary refill takes less than 2 seconds. No rash noted.  Psychiatric: She has a normal mood and affect. Her behavior is normal.  Nursing note and vitals reviewed.    Musculoskeletal Exam:  Full range of motion of all joints Grip strength is equal and strong bilaterally Fiber myalgia tender points are all absent  CDAI Exam: CDAI Homunculus Exam:   Tenderness:  RLE: tibiofemoral  Joint Counts:  CDAI Tender Joint count: 1 CDAI Swollen Joint count: 0   No synovitis  Investigation: No additional findings. Office Visit on 08/24/2016  Component Date Value Ref Range Status  . WBC 08/25/2016 8.8  3.8 - 10.8 K/uL Final  . RBC 08/25/2016 4.55  3.80 - 5.10 MIL/uL Final  . Hemoglobin 08/25/2016 13.2  11.7 - 15.5 g/dL Final  . HCT 08/25/2016 39.5  35.0 - 45.0 % Final  . MCV 08/25/2016 86.8  80.0 - 100.0 fL Final  . MCH 08/25/2016 29.0  27.0 - 33.0 pg Final  . MCHC 08/25/2016 33.4  32.0 - 36.0 g/dL Final  . RDW 08/25/2016 15.8* 11.0 - 15.0 % Final  . Platelets 08/25/2016 414* 140 - 400 K/uL Final  . MPV 08/25/2016 9.3  7.5 - 12.5 fL Final  . Neutro Abs 08/25/2016 5192  1,500 - 7,800 cells/uL Final  . Lymphs Abs 08/25/2016 2992  850 - 3,900 cells/uL Final  . Monocytes Absolute 08/25/2016 440  200 - 950 cells/uL Final  . Eosinophils Absolute 08/25/2016 176  15 - 500 cells/uL Final  . Basophils Absolute 08/25/2016 0  0 - 200 cells/uL Final  . Neutrophils Relative % 08/25/2016 59  % Final  . Lymphocytes Relative 08/25/2016 34  % Final  . Monocytes Relative 08/25/2016 5  % Final  . Eosinophils Relative 08/25/2016 2  % Final  . Basophils Relative 08/25/2016 0  % Final  . Smear Review 08/25/2016 Criteria for review not met   Final  . Sodium 08/25/2016 139  135 - 146 mmol/L Final  .  Potassium 08/25/2016 4.0  3.5 - 5.3 mmol/L Final  . Chloride 08/25/2016 107  98 - 110 mmol/L Final  . CO2 08/25/2016 23  20 - 31 mmol/L Final  . Glucose, Bld 08/25/2016 119* 65 - 99 mg/dL Final  . BUN 08/25/2016 5* 7 - 25 mg/dL Final  . Creat 08/25/2016 0.63  0.50 - 1.10 mg/dL Final  . Total Bilirubin 08/25/2016 0.3  0.2 - 1.2 mg/dL Final  . Alkaline Phosphatase 08/25/2016 65  33 - 115 U/L Final  . AST 08/25/2016 12  10 - 30 U/L Final  . ALT 08/25/2016 14  6 - 29 U/L Final  . Total Protein 08/25/2016 7.5  6.1 - 8.1 g/dL Final  . Albumin 08/25/2016 4.0  3.6 - 5.1 g/dL Final  . Calcium 08/25/2016 9.3  8.6 -  10.2 mg/dL Final  . GFR, Est African American 08/25/2016 >89  >=60 mL/min Final  . GFR, Est Non African American 08/25/2016 >89  >=60 mL/min Final     Imaging: No results found.  Speciality Comments: No specialty comments available.    Procedures:  Large Joint Inj Date/Time: 11/02/2016 3:38 PM Performed by: Eliezer Lofts Authorized by: Eliezer Lofts   Consent Given by:  Patient Site marked: the procedure site was marked   Timeout: prior to procedure the correct patient, procedure, and site was verified   Indications:  Pain and joint swelling Location:  Knee Site:  R knee Prep: patient was prepped and draped in usual sterile fashion   Needle Size:  27 G Needle Length:  1.5 inches Approach:  Medial Ultrasound Guidance: No   Fluoroscopic Guidance: No   Arthrogram: No   Medications:  1.5 mL lidocaine 1 %; 40 mg triamcinolone acetonide 40 MG/ML Aspiration Attempted: Yes   Patient tolerance:  Patient tolerated the procedure well with no immediate complications  No complication   Allergies: Patient has no known allergies.   Assessment / Plan:     Visit Diagnoses: High risk medications (not anticoagulants) long-term use - Humira Q 2wks; MTX 0.80m/wk; Folic 237md; March 22, 2016 TB GOLD NEG; - Plan: CBC with Differential/Platelet, Comprehensive metabolic panel, CBC with  Differential/Platelet, COMPLETE METABOLIC PANEL WITH GFR  Rheumatoid arthritis of multiple sites with negative rheumatoid factor (HCC) - Hx of Severe, Erosive Dz; 11/02/2016:==> no synovitis; no jt pain,swelling, or stiffness;  Fibromyalgia  Chronic fatigue  Primary insomnia  Acute pain of right knee - Plan: XR KNEE 3 VIEW RIGHT, XR KNEE 3 VIEW LEFT   Plan: #1: History of rheumatoid arthritis with severe RA and erosive disease with ulnar deviation of her left hand more so than her right hand. She is doing well currently. No joint pain or swelling.  #2: High risk prescription includes Humira every 2 months, methotrexate 0.8 ML's every week and folic acid 2 mg every day. Adequate response from this medication.  #3: Complaining of right knee joint pain. Started about a week ago. Rates her pain as 8 on a scale of 0-10. Falls no injuries. Could be RA but I suspect that it's OA. We'll do x-rays today to verify. After informed consent was obtained the site was prepped in usual sterile fashion injected with 40 mg of Kenalog mixed with 1% lidocaine. Patient tolerated procedure well. No complications  #4: X-ray of bilateral knees today.  #5: CBC with differential CMP with GFR today.  #6: TB gold is up-to-date as of June 2017.  #7: Recently saw dentist and has received hydrocodone from the dentist.  #8: Refill meds we provide if needed  #9: Last x-ray that I gave the patient was for the left knee joint Baker's cyst dated 02/20/2015  #10: History of fibromyalgia. Not very active at this time.  #11: Apply for Hyalgan bilateral knees 5  Orders: Orders Placed This Encounter  Procedures  . Large Joint Injection/Arthrocentesis  . XR KNEE 3 VIEW RIGHT  . XR KNEE 3 VIEW LEFT  . CBC with Differential/Platelet  . Comprehensive metabolic panel  . CBC with Differential/Platelet  . COMPLETE METABOLIC PANEL WITH GFR   No orders of the defined types were placed in this  encounter.   Face-to-face time spent with patient was 30 minutes. 50% of time was spent in counseling and coordination of care.  Follow-Up Instructions: Return in about 5 months (around 04/02/2017).  Eliezer Lofts, PA-C  Note - This record has been created using [employer].  Chart creation errors have been sought, but may not always  have been located. Such creation errors do not reflect on  the standard of medical care.

## 2016-11-05 ENCOUNTER — Other Ambulatory Visit: Payer: Self-pay | Admitting: Radiology

## 2016-11-05 NOTE — Telephone Encounter (Signed)
Refill request received via fax for methotrexate/ [facility]

## 2016-11-06 NOTE — Progress Notes (Signed)
Tell patient#1 CBC with differential is normal except for elevated platelet. History of same. We can monitor.#2: CMP with GFR is normal except for nonfasting glucose elevated to 106#3: No change in treatment at this time.#4: Send copy of this lab to PCP

## 2016-11-12 ENCOUNTER — Ambulatory Visit: Payer: Medicare Other | Admitting: Rheumatology

## 2016-11-14 ENCOUNTER — Encounter (HOSPITAL_COMMUNITY): Payer: Self-pay | Admitting: *Deleted

## 2016-11-14 ENCOUNTER — Emergency Department (HOSPITAL_COMMUNITY): Payer: Medicare Other

## 2016-11-14 ENCOUNTER — Emergency Department (HOSPITAL_COMMUNITY)
Admission: EM | Admit: 2016-11-14 | Discharge: 2016-11-14 | Disposition: A | Discharge status: Home / Self Care | Payer: Medicare Other | Attending: Emergency Medicine | Admitting: Emergency Medicine

## 2016-11-14 DIAGNOSIS — Z79899 Other long term (current) drug therapy: Secondary | ICD-10-CM | POA: Diagnosis not present

## 2016-11-14 DIAGNOSIS — I10 Essential (primary) hypertension: Secondary | ICD-10-CM | POA: Diagnosis not present

## 2016-11-14 DIAGNOSIS — G8929 Other chronic pain: Secondary | ICD-10-CM | POA: Insufficient documentation

## 2016-11-14 DIAGNOSIS — M25461 Effusion, right knee: Secondary | ICD-10-CM | POA: Diagnosis not present

## 2016-11-14 DIAGNOSIS — M25561 Pain in right knee: Secondary | ICD-10-CM

## 2016-11-14 DIAGNOSIS — F17210 Nicotine dependence, cigarettes, uncomplicated: Secondary | ICD-10-CM | POA: Insufficient documentation

## 2016-11-14 NOTE — ED Triage Notes (Signed)
Pt reports falling down approx 6 steps last night, denies loc. Now has right knee pain.

## 2016-11-14 NOTE — ED Provider Notes (Signed)
[facility] DEPT Provider Note   CSN: [id number] Arrival date & time: 11/14/16  1138  By signing my name below, I, Shelby Robertson, attest that this documentation has been prepared under the direction and in the presence of Margarita Mail, PA-C Electronically Signed: Julien Nordmann, ED Scribe. 11/14/16. 3:22 PM.    History   Chief Complaint Chief Complaint  Patient presents with  . Fall  . Knee Pain   The history is provided by the patient. No language interpreter was used.   HPI Comments: Shelby Robertson is a 46 y.o. female who has a PMhx of chronic right knee pain and fibromyalgia and osteoarthritis presents to the Emergency Department complaining of recurrent, chronic right knee pain that was exacerbated after a fall that occurred last night. Pt states that she fell down ~ 6 steps last night and re-injured her right knee. Pt did not hit her head or lose consciousness. She has associated swelling to the area. Pt was evaluated on 01/11 and 01/14 for similar knee pain in which she received a toradol shot without any relief. Pt ambulates with crutches. She notes taking one hydrocodone this morning to alleviate her pain without relief. Pt has also been applying icy hot and "pain patches" to her knee without relief. She has no other complaints.   Past Medical History:  Diagnosis Date  . Fibromyalgia   . Fibromyalgia   . Hypertension   . Osteoarthritis   . Rheumatoid arthritis(714.0)   . Rheumatoid arthritis(714.0)     Patient Active Problem List   Diagnosis Date Noted  . High risk medication use 08/21/2016  . Rheumatoid arthritis of multiple sites without rheumatoid factor ([place]) 08/12/2016  . ARTHRITIS, GENERALIZED 07/14/2010  . UTI 10/27/2009  . FURUNCULOSIS 08/19/2009  . HYPERTENSION, BENIGN ESSENTIAL 03/04/2009  . POLYURIA 03/04/2009  . FIBROMYALGIA 11/09/2007  . HEADACHE 11/09/2007  . HIDRADENITIS SUPPURATIVA 07/11/2007  . DYSLIPIDEMIA 06/28/2007  . LEG EDEMA,  BILATERAL 05/05/2007    History reviewed. No pertinent surgical history.  OB History    No data available       Home Medications    Prior to Admission medications   Medication Sig Start Date End Date Taking? Authorizing Provider  acetaminophen (TYLENOL) 325 MG tablet Take 650 mg by mouth every 6 (six) hours as needed for pain.    Historical Provider, MD  Adalimumab (HUMIRA PEN) 40 MG/0.8ML PNKT Inject 1 pen into the skin every 14 (fourteen) days. 10/28/16 01/26/17  Bo Merino, MD  amLODipine-olmesartan (AZOR) 10-40 MG tablet Take 1 tablet by mouth daily. 11/25/15   Historical Provider, MD  butalbital-acetaminophen-caffeine (FIORICET, ESGIC) 50-325-40 MG tablet Take 1-2 tablets by mouth every 6 (six) hours as needed for headache. 08/20/16 08/20/17  Robyn Haber, MD  DULoxetine (CYMBALTA) 60 MG capsule Take 60 mg by mouth daily.      Historical Provider, MD  folic acid (FOLVITE) 1 MG tablet Take 2 tablets (2 mg total) by mouth daily. 10/28/16 01/21/18  Bo Merino, MD  furosemide (LASIX) 20 MG tablet Take 1 tablet by mouth daily. 01/20/15   Historical Provider, MD  HYDROcodone-acetaminophen (NORCO) 10-325 MG per tablet Take 1 tablet by mouth every 6 (six) hours as needed for moderate pain.     Historical Provider, MD  ibuprofen (ADVIL,MOTRIN) 200 MG tablet Take 400 mg by mouth every 6 (six) hours as needed for pain.    Historical Provider, MD  meloxicam (MOBIC) 15 MG tablet Take 15 mg by mouth daily.  Historical Provider, MD  methotrexate 50 MG/2ML injection Inject 0.8 mL sub Q once a week 10/28/16   Bo Merino, MD  metoprolol tartrate (LOPRESSOR) 25 MG tablet  08/09/16   Historical Provider, MD  tiZANidine (ZANAFLEX) 4 MG tablet Take 4 mg by mouth every 6 (six) hours as needed (for muscle spasms).     Historical Provider, MD  Tuberculin-Allergy Syringes 27G X 1/2" 1 ML MISC Inject 1 Syringe into the skin once a week. 10/28/16   Bo Merino, MD  ZETIA 10 MG tablet Take 1  tablet by mouth daily. 12/18/14   Historical Provider, MD    Family History History reviewed. No pertinent family history.  Social History Social History  Substance Use Topics  . Smoking status: Current Every Day Smoker    Packs/day: 0.50    Types: Cigarettes  . Smokeless tobacco: Never Used  . Alcohol use No     Allergies   Patient has no known allergies.   Review of Systems Review of Systems  Constitutional: Negative for fever.  Musculoskeletal: Positive for arthralgias and joint swelling.  Neurological: Negative for syncope.     Physical Exam Updated Vital Signs BP 134/86 (BP Location: Left Arm)   Pulse 82   Temp 98.5 F (36.9 C) (Oral)   Resp 18   Ht 5\' 6"  (1.676 m)   Wt 300 lb (136.1 kg)   LMP 10/15/2016 (Approximate)   SpO2 94%   BMI 48.42 kg/m   Physical Exam  Constitutional: She is oriented to person, place, and time. She appears well-developed and well-nourished. No distress.  Morbidly obese female in no acute distress  HENT:  Head: Normocephalic.  Eyes: EOM are normal.  Neck: Normal range of motion.  Pulmonary/Chest: Effort normal.  Abdominal: She exhibits no distension.  Musculoskeletal: Normal range of motion. She exhibits edema and tenderness.  Pain with any movement of right knee, mild swelling, exam is limited by morbid obesity  Neurological: She is alert and oriented to person, place, and time.  Psychiatric: She has a normal mood and affect.  Nursing note and vitals reviewed.    ED Treatments / Results  DIAGNOSTIC STUDIES: Oxygen Saturation is 94% on RA, low by my interpretation.  COORDINATION OF CARE:  3:22 PM Discussed treatment plan with pt at bedside and pt agreed to plan.  Labs (all labs ordered are listed, but only abnormal results are displayed) Labs Reviewed - No data to display  EKG  EKG Interpretation None       Radiology No results found.  Procedures Procedures (including critical care time)  Medications  Ordered in ED Medications - No data to display   Initial Impression / Assessment and Plan / ED Course  I have reviewed the triage vital signs and the nursing notes.  Pertinent labs & imaging results that were available during my care of the patient were reviewed by me and considered in my medical decision making (see chart for details).       Final Clinical Impressions(s) / ED Diagnoses   Final diagnoses:  None  Patient X-Ray negative for obvious fracture or dislocation.  Pt advised to follow up with orthopedics. Patient given ace wrap while in ED, conservative therapy recommended and discussed. Patient will be discharged home & is agreeable with above plan. Returns precautions discussed. Pt appears safe for discharge.  I personally performed the services described in this documentation, which was scribed in my presence. The recorded information has been reviewed and is accurate.  New Prescriptions New Prescriptions   No medications on file     Margarita Mail, PA-C 11/15/16 [place], MD 11/17/16 [phone]

## 2016-11-17 ENCOUNTER — Encounter: Payer: Self-pay | Admitting: Cardiovascular Disease

## 2016-11-17 ENCOUNTER — Ambulatory Visit (INDEPENDENT_AMBULATORY_CARE_PROVIDER_SITE_OTHER): Payer: Medicare Other | Admitting: Cardiovascular Disease

## 2016-11-17 VITALS — BP 117/74 | HR 118 | Ht 66.0 in | Wt 305.0 lb

## 2016-11-17 DIAGNOSIS — F17210 Nicotine dependence, cigarettes, uncomplicated: Secondary | ICD-10-CM | POA: Diagnosis not present

## 2016-11-17 DIAGNOSIS — R000 Tachycardia, unspecified: Secondary | ICD-10-CM | POA: Diagnosis not present

## 2016-11-17 DIAGNOSIS — I10 Essential (primary) hypertension: Secondary | ICD-10-CM

## 2016-11-17 DIAGNOSIS — Z720 Tobacco use: Secondary | ICD-10-CM

## 2016-11-17 LAB — T4, FREE: FREE T4: 1 ng/dL (ref 0.8–1.8)

## 2016-11-17 LAB — TSH: TSH: 1.36 m[IU]/L

## 2016-11-17 MED ORDER — VARENICLINE TARTRATE 0.5 MG X 11 & 1 MG X 42 PO MISC: ORAL | 0 refills | Status: DC

## 2016-11-17 MED ORDER — METOPROLOL TARTRATE 50 MG PO TABS: 50.0000 mg | ORAL_TABLET | Freq: Two times a day (BID) | ORAL | 1 refills | Status: DC

## 2016-11-17 MED ORDER — VARENICLINE TARTRATE 1 MG PO TABS: 1.0000 mg | ORAL_TABLET | Freq: Two times a day (BID) | ORAL | 1 refills | Status: DC

## 2016-11-17 NOTE — Progress Notes (Signed)
Cardiology Office Note   Date:  11/17/2016   ID:  Shelby Robertson, DOB 02-05-1971, MRN [id number]  PCP:  Minette Brine  Cardiologist:   Skeet Latch, MD   Chief Complaint  Patient presents with  . New Evaluation    fast heart rate       History of Present Illness: Shelby Robertson is a 46 y.o. female with hypertension, hyperlipidemia, Rheumatoid arthritis, and fibromyalgia who presents for an evaluation of tachycardia.  Shelby Robertson saw Dr. Glendale Chard on 08/09/16 was noted to have a resting heart rate of 103. She noted that it is been elevated to 120 in the past. Sleep study was ordered and she was referred to cardiology for further evaluation.  She occasionally notes that her heart is racing. There is no associated chest pain or shortness of breath. It typically happens when she is stressed or having a lot of pain. She struggles with fibromyalgia and osteoarthritis. Her right knee is currently very painful. She has been working with pain management but her pain is never below is 7 out of 10 in severity. She has been taking Norco without relief.  When the pain is particularly severe she smokes more heavily. She doesn't drink much caffeine and does not use any over-the-counter cold or cough medications.  She was started on metoprolol approximately 3 months ago and has not noted much change in her heart rate. Shelby Robertson currently smokes 1/2 ppd.  She hasn't tried to quit smoking in the past.    Past Medical History:  Diagnosis Date  . Fibromyalgia   . Fibromyalgia   . Hypertension   . Osteoarthritis   . Rheumatoid arthritis(714.0)   . Rheumatoid arthritis(714.0)     No past surgical history on file.   Current Outpatient Prescriptions  Medication Sig Dispense Refill  . acetaminophen (TYLENOL) 325 MG tablet Take 650 mg by mouth every 6 (six) hours as needed for pain.    . Adalimumab (HUMIRA PEN) 40 MG/0.8ML PNKT Inject 1 pen into the skin every 14 (fourteen) days. 6 each 0    . amLODipine-olmesartan (AZOR) 10-40 MG tablet Take 1 tablet by mouth daily.    . butalbital-acetaminophen-caffeine (FIORICET, ESGIC) 50-325-40 MG tablet Take 1-2 tablets by mouth every 6 (six) hours as needed for headache. 10 tablet 0  . DULoxetine (CYMBALTA) 60 MG capsule Take 60 mg by mouth daily.      . folic acid (FOLVITE) 1 MG tablet Take 2 tablets (2 mg total) by mouth daily. 180 tablet 4  . furosemide (LASIX) 20 MG tablet Take 1 tablet by mouth daily.    [name] HYDROcodone-acetaminophen (NORCO) 10-325 MG per tablet Take 1 tablet by mouth every 6 (six) hours as needed for moderate pain.     [name] ibuprofen (ADVIL,MOTRIN) 200 MG tablet Take 400 mg by mouth every 6 (six) hours as needed for pain.    . meloxicam (MOBIC) 15 MG tablet Take 15 mg by mouth daily.      . methotrexate 50 MG/2ML injection Inject 0.8 mL sub Q once a week 12 mL 0  . metoprolol tartrate (LOPRESSOR) 50 MG tablet Take 1 tablet (50 mg total) by mouth 2 (two) times daily. 180 tablet 1  . tiZANidine (ZANAFLEX) 4 MG tablet Take 4 mg by mouth every 6 (six) hours as needed (for muscle spasms).     . Tuberculin-Allergy Syringes 27G X 1/2" 1 ML MISC Inject 1 Syringe into the skin once a week. 12 each 3  .  ZETIA 10 MG tablet Take 1 tablet by mouth daily.    . varenicline (CHANTIX CONTINUING MONTH PAK) 1 MG tablet Take 1 tablet (1 mg total) by mouth 2 (two) times daily. 60 tablet 1  . varenicline (CHANTIX STARTING MONTH PAK) 0.5 MG X 11 & 1 MG X 42 tablet Take one 0.5 mg tablet by mouth once daily for 3 days, then increase to one 0.5 mg tablet twice daily for 4 days, then increase to one 1 mg tablet twice daily. 53 tablet 0   No current facility-administered medications for this visit.     Allergies:   Patient has no known allergies.    Social History:  The patient  reports that she has been smoking Cigarettes.  She has been smoking about 0.50 packs per day. She has never used smokeless tobacco. She reports that she does not drink  alcohol or use drugs.   Family History:  The patient's family history includes Heart attack in her maternal grandmother; Stroke in her maternal grandmother.    ROS:  Please see the history of present illness.   Otherwise, review of systems are positive for none.   All other systems are reviewed and negative.    PHYSICAL EXAM: VS:  BP 117/74 (BP Location: Left Arm, Patient Position: Sitting, Cuff Size: Large)   Pulse (!) 118   Ht 5\' 6"  (1.676 m)   Wt (!) 138.3 kg (305 lb)   BMI 49.23 kg/m  , BMI Body mass index is 49.23 kg/m. GENERAL:  Well appearing HEENT:  Pupils equal round and reactive, fundi not visualized, oral mucosa unremarkable NECK:  No jugular venous distention, waveform within normal limits, carotid upstroke brisk and symmetric, no bruits, no thyromegaly LYMPHATICS:  No cervical adenopathy LUNGS:  Clear to auscultation bilaterally HEART:  RRR.  Tachycardic.  PMI not displaced or sustained,S1 and S2 within normal limits, no S3, no S4, no clicks, no rubs, no murmurs ABD:  Flat, positive bowel sounds normal in frequency in pitch, no bruits, no rebound, no guarding, no midline pulsatile mass, no hepatomegaly, no splenomegaly EXT:  2 plus pulses throughout, no edema, no cyanosis no clubbing SKIN:  No rashes no nodules NEURO:  Cranial nerves II through XII grossly intact, motor grossly intact throughout PSYCH:  Cognitively intact, oriented to person place and time    EKG:  EKG is ordered today. The ekg ordered today demonstrates sinus tachycardia rate 114 bpm.     Recent Labs: 11/02/2016: ALT 10; BUN 7; Creat 0.62; Hemoglobin 12.8; Platelets 432; Potassium 4.0; Sodium 136    Lipid Panel No results found for: CHOL, TRIG, HDL, CHOLHDL, VLDL, LDLCALC, LDLDIRECT    Wt Readings from Last 3 Encounters:  11/17/16 (!) 138.3 kg (305 lb)  11/14/16 136.1 kg (300 lb)  11/02/16 (!) 144.7 kg (319 lb)      ASSESSMENT AND PLAN:  # Tachycardia: Shelby Robertson heart rate is  elevated at rest.  I suspect this might be related to chronic pain. She had a CBC and CMP 10/2016 that were unremarkable.  We will check a TSH and free T4.  PE Seems unlikely given her lack of shortness of breath and normal oxygenation. We will increase her metoprolol to 50 mg twice daily.   # Tobacco abuse: Shelby Robertson is ready to try and quit smoking.  We will start Chantix.  We discussed smoking cessation for 5 minutes.  # Hypertension:  Blood pressure well-controlled.  Continue amlodipine-olmesartan and metoprolol.    #  Morbid obesity:  Encouraged dietary changes and increased exercise.  # Hyperlipidemia: Continue Zetia.  Managed by PCP.  Current medicines are reviewed at length with the patient today.  The patient does not have concerns regarding medicines.  The following changes have been made:  Increase metoprolol to 50 mg twice daily.  Labs/ tests ordered today include:   Orders Placed This Encounter  Procedures  . T4, free  . TSH  . EKG 12-Lead     Disposition:   FU with Anett Ranker C. Oval Linsey, MD, [facility] in 1 month    This note was written with the assistance of speech recognition software.  Please excuse any transcriptional errors.  Signed, Kathrynne Kulinski C. Oval Linsey, MD, [facility]  11/17/2016 6:56 PM    [place]

## 2016-11-17 NOTE — Patient Instructions (Addendum)
Medication Instructions:  INCREASE YOU METOPROLOL TO 50 MG TWICE A DAY   START CHANTIX STARTER PACK AT 0.5 MG ONE DAILY FOR 3 DAYS THEN INCREASE TO ONE TWICE A DAY FOR 4 DAY THEN INCREASE TO 1 MG TWICE A DAY   Labwork: TSH/FT4 AT [facility] ON THE FIRST FLOOR  Testing/Procedures: NONE  Follow-Up: Your physician recommends that you schedule a follow-up appointment in: 1 MONTH OV  Any Other Special Instructions Will Be Listed Below (If Applicable).     If you need a refill on your cardiac medications before your next appointment, please call your pharmacy.

## 2016-11-18 DIAGNOSIS — M542 Cervicalgia: Secondary | ICD-10-CM | POA: Diagnosis not present

## 2016-11-18 DIAGNOSIS — M79671 Pain in right foot: Secondary | ICD-10-CM | POA: Diagnosis not present

## 2016-11-18 DIAGNOSIS — M79604 Pain in right leg: Secondary | ICD-10-CM | POA: Diagnosis not present

## 2016-11-18 DIAGNOSIS — G4733 Obstructive sleep apnea (adult) (pediatric): Secondary | ICD-10-CM | POA: Diagnosis not present

## 2016-11-18 DIAGNOSIS — M545 Low back pain: Secondary | ICD-10-CM | POA: Diagnosis not present

## 2016-11-18 DIAGNOSIS — G894 Chronic pain syndrome: Secondary | ICD-10-CM | POA: Diagnosis not present

## 2016-11-18 DIAGNOSIS — Z79891 Long term (current) use of opiate analgesic: Secondary | ICD-10-CM | POA: Diagnosis not present

## 2016-11-29 MED FILL — METHOTREXATE 25 MG/ML VIAL: 50 | 84 days supply | Qty: 12 | Fill #0

## 2016-11-29 MED FILL — METOPROLOL TARTRATE 50 MG T: 50 | 90 days supply | Qty: 180 | Fill #0

## 2016-11-29 MED FILL — CHANTIX STARTING MONTH BOX: 0.5 MG X 11 | 28 days supply | Qty: 53 | Fill #0

## 2016-12-14 ENCOUNTER — Telehealth: Payer: Self-pay | Admitting: Pharmacist

## 2016-12-14 NOTE — Telephone Encounter (Signed)
Received fax from [facility] regarding drug-drug interaction between methotrexate and meloxicam.  Patient is currently taking methotrexate 0.8 mL weekly.  She recently filled a prescription for meloxicam 15 mg tablets from another provider office.    I called patient to discuss the increased risk of adverse effects with meloxicam and methotrexate.  Patient's phone was not in service.  I will try back again later.     Elisabeth Most, Pharm.D., BCPS, CPP Clinical Pharmacist Pager: [phone] Phone: [phone] 12/14/2016 9:13 AM

## 2016-12-15 ENCOUNTER — Encounter: Payer: Self-pay | Admitting: *Deleted

## 2016-12-15 ENCOUNTER — Ambulatory Visit: Payer: Medicare Other | Admitting: Cardiovascular Disease

## 2016-12-15 NOTE — Progress Notes (Deleted)
Cardiology Office Note   Date:  12/15/2016   ID:  Shelby Robertson, DOB 12-05-1970, MRN [id number]  PCP:  Minette Brine  Cardiologist:   Skeet Latch, MD   No chief complaint on file.     History of Present Illness: Shelby Robertson is a 46 y.o. female with hypertension, hyperlipidemia, Rheumatoid arthritis, and fibromyalgia who presents for follow up.  Shelby Robertson saw Dr. Glendale Chard on 08/09/16 was noted to have a resting heart rate of 103. She noted that it is been elevated to 120 in the past. Sleep study was ordered and she was referred to cardiology for further evaluation. At that appointment was felt that her tachycardia was most likely due to chronic pain. Thyroid function was within normal limits. At her last appointment Shelby Robertson was started on Chantix for smoking cessation.       ?sleep study  Past Medical History:  Diagnosis Date  . Fibromyalgia   . Fibromyalgia   . Hypertension   . Osteoarthritis   . Rheumatoid arthritis(714.0)   . Rheumatoid arthritis(714.0)     No past surgical history on file.   Current Outpatient Prescriptions  Medication Sig Dispense Refill  . acetaminophen (TYLENOL) 325 MG tablet Take 650 mg by mouth every 6 (six) hours as needed for pain.    . Adalimumab (HUMIRA PEN) 40 MG/0.8ML PNKT Inject 1 pen into the skin every 14 (fourteen) days. 6 each 0  . amLODipine-olmesartan (AZOR) 10-40 MG tablet Take 1 tablet by mouth daily.    . butalbital-acetaminophen-caffeine (FIORICET, ESGIC) 50-325-40 MG tablet Take 1-2 tablets by mouth every 6 (six) hours as needed for headache. 10 tablet 0  . DULoxetine (CYMBALTA) 60 MG capsule Take 60 mg by mouth daily.      . folic acid (FOLVITE) 1 MG tablet Take 2 tablets (2 mg total) by mouth daily. 180 tablet 4  . furosemide (LASIX) 20 MG tablet Take 1 tablet by mouth daily.    [name] HYDROcodone-acetaminophen (NORCO) 10-325 MG per tablet Take 1 tablet by mouth every 6 (six) hours as needed for moderate  pain.     [name] ibuprofen (ADVIL,MOTRIN) 200 MG tablet Take 400 mg by mouth every 6 (six) hours as needed for pain.    . meloxicam (MOBIC) 15 MG tablet Take 15 mg by mouth daily.      . methotrexate 50 MG/2ML injection Inject 0.8 mL sub Q once a week 12 mL 0  . metoprolol tartrate (LOPRESSOR) 50 MG tablet Take 1 tablet (50 mg total) by mouth 2 (two) times daily. 180 tablet 1  . tiZANidine (ZANAFLEX) 4 MG tablet Take 4 mg by mouth every 6 (six) hours as needed (for muscle spasms).     . Tuberculin-Allergy Syringes 27G X 1/2" 1 ML MISC Inject 1 Syringe into the skin once a week. 12 each 3  . varenicline (CHANTIX CONTINUING MONTH PAK) 1 MG tablet Take 1 tablet (1 mg total) by mouth 2 (two) times daily. 60 tablet 1  . varenicline (CHANTIX STARTING MONTH PAK) 0.5 MG X 11 & 1 MG X 42 tablet Take one 0.5 mg tablet by mouth once daily for 3 days, then increase to one 0.5 mg tablet twice daily for 4 days, then increase to one 1 mg tablet twice daily. 53 tablet 0  . ZETIA 10 MG tablet Take 1 tablet by mouth daily.     No current facility-administered medications for this visit.     Allergies:   Patient has no  known allergies.    Social History:  The patient  reports that she has been smoking Cigarettes.  She has been smoking about 0.50 packs per day. She has never used smokeless tobacco. She reports that she does not drink alcohol or use drugs.   Family History:  The patient's family history includes Heart attack in her maternal grandmother; Stroke in her maternal grandmother.    ROS:  Please see the history of present illness.   Otherwise, review of systems are positive for none.   All other systems are reviewed and negative.    PHYSICAL EXAM: VS:  There were no vitals taken for this visit. , BMI There is no height or weight on file to calculate BMI. GENERAL:  Well appearing HEENT:  Pupils equal round and reactive, fundi not visualized, oral mucosa unremarkable NECK:  No jugular venous distention,  waveform within normal limits, carotid upstroke brisk and symmetric, no bruits, no thyromegaly LYMPHATICS:  No cervical adenopathy LUNGS:  Clear to auscultation bilaterally HEART:  RRR.  Tachycardic.  PMI not displaced or sustained,S1 and S2 within normal limits, no S3, no S4, no clicks, no rubs, no murmurs ABD:  Flat, positive bowel sounds normal in frequency in pitch, no bruits, no rebound, no guarding, no midline pulsatile mass, no hepatomegaly, no splenomegaly EXT:  2 plus pulses throughout, no edema, no cyanosis no clubbing SKIN:  No rashes no nodules NEURO:  Cranial nerves II through XII grossly intact, motor grossly intact throughout PSYCH:  Cognitively intact, oriented to person place and time    EKG:  EKG is ordered today. The ekg ordered today demonstrates sinus tachycardia rate 114 bpm.     Recent Labs: 11/02/2016: ALT 10; BUN 7; Creat 0.62; Hemoglobin 12.8; Platelets 432; Potassium 4.0; Sodium 136 11/17/2016: TSH 1.36    Lipid Panel No results found for: CHOL, TRIG, HDL, CHOLHDL, VLDL, LDLCALC, LDLDIRECT    Wt Readings from Last 3 Encounters:  11/17/16 (!) 138.3 kg (305 lb)  11/14/16 136.1 kg (300 lb)  11/02/16 (!) 144.7 kg (319 lb)      ASSESSMENT AND PLAN:  # Tachycardia: Shelby Robertson's heart rate is elevated at rest.  I suspect this might be related to chronic pain. She had a CBC and CMP 10/2016 that were unremarkable.  We will check a TSH and free T4.  PE Seems unlikely given her lack of shortness of breath and normal oxygenation. We will increase her metoprolol to 50 mg twice daily.   # Tobacco abuse: Shelby Robertson is ready to try and quit smoking.  We will start Chantix.  We discussed smoking cessation for 5 minutes.  # Hypertension:  Blood pressure well-controlled.  Continue amlodipine-olmesartan and metoprolol.    # Morbid obesity:  Encouraged dietary changes and increased exercise.  # Hyperlipidemia: Continue Zetia.  Managed by PCP.  Current medicines are  reviewed at length with the patient today.  The patient does not have concerns regarding medicines.  The following changes have been made:  Increase metoprolol to 50 mg twice daily.  Labs/ tests ordered today include:   No orders of the defined types were placed in this encounter.    Disposition:   FU with Hartleigh Edmonston C. Oval Linsey, MD, [facility] in 1 month    This note was written with the assistance of speech recognition software.  Please excuse any transcriptional errors.  Signed, Khalon Cansler C. Oval Linsey, MD, [facility]  12/15/2016 9:28 AM    [place]

## 2016-12-15 NOTE — Telephone Encounter (Signed)
Second attempt to reach patient.  Her phone was not in service.

## 2016-12-16 DIAGNOSIS — Z79891 Long term (current) use of opiate analgesic: Secondary | ICD-10-CM | POA: Diagnosis not present

## 2016-12-16 DIAGNOSIS — M25572 Pain in left ankle and joints of left foot: Secondary | ICD-10-CM | POA: Diagnosis not present

## 2016-12-16 DIAGNOSIS — G4733 Obstructive sleep apnea (adult) (pediatric): Secondary | ICD-10-CM | POA: Diagnosis not present

## 2016-12-16 DIAGNOSIS — M25561 Pain in right knee: Secondary | ICD-10-CM | POA: Diagnosis not present

## 2016-12-16 DIAGNOSIS — M79662 Pain in left lower leg: Secondary | ICD-10-CM | POA: Diagnosis not present

## 2016-12-16 DIAGNOSIS — M79672 Pain in left foot: Secondary | ICD-10-CM | POA: Diagnosis not present

## 2016-12-16 DIAGNOSIS — G894 Chronic pain syndrome: Secondary | ICD-10-CM | POA: Diagnosis not present

## 2016-12-17 ENCOUNTER — Encounter: Payer: Self-pay | Admitting: Pharmacist

## 2016-12-17 NOTE — Telephone Encounter (Signed)
Third attempt to reach patient.  Her phone was still not in service.  Will send a letter to patient.

## 2017-01-05 DIAGNOSIS — M069 Rheumatoid arthritis, unspecified: Secondary | ICD-10-CM | POA: Diagnosis not present

## 2017-01-05 DIAGNOSIS — R7309 Other abnormal glucose: Secondary | ICD-10-CM | POA: Diagnosis not present

## 2017-01-05 DIAGNOSIS — E781 Pure hyperglyceridemia: Secondary | ICD-10-CM | POA: Diagnosis not present

## 2017-01-05 DIAGNOSIS — Z0000 Encounter for general adult medical examination without abnormal findings: Secondary | ICD-10-CM | POA: Diagnosis not present

## 2017-01-05 DIAGNOSIS — M797 Fibromyalgia: Secondary | ICD-10-CM | POA: Diagnosis not present

## 2017-01-05 DIAGNOSIS — Z124 Encounter for screening for malignant neoplasm of cervix: Secondary | ICD-10-CM | POA: Diagnosis not present

## 2017-01-05 DIAGNOSIS — Z113 Encounter for screening for infections with a predominantly sexual mode of transmission: Secondary | ICD-10-CM | POA: Diagnosis not present

## 2017-01-05 DIAGNOSIS — I10 Essential (primary) hypertension: Secondary | ICD-10-CM | POA: Diagnosis not present

## 2017-01-12 ENCOUNTER — Ambulatory Visit
Admission: RE | Admit: 2017-01-12 | Discharge: 2017-01-12 | Disposition: A | Discharge status: Home / Self Care | Payer: Medicare Other | Source: Ambulatory Visit | Attending: Nurse Practitioner | Admitting: Nurse Practitioner

## 2017-01-12 DIAGNOSIS — N6489 Other specified disorders of breast: Secondary | ICD-10-CM | POA: Diagnosis not present

## 2017-01-12 DIAGNOSIS — R928 Other abnormal and inconclusive findings on diagnostic imaging of breast: Secondary | ICD-10-CM

## 2017-01-13 ENCOUNTER — Telehealth: Payer: Self-pay

## 2017-01-13 DIAGNOSIS — G4733 Obstructive sleep apnea (adult) (pediatric): Secondary | ICD-10-CM | POA: Diagnosis not present

## 2017-01-13 DIAGNOSIS — G894 Chronic pain syndrome: Secondary | ICD-10-CM | POA: Diagnosis not present

## 2017-01-13 DIAGNOSIS — M79662 Pain in left lower leg: Secondary | ICD-10-CM | POA: Diagnosis not present

## 2017-01-13 DIAGNOSIS — M25572 Pain in left ankle and joints of left foot: Secondary | ICD-10-CM | POA: Diagnosis not present

## 2017-01-13 DIAGNOSIS — M542 Cervicalgia: Secondary | ICD-10-CM | POA: Diagnosis not present

## 2017-01-13 NOTE — Telephone Encounter (Signed)
Noted patient's last refill of Humira was on 10/28/16 for a 90 day supply at [facility]. She is out of refills.  I called patient with a refill reminder call. I could not leave a message on patient's home phone and her cell phone was not in service. Will try again.   Zohair Epp, [place], CPhT 9:58 AM

## 2017-01-24 ENCOUNTER — Ambulatory Visit: Payer: Medicare Other | Admitting: Rheumatology

## 2017-02-02 NOTE — Telephone Encounter (Signed)
Second attempt to call patient with a refill reminder. Her cell phone is still out of order and I did not get an answer or was able to leave a message on her home phone. Will try again.

## 2017-02-04 NOTE — Telephone Encounter (Signed)
Third attempt to call patient with a refill reminder. Still no answer on her home phone and her cell phone was out of service.   Burnette Valenti, [place], CPhT 10:42 AM

## 2017-02-07 ENCOUNTER — Ambulatory Visit (HOSPITAL_COMMUNITY)
Admission: EM | Admit: 2017-02-07 | Discharge: 2017-02-07 | Disposition: A | Discharge status: Home / Self Care | Payer: Medicare Other | Attending: Emergency Medicine | Admitting: Emergency Medicine

## 2017-02-07 ENCOUNTER — Encounter (HOSPITAL_COMMUNITY): Payer: Self-pay | Admitting: Emergency Medicine

## 2017-02-07 DIAGNOSIS — L02411 Cutaneous abscess of right axilla: Secondary | ICD-10-CM

## 2017-02-07 DIAGNOSIS — L739 Follicular disorder, unspecified: Secondary | ICD-10-CM

## 2017-02-07 DIAGNOSIS — L02412 Cutaneous abscess of left axilla: Secondary | ICD-10-CM | POA: Diagnosis not present

## 2017-02-07 MED ORDER — DOXYCYCLINE HYCLATE 100 MG PO CAPS: 100.0000 mg | ORAL_CAPSULE | Freq: Two times a day (BID) | ORAL | 0 refills | Status: AC

## 2017-02-07 MED ORDER — CHLORHEXIDINE GLUCONATE 4 % EX LIQD: Freq: Every day | CUTANEOUS | 0 refills | Status: DC | PRN

## 2017-02-07 NOTE — ED Provider Notes (Signed)
HPI  SUBJECTIVE:  Shelby Robertson is a 46 y.o. female who presents with bilateral axillary infections. States that she had painful mass of gradually increasing size in her left axilla for the past 2 weeks, started draining several days ago and she reports a decrease in the size and pain. She states that of the right one started draining 3 days ago with a decrease in size and pain. She's been doing warm compresses and cleaning herself with soap and water daily. She is also been taking Tylenol and ibuprofen which seemed to help her symptoms. Symptoms are worse with ambient heat. She states that she does not want an I&D. She denies shaving or trauma to the area. No fevers, bodyaches. She did take an antipyretic within 6-8 hours of evaluation. 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 She reports lots of scarring from this. She denies a diagnosis of hiradenitis suppurativa. She has a past medical history of hypertension, fibromyalgia, rheumatoid arthritis for which takes Humira. No history of MRSA, diabetes. LMP: 2 months ago. Denies possibility of being pregnant states we do not need to check. PMD: Minette Brine    Past Medical History:  Diagnosis Date  . Fibromyalgia   . Fibromyalgia   . Hypertension   . Osteoarthritis   . Rheumatoid arthritis(714.0)   . Rheumatoid arthritis(714.0)     History reviewed. No pertinent surgical history.  Family History  Problem Relation Age of Onset  . Heart attack Maternal Grandmother   . Stroke Maternal Grandmother     Social History  Substance Use Topics  . Smoking status: Current Every Day Smoker    Packs/day: 0.50    Types: Cigarettes  . Smokeless tobacco: Never Used  . Alcohol use No    No current facility-administered medications for this encounter.   Current Outpatient Prescriptions:  .  acetaminophen (TYLENOL) 325 MG tablet, Take 650 mg by mouth  every 6 (six) hours as needed for pain., Disp: , Rfl:  .  Adalimumab (HUMIRA PEN) 40 MG/0.8ML PNKT, Inject 1 pen into the skin every 14 (fourteen) days., Disp: 6 each, Rfl: 0 .  amLODipine-olmesartan (AZOR) 10-40 MG tablet, Take 1 tablet by mouth daily., Disp: , Rfl:  .  butalbital-acetaminophen-caffeine (FIORICET, ESGIC) 50-325-40 MG tablet, Take 1-2 tablets by mouth every 6 (six) hours as needed for headache., Disp: 10 tablet, Rfl: 0 .  chlorhexidine (HIBICLENS) 4 % external liquid, Apply topically daily as needed. Dilute 10-15 mL in water, Use daily when bathing for 1-2 weeks, Disp: 120 mL, Rfl: 0 .  doxycycline (VIBRAMYCIN) 100 MG capsule, Take 1 capsule (100 mg total) by mouth 2 (two) times daily., Disp: 20 capsule, Rfl: 0 .  DULoxetine (CYMBALTA) 60 MG capsule, Take 60 mg by mouth daily.  , Disp: , Rfl:  .  folic acid (FOLVITE) 1 MG tablet, Take 2 tablets (2 mg total) by mouth daily., Disp: 180 tablet, Rfl: 4 .  furosemide (LASIX) 20 MG tablet, Take 1 tablet by mouth daily., Disp: , Rfl:  .  HYDROcodone-acetaminophen (NORCO) 10-325 MG per tablet, Take 1 tablet by mouth every 6 (six) hours as needed for moderate pain. , Disp: , Rfl:  .  ibuprofen (ADVIL,MOTRIN) 200 MG tablet, Take 400 mg by mouth every 6 (six) hours as needed for pain., Disp: , Rfl:  .  meloxicam (MOBIC) 15 MG tablet, Take 15 mg by mouth daily.  , Disp: , Rfl:  .  methotrexate 50 MG/2ML injection, Inject 0.8 mL sub Q once a week, Disp: 12 mL, Rfl: 0 .  metoprolol tartrate (LOPRESSOR) 50 MG tablet, Take 1 tablet (50 mg total) by mouth 2 (two) times daily., Disp: 180 tablet, Rfl: 1 .  tiZANidine (ZANAFLEX) 4 MG tablet, Take 4 mg by mouth every 6 (six) hours as needed (for muscle spasms). , Disp: , Rfl:  .  Tuberculin-Allergy Syringes 27G X 1/2" 1 ML MISC, Inject 1 Syringe into the skin once a week., Disp: 12 each, Rfl: 3 .  varenicline (CHANTIX CONTINUING MONTH PAK) 1 MG tablet, Take 1 tablet (1 mg total) by mouth 2 (two) times  daily., Disp: 60 tablet, Rfl: 1 .  varenicline (CHANTIX STARTING MONTH PAK) 0.5 MG X 11 & 1 MG X 42 tablet, Take one 0.5 mg tablet by mouth once daily for 3 days, then increase to one 0.5 mg tablet twice daily for 4 days, then increase to one 1 mg tablet twice daily., Disp: 53 tablet, Rfl: 0 .  ZETIA 10 MG tablet, Take 1 tablet by mouth daily., Disp: , Rfl:   No Known Allergies   ROS  As noted in HPI.   Physical Exam  BP 126/76 (BP Location: Right Wrist)   Pulse (!) 59   Temp 97.9 F (36.6 C) (Oral)   Resp 20   SpO2 96%   Constitutional: Well developed, well nourished, no acute distress Eyes:  EOMI, conjunctiva normal bilaterally HENT: Normocephalic, atraumatic,mucus membranes moist Respiratory: Normal inspiratory effort Cardiovascular: Normal rate GI: nondistended skin: Extensive scarring and raised hyperpigmented tender lesions under bilateral axilla which patient states is not new and is resultant from folliculitis. Positive expressible purulent drainage from the most tender areas bilaterally. Musculoskeletal: no deformities Neurologic: Alert & oriented x 3, no focal neuro deficits Psychiatric: Speech and behavior appropriate   ED Course   Medications - No data to display  No orders of the defined types were placed in this encounter.   No results found for this or any previous visit (from the past 24 hour(s)). No results found.  ED Clinical Impression  Abscess of left axilla  Abscess of axilla, right  Folliculitis   ED Assessment/Plan  [facility] narcotic database reviewed. Patient has been ongoing Norco 10/325 mg prescription from 3 providers over the past 6 months. Last prescription was filled 4/6 for # 120.  Patient does not want an I&D. Presentation consistent with a folliculitis versus hidradenitis suppurativa. Since it is draining already, feel that she can continue warm compresses, will send home with Hibiclens, Tylenol and ibuprofen or the Norco  as needed for pain, will send her home on doxycycline for 10 days because she is immunocompromised. she will follow-up with her dermatologist   Discussed MDM, plan and followup with patient. Discussed sn/sx that should prompt return to the  ED. Patient agrees with plan.   Meds ordered this encounter  Medications  . doxycycline (VIBRAMYCIN) 100 MG capsule    Sig: Take 1 capsule (100 mg total) by mouth 2 (two) times daily.    Dispense:  20 capsule    Refill:  0  . chlorhexidine (HIBICLENS) 4 % external liquid    Sig: Apply topically daily as needed. Dilute 10-15 mL in water, Use daily when bathing for 1-2 weeks    Dispense:  120 mL    Refill:  0    *This clinic note was created using [employer]. Therefore, there may be occasional mistakes despite careful proofreading.  ?  Melynda Ripple, MD 02/07/17 2010

## 2017-02-07 NOTE — Discharge Instructions (Signed)
Continue warm compresses. 600 mg of ibuprofen with 1 g of Tylenol or 600 mg of ibuprofen Norco as needed for pain.

## 2017-02-07 NOTE — ED Triage Notes (Signed)
The patient presented to the [facility] with a complaint of a possible abscess under her right arm.

## 2017-02-14 ENCOUNTER — Other Ambulatory Visit: Payer: Self-pay | Admitting: Rheumatology

## 2017-02-14 NOTE — Telephone Encounter (Signed)
Patients labs were due mid April  I have called her to ask about her lab plan   She is still on ABX, from an absces (doxy) want to make sure she did d/c the Humira and MTX while on the Doxy.  Left message for her to call back.

## 2017-02-14 NOTE — Telephone Encounter (Signed)
Noted patient's last refill of HUMIRA was on 10/28/16  at [facility] for a 90 day supply.  I called patient with a refill reminder call.  Patient confirms she does need a refill at this time for Humira as well as MTX and Folic Acid.  Submitted an electronic refill request through [employer] to the clinic. Patient has refills on folic acids. Will queue the rx to be filled along with Humira and MTX.   Dennisha Mouser, [place], CPhT 12:38 PM

## 2017-02-16 ENCOUNTER — Encounter (HOSPITAL_BASED_OUTPATIENT_CLINIC_OR_DEPARTMENT_OTHER): Payer: Self-pay

## 2017-02-16 ENCOUNTER — Other Ambulatory Visit: Payer: Self-pay

## 2017-02-16 DIAGNOSIS — M79672 Pain in left foot: Secondary | ICD-10-CM | POA: Diagnosis not present

## 2017-02-16 DIAGNOSIS — M25572 Pain in left ankle and joints of left foot: Secondary | ICD-10-CM | POA: Diagnosis not present

## 2017-02-16 DIAGNOSIS — Z79899 Other long term (current) drug therapy: Secondary | ICD-10-CM

## 2017-02-16 DIAGNOSIS — G894 Chronic pain syndrome: Secondary | ICD-10-CM | POA: Diagnosis not present

## 2017-02-16 DIAGNOSIS — M25561 Pain in right knee: Secondary | ICD-10-CM | POA: Diagnosis not present

## 2017-02-16 DIAGNOSIS — G4733 Obstructive sleep apnea (adult) (pediatric): Secondary | ICD-10-CM

## 2017-02-16 DIAGNOSIS — M79662 Pain in left lower leg: Secondary | ICD-10-CM | POA: Diagnosis not present

## 2017-02-16 DIAGNOSIS — Z79891 Long term (current) use of opiate analgesic: Secondary | ICD-10-CM | POA: Diagnosis not present

## 2017-02-16 LAB — CBC WITH DIFFERENTIAL/PLATELET
BASOS PCT: 0 %
Basophils Absolute: 0 cells/uL (ref 0–200)
Eosinophils Absolute: 100 cells/uL (ref 15–500)
Eosinophils Relative: 1 %
HEMATOCRIT: 36.5 % (ref 35.0–45.0)
Hemoglobin: 12.1 g/dL (ref 11.7–15.5)
LYMPHS ABS: 3400 {cells}/uL (ref 850–3900)
Lymphocytes Relative: 34 %
MCH: 29.5 pg (ref 27.0–33.0)
MCHC: 33.2 g/dL (ref 32.0–36.0)
MCV: 89 fL (ref 80.0–100.0)
MONO ABS: 600 {cells}/uL (ref 200–950)
MPV: 8.6 fL (ref 7.5–12.5)
Monocytes Relative: 6 %
NEUTROS PCT: 59 %
Neutro Abs: 5900 cells/uL (ref 1500–7800)
Platelets: 425 10*3/uL — ABNORMAL HIGH (ref 140–400)
RBC: 4.1 MIL/uL (ref 3.80–5.10)
RDW: 15.6 % — ABNORMAL HIGH (ref 11.0–15.0)
WBC: 10 10*3/uL (ref 3.8–10.8)

## 2017-02-16 NOTE — Telephone Encounter (Signed)
Patient stopped the Humira and MTX while on her Doxy.  Patient updated labs today.   Last Visit: 11/02/16 Next Visit: 04/04/17 Labs: 11/02/16 elevated  Tb Gold: 03/2016 Neg  Okay to refill Humira and MTX?

## 2017-02-17 LAB — COMPLETE METABOLIC PANEL WITH GFR
ALBUMIN: 3.4 g/dL — AB (ref 3.6–5.1)
ALT: 13 U/L (ref 6–29)
AST: 13 U/L (ref 10–30)
Alkaline Phosphatase: 84 U/L (ref 33–115)
BUN: 9 mg/dL (ref 7–25)
CALCIUM: 8.9 mg/dL (ref 8.6–10.2)
CO2: 24 mmol/L (ref 20–31)
CREATININE: 0.59 mg/dL (ref 0.50–1.10)
Chloride: 104 mmol/L (ref 98–110)
GFR, Est African American: >89 mL/min (ref >=60)
GFR, Est Non African American: >89 mL/min (ref >=60)
Glucose, Bld: 111 mg/dL — ABNORMAL HIGH (ref 65–99)
Potassium: 3.4 mmol/L — ABNORMAL LOW (ref 3.5–5.3)
Sodium: 140 mmol/L (ref 135–146)
Total Bilirubin: 0.3 mg/dL (ref 0.2–1.2)
Total Protein: 7.7 g/dL (ref 6.1–8.1)

## 2017-02-18 MED FILL — FOLIC ACID 1 MG TABLET: 1 | 90 days supply | Qty: 180 | Fill #1

## 2017-02-28 MED FILL — METHOTREXATE 25 MG/ML VIAL: 50 | 84 days supply | Qty: 12 | Fill #0

## 2017-02-28 MED FILL — HUMIRA PEN 40 MG/0.8ML PNKT: 40 | 84 days supply | Qty: 6 | Fill #0

## 2017-03-01 MED FILL — METOPROLOL TARTRATE 50 MG T: 50 | 90 days supply | Qty: 180 | Fill #1

## 2017-03-04 ENCOUNTER — Telehealth: Payer: Self-pay | Admitting: Pharmacist

## 2017-03-04 DIAGNOSIS — H1045 Other chronic allergic conjunctivitis: Secondary | ICD-10-CM | POA: Diagnosis not present

## 2017-03-04 DIAGNOSIS — Z79899 Other long term (current) drug therapy: Secondary | ICD-10-CM | POA: Diagnosis not present

## 2017-03-04 NOTE — Telephone Encounter (Signed)
I spoke to patient regarding her Humira and methotrexate.  Patient reports adherence with Humira 40 mg every other week, methotrexate 0.8 mL, and folic acid 2 mg daily.  She confirms she held her medications while on antibiotics.  She reports she has completed antibiotic course and infection has resolved.  Discussed use of NSAIDs and methotrexate.  She reports she is no longer taking any NSAIDs (ibuprofen or meloxicam).  Patient denies any questions or concerns regarding her medications at this time.   Elisabeth Most, Pharm.D., BCPS, CPP Clinical Pharmacist Pager: [phone] Phone: [phone] 03/04/2017 11:30 AM

## 2017-03-09 ENCOUNTER — Telehealth (INDEPENDENT_AMBULATORY_CARE_PROVIDER_SITE_OTHER): Payer: Self-pay

## 2017-03-09 NOTE — Telephone Encounter (Signed)
-----   Message from Meriden May, RT sent at 03/04/2017  4:26 PM EDT ----- Regarding: Hyalgan Will you please call patient and tell her that her Medicare ins will cover 80% of the costs of injections, and her secondary Medicaid will not cover anything.  She will owe up to approx $350 OOP.  See if she wants to proceed?  Offer to mail her an application from the patient assistance foundation, if her income is at a certain amount, they will pay for the injections for her for free.  Thanks- I have the form if she wants it to be mailed to her.  She just has to fill out 2 pages (income info etc) Thanks.   ----- Message ----- From: Shona Needles, RT Sent: 02/23/2017   5:42 PM To: Wendy May, RT Subject: FW: Prior authorization of Hyalgan bilateral#  new ----- Message ----- From: Eliezer Lofts, PA-C Sent: 11/02/2016   4:00 PM To: Shona Needles, RT Subject: Prior authorization of Hyalgan bilateral kne#  Please apply for Hyalgan bilateral knees 5  X-ray proven of osteoarthritis of bilateral knees with left knee being more severe but right knee is having more pain  Failure weight loss  Failure of and states  Cortisone injection given today

## 2017-03-09 NOTE — Telephone Encounter (Signed)
Called and left voicemail advising patient to return call concerning Hyalgan injections.

## 2017-03-18 DIAGNOSIS — M25562 Pain in left knee: Secondary | ICD-10-CM | POA: Diagnosis not present

## 2017-03-18 DIAGNOSIS — M79671 Pain in right foot: Secondary | ICD-10-CM | POA: Diagnosis not present

## 2017-03-18 DIAGNOSIS — M25561 Pain in right knee: Secondary | ICD-10-CM | POA: Diagnosis not present

## 2017-03-18 DIAGNOSIS — M79672 Pain in left foot: Secondary | ICD-10-CM | POA: Diagnosis not present

## 2017-03-18 DIAGNOSIS — M79605 Pain in left leg: Secondary | ICD-10-CM | POA: Diagnosis not present

## 2017-03-18 DIAGNOSIS — M5417 Radiculopathy, lumbosacral region: Secondary | ICD-10-CM | POA: Diagnosis not present

## 2017-03-18 DIAGNOSIS — G894 Chronic pain syndrome: Secondary | ICD-10-CM | POA: Diagnosis not present

## 2017-03-18 DIAGNOSIS — M79604 Pain in right leg: Secondary | ICD-10-CM | POA: Diagnosis not present

## 2017-04-01 NOTE — Progress Notes (Signed)
Office Visit Note  Patient: Shelby Robertson             Date of Birth: 03-01-1971           MRN: [id number]             PCP: Minette Brine Referring: Minette Brine, FNP Visit Date: 04/04/2017 Occupation: _0 @    Subjective:  Medication Management   History of Present Illness: Shelby Robertson is a 46 y.o. female  Last seen 11/02/2016 for rheumatoid arthritis, erosive disease.  On Humira every 2 weeks And methotrexate 0.8 ML's per week Folic acid 2 mg per day Adequate response  Patient also has history of fibromyalgia.  Today, pt is doing well RA but c/o of both knee pain (right worse than left). Right knee feels like it doesn't want to bend per patient   Adequate response with Humira every 2 weeks and methotrexate 0.8 ML every week and folic acid 2 mg daily  Activities of Daily Living:  Patient reports morning stiffness for 15 minutes.   Patient Reports nocturnal pain.  Difficulty dressing/grooming: Denies Difficulty climbing stairs: Reports Difficulty getting out of chair: Reports Difficulty using hands for taps, buttons, cutlery, and/or writing: Denies   Review of Systems  Constitutional: Positive for fatigue.  HENT: Negative for mouth sores and mouth dryness.   Eyes: Negative for dryness.  Respiratory: Negative for shortness of breath.   Gastrointestinal: Negative for constipation and diarrhea.  Musculoskeletal: Positive for myalgias and myalgias.  Skin: Negative for sensitivity to sunlight.  Psychiatric/Behavioral: Positive for sleep disturbance. Negative for decreased concentration.    PMFS History:  Patient Active Problem List   Diagnosis Date Noted  . High risk medication use 08/21/2016  . Rheumatoid arthritis of multiple sites without rheumatoid factor ([place]) 08/12/2016  . ARTHRITIS, GENERALIZED 07/14/2010  . UTI 10/27/2009  . FURUNCULOSIS 08/19/2009  . HYPERTENSION, BENIGN ESSENTIAL 03/04/2009  . POLYURIA 03/04/2009  . FIBROMYALGIA 11/09/2007    . HEADACHE 11/09/2007  . HIDRADENITIS SUPPURATIVA 07/11/2007  . DYSLIPIDEMIA 06/28/2007  . LEG EDEMA, BILATERAL 05/05/2007    Past Medical History:  Diagnosis Date  . Fibromyalgia   . Fibromyalgia   . Hypertension   . Osteoarthritis   . Rheumatoid arthritis(714.0)   . Rheumatoid arthritis(714.0)     Family History  Problem Relation Age of Onset  . Heart attack Maternal Grandmother   . Stroke Maternal Grandmother    History reviewed. No pertinent surgical history. Social History   Social History Narrative  . No narrative on file     Objective: Vital Signs: BP 140/78   Pulse 88   Ht _1  (1.676 m)   Wt (!) 307 lb (139.3 kg)   BMI 49.55 kg/m    Physical Exam  Constitutional: She is oriented to person, place, and time. She appears well-developed and well-nourished.  HENT:  Head: Normocephalic and atraumatic.  Eyes: EOM are normal. Pupils are equal, round, and reactive to light.  Cardiovascular: Normal rate, regular rhythm and normal heart sounds.  Exam reveals no gallop and no friction rub.   No murmur heard. Pulmonary/Chest: Effort normal and breath sounds normal. She has no wheezes. She has no rales.  Abdominal: Soft. Bowel sounds are normal. She exhibits no distension. There is no tenderness. There is no guarding. No hernia.  Musculoskeletal: Normal range of motion. She exhibits no edema, tenderness or deformity.  Lymphadenopathy:    She has no cervical adenopathy.  Neurological: She is alert and oriented to person, place,  and time. Coordination normal.  Skin: Skin is warm and dry. Capillary refill takes less than 2 seconds. No rash noted.  Psychiatric: She has a normal mood and affect. Her behavior is normal.  Nursing note and vitals reviewed.    Musculoskeletal Exam:  Full range of motion of all joints Grip strength is equal and strong bilaterally Fiber myalgia tender points are 0 out of 18 positive (patient does have fiber but she is having a good day  today). Her main problem is that both of her knees hurt (right worse the left).  CDAI Exam: CDAI Homunculus Exam:   Joint Counts:  CDAI Tender Joint count: 0 CDAI Swollen Joint count: 0  Global Assessments:  Patient Global Assessment: 3 Provider Global Assessment: 3  CDAI Calculated Score: 6  Patient's main complaint today is knee pain. Her rheumatoid arthritis was well controlled.  No synovitis on exam  Investigation: No additional findings. Orders Only on 02/16/2017  Component Date Value Ref Range Status  . WBC 02/16/2017 10.0  3.8 - 10.8 K/uL Final  . RBC 02/16/2017 4.10  3.80 - 5.10 MIL/uL Final  . Hemoglobin 02/16/2017 12.1  11.7 - 15.5 g/dL Final  . HCT 02/16/2017 36.5  35.0 - 45.0 % Final  . MCV 02/16/2017 89.0  80.0 - 100.0 fL Final  . MCH 02/16/2017 29.5  27.0 - 33.0 pg Final  . MCHC 02/16/2017 33.2  32.0 - 36.0 g/dL Final  . RDW 02/16/2017 15.6* 11.0 - 15.0 % Final  . Platelets 02/16/2017 425* 140 - 400 K/uL Final  . MPV 02/16/2017 8.6  7.5 - 12.5 fL Final  . Neutro Abs 02/16/2017 5900  1,500 - 7,800 cells/uL Final  . Lymphs Abs 02/16/2017 3400  850 - 3,900 cells/uL Final  . Monocytes Absolute 02/16/2017 600  200 - 950 cells/uL Final  . Eosinophils Absolute 02/16/2017 100  15 - 500 cells/uL Final  . Basophils Absolute 02/16/2017 0  0 - 200 cells/uL Final  . Neutrophils Relative % 02/16/2017 59  % Final  . Lymphocytes Relative 02/16/2017 34  % Final  . Monocytes Relative 02/16/2017 6  % Final  . Eosinophils Relative 02/16/2017 1  % Final  . Basophils Relative 02/16/2017 0  % Final  . Smear Review 02/16/2017 Criteria for review not met   Final  . Sodium 02/16/2017 140  135 - 146 mmol/L Final  . Potassium 02/16/2017 3.4* 3.5 - 5.3 mmol/L Final  . Chloride 02/16/2017 104  98 - 110 mmol/L Final  . CO2 02/16/2017 24  20 - 31 mmol/L Final  . Glucose, Bld 02/16/2017 111* 65 - 99 mg/dL Final  . BUN 02/16/2017 9  7 - 25 mg/dL Final  . Creat 02/16/2017 0.59  0.50 -  1.10 mg/dL Final  . Total Bilirubin 02/16/2017 0.3  0.2 - 1.2 mg/dL Final  . Alkaline Phosphatase 02/16/2017 84  33 - 115 U/L Final  . AST 02/16/2017 13  10 - 30 U/L Final  . ALT 02/16/2017 13  6 - 29 U/L Final  . Total Protein 02/16/2017 7.7  6.1 - 8.1 g/dL Final  . Albumin 02/16/2017 3.4* 3.6 - 5.1 g/dL Final  . Calcium 02/16/2017 8.9  8.6 - 10.2 mg/dL Final  . GFR, Est African American 02/16/2017 >89  >=60 mL/min Final  . GFR, Est Non African American 02/16/2017 >89  >=60 mL/min Final  Office Visit on 11/17/2016  Component Date Value Ref Range Status  . Free T4 11/17/2016 1.0  0.8 - 1.8  ng/dL Final  . TSH 11/17/2016 1.36  mIU/L Final   Comment:   Reference Range   > or = 20 Years  0.40-4.50   Pregnancy Range First trimester  0.26-2.66 Second trimester 0.55-2.73 Third trimester  0.43-2.91     Office Visit on 11/02/2016  Component Date Value Ref Range Status  . WBC 11/02/2016 10.5  3.8 - 10.8 K/uL Final  . RBC 11/02/2016 4.40  3.80 - 5.10 MIL/uL Final  . Hemoglobin 11/02/2016 12.8  11.7 - 15.5 g/dL Final  . HCT 11/02/2016 38.4  35.0 - 45.0 % Final  . MCV 11/02/2016 87.3  80.0 - 100.0 fL Final  . MCH 11/02/2016 29.1  27.0 - 33.0 pg Final  . MCHC 11/02/2016 33.3  32.0 - 36.0 g/dL Final  . RDW 11/02/2016 14.9  11.0 - 15.0 % Final  . Platelets 11/02/2016 432* 140 - 400 K/uL Final  . MPV 11/02/2016 8.8  7.5 - 12.5 fL Final  . Neutro Abs 11/02/2016 6300  1,500 - 7,800 cells/uL Final  . Lymphs Abs 11/02/2016 3570  850 - 3,900 cells/uL Final  . Monocytes Absolute 11/02/2016 525  200 - 950 cells/uL Final  . Eosinophils Absolute 11/02/2016 105  15 - 500 cells/uL Final  . Basophils Absolute 11/02/2016 0  0 - 200 cells/uL Final  . Neutrophils Relative % 11/02/2016 60  % Final  . Lymphocytes Relative 11/02/2016 34  % Final  . Monocytes Relative 11/02/2016 5  % Final  . Eosinophils Relative 11/02/2016 1  % Final  . Basophils Relative 11/02/2016 0  % Final  . Smear Review 11/02/2016  Criteria for review not met   Final  . Sodium 11/02/2016 136  135 - 146 mmol/L Final  . Potassium 11/02/2016 4.0  3.5 - 5.3 mmol/L Final  . Chloride 11/02/2016 103  98 - 110 mmol/L Final  . CO2 11/02/2016 23  20 - 31 mmol/L Final  . Glucose, Bld 11/02/2016 106* 65 - 99 mg/dL Final  . BUN 11/02/2016 7  7 - 25 mg/dL Final  . Creat 11/02/2016 0.62  0.50 - 1.10 mg/dL Final  . Total Bilirubin 11/02/2016 0.3  0.2 - 1.2 mg/dL Final  . Alkaline Phosphatase 11/02/2016 79  33 - 115 U/L Final  . AST 11/02/2016 12  10 - 30 U/L Final  . ALT 11/02/2016 10  6 - 29 U/L Final  . Total Protein 11/02/2016 7.9  6.1 - 8.1 g/dL Final  . Albumin 11/02/2016 3.9  3.6 - 5.1 g/dL Final  . Calcium 11/02/2016 9.4  8.6 - 10.2 mg/dL Final  . GFR, Est African American 11/02/2016 >89  >=60 mL/min Final  . GFR, Est Non African American 11/02/2016 >89  >=60 mL/min Final     Imaging: No results found.  Speciality Comments: No specialty comments available.    Procedures:  No procedures performed Allergies: Patient has no known allergies.   Assessment / Plan:     Visit Diagnoses: High risk medications (not anticoagulants) long-term use - Plan: Quantiferon tb gold assay (blood)  High risk medication use - HUMIRA  EVERY 2 WEEKS;MTX 9.4BS EVERY WEEK;FOLIC ACID '2MG'$  QD;TB GOLD NEGATIVE JUNE 2017 @ [place]: CBC with Differential/Platelet, COMPLETE METABOLIC PANEL WITH GFR    Plan: #1: Rheumatoid arthritis No joint pain, swelling, stiffness. History of mild ulnar deviation of the left hand at the MCP joints.   #2: High-risk prescription On Humira every 2 weeks Methotrexate 0.8 ML's per week Folic acid 2 mg per day  Labs done May 2018 is within normal limits that include CBC with differential and CMP with GFR TB gold is ordered today (last TB test done 03/23/2016 and the report was scanned in September 2017) Also CBC with differential and CMP with GFR   #3: Fibromyalgia syndrome; history of active  disease with generalized pain and positive tender points but today, she is doing really well with her fibromyalgia. No fibromyalgia tender points.  #4: Bilateral knee pain with right worse than left Patient states right knee cannot be been told the way it feels like something is in there. On exam: No evidence of fluid on examination. Therefore we will give the patient Voltaren gel.  #5: No med refills needed at this time. Both the Humira and methotrexate were recently filled May 2018. She does need updated labs which will be done today that include CBC with differential, CMP with GFR, TB gold.  #6: Return to clinic in 5 months   Orders: Orders Placed This Encounter  Procedures  . Quantiferon tb gold assay (blood)   No orders of the defined types were placed in this encounter.   Face-to-face time spent with patient was 30 minutes. 50% of time was spent in counseling and coordination of care.  Follow-Up Instructions: No Follow-up on file.   Eliezer Lofts, PA-C  Note - This record has been created using [employer].  Chart creation errors have been sought, but may not always  have been located. Such creation errors do not reflect on  the standard of medical care.

## 2017-04-04 ENCOUNTER — Ambulatory Visit (INDEPENDENT_AMBULATORY_CARE_PROVIDER_SITE_OTHER): Payer: Medicare Other | Admitting: Rheumatology

## 2017-04-04 ENCOUNTER — Encounter: Payer: Self-pay | Admitting: Rheumatology

## 2017-04-04 VITALS — BP 140/78 | HR 88 | Ht 66.0 in | Wt 307.0 lb

## 2017-04-04 DIAGNOSIS — Z79899 Other long term (current) drug therapy: Secondary | ICD-10-CM | POA: Diagnosis not present

## 2017-04-04 LAB — CBC WITH DIFFERENTIAL/PLATELET
Basophils Absolute: 81 cells/uL (ref 0–200)
Basophils Relative: 1 %
Eosinophils Absolute: 162 cells/uL (ref 15–500)
Eosinophils Relative: 2 %
HCT: 41.7 % (ref 35.0–45.0)
HEMOGLOBIN: 13.9 g/dL (ref 11.7–15.5)
LYMPHS ABS: 3240 {cells}/uL (ref 850–3900)
LYMPHS PCT: 40 %
MCH: 29.7 pg (ref 27.0–33.0)
MCHC: 33.3 g/dL (ref 32.0–36.0)
MCV: 89.1 fL (ref 80.0–100.0)
MPV: 8.7 fL (ref 7.5–12.5)
Monocytes Absolute: 486 cells/uL (ref 200–950)
Monocytes Relative: 6 %
NEUTROS PCT: 51 %
Neutro Abs: 4131 cells/uL (ref 1500–7800)
Platelets: 400 10*3/uL (ref 140–400)
RBC: 4.68 MIL/uL (ref 3.80–5.10)
RDW: 15 % (ref 11.0–15.0)
WBC: 8.1 10*3/uL (ref 3.8–10.8)

## 2017-04-04 MED ORDER — DICLOFENAC SODIUM 1 % TD GEL: TRANSDERMAL | 3 refills | Status: DC

## 2017-04-05 ENCOUNTER — Ambulatory Visit (HOSPITAL_BASED_OUTPATIENT_CLINIC_OR_DEPARTMENT_OTHER): Payer: Medicare Other | Attending: Nurse Practitioner | Admitting: Internal Medicine

## 2017-04-05 VITALS — Ht 66.0 in | Wt 295.0 lb

## 2017-04-05 DIAGNOSIS — G4733 Obstructive sleep apnea (adult) (pediatric): Secondary | ICD-10-CM

## 2017-04-05 LAB — COMPLETE METABOLIC PANEL WITH GFR
ALT: 13 U/L (ref 6–29)
AST: 13 U/L (ref 10–30)
Albumin: 4.3 g/dL (ref 3.6–5.1)
Alkaline Phosphatase: 90 U/L (ref 33–115)
BUN: 11 mg/dL (ref 7–25)
CHLORIDE: 104 mmol/L (ref 98–110)
CO2: 18 mmol/L — ABNORMAL LOW (ref 20–31)
Calcium: 9.6 mg/dL (ref 8.6–10.2)
Creat: 0.61 mg/dL (ref 0.50–1.10)
GFR, Est African American: >89 mL/min (ref >=60)
GFR, Est Non African American: >89 mL/min (ref >=60)
GLUCOSE: 126 mg/dL — AB (ref 65–99)
POTASSIUM: 4.4 mmol/L (ref 3.5–5.3)
Sodium: 137 mmol/L (ref 135–146)
Total Bilirubin: 0.3 mg/dL (ref 0.2–1.2)
Total Protein: 8.2 g/dL — ABNORMAL HIGH (ref 6.1–8.1)

## 2017-04-07 LAB — QUANTIFERON TB GOLD ASSAY (BLOOD)
INTERFERON GAMMA RELEASE ASSAY: NEGATIVE
Mitogen-Nil: 8.24 IU/mL
Quantiferon Nil Value: 0.02 IU/mL
Quantiferon Tb Ag Minus Nil Value: 0.02 IU/mL

## 2017-04-08 DIAGNOSIS — G4733 Obstructive sleep apnea (adult) (pediatric): Secondary | ICD-10-CM

## 2017-04-08 NOTE — Procedures (Signed)
   Patient Name: Shelby Robertson, [place] Date: 04/05/2017 Gender: Female D.O.B: 1970-12-08 Age (years): 46 Referring Provider: Joyce Copa Height (inches): 65 Interpreting Physician: Baird Lyons MD, ABSM Weight (lbs): 290 RPSGT: Carolin Coy BMI: 48 MRN: [id number] Neck Size: 16.00 CLINICAL INFORMATION The patient is referred for a CPAP titration to treat sleep apnea.  Date of NPSG, Split Night or HST:  Diagnostic NPSG 10/03/16    AHI 22.2/ hr, desaturation to a nadir of 85%, body weight 290 lbs  SLEEP STUDY TECHNIQUE As per the AASM Manual for the Scoring of Sleep and Associated Events v2.3 (April 2016) with a hypopnea requiring 4% desaturations.  The channels recorded and monitored were frontal, central and occipital EEG, electrooculogram (EOG), submentalis EMG (chin), nasal and oral airflow, thoracic and abdominal wall motion, anterior tibialis EMG, snore microphone, electrocardiogram, and pulse oximetry. Continuous positive airway pressure (CPAP) was initiated at the beginning of the study and titrated to treat sleep-disordered breathing.  MEDICATIONS Medications self-administered by patient taken the night of the study : TYLENOL, IBUPROPHEN, CYMBALTA, [place] Comments added by technician: PATIENT WAS ORDERED AS A CPAP TITRATION  Comments added by scorer: N/A RESPIRATORY PARAMETERS Optimal PAP Pressure (cm): 13 AHI at Optimal Pressure (/hr): 1.1 Overall Minimal O2 (%): 85.00 Supine % at Optimal Pressure (%): 0 Minimal O2 at Optimal Pressure (%): 89.0   SLEEP ARCHITECTURE The study was initiated at 11:01:03 PM and ended at 4:57:36 AM.  Sleep onset time was 0.3 minutes and the sleep efficiency was 92.5%. The total sleep time was 329.7 minutes.  The patient spent 5.31% of the night in stage N1 sleep, 94.09% in stage N2 sleep, 0.61% in stage N3 and 0.00% in REM.Stage REM latency was N/A minutes  Wake after sleep onset was 26.5. Alpha intrusion was  absent. Supine sleep was 7.58%.  CARDIAC DATA The 2 lead EKG demonstrated sinus rhythm. The mean heart rate was 69.21 beats per minute. Other EKG findings include: None.  LEG MOVEMENT DATA The total Periodic Limb Movements of Sleep (PLMS) were 0. The PLMS index was 0.00. A PLMS index of <15 is considered normal in adults.  IMPRESSIONS - The optimal PAP pressure was 13 cm of water. - Central sleep apnea was not noted during this titration (CAI = 0.0/h). - Moderate oxygen desaturations were observed during this titration (min O2 = 85.00%, Mean 91.7% at therapeutic CPAP). - The patient snored with Soft snoring volume during this titration study. - No cardiac abnormalities were observed during this study. - Clinically significant periodic limb movements were not noted during this study. Arousals associated with PLMs were rare.  DIAGNOSIS - Obstructive Sleep Apnea (327.23 [G47.33 ICD-10])  RECOMMENDATIONS - Trial of CPAP therapy on 13 cm H2O with a Medium size Fisher&Paykel Full Face Mask Simplus mask and heated humidification. - Avoid alcohol, sedatives and other CNS depressants that may worsen sleep apnea and disrupt normal sleep architecture. - Sleep hygiene should be reviewed to assess factors that may improve sleep quality. - Weight management and regular exercise should be initiated or continued  [Electronically signed] 04/08/2017 03:39 PM  Baird Lyons MD, [place], American Board of Sleep Medicine   NPI: [id number]  [place], [place] [facility]  ELECTRONICALLY SIGNED ON:  04/08/2017, 3:34 PM [place] PH: [phone]   FX: [phone]) [place]

## 2017-04-12 ENCOUNTER — Telehealth: Payer: Self-pay | Admitting: Radiology

## 2017-04-12 NOTE — Telephone Encounter (Signed)
-----   Message from Eliezer Lofts, [place] sent at 04/07/2017  4:24 PM EDT ----- Send a copy of labs to PCP And tell patient #1: TB gold is negative #2: CBC with differential is normal #3: CMP with GFR is within normal limits

## 2017-04-12 NOTE — Telephone Encounter (Signed)
I have called patient to advise labs are normal  

## 2017-04-18 DIAGNOSIS — G894 Chronic pain syndrome: Secondary | ICD-10-CM | POA: Diagnosis not present

## 2017-04-18 DIAGNOSIS — M25561 Pain in right knee: Secondary | ICD-10-CM | POA: Diagnosis not present

## 2017-04-18 DIAGNOSIS — M25562 Pain in left knee: Secondary | ICD-10-CM | POA: Diagnosis not present

## 2017-04-18 DIAGNOSIS — M79605 Pain in left leg: Secondary | ICD-10-CM | POA: Diagnosis not present

## 2017-04-18 DIAGNOSIS — G4733 Obstructive sleep apnea (adult) (pediatric): Secondary | ICD-10-CM | POA: Diagnosis not present

## 2017-04-18 DIAGNOSIS — Z79891 Long term (current) use of opiate analgesic: Secondary | ICD-10-CM | POA: Diagnosis not present

## 2017-04-18 DIAGNOSIS — M79604 Pain in right leg: Secondary | ICD-10-CM | POA: Diagnosis not present

## 2017-05-04 ENCOUNTER — Encounter: Payer: Medicare Other | Admitting: Advanced Practice Midwife

## 2017-05-04 DIAGNOSIS — N911 Secondary amenorrhea: Secondary | ICD-10-CM | POA: Diagnosis not present

## 2017-05-04 DIAGNOSIS — M129 Arthropathy, unspecified: Secondary | ICD-10-CM | POA: Diagnosis not present

## 2017-05-04 DIAGNOSIS — R0789 Other chest pain: Secondary | ICD-10-CM | POA: Diagnosis not present

## 2017-05-04 DIAGNOSIS — Z79899 Other long term (current) drug therapy: Secondary | ICD-10-CM | POA: Diagnosis not present

## 2017-05-04 DIAGNOSIS — M7552 Bursitis of left shoulder: Secondary | ICD-10-CM | POA: Diagnosis not present

## 2017-05-13 DIAGNOSIS — F17200 Nicotine dependence, unspecified, uncomplicated: Secondary | ICD-10-CM | POA: Diagnosis not present

## 2017-05-13 DIAGNOSIS — I10 Essential (primary) hypertension: Secondary | ICD-10-CM | POA: Diagnosis not present

## 2017-05-13 DIAGNOSIS — R0789 Other chest pain: Secondary | ICD-10-CM | POA: Diagnosis not present

## 2017-05-13 DIAGNOSIS — L02419 Cutaneous abscess of limb, unspecified: Secondary | ICD-10-CM | POA: Diagnosis not present

## 2017-05-13 DIAGNOSIS — Z87891 Personal history of nicotine dependence: Secondary | ICD-10-CM | POA: Diagnosis not present

## 2017-05-19 DIAGNOSIS — G4733 Obstructive sleep apnea (adult) (pediatric): Secondary | ICD-10-CM | POA: Diagnosis not present

## 2017-05-19 DIAGNOSIS — M79604 Pain in right leg: Secondary | ICD-10-CM | POA: Diagnosis not present

## 2017-05-19 DIAGNOSIS — Z79891 Long term (current) use of opiate analgesic: Secondary | ICD-10-CM | POA: Diagnosis not present

## 2017-05-19 DIAGNOSIS — M79605 Pain in left leg: Secondary | ICD-10-CM | POA: Diagnosis not present

## 2017-05-19 DIAGNOSIS — M25562 Pain in left knee: Secondary | ICD-10-CM | POA: Diagnosis not present

## 2017-05-19 DIAGNOSIS — G894 Chronic pain syndrome: Secondary | ICD-10-CM | POA: Diagnosis not present

## 2017-05-19 DIAGNOSIS — M79671 Pain in right foot: Secondary | ICD-10-CM | POA: Diagnosis not present

## 2017-05-19 DIAGNOSIS — M25561 Pain in right knee: Secondary | ICD-10-CM | POA: Diagnosis not present

## 2017-05-24 ENCOUNTER — Other Ambulatory Visit: Payer: Self-pay | Admitting: Cardiovascular Disease

## 2017-05-24 ENCOUNTER — Other Ambulatory Visit: Payer: Self-pay | Admitting: Rheumatology

## 2017-05-24 NOTE — Telephone Encounter (Signed)
Please review for refill, thanks ! 

## 2017-05-24 NOTE — Telephone Encounter (Signed)
Last Visit: 04/04/17 Next Visit: 09/06/17 Labs: 04/04/17 WNL TB Gold: 04/04/17 Neg  Okay to refill per Dr. Estanislado Pandy

## 2017-06-17 DIAGNOSIS — M542 Cervicalgia: Secondary | ICD-10-CM | POA: Diagnosis not present

## 2017-06-17 DIAGNOSIS — M79661 Pain in right lower leg: Secondary | ICD-10-CM | POA: Diagnosis not present

## 2017-06-17 DIAGNOSIS — M25572 Pain in left ankle and joints of left foot: Secondary | ICD-10-CM | POA: Diagnosis not present

## 2017-06-17 DIAGNOSIS — M79662 Pain in left lower leg: Secondary | ICD-10-CM | POA: Diagnosis not present

## 2017-06-17 DIAGNOSIS — G4733 Obstructive sleep apnea (adult) (pediatric): Secondary | ICD-10-CM | POA: Diagnosis not present

## 2017-06-17 DIAGNOSIS — M79671 Pain in right foot: Secondary | ICD-10-CM | POA: Diagnosis not present

## 2017-06-17 DIAGNOSIS — M79672 Pain in left foot: Secondary | ICD-10-CM | POA: Diagnosis not present

## 2017-06-17 DIAGNOSIS — G894 Chronic pain syndrome: Secondary | ICD-10-CM | POA: Diagnosis not present

## 2017-06-24 MED FILL — FOLIC ACID 1 MG TABLET: 1 | 90 days supply | Qty: 180 | Fill #2

## 2017-06-24 MED FILL — METOPROLOL TARTRATE 50 MG T: 50 | 90 days supply | Qty: 180 | Fill #0

## 2017-06-24 MED FILL — HUMIRA PEN 40 MG/0.8ML PNKT: 40 | 84 days supply | Qty: 6 | Fill #0

## 2017-06-24 MED FILL — METHOTREXATE 25 MG/ML VIAL: 50 | 84 days supply | Qty: 12 | Fill #0

## 2017-07-15 DIAGNOSIS — M542 Cervicalgia: Secondary | ICD-10-CM | POA: Diagnosis not present

## 2017-07-15 DIAGNOSIS — G894 Chronic pain syndrome: Secondary | ICD-10-CM | POA: Diagnosis not present

## 2017-07-15 DIAGNOSIS — M79662 Pain in left lower leg: Secondary | ICD-10-CM | POA: Diagnosis not present

## 2017-07-15 DIAGNOSIS — M79672 Pain in left foot: Secondary | ICD-10-CM | POA: Diagnosis not present

## 2017-07-15 DIAGNOSIS — M79671 Pain in right foot: Secondary | ICD-10-CM | POA: Diagnosis not present

## 2017-07-15 DIAGNOSIS — M25572 Pain in left ankle and joints of left foot: Secondary | ICD-10-CM | POA: Diagnosis not present

## 2017-07-15 DIAGNOSIS — G4733 Obstructive sleep apnea (adult) (pediatric): Secondary | ICD-10-CM | POA: Diagnosis not present

## 2017-07-15 DIAGNOSIS — M79661 Pain in right lower leg: Secondary | ICD-10-CM | POA: Diagnosis not present

## 2017-07-18 DIAGNOSIS — R7309 Other abnormal glucose: Secondary | ICD-10-CM | POA: Diagnosis not present

## 2017-07-18 DIAGNOSIS — M069 Rheumatoid arthritis, unspecified: Secondary | ICD-10-CM | POA: Diagnosis not present

## 2017-07-18 DIAGNOSIS — R609 Edema, unspecified: Secondary | ICD-10-CM | POA: Diagnosis not present

## 2017-07-18 DIAGNOSIS — I10 Essential (primary) hypertension: Secondary | ICD-10-CM | POA: Diagnosis not present

## 2017-07-27 DIAGNOSIS — R5383 Other fatigue: Secondary | ICD-10-CM | POA: Insufficient documentation

## 2017-07-27 DIAGNOSIS — Z8679 Personal history of other diseases of the circulatory system: Secondary | ICD-10-CM | POA: Insufficient documentation

## 2017-07-27 DIAGNOSIS — Z872 Personal history of diseases of the skin and subcutaneous tissue: Secondary | ICD-10-CM | POA: Insufficient documentation

## 2017-07-27 DIAGNOSIS — Z8639 Personal history of other endocrine, nutritional and metabolic disease: Secondary | ICD-10-CM | POA: Insufficient documentation

## 2017-07-27 DIAGNOSIS — M797 Fibromyalgia: Secondary | ICD-10-CM | POA: Insufficient documentation

## 2017-07-27 DIAGNOSIS — F17200 Nicotine dependence, unspecified, uncomplicated: Secondary | ICD-10-CM | POA: Insufficient documentation

## 2017-07-27 NOTE — Progress Notes (Deleted)
Office Visit Note  Patient: Shelby Robertson             Date of Birth: 1970-11-25           MRN: [id number]             PCP: Minette Brine Referring: Minette Brine, FNP Visit Date: 07/29/2017 Occupation: @GUAROCC @    Subjective:  No chief complaint on file.   History of Present Illness: Shelby Robertson is a 46 y.o. female ***   Activities of Daily Living:  Patient reports morning stiffness for *** {minute/hour:19697}.   Patient {ACTIONS;DENIES/REPORTS:21021675::"Denies"} nocturnal pain.  Difficulty dressing/grooming: {ACTIONS;DENIES/REPORTS:21021675::"Denies"} Difficulty climbing stairs: {ACTIONS;DENIES/REPORTS:21021675::"Denies"} Difficulty getting out of chair: {ACTIONS;DENIES/REPORTS:21021675::"Denies"} Difficulty using hands for taps, buttons, cutlery, and/or writing: {ACTIONS;DENIES/REPORTS:21021675::"Denies"}   No Rheumatology ROS completed.   PMFS History:  Patient Active Problem List   Diagnosis Date Noted  . Fibromyalgia 07/27/2017  . Other fatigue 07/27/2017  . History of hidradenitis suppurativa 07/27/2017  . History of hypertension 07/27/2017  . History of hyperlipidemia 07/27/2017  . Smoker 07/27/2017  . High risk medication use 08/21/2016  . Rheumatoid arthritis of multiple sites without rheumatoid factor ([place]) 08/12/2016  . ARTHRITIS, GENERALIZED 07/14/2010  . UTI 10/27/2009  . FURUNCULOSIS 08/19/2009  . HYPERTENSION, BENIGN ESSENTIAL 03/04/2009  . POLYURIA 03/04/2009  . FIBROMYALGIA 11/09/2007  . HEADACHE 11/09/2007  . HIDRADENITIS SUPPURATIVA 07/11/2007  . DYSLIPIDEMIA 06/28/2007  . LEG EDEMA, BILATERAL 05/05/2007    Past Medical History:  Diagnosis Date  . Fibromyalgia   . Fibromyalgia   . Hypertension   . Osteoarthritis   . Rheumatoid arthritis(714.0)   . Rheumatoid arthritis(714.0)     Family History  Problem Relation Age of Onset  . Heart attack Maternal Grandmother   . Stroke Maternal Grandmother    No past surgical history on  file. Social History   Social History Narrative  . No narrative on file     Objective: Vital Signs: There were no vitals taken for this visit.   Physical Exam   Musculoskeletal Exam: ***  CDAI Exam: No CDAI exam completed.    Investigation: Findings:  04/04/17 negative TB gold   CBC Latest Ref Rng & Units 04/04/2017 02/16/2017 11/02/2016  WBC 3.8 - 10.8 K/uL 8.1 10.0 10.5  Hemoglobin 11.7 - 15.5 g/dL 13.9 12.1 12.8  Hematocrit 35.0 - 45.0 % 41.7 36.5 38.4  Platelets 140 - 400 K/uL 400 425(H) 432(H)   CMP Latest Ref Rng & Units 04/04/2017 02/16/2017 11/02/2016  Glucose 65 - 99 mg/dL 126(H) 111(H) 106(H)  BUN 7 - 25 mg/dL 11 9 7   Creatinine 0.50 - 1.10 mg/dL 0.61 0.59 0.62  Sodium 135 - 146 mmol/L 137 140 136  Potassium 3.5 - 5.3 mmol/L 4.4 3.4(L) 4.0  Chloride 98 - 110 mmol/L 104 104 103  CO2 20 - 31 mmol/L 18(L) 24 23  Calcium 8.6 - 10.2 mg/dL 9.6 8.9 9.4  Total Protein 6.1 - 8.1 g/dL 8.2(H) 7.7 7.9  Total Bilirubin 0.2 - 1.2 mg/dL 0.3 0.3 0.3  Alkaline Phos 33 - 115 U/L 90 84 79  AST 10 - 30 U/L 13 13 12   ALT 6 - 29 U/L 13 13 10     Imaging: No results found.  Speciality Comments: No specialty comments available.    Procedures:  No procedures performed Allergies: Patient has no known allergies.   Assessment / Plan:     Visit Diagnoses: Rheumatoid arthritis of multiple sites without rheumatoid factor (HCC)  High risk medication use  Fibromyalgia  Other fatigue  History of hidradenitis suppurativa  History of hypertension  History of hyperlipidemia  Smoker    Orders: No orders of the defined types were placed in this encounter.  No orders of the defined types were placed in this encounter.   Face-to-face time spent with patient was *** minutes. 50% of time was spent in counseling and coordination of care.  Follow-Up Instructions: No Follow-up on file.   Amy Littrell, RT  Note - This record has been created using [employer].  Chart  creation errors have been sought, but may not always  have been located. Such creation errors do not reflect on  the standard of medical care.

## 2017-07-29 ENCOUNTER — Ambulatory Visit: Payer: Medicare Other | Admitting: Rheumatology

## 2017-08-11 ENCOUNTER — Other Ambulatory Visit: Payer: Self-pay | Admitting: Nurse Practitioner

## 2017-08-11 DIAGNOSIS — N6459 Other signs and symptoms in breast: Secondary | ICD-10-CM

## 2017-08-12 DIAGNOSIS — G894 Chronic pain syndrome: Secondary | ICD-10-CM | POA: Diagnosis not present

## 2017-08-12 DIAGNOSIS — M25561 Pain in right knee: Secondary | ICD-10-CM | POA: Diagnosis not present

## 2017-08-12 DIAGNOSIS — M25512 Pain in left shoulder: Secondary | ICD-10-CM | POA: Diagnosis not present

## 2017-08-12 DIAGNOSIS — M542 Cervicalgia: Secondary | ICD-10-CM | POA: Diagnosis not present

## 2017-08-12 DIAGNOSIS — Z79891 Long term (current) use of opiate analgesic: Secondary | ICD-10-CM | POA: Diagnosis not present

## 2017-08-13 NOTE — Progress Notes (Deleted)
Office Visit Note  Patient: Shelby Robertson             Date of Birth: 1971-04-27           MRN: [id number]             PCP: Minette Brine Referring: Minette Brine, FNP Visit Date: 08/24/2017 Occupation: @GUAROCC @    Subjective:  No chief complaint on file.   History of Present Illness: Shelby Robertson is a 46 y.o. female ***   Activities of Daily Living:  Patient reports morning stiffness for *** {minute/hour:19697}.   Patient {ACTIONS;DENIES/REPORTS:21021675::"Denies"} nocturnal pain.  Difficulty dressing/grooming: {ACTIONS;DENIES/REPORTS:21021675::"Denies"} Difficulty climbing stairs: {ACTIONS;DENIES/REPORTS:21021675::"Denies"} Difficulty getting out of chair: {ACTIONS;DENIES/REPORTS:21021675::"Denies"} Difficulty using hands for taps, buttons, cutlery, and/or writing: {ACTIONS;DENIES/REPORTS:21021675::"Denies"}   No Rheumatology ROS completed.   PMFS History:  Patient Active Problem List   Diagnosis Date Noted  . Fibromyalgia 07/27/2017  . Other fatigue 07/27/2017  . History of hidradenitis suppurativa 07/27/2017  . History of hypertension 07/27/2017  . History of hyperlipidemia 07/27/2017  . Smoker 07/27/2017  . High risk medication use 08/21/2016  . Rheumatoid arthritis of multiple sites without rheumatoid factor ([place]) 08/12/2016  . ARTHRITIS, GENERALIZED 07/14/2010  . UTI 10/27/2009  . FURUNCULOSIS 08/19/2009  . HYPERTENSION, BENIGN ESSENTIAL 03/04/2009  . POLYURIA 03/04/2009  . FIBROMYALGIA 11/09/2007  . HEADACHE 11/09/2007  . HIDRADENITIS SUPPURATIVA 07/11/2007  . DYSLIPIDEMIA 06/28/2007  . LEG EDEMA, BILATERAL 05/05/2007    Past Medical History:  Diagnosis Date  . Fibromyalgia   . Fibromyalgia   . Hypertension   . Osteoarthritis   . Rheumatoid arthritis(714.0)   . Rheumatoid arthritis(714.0)     Family History  Problem Relation Age of Onset  . Heart attack Maternal Grandmother   . Stroke Maternal Grandmother    No past surgical history on  file. Social History   Social History Narrative  . No narrative on file     Objective: Vital Signs: There were no vitals taken for this visit.   Physical Exam   Musculoskeletal Exam: ***  CDAI Exam: No CDAI exam completed.    Investigation: No additional findings. TB Gold: 03/2017 Negative  CBC Latest Ref Rng & Units 04/04/2017 02/16/2017 11/02/2016  WBC 3.8 - 10.8 K/uL 8.1 10.0 10.5  Hemoglobin 11.7 - 15.5 g/dL 13.9 12.1 12.8  Hematocrit 35.0 - 45.0 % 41.7 36.5 38.4  Platelets 140 - 400 K/uL 400 425(H) 432(H)   CMP Latest Ref Rng & Units 04/04/2017 02/16/2017 11/02/2016  Glucose 65 - 99 mg/dL 126(H) 111(H) 106(H)  BUN 7 - 25 mg/dL 11 9 7   Creatinine 0.50 - 1.10 mg/dL 0.61 0.59 0.62  Sodium 135 - 146 mmol/L 137 140 136  Potassium 3.5 - 5.3 mmol/L 4.4 3.4(L) 4.0  Chloride 98 - 110 mmol/L 104 104 103  CO2 20 - 31 mmol/L 18(L) 24 23  Calcium 8.6 - 10.2 mg/dL 9.6 8.9 9.4  Total Protein 6.1 - 8.1 g/dL 8.2(H) 7.7 7.9  Total Bilirubin 0.2 - 1.2 mg/dL 0.3 0.3 0.3  Alkaline Phos 33 - 115 U/L 90 84 79  AST 10 - 30 U/L 13 13 12   ALT 6 - 29 U/L 13 13 10     Imaging: No results found.  Speciality Comments: No specialty comments available.    Procedures:  No procedures performed Allergies: Patient has no known allergies.   Assessment / Plan:     Visit Diagnoses: No diagnosis found.    Orders: No orders of the defined types were placed in  this encounter.  No orders of the defined types were placed in this encounter.   Face-to-face time spent with patient was *** minutes. 50% of time was spent in counseling and coordination of care.  Follow-Up Instructions: No Follow-up on file.   Earnestine Mealing, CMA  Note - This record has been created using [employer], [employer].  Chart creation errors have been sought, but may not always  have been located. Such creation errors do not reflect on  the standard of medical care.

## 2017-08-17 ENCOUNTER — Ambulatory Visit: Payer: Medicare Other

## 2017-08-17 ENCOUNTER — Ambulatory Visit
Admission: RE | Admit: 2017-08-17 | Discharge: 2017-08-17 | Disposition: A | Discharge status: Home / Self Care | Payer: Medicare Other | Source: Ambulatory Visit | Attending: Nurse Practitioner | Admitting: Nurse Practitioner

## 2017-08-17 DIAGNOSIS — N6459 Other signs and symptoms in breast: Secondary | ICD-10-CM

## 2017-08-17 DIAGNOSIS — R928 Other abnormal and inconclusive findings on diagnostic imaging of breast: Secondary | ICD-10-CM | POA: Diagnosis not present

## 2017-08-24 ENCOUNTER — Ambulatory Visit: Payer: Medicare Other | Admitting: Rheumatology

## 2017-09-05 ENCOUNTER — Other Ambulatory Visit: Payer: Self-pay | Admitting: Rheumatology

## 2017-09-06 ENCOUNTER — Other Ambulatory Visit: Payer: Self-pay

## 2017-09-06 ENCOUNTER — Ambulatory Visit: Payer: Medicare Other | Admitting: Rheumatology

## 2017-09-06 ENCOUNTER — Other Ambulatory Visit: Payer: Self-pay | Admitting: Rheumatology

## 2017-09-06 ENCOUNTER — Other Ambulatory Visit: Payer: Self-pay | Admitting: *Deleted

## 2017-09-06 DIAGNOSIS — Z79899 Other long term (current) drug therapy: Secondary | ICD-10-CM | POA: Diagnosis not present

## 2017-09-06 LAB — COMPLETE METABOLIC PANEL WITH GFR
AG RATIO: 1.1 (calc) (ref 1.0–2.5)
ALKALINE PHOSPHATASE (APISO): 81 U/L (ref 33–115)
ALT: 13 U/L (ref 6–29)
AST: 11 U/L (ref 10–30)
Albumin: 4.2 g/dL (ref 3.6–5.1)
BILIRUBIN TOTAL: 0.3 mg/dL (ref 0.2–1.2)
BUN: 8 mg/dL (ref 7–25)
CHLORIDE: 103 mmol/L (ref 98–110)
CO2: 27 mmol/L (ref 20–32)
Calcium: 9.3 mg/dL (ref 8.6–10.2)
Creat: 0.62 mg/dL (ref 0.50–1.10)
GFR, Est African American: 127 mL/min/{1.73_m2} (ref >=60)
GFR, Est Non African American: 110 mL/min/{1.73_m2} (ref >=60)
GLUCOSE: 164 mg/dL — AB (ref 65–99)
Globulin: 3.8 g/dL (calc) — ABNORMAL HIGH (ref 1.9–3.7)
POTASSIUM: 4.6 mmol/L (ref 3.5–5.3)
Sodium: 137 mmol/L (ref 135–146)
TOTAL PROTEIN: 8 g/dL (ref 6.1–8.1)

## 2017-09-06 LAB — CBC WITH DIFFERENTIAL/PLATELET
Basophils Absolute: 50 cells/uL (ref 0–200)
Basophils Relative: 0.6 %
EOS PCT: 2.1 %
Eosinophils Absolute: 174 cells/uL (ref 15–500)
HEMATOCRIT: 39.6 % (ref 35.0–45.0)
HEMOGLOBIN: 13.4 g/dL (ref 11.7–15.5)
LYMPHS ABS: 2830 {cells}/uL (ref 850–3900)
MCH: 29.6 pg (ref 27.0–33.0)
MCHC: 33.8 g/dL (ref 32.0–36.0)
MCV: 87.4 fL (ref 80.0–100.0)
MONOS PCT: 4.9 %
MPV: 9.4 fL (ref 7.5–12.5)
NEUTROS ABS: 4839 {cells}/uL (ref 1500–7800)
Neutrophils Relative %: 58.3 %
Platelets: 426 10*3/uL — ABNORMAL HIGH (ref 140–400)
RBC: 4.53 10*6/uL (ref 3.80–5.10)
RDW: 13.3 % (ref 11.0–15.0)
Total Lymphocyte: 34.1 %
WBC mixed population: 407 cells/uL (ref 200–950)
WBC: 8.3 10*3/uL (ref 3.8–10.8)

## 2017-09-06 MED FILL — HUMIRA PEN 40 MG/0.8ML PNKT: 40 | 28 days supply | Qty: 2 | Fill #0

## 2017-09-06 NOTE — Telephone Encounter (Addendum)
Last Visit: 04/04/17 Next Visit: 12/21/17 Labs: 04/04/17 WNL TB Gold: 04/04/17 Neg  Patient will update labs today  Okay to refill 30 day supply per Dr. Estanislado Pandy

## 2017-09-07 NOTE — Telephone Encounter (Signed)
Last Visit: 04/04/17 Next Visit: 12/21/17 Labs: 09/06/17 Elevated glucose  Okay to refill per Dr. Estanislado Pandy

## 2017-09-12 ENCOUNTER — Encounter (HOSPITAL_COMMUNITY): Payer: Self-pay

## 2017-09-12 ENCOUNTER — Other Ambulatory Visit: Payer: Self-pay

## 2017-09-12 DIAGNOSIS — R109 Unspecified abdominal pain: Secondary | ICD-10-CM | POA: Insufficient documentation

## 2017-09-12 DIAGNOSIS — Z5321 Procedure and treatment not carried out due to patient leaving prior to being seen by health care provider: Secondary | ICD-10-CM | POA: Insufficient documentation

## 2017-09-12 DIAGNOSIS — I10 Essential (primary) hypertension: Secondary | ICD-10-CM | POA: Diagnosis not present

## 2017-09-12 DIAGNOSIS — F17210 Nicotine dependence, cigarettes, uncomplicated: Secondary | ICD-10-CM | POA: Diagnosis not present

## 2017-09-12 DIAGNOSIS — M545 Low back pain: Secondary | ICD-10-CM | POA: Diagnosis not present

## 2017-09-12 DIAGNOSIS — Z79899 Other long term (current) drug therapy: Secondary | ICD-10-CM | POA: Diagnosis not present

## 2017-09-12 LAB — COMPREHENSIVE METABOLIC PANEL
ALBUMIN: 3.7 g/dL (ref 3.5–5.0)
ALT: 17 U/L (ref 14–54)
ANION GAP: 9 (ref 5–15)
AST: 15 U/L (ref 15–41)
Alkaline Phosphatase: 77 U/L (ref 38–126)
BILIRUBIN TOTAL: 0.3 mg/dL (ref 0.3–1.2)
BUN: 9 mg/dL (ref 6–20)
CO2: 20 mmol/L — ABNORMAL LOW (ref 22–32)
Calcium: 8.9 mg/dL (ref 8.9–10.3)
Chloride: 106 mmol/L (ref 101–111)
Creatinine, Ser: 0.62 mg/dL (ref 0.44–1.00)
GFR calc Af Amer: >60 mL/min (ref >60)
Glucose, Bld: 126 mg/dL — ABNORMAL HIGH (ref 65–99)
POTASSIUM: 3.8 mmol/L (ref 3.5–5.1)
Sodium: 135 mmol/L (ref 135–145)
TOTAL PROTEIN: 8.1 g/dL (ref 6.5–8.1)

## 2017-09-12 LAB — URINALYSIS, ROUTINE W REFLEX MICROSCOPIC
BILIRUBIN URINE: NEGATIVE
Glucose, UA: NEGATIVE mg/dL
HGB URINE DIPSTICK: NEGATIVE
Ketones, ur: NEGATIVE mg/dL
NITRITE: NEGATIVE
PROTEIN: NEGATIVE mg/dL
SPECIFIC GRAVITY, URINE: 1.021 (ref 1.005–1.030)
pH: 5 (ref 5.0–8.0)

## 2017-09-12 LAB — CBC
HEMATOCRIT: 37.3 % (ref 36.0–46.0)
HEMOGLOBIN: 12.5 g/dL (ref 12.0–15.0)
MCH: 29.6 pg (ref 26.0–34.0)
MCHC: 33.5 g/dL (ref 30.0–36.0)
MCV: 88.4 fL (ref 78.0–100.0)
Platelets: 359 10*3/uL (ref 150–400)
RBC: 4.22 MIL/uL (ref 3.87–5.11)
RDW: 13.8 % (ref 11.5–15.5)
WBC: 7.3 10*3/uL (ref 4.0–10.5)

## 2017-09-12 LAB — I-STAT BETA HCG BLOOD, ED (MC, WL, AP ONLY)

## 2017-09-12 LAB — LIPASE, BLOOD: Lipase: 24 U/L (ref 11–51)

## 2017-09-12 NOTE — ED Triage Notes (Signed)
Pt states that for the past three days she has been having L upper abd pain/ flank pain. Pt denies n/v/d or dysuria.

## 2017-09-13 ENCOUNTER — Emergency Department (HOSPITAL_COMMUNITY)
Admission: EM | Admit: 2017-09-13 | Discharge: 2017-09-13 | Disposition: A | Discharge status: Home / Self Care | Payer: Medicare Other | Attending: Emergency Medicine | Admitting: Emergency Medicine

## 2017-09-13 ENCOUNTER — Other Ambulatory Visit: Payer: Self-pay

## 2017-09-13 ENCOUNTER — Emergency Department (HOSPITAL_COMMUNITY)
Admission: EM | Admit: 2017-09-13 | Discharge: 2017-09-13 | Disposition: A | Discharge status: Home / Self Care | Payer: Medicare Other | Source: Home / Self Care

## 2017-09-13 ENCOUNTER — Encounter (HOSPITAL_COMMUNITY): Payer: Self-pay | Admitting: Emergency Medicine

## 2017-09-13 DIAGNOSIS — Z79899 Other long term (current) drug therapy: Secondary | ICD-10-CM | POA: Insufficient documentation

## 2017-09-13 DIAGNOSIS — M545 Low back pain, unspecified: Secondary | ICD-10-CM

## 2017-09-13 DIAGNOSIS — F17210 Nicotine dependence, cigarettes, uncomplicated: Secondary | ICD-10-CM | POA: Insufficient documentation

## 2017-09-13 DIAGNOSIS — I10 Essential (primary) hypertension: Secondary | ICD-10-CM | POA: Insufficient documentation

## 2017-09-13 LAB — URINALYSIS, ROUTINE W REFLEX MICROSCOPIC
BACTERIA UA: NONE SEEN
Bilirubin Urine: NEGATIVE
Glucose, UA: NEGATIVE mg/dL
HGB URINE DIPSTICK: NEGATIVE
Ketones, ur: NEGATIVE mg/dL
NITRITE: NEGATIVE
PROTEIN: NEGATIVE mg/dL
Specific Gravity, Urine: 1.023 (ref 1.005–1.030)
pH: 5 (ref 5.0–8.0)

## 2017-09-13 LAB — PREGNANCY, URINE: PREG TEST UR: NEGATIVE

## 2017-09-13 MED ORDER — KETOROLAC TROMETHAMINE 30 MG/ML IJ SOLN
60.0000 mg | Freq: Once | INTRAMUSCULAR | Status: AC
  Administered 2017-09-13: 60 mg via INTRAMUSCULAR
  Filled 2017-09-13: qty 2

## 2017-09-13 MED ORDER — OXYCODONE-ACETAMINOPHEN 5-325 MG PO TABS
1.0000 | ORAL_TABLET | Freq: Once | ORAL | Status: AC
  Administered 2017-09-13: 1 via ORAL
  Filled 2017-09-13: qty 1

## 2017-09-13 NOTE — ED Notes (Signed)
Pt informed she cannot drive within 6 hours of taking narcotics. Pt stases she understands instructions.

## 2017-09-13 NOTE — ED Notes (Signed)
Pt staets she understands instructions. Pt states pain improved. Home with steady gait.

## 2017-09-13 NOTE — ED Notes (Signed)
Doctor at bedside.

## 2017-09-13 NOTE — ED Notes (Signed)
Pt states that she does not want to wait due to wait times.

## 2017-09-13 NOTE — ED Provider Notes (Signed)
[facility] EMERGENCY DEPARTMENT Provider Note   CSN: [id number] Arrival date & time: 09/13/17  0202     History   Chief Complaint Chief Complaint  Patient presents with  . Flank Pain    HPI Shelby Robertson is a 46 y.o. female.  HPI  Shunt with history of rheumatoid and osteoarthritis, fibromyalgia, presenting with left sided mid back pain.  She states the pain has been ongoing for the past 3 days and she has not gotten relief from her home medications.  She denies any specific injury or trauma.  Pain is worse with certain movements and positions.  She denies any dysuria.  She has no difficulty breathing.  There is no abdominal pain associated.  She denies any weakness of her legs or urinary retention or incontinence. There are no other associated systemic symptoms, there are no other alleviating or modifying factors.   Past Medical History:  Diagnosis Date  . Fibromyalgia   . Fibromyalgia   . Hypertension   . Osteoarthritis   . Rheumatoid arthritis(714.0)   . Rheumatoid arthritis(714.0)     Patient Active Problem List   Diagnosis Date Noted  . Fibromyalgia 07/27/2017  . Other fatigue 07/27/2017  . History of hidradenitis suppurativa 07/27/2017  . History of hypertension 07/27/2017  . History of hyperlipidemia 07/27/2017  . Smoker 07/27/2017  . High risk medication use 08/21/2016  . Rheumatoid arthritis of multiple sites without rheumatoid factor ([place]) 08/12/2016  . ARTHRITIS, GENERALIZED 07/14/2010  . UTI 10/27/2009  . FURUNCULOSIS 08/19/2009  . HYPERTENSION, BENIGN ESSENTIAL 03/04/2009  . POLYURIA 03/04/2009  . FIBROMYALGIA 11/09/2007  . HEADACHE 11/09/2007  . HIDRADENITIS SUPPURATIVA 07/11/2007  . DYSLIPIDEMIA 06/28/2007  . LEG EDEMA, BILATERAL 05/05/2007    History reviewed. No pertinent surgical history.  OB History    No data available       Home Medications    Prior to Admission medications   Medication Sig Start Date End Date  Taking? Authorizing Provider  acetaminophen (TYLENOL) 325 MG tablet Take 650 mg by mouth every 6 (six) hours as needed for pain.   Yes [provider]  amLODipine (NORVASC) 10 MG tablet Take 10 mg by mouth daily.   Yes [provider]  DULoxetine (CYMBALTA) 60 MG capsule Take 60 mg by mouth daily.     Yes [provider]  folic acid (FOLVITE) 1 MG tablet Take 2 tablets (2 mg total) by mouth daily. 10/28/16 01/21/18 Yes Deveshwar, Abel Presto, MD  furosemide (LASIX) 20 MG tablet Take 1 tablet by mouth daily. 01/20/15  Yes [provider]  HUMIRA PEN 40 MG/0.8ML PNKT INJECT 1 PEN INTO THE SKIN EVERY 14 DAYS 09/06/17  Yes Deveshwar, Abel Presto, MD  hydrochlorothiazide (HYDRODIURIL) 12.5 MG tablet Take 12.5 tablets by mouth daily. 07/18/17  Yes [provider]  HYDROcodone-acetaminophen (NORCO) 10-325 MG per tablet Take 1 tablet by mouth every 6 (six) hours as needed for moderate pain.    Yes [provider]  methotrexate 50 MG/2ML injection INJECT 0.8 ML SUBCUTANEOUSLY ONCE A WEEK 09/07/17  Yes Deveshwar, Abel Presto, MD  metoprolol tartrate (LOPRESSOR) 50 MG tablet TAKE 1 TABLET BY MOUTH 2 TIMES DAILY. 05/24/17  Yes Skeet Latch, MD  tiZANidine (ZANAFLEX) 4 MG tablet Take 4 mg by mouth every 6 (six) hours as needed (for muscle spasms).    Yes [provider]  ZETIA 10 MG tablet Take 1 tablet by mouth daily. 12/18/14  Yes [provider]  diclofenac sodium (VOLTAREN) 1 %  GEL Voltaren Gel 3 grams to 3 large joints upto TID 3 TUBES with 3 refills Patient not taking: Reported on 09/13/2017 04/04/17   Panwala, Jodelle Green, PA-C  Tuberculin-Allergy Syringes 27G X 1/2" 1 ML MISC Inject 1 Syringe into the skin once a week. 10/28/16   Bo Merino, MD  varenicline (CHANTIX CONTINUING MONTH PAK) 1 MG tablet Take 1 tablet (1 mg total) by mouth 2 (two) times daily. Patient not taking: Reported on 04/04/2017 11/17/16   Skeet Latch, MD  varenicline (CHANTIX  STARTING MONTH PAK) 0.5 MG X 11 & 1 MG X 42 tablet Take one 0.5 mg tablet by mouth once daily for 3 days, then increase to one 0.5 mg tablet twice daily for 4 days, then increase to one 1 mg tablet twice daily. Patient not taking: Reported on 04/04/2017 11/17/16   Skeet Latch, MD    Family History Family History  Problem Relation Age of Onset  . Heart attack Maternal Grandmother   . Stroke Maternal Grandmother     Social History Social History   Tobacco Use  . Smoking status: Current Every Day Smoker    Packs/day: 0.50    Types: Cigarettes  . Smokeless tobacco: Never Used  Substance Use Topics  . Alcohol use: No  . Drug use: No     Allergies   Patient has no known allergies.   Review of Systems Review of Systems  ROS reviewed and all otherwise negative except for mentioned in HPI   Physical Exam Updated Vital Signs BP (!) 145/99 (BP Location: Right Arm)   Pulse (!) 106   Temp 98.2 F (36.8 C) (Oral)   Resp 16   Ht 5\' 6"  (1.676 m)   Wt 127 kg (280 lb)   SpO2 95%   BMI 45.19 kg/m  Vitals reviewed Physical Exam  Physical Examination: General appearance - alert, well appearing, and in no distress Mental status - alert, oriented to person, place, and time Eyes - no conjunctival injection, no scleral icterus Chest - clear to auscultation, no wheezes, rales or rhonchi, symmetric air entry Heart - normal rate, regular rhythm, normal S1, S2, no murmurs, rubs, clicks or gallops Abdomen - soft, nontender, nondistended, no masses or organomegaly Back exam - no midline tenderness to palpation, left sided lumbar paraspinal tenderness to palpation, no CVA tenderness Neurological - alert, oriented x 3, strength 5/5 in bilateral lower extremities, sensation intact Extremities - peripheral pulses normal, no pedal edema, no clubbing or cyanosis Skin - normal coloration and turgor, no rashes   ED Treatments / Results  Labs (all labs ordered are listed, but only abnormal  results are displayed) Labs Reviewed  URINALYSIS, ROUTINE W REFLEX MICROSCOPIC - Abnormal; Notable for the following components:      Result Value   APPearance HAZY (*)    Leukocytes, UA SMALL (*)    Squamous Epithelial / LPF 6-30 (*)    All other components within normal limits  PREGNANCY, URINE    EKG  EKG Interpretation None       Radiology No results found.  Procedures Procedures (including critical care time)  Medications Ordered in ED Medications  ketorolac (TORADOL) 30 MG/ML injection 60 mg (60 mg Intramuscular Given 09/13/17 1018)  oxyCODONE-acetaminophen (PERCOCET/ROXICET) 5-325 MG per tablet 1 tablet (1 tablet Oral Given 09/13/17 1016)     Initial Impression / Assessment and Plan / ED Course  I have reviewed the triage vital signs and the nursing notes.  Pertinent labs & imaging results that  were available during my care of the patient were reviewed by me and considered in my medical decision making (see chart for details).     Pt presenting with c/o left sided back pain.  No signs or symptoms of cauda equina, no fever to suggest epidural abscess.  Doubt UTI or ureteral stone.  Home meds were not helping with her symptoms, given IM med in the ED and advised to contine chronic pain meds.  Discharged with strict return precautions.  Pt agreeable with plan.  Final Clinical Impressions(s) / ED Diagnoses   Final diagnoses:  Acute left-sided low back pain without sciatica    ED Discharge Orders    None       Pixie Casino, MD 09/13/17 1316

## 2017-09-13 NOTE — ED Triage Notes (Signed)
Pt reports L upper abd/flank pain. No N/V/D.  She LWBS and has not checked back in. No labs since drawn last night.

## 2017-09-13 NOTE — Discharge Instructions (Signed)
Return to the ED with any concerns including weakness of legs, not able to urinate, fever/chills, decreased level of alertness/lethargy, or any other alarming symptoms

## 2017-09-16 DIAGNOSIS — G894 Chronic pain syndrome: Secondary | ICD-10-CM | POA: Diagnosis not present

## 2017-09-16 DIAGNOSIS — M542 Cervicalgia: Secondary | ICD-10-CM | POA: Diagnosis not present

## 2017-09-28 ENCOUNTER — Other Ambulatory Visit: Payer: Self-pay | Admitting: Rheumatology

## 2017-09-29 NOTE — Telephone Encounter (Signed)
Last Visit: 04/04/17 Next Visit: 12/21/17 Labs: 09/06/17 Elevated glucose TB Gold: 03/04/17 Neg  Okay to refill per Dr. Estanislado Pandy

## 2017-10-03 ENCOUNTER — Telehealth: Payer: Self-pay

## 2017-10-03 NOTE — Telephone Encounter (Signed)
Received a fax from [facility] regarding a prior authorization approval for [place] through 10/17/2018.   Reference number: [id number] Phone number:[phone]  Will send document to scan center.  Jasiyah Paulding, [place], CPhT  12:05 PM

## 2017-10-03 NOTE — Telephone Encounter (Signed)
Received a confirmation from pts insurance stating that the PA for pts medication will expire on 10/17/2017. Authorization was submitted via cover my meds. Will update once we receive a response.   Shelby Robertson, [place], CPhT 11:39 AM

## 2017-10-24 DIAGNOSIS — M79604 Pain in right leg: Secondary | ICD-10-CM | POA: Diagnosis not present

## 2017-10-24 DIAGNOSIS — G894 Chronic pain syndrome: Secondary | ICD-10-CM | POA: Diagnosis not present

## 2017-10-24 DIAGNOSIS — M79605 Pain in left leg: Secondary | ICD-10-CM | POA: Diagnosis not present

## 2017-10-24 DIAGNOSIS — M25511 Pain in right shoulder: Secondary | ICD-10-CM | POA: Diagnosis not present

## 2017-10-24 DIAGNOSIS — M542 Cervicalgia: Secondary | ICD-10-CM | POA: Diagnosis not present

## 2017-11-11 DIAGNOSIS — L0390 Cellulitis, unspecified: Secondary | ICD-10-CM | POA: Diagnosis not present

## 2017-11-11 DIAGNOSIS — F329 Major depressive disorder, single episode, unspecified: Secondary | ICD-10-CM | POA: Diagnosis not present

## 2017-11-11 DIAGNOSIS — S80262A Insect bite (nonvenomous), left knee, initial encounter: Secondary | ICD-10-CM | POA: Diagnosis not present

## 2017-11-23 DIAGNOSIS — M79622 Pain in left upper arm: Secondary | ICD-10-CM | POA: Diagnosis not present

## 2017-11-23 DIAGNOSIS — G894 Chronic pain syndrome: Secondary | ICD-10-CM | POA: Diagnosis not present

## 2017-11-23 DIAGNOSIS — M25512 Pain in left shoulder: Secondary | ICD-10-CM | POA: Diagnosis not present

## 2017-11-23 DIAGNOSIS — M79671 Pain in right foot: Secondary | ICD-10-CM | POA: Diagnosis not present

## 2017-11-23 DIAGNOSIS — M79602 Pain in left arm: Secondary | ICD-10-CM | POA: Diagnosis not present

## 2017-12-08 ENCOUNTER — Other Ambulatory Visit: Payer: Self-pay | Admitting: Nurse Practitioner

## 2017-12-08 DIAGNOSIS — Z09 Encounter for follow-up examination after completed treatment for conditions other than malignant neoplasm: Secondary | ICD-10-CM

## 2017-12-14 ENCOUNTER — Other Ambulatory Visit: Payer: Medicare Other

## 2017-12-15 ENCOUNTER — Ambulatory Visit: Admission: RE | Admit: 2017-12-15 | Payer: Medicare Other | Source: Ambulatory Visit

## 2017-12-15 ENCOUNTER — Ambulatory Visit
Admission: RE | Admit: 2017-12-15 | Discharge: 2017-12-15 | Disposition: A | Discharge status: Home / Self Care | Payer: Medicare Other | Source: Ambulatory Visit | Attending: Nurse Practitioner | Admitting: Nurse Practitioner

## 2017-12-15 DIAGNOSIS — R928 Other abnormal and inconclusive findings on diagnostic imaging of breast: Secondary | ICD-10-CM | POA: Diagnosis not present

## 2017-12-15 DIAGNOSIS — Z09 Encounter for follow-up examination after completed treatment for conditions other than malignant neoplasm: Secondary | ICD-10-CM

## 2017-12-20 ENCOUNTER — Other Ambulatory Visit: Payer: Self-pay | Admitting: *Deleted

## 2017-12-20 ENCOUNTER — Other Ambulatory Visit: Payer: Self-pay | Admitting: Nurse Practitioner

## 2017-12-20 DIAGNOSIS — R928 Other abnormal and inconclusive findings on diagnostic imaging of breast: Secondary | ICD-10-CM

## 2017-12-20 DIAGNOSIS — R234 Changes in skin texture: Secondary | ICD-10-CM

## 2017-12-21 ENCOUNTER — Ambulatory Visit: Payer: Medicare Other | Admitting: Rheumatology

## 2017-12-21 ENCOUNTER — Ambulatory Visit: Payer: Medicare Other | Admitting: Physician Assistant

## 2017-12-21 DIAGNOSIS — M79672 Pain in left foot: Secondary | ICD-10-CM | POA: Diagnosis not present

## 2017-12-21 DIAGNOSIS — M79604 Pain in right leg: Secondary | ICD-10-CM | POA: Diagnosis not present

## 2017-12-21 DIAGNOSIS — M79671 Pain in right foot: Secondary | ICD-10-CM | POA: Diagnosis not present

## 2017-12-21 DIAGNOSIS — G894 Chronic pain syndrome: Secondary | ICD-10-CM | POA: Diagnosis not present

## 2017-12-21 DIAGNOSIS — M79605 Pain in left leg: Secondary | ICD-10-CM | POA: Diagnosis not present

## 2017-12-23 ENCOUNTER — Ambulatory Visit (HOSPITAL_COMMUNITY)
Admission: EM | Admit: 2017-12-23 | Discharge: 2017-12-23 | Disposition: A | Discharge status: Home / Self Care | Payer: Medicare Other | Attending: Emergency Medicine | Admitting: Emergency Medicine

## 2017-12-23 ENCOUNTER — Encounter (HOSPITAL_COMMUNITY): Payer: Self-pay | Admitting: Emergency Medicine

## 2017-12-23 DIAGNOSIS — L309 Dermatitis, unspecified: Secondary | ICD-10-CM | POA: Diagnosis not present

## 2017-12-23 MED ORDER — MUPIROCIN 2 % EX OINT: 1.0000 "application " | TOPICAL_OINTMENT | Freq: Three times a day (TID) | CUTANEOUS | 0 refills | Status: DC

## 2017-12-23 MED ORDER — PREDNISONE 10 MG (21) PO TBPK: ORAL_TABLET | ORAL | 0 refills | Status: DC

## 2017-12-23 MED ORDER — TRIAMCINOLONE ACETONIDE 0.1 % EX CREA: 1.0000 "application " | TOPICAL_CREAM | Freq: Two times a day (BID) | CUTANEOUS | 0 refills | Status: DC

## 2017-12-23 NOTE — ED Triage Notes (Signed)
Pt sts rash on head and ears

## 2017-12-23 NOTE — Discharge Instructions (Signed)
6-day prednisone taper, Bactroban, lukewarm showers, CeraVe lotion after bathing and then as needed, triamcinolone cream.

## 2017-12-23 NOTE — ED Provider Notes (Signed)
HPI  SUBJECTIVE:  Shelby Robertson is a 47 y.o. female who presents with an itchy, painful rash, dry skin around her hairline on her forehead, neck, ears.  It has not changed or spread since the rash started.  She tried Vaseline with improvement in her symptoms.  No aggravating factors.  No fevers, erythema, crusting, swelling.  No new lotions, soaps, detergents, no exposure to chemicals.  She did not go to the hair salon recently.  No rash elsewhere.  No change in her medications.  She has a past medical history of hypertension, eczema, rheumatoid arthritis, fibromyalgia.  She is currently not on any steroids.  LMP: 2 months ago.  Denies the possibility of being pregnant.  States that she is irregular and states that we do not need to check.  WUJ:WJXBJ, [name]   Past Medical History:  Diagnosis Date  . Fibromyalgia   . Fibromyalgia   . Hypertension   . Osteoarthritis   . Rheumatoid arthritis(714.0)   . Rheumatoid arthritis(714.0)     History reviewed. No pertinent surgical history.  Family History  Problem Relation Age of Onset  . Heart attack Maternal Grandmother   . Stroke Maternal Grandmother     Social History   Tobacco Use  . Smoking status: Current Every Day Smoker    Packs/day: 0.50    Types: Cigarettes  . Smokeless tobacco: Never Used  Substance Use Topics  . Alcohol use: No  . Drug use: No    No current facility-administered medications for this encounter.   Current Outpatient Medications:  .  amLODipine (NORVASC) 10 MG tablet, Take 10 mg by mouth daily., Disp: , Rfl:  .  DULoxetine (CYMBALTA) 60 MG capsule, Take 60 mg by mouth daily.  , Disp: , Rfl:  .  folic acid (FOLVITE) 1 MG tablet, Take 2 tablets (2 mg total) by mouth daily., Disp: 180 tablet, Rfl: 4 .  furosemide (LASIX) 20 MG tablet, Take 1 tablet by mouth daily., Disp: , Rfl:  .  HUMIRA PEN 40 MG/0.8ML PNKT, INJECT 1 PEN INTO THE SKIN EVERY 14 DAYS, Disp: 3 each, Rfl: 0 .  hydrochlorothiazide  (HYDRODIURIL) 12.5 MG tablet, Take 12.5 tablets by mouth daily., Disp: , Rfl:  .  methotrexate 50 MG/2ML injection, INJECT 0.8 ML SUBCUTANEOUSLY ONCE A WEEK, Disp: 12 mL, Rfl: 0 .  metoprolol tartrate (LOPRESSOR) 50 MG tablet, TAKE 1 TABLET BY MOUTH 2 TIMES DAILY., Disp: 180 tablet, Rfl: 1 .  mupirocin ointment (BACTROBAN) 2 %, Apply 1 application topically 3 (three) times daily., Disp: 22 g, Rfl: 0 .  predniSONE (STERAPRED UNI-PAK 21 TAB) 10 MG (21) TBPK tablet, Dispense one 6 day pack. Take as directed with food., Disp: 21 tablet, Rfl: 0 .  tiZANidine (ZANAFLEX) 4 MG tablet, Take 4 mg by mouth every 6 (six) hours as needed (for muscle spasms). , Disp: , Rfl:  .  triamcinolone cream (KENALOG) 0.1 %, Apply 1 application topically 2 (two) times daily. Apply for 2 weeks. May use on face, Disp: 30 g, Rfl: 0 .  Tuberculin-Allergy Syringes 27G X 1/2" 1 ML MISC, Inject 1 Syringe into the skin once a week., Disp: 12 each, Rfl: 3 .  ZETIA 10 MG tablet, Take 1 tablet by mouth daily., Disp: , Rfl:   No Known Allergies   ROS  As noted in HPI.   Physical Exam  BP (!) 157/96 (BP Location: Right Arm)   Pulse 99   Temp 98 F (36.7 C) (Oral)   Resp  18   SpO2 99%   Constitutional: Well developed, well nourished, no acute distress Eyes:  EOMI, conjunctiva normal bilaterally HENT: Normocephalic, atraumatic,mucus membranes moist Respiratory: Normal inspiratory effort Cardiovascular: Normal rate GI: nondistended skin: Dry scaly nontender silvery skin behind both ears and along the neck With fissures.  No crusting, blisters.  See pictures.        Musculoskeletal: no deformities Neurologic: Alert & oriented x 3, no focal neuro deficits Psychiatric: Speech and behavior appropriate   ED Course   Medications - No data to display  No orders of the defined types were placed in this encounter.   No results found for this or any previous visit (from the past 24 hour(s)). No results  found.  ED Clinical Impression  Eczema, unspecified type   ED Assessment/Plan  Presentation consistent with eczema although this could be a contact dermatitis.  Unsure what it would be from his patient denies any exposure to chemicals, new lotions, soaps, detergents, shampoos.  Plan to send home with a 6-day prednisone taper, Bactroban to prevent secondary infection, lukewarm showers, CeraVe lotion, triamcinolone ointment.  Follow up with PMD if not better in 6 days.  Patient agrees with plan.  Meds ordered this encounter  Medications  . triamcinolone cream (KENALOG) 0.1 %    Sig: Apply 1 application topically 2 (two) times daily. Apply for 2 weeks. May use on face    Dispense:  30 g    Refill:  0  . mupirocin ointment (BACTROBAN) 2 %    Sig: Apply 1 application topically 3 (three) times daily.    Dispense:  22 g    Refill:  0  . predniSONE (STERAPRED UNI-PAK 21 TAB) 10 MG (21) TBPK tablet    Sig: Dispense one 6 day pack. Take as directed with food.    Dispense:  21 tablet    Refill:  0    *This clinic note was created using [employer]. Therefore, there may be occasional mistakes despite careful proofreading.   ?   Melynda Ripple, MD 12/24/17 1558

## 2017-12-26 DIAGNOSIS — R21 Rash and other nonspecific skin eruption: Secondary | ICD-10-CM | POA: Diagnosis not present

## 2017-12-26 DIAGNOSIS — L309 Dermatitis, unspecified: Secondary | ICD-10-CM | POA: Diagnosis not present

## 2018-01-06 DIAGNOSIS — Z0000 Encounter for general adult medical examination without abnormal findings: Secondary | ICD-10-CM | POA: Diagnosis not present

## 2018-01-06 DIAGNOSIS — E781 Pure hyperglyceridemia: Secondary | ICD-10-CM | POA: Diagnosis not present

## 2018-01-06 DIAGNOSIS — R7303 Prediabetes: Secondary | ICD-10-CM | POA: Diagnosis not present

## 2018-01-06 DIAGNOSIS — F329 Major depressive disorder, single episode, unspecified: Secondary | ICD-10-CM | POA: Diagnosis not present

## 2018-01-06 DIAGNOSIS — I10 Essential (primary) hypertension: Secondary | ICD-10-CM | POA: Diagnosis not present

## 2018-01-06 DIAGNOSIS — E559 Vitamin D deficiency, unspecified: Secondary | ICD-10-CM | POA: Diagnosis not present

## 2018-01-06 DIAGNOSIS — M797 Fibromyalgia: Secondary | ICD-10-CM | POA: Diagnosis not present

## 2018-01-11 ENCOUNTER — Other Ambulatory Visit: Payer: Self-pay | Admitting: *Deleted

## 2018-01-11 DIAGNOSIS — Z79899 Other long term (current) drug therapy: Secondary | ICD-10-CM | POA: Diagnosis not present

## 2018-01-12 LAB — CBC WITH DIFFERENTIAL/PLATELET
BASOS PCT: 0.8 %
Basophils Absolute: 72 cells/uL (ref 0–200)
Eosinophils Absolute: 153 cells/uL (ref 15–500)
Eosinophils Relative: 1.7 %
HEMATOCRIT: 41.2 % (ref 35.0–45.0)
HEMOGLOBIN: 13.9 g/dL (ref 11.7–15.5)
LYMPHS ABS: 4059 {cells}/uL — AB (ref 850–3900)
MCH: 28.6 pg (ref 27.0–33.0)
MCHC: 33.7 g/dL (ref 32.0–36.0)
MCV: 84.8 fL (ref 80.0–100.0)
MPV: 9.6 fL (ref 7.5–12.5)
Monocytes Relative: 6.3 %
Neutro Abs: 4149 cells/uL (ref 1500–7800)
Neutrophils Relative %: 46.1 %
Platelets: 407 10*3/uL — ABNORMAL HIGH (ref 140–400)
RBC: 4.86 10*6/uL (ref 3.80–5.10)
RDW: 14.2 % (ref 11.0–15.0)
Total Lymphocyte: 45.1 %
WBC: 9 10*3/uL (ref 3.8–10.8)
WBCMIX: 567 {cells}/uL (ref 200–950)

## 2018-01-12 LAB — COMPLETE METABOLIC PANEL WITH GFR
AG Ratio: 1 (calc) (ref 1.0–2.5)
ALBUMIN MSPROF: 4.2 g/dL (ref 3.6–5.1)
ALT: 30 U/L — ABNORMAL HIGH (ref 6–29)
AST: 22 U/L (ref 10–35)
Alkaline phosphatase (APISO): 101 U/L (ref 33–115)
BUN: 15 mg/dL (ref 7–25)
CO2: 29 mmol/L (ref 20–32)
CREATININE: 0.67 mg/dL (ref 0.50–1.10)
Calcium: 10.5 mg/dL — ABNORMAL HIGH (ref 8.6–10.2)
Chloride: 101 mmol/L (ref 98–110)
GFR, EST AFRICAN AMERICAN: 123 mL/min/{1.73_m2} (ref >=60)
GFR, EST NON AFRICAN AMERICAN: 106 mL/min/{1.73_m2} (ref >=60)
GLOBULIN: 4.2 g/dL — AB (ref 1.9–3.7)
GLUCOSE: 134 mg/dL — AB (ref 65–99)
Potassium: 4.7 mmol/L (ref 3.5–5.3)
SODIUM: 138 mmol/L (ref 135–146)
TOTAL PROTEIN: 8.4 g/dL — AB (ref 6.1–8.1)
Total Bilirubin: 0.3 mg/dL (ref 0.2–1.2)

## 2018-01-12 NOTE — Progress Notes (Signed)
Glucose is a still elevated.  Please notify patient and forward results to her PCP.  Please advise her not to see him supplement.  Patient has not been seen in the office since August.  She needs an appointment.  I will not be able to refill her medications without a visit.

## 2018-01-16 ENCOUNTER — Ambulatory Visit
Admission: RE | Admit: 2018-01-16 | Discharge: 2018-01-16 | Disposition: A | Discharge status: Home / Self Care | Payer: Medicare Other | Source: Ambulatory Visit | Attending: Nurse Practitioner | Admitting: Nurse Practitioner

## 2018-01-16 DIAGNOSIS — N6489 Other specified disorders of breast: Secondary | ICD-10-CM | POA: Diagnosis not present

## 2018-01-16 DIAGNOSIS — R234 Changes in skin texture: Secondary | ICD-10-CM

## 2018-01-16 DIAGNOSIS — R928 Other abnormal and inconclusive findings on diagnostic imaging of breast: Secondary | ICD-10-CM

## 2018-01-16 MED ORDER — GADOBENATE DIMEGLUMINE 529 MG/ML IV SOLN
20.0000 mL | Freq: Once | INTRAVENOUS | Status: AC | PRN
  Administered 2018-01-16: 20 mL via INTRAVENOUS

## 2018-01-18 DIAGNOSIS — G894 Chronic pain syndrome: Secondary | ICD-10-CM | POA: Diagnosis not present

## 2018-01-18 DIAGNOSIS — M25561 Pain in right knee: Secondary | ICD-10-CM | POA: Diagnosis not present

## 2018-01-18 DIAGNOSIS — M25571 Pain in right ankle and joints of right foot: Secondary | ICD-10-CM | POA: Diagnosis not present

## 2018-01-20 ENCOUNTER — Other Ambulatory Visit: Payer: Self-pay | Admitting: Surgery

## 2018-01-20 DIAGNOSIS — R590 Localized enlarged lymph nodes: Secondary | ICD-10-CM

## 2018-01-25 NOTE — Progress Notes (Signed)
Office Visit Note  Patient: Shelby Robertson             Date of Birth: [birth year]           MRN: [id number]             PCP: Minette Brine Referring: Minette Brine, FNP Visit Date: 01/30/2018 Occupation: @GUAROCC @    Subjective:  Left elbow stiffness    History of Present Illness: Shelby Robertson is a 47 y.o. female with history of seropositive rheumatoid arthritis and fibromyalgia.  Patient states that she continues to inject Humira every other week and injects methotrexate 0.8 mL weekly, takes folic acid 2 mg daily.  She needs refills of all of her medications.  She states that she has not had any recent rheumatoid arthritis flares.  She says she has been very happy with her methotrexate Humira combination.  She states that she has occasional discomfort in her right ankle and left elbow.  She states that she has noticed some stiffness in her left elbow but denies any joint swelling.  She states that her fibromyalgia has been very well controlled.  She states that her fatigue is improved as well.  She states that she continues to have insomnia and usually sleeps during the daytime.  She states that she is having some left sided trapezius tenderness and tension.       Activities of Daily Living:  Patient reports morning stiffness for 0  minutes.   Patient Denies nocturnal pain.  Difficulty dressing/grooming: Denies Difficulty climbing stairs: Denies Difficulty getting out of chair: Denies Difficulty using hands for taps, buttons, cutlery, and/or writing: Denies   Review of Systems  Constitutional: Negative for fatigue.  HENT: Negative for mouth sores, mouth dryness and nose dryness.   Eyes: Negative for pain, visual disturbance and dryness.  Respiratory: Negative for cough, hemoptysis, shortness of breath and difficulty breathing.   Cardiovascular: Negative for chest pain, palpitations, hypertension and swelling in legs/feet.  Gastrointestinal: Negative for blood in stool,  constipation and diarrhea.  Endocrine: Negative for increased urination.  Genitourinary: Negative for painful urination.  Musculoskeletal: Positive for arthralgias and joint pain. Negative for joint swelling, myalgias, muscle weakness, morning stiffness, muscle tenderness and myalgias.  Skin: Negative for color change, pallor, rash, hair loss, nodules/bumps, skin tightness, ulcers and sensitivity to sunlight.  Allergic/Immunologic: Negative for susceptible to infections.  Neurological: Negative for dizziness, numbness, headaches and weakness.  Hematological: Negative for swollen glands.  Psychiatric/Behavioral: Positive for depressed mood and sleep disturbance. The patient is nervous/anxious.     PMFS History:  Patient Active Problem List   Diagnosis Date Noted  . Fibromyalgia 07/27/2017  . Other fatigue 07/27/2017  . History of hidradenitis suppurativa 07/27/2017  . History of hypertension 07/27/2017  . History of hyperlipidemia 07/27/2017  . Smoker 07/27/2017  . High risk medication use 08/21/2016  . Rheumatoid arthritis of multiple sites without rheumatoid factor ([place]) 08/12/2016  . ARTHRITIS, GENERALIZED 07/14/2010  . UTI 10/27/2009  . FURUNCULOSIS 08/19/2009  . HYPERTENSION, BENIGN ESSENTIAL 03/04/2009  . POLYURIA 03/04/2009  . FIBROMYALGIA 11/09/2007  . HEADACHE 11/09/2007  . HIDRADENITIS SUPPURATIVA 07/11/2007  . DYSLIPIDEMIA 06/28/2007  . LEG EDEMA, BILATERAL 05/05/2007    Past Medical History:  Diagnosis Date  . Fibromyalgia   . Fibromyalgia   . Hypertension   . Osteoarthritis   . Rheumatoid arthritis(714.0)   . Rheumatoid arthritis(714.0)     Family History  Problem Relation Age of Onset  . Heart attack Maternal Grandmother   .  Stroke Maternal Grandmother    History reviewed. No pertinent surgical history. Social History   Social History Narrative  . Not on file     Objective: Vital Signs: BP 101/62 (BP Location: Left Arm, Patient Position: Sitting,  Cuff Size: Large)   Pulse 82   Resp 14   Ht 5\' 6"  (1.676 m)   Wt (!) 312 lb (141.5 kg)   BMI 50.36 kg/m    Physical Exam  Constitutional: She is oriented to person, place, and time. She appears well-developed and well-nourished.  HENT:  Head: Normocephalic and atraumatic.  Eyes: Conjunctivae and EOM are normal.  Neck: Normal range of motion.  Cardiovascular: Normal rate, regular rhythm, normal heart sounds and intact distal pulses.  Pulmonary/Chest: Effort normal and breath sounds normal.  Abdominal: Soft. Bowel sounds are normal.  Lymphadenopathy:    She has no cervical adenopathy.  Neurological: She is alert and oriented to person, place, and time.  Skin: Skin is warm and dry. Capillary refill takes less than 2 seconds.  Psychiatric: She has a normal mood and affect. Her behavior is normal.  Nursing note and vitals reviewed.    Musculoskeletal Exam: C-spine limited range of motion with lateral rotation to the left.  Thoracic and lumbar spine good range of motion.  No midline spinal tenderness.  No SI joint tenderness.  Shoulder joints, elbow joints, wrist joints, MCPs, PIPs, DIPs good range of motion with no synovitis.  She has synovial thickening of her left second and third MCP joints.  She has mild tenderness of her left elbow.  Hip joints, knee joints, ankle joints, MTPs, PIPs, DIPs good range of motion with no synovitis.  No warmth or effusion of bilateral knees.  She has bilateral knee crepitus.  No tenderness of trochanteric bursa.  Pedal edema bilaterally.  CDAI Exam: CDAI Homunculus Exam:   Joint Counts:  CDAI Tender Joint count: 0 CDAI Swollen Joint count: 0  Global Assessments:  Patient Global Assessment: 4 Provider Global Assessment: 4  CDAI Calculated Score: 8    Investigation: No additional findings. CBC Latest Ref Rng & Units 01/11/2018 09/12/2017 09/06/2017  WBC 3.8 - 10.8 Thousand/uL 9.0 7.3 8.3  Hemoglobin 11.7 - 15.5 g/dL 13.9 12.5 13.4  Hematocrit  35.0 - 45.0 % 41.2 37.3 39.6  Platelets 140 - 400 Thousand/uL 407(H) 359 426(H)   CMP Latest Ref Rng & Units 01/11/2018 09/12/2017 09/06/2017  Glucose 65 - 99 mg/dL 134(H) 126(H) 164(H)  BUN 7 - 25 mg/dL 15 9 8   Creatinine 0.50 - 1.10 mg/dL 0.67 0.62 0.62  Sodium 135 - 146 mmol/L 138 135 137  Potassium 3.5 - 5.3 mmol/L 4.7 3.8 4.6  Chloride 98 - 110 mmol/L 101 106 103  CO2 20 - 32 mmol/L 29 20(L) 27  Calcium 8.6 - 10.2 mg/dL 10.5(H) 8.9 9.3  Total Protein 6.1 - 8.1 g/dL 8.4(H) 8.1 8.0  Total Bilirubin 0.2 - 1.2 mg/dL 0.3 0.3 0.3  Alkaline Phos 38 - 126 U/L - 77 -  AST 10 - 35 U/L 22 15 11   ALT 6 - 29 U/L 30(H) 17 13    Imaging: Mr Breast Bilateral W Wo Contrast Inc Cad  Result Date: 01/17/2018 CLINICAL DATA:  RIGHT-sided breast issues for the past month, skin irritation, patient reports skin thickening but has improved. No nipple discharge. Recent diagnostic mammogram obtained on 12/15/2017 with clinical data as follows: Follow-up RIGHT breast skin thickening and trabecular thickening, presumed edema, refractory to a previous course of antibiotics. Patient describes  chronic bilateral lower leg swelling. LABS:  Not applicable EXAM: BILATERAL BREAST MRI WITH AND WITHOUT CONTRAST TECHNIQUE: Multiplanar, multisequence MR images of both breasts were obtained prior to and following the intravenous administration of 20 ml of MultiHance. THREE-DIMENSIONAL MR IMAGE RENDERING ON INDEPENDENT WORKSTATION: Three-dimensional MR images were rendered by post-processing of the original MR data on an independent workstation. The three-dimensional MR images were interpreted, and findings are reported in the following complete MRI report for this study. Three dimensional images were evaluated at the independent DynaCad workstation COMPARISON:  Diagnostic mammogram dated 12/15/2017, diagnostic mammogram dated 08/17/2017, diagnostic mammogram and ultrasound dated 01/12/2017 and screening mammogram dated 09/27/2016. No  previous breast MRI. FINDINGS: Breast composition: b. Scattered fibroglandular tissue. Background parenchymal enhancement: Minimal Right breast: There is diffuse skin thickening/edema, measuring up to 1.2 cm thickness, and mild-to-moderate superficial trabecular thickening/edema. There is no suspicious enhancing mass, non-mass enhancement or secondary signs of malignancy identified within the RIGHT breast. Left breast: No suspicious enhancing mass, non-mass enhancement or secondary signs of malignancy identified within the LEFT breast. Lymph nodes: Multiple enlarged and morphologically abnormal lymph nodes within the axillary regions bilaterally, RIGHT greater than LEFT. No enlarged or morphologically abnormal lymph nodes are identified within the internal mammary chain regions. Ancillary findings:  None. IMPRESSION: 1. Persistent diffuse RIGHT breast skin thickening/edema, measuring up to 1.2 cm thickness, and associated mild-to-moderate superficial trabecular thickening/edema, similar to appearance on multiple previous mammograms and ultrasound. Differential again includes asymmetric edema related to heart failure or renal failure, chronic mastitis, atypical dermatologic condition, and neoplastic process such as inflammatory breast cancer or breast lymphoma. Recommend dermatologic consultation for skin punch biopsy and/or further workup considerations. 2. No MRI evidence of malignancy within the RIGHT breast. 3. No MRI evidence of malignancy within the LEFT breast. 4. Multiple enlarged and morphologically abnormal lymph nodes within the bilateral axillae, RIGHT slightly greater than LEFT. This is likely related to patient's history of rheumatoid arthritis, however, neoplastic process such as lymphoma cannot be excluded. RECOMMENDATION: 1. Dermatology consultation for skin punch biopsy and/or further workup considerations for the persistent diffuse RIGHT breast skin thickening/edema (present since at least the time  of patient's screening mammogram performed on 09/27/2016). 2. Bilateral axillary ultrasound for enlarged/morphologically abnormal lymph nodes seen within each axilla, with possible ultrasound-guided core biopsy. BI-RADS CATEGORY  4: Suspicious. Electronically Signed   By: Franki Cabot M.D.   On: 01/17/2018 08:28    Speciality Comments: No specialty comments available.    Procedures:  Trigger Point Inj Date/Time: 01/30/2018 2:04 PM Performed by: Ofilia Neas, PA-C Authorized by: Ofilia Neas, PA-C   Consent Given by:  Patient Site marked: the procedure site was marked   Timeout: prior to procedure the correct patient, procedure, and site was verified   Indications:  Pain and muscle spasm Total # of Trigger Points:  1 Location: neck   Needle Size:  27 G Approach:  Dorsal Medications #1:  0.5 mL lidocaine 1 %; 10 mg triamcinolone acetonide 40 MG/ML Patient tolerance:  Patient tolerated the procedure well with no immediate complications   Allergies: Patient has no known allergies.   Assessment / Plan:     Visit Diagnoses: Rheumatoid arthritis of multiple sites without rheumatoid factor ([place]): She has no synovitis on exam.  She has not had any recent rheumatoid arthritis flares.  She is clinically been doing well on Humira every other week, methotrexate 0.8 mL weekly and folic acid 2 mg daily.   She has no joint  stiffness in the mornings.  She has been having increased pain in her left elbow and has tenderness on exam today.  She would like to continue on the current treatment regimen and not make any changes at this time.  She was advised to notify us if she does develop a flare.  A refill for Humira, methotrexate, and folic acid were sent to the pharmacy.  High risk medication use - Humira and MTX 0.8 ml weekly, folic acid 2 mg daily CBC and CMP: 01/11/18. TB gold: 04/04/17.  Standing orders for CBC, CMP and, TB gold were placed.  She will return in June and every 3 months to monitor for  drug toxicity.- Plan: QuantiFERON-TB Gold Plus  Fibromyalgia: Her fibromyalgia has been very well controlled.  She does not have generalized hyperalgesia on exam.  She is some tenderness of the left trapezius muscle.  I performed a cortisone trigger point injection today in the office.  She tolerated the procedure well.  She is advised to continue to work on neck exercises and she can use a heating pad if she is having muscle tension and tenderness in this region.  Her fatigue has improved significantly.  She continues to have insomnia and sleeps during the daytime.  Good sleep hygiene was discussed.  Other fatigue: Improved.  History of hypertension: Well-controlled in the office today.  She was advised to monitor her blood pressure closely following the cortisone injection today.  Other medical conditions are listed as follows:  History of hyperlipidemia  History of hidradenitis suppurativa    Orders: Orders Placed This Encounter  Procedures  . Trigger Point Inj  . QuantiFERON-TB Gold Plus   No orders of the defined types were placed in this encounter.   Face-to-face time spent with patient was 30 minutes. >50% of time was spent in counseling and coordination of care.  Follow-Up Instructions: Return in about 5 months (around 07/02/2018) for Rheumatoid arthritis, Fibromyalgia.   Ofilia Neas, PA-C  Note - This record has been created using Dragon software.  Chart creation errors have been sought, but may not always  have been located. Such creation errors do not reflect on  the standard of medical care.

## 2018-01-26 ENCOUNTER — Inpatient Hospital Stay
Admission: RE | Admit: 2018-01-26 | Discharge: 2018-01-26 | Disposition: A | Discharge status: Home / Self Care | Payer: Medicare Other | Source: Ambulatory Visit | Attending: Surgery | Admitting: Surgery

## 2018-01-26 ENCOUNTER — Inpatient Hospital Stay: Admission: RE | Admit: 2018-01-26 | Payer: Medicare Other | Source: Ambulatory Visit

## 2018-01-26 ENCOUNTER — Other Ambulatory Visit: Payer: Medicare Other

## 2018-01-30 ENCOUNTER — Ambulatory Visit (INDEPENDENT_AMBULATORY_CARE_PROVIDER_SITE_OTHER): Payer: Medicare Other | Admitting: Physician Assistant

## 2018-01-30 ENCOUNTER — Encounter: Payer: Self-pay | Admitting: Physician Assistant

## 2018-01-30 VITALS — BP 101/62 | HR 82 | Resp 14 | Ht 66.0 in | Wt 312.0 lb

## 2018-01-30 DIAGNOSIS — Z8639 Personal history of other endocrine, nutritional and metabolic disease: Secondary | ICD-10-CM

## 2018-01-30 DIAGNOSIS — Z8679 Personal history of other diseases of the circulatory system: Secondary | ICD-10-CM | POA: Diagnosis not present

## 2018-01-30 DIAGNOSIS — Z79899 Other long term (current) drug therapy: Secondary | ICD-10-CM | POA: Diagnosis not present

## 2018-01-30 DIAGNOSIS — M797 Fibromyalgia: Secondary | ICD-10-CM | POA: Diagnosis not present

## 2018-01-30 DIAGNOSIS — R5383 Other fatigue: Secondary | ICD-10-CM

## 2018-01-30 DIAGNOSIS — M62838 Other muscle spasm: Secondary | ICD-10-CM | POA: Diagnosis not present

## 2018-01-30 DIAGNOSIS — Z872 Personal history of diseases of the skin and subcutaneous tissue: Secondary | ICD-10-CM | POA: Diagnosis not present

## 2018-01-30 DIAGNOSIS — M0609 Rheumatoid arthritis without rheumatoid factor, multiple sites: Secondary | ICD-10-CM

## 2018-01-30 MED ORDER — LIDOCAINE HCL 1 % IJ SOLN
0.5000 mL | INTRAMUSCULAR | Status: AC | PRN
  Administered 2018-01-30: .5 mL

## 2018-01-30 MED ORDER — METHOTREXATE SODIUM CHEMO INJECTION 50 MG/2ML: INTRAMUSCULAR | 0 refills | Status: DC

## 2018-01-30 MED ORDER — FOLIC ACID 1 MG PO TABS: 2.0000 mg | ORAL_TABLET | Freq: Every day | ORAL | 2 refills | Status: DC

## 2018-01-30 MED ORDER — TRIAMCINOLONE ACETONIDE 40 MG/ML IJ SUSP
10.0000 mg | INTRAMUSCULAR | Status: AC | PRN
Start: 2018-01-30 — End: 2018-01-30
  Administered 2018-01-30: 10 mg via INTRAMUSCULAR

## 2018-01-30 MED ORDER — ADALIMUMAB 40 MG/0.4ML ~~LOC~~ AJKT: 40.0000 mg | AUTO-INJECTOR | SUBCUTANEOUS | 0 refills | Status: DC

## 2018-01-30 NOTE — Patient Instructions (Signed)
Standing Labs We placed an order today for your standing lab work.    Please come back and get your standing labs in June and every 3 months  We have open lab Monday through Friday from 8:30-11:30 AM and 1:30-4:00 PM  at the office of Dr. Shaili Deveshwar.   You may experience shorter wait times on Monday and Friday afternoons. The office is located at [address], [address], [place], [place] [address] No appointment is necessary.   Labs are drawn by [facility].  You may receive a bill from [facility] for your lab work. If you have any questions regarding directions or hours of operation,  please call [phone].    

## 2018-01-31 ENCOUNTER — Other Ambulatory Visit: Payer: Self-pay | Admitting: Surgery

## 2018-01-31 ENCOUNTER — Ambulatory Visit
Admission: RE | Admit: 2018-01-31 | Discharge: 2018-01-31 | Disposition: A | Discharge status: Home / Self Care | Payer: Medicare Other | Source: Ambulatory Visit | Attending: Surgery | Admitting: Surgery

## 2018-01-31 ENCOUNTER — Other Ambulatory Visit (HOSPITAL_COMMUNITY)
Admission: RE | Admit: 2018-01-31 | Discharge: 2018-01-31 | Disposition: A | Discharge status: Home / Self Care | Payer: Medicare Other | Source: Ambulatory Visit | Attending: Surgery | Admitting: Surgery

## 2018-01-31 DIAGNOSIS — R590 Localized enlarged lymph nodes: Secondary | ICD-10-CM

## 2018-01-31 DIAGNOSIS — N6489 Other specified disorders of breast: Secondary | ICD-10-CM | POA: Diagnosis not present

## 2018-02-01 MED FILL — METHOTREXATE 25 MG/ML VIAL: 50 | 84 days supply | Qty: 10 | Fill #0

## 2018-02-01 MED FILL — FOLIC ACID 1 MG TABLET: 1 | 90 days supply | Qty: 180 | Fill #0

## 2018-02-01 MED FILL — HUMIRA PEN 40 MG/0.4ML PNKT: 40 | 84 days supply | Qty: 6 | Fill #0

## 2018-02-20 DIAGNOSIS — M79604 Pain in right leg: Secondary | ICD-10-CM | POA: Diagnosis not present

## 2018-02-20 DIAGNOSIS — M79605 Pain in left leg: Secondary | ICD-10-CM | POA: Diagnosis not present

## 2018-02-20 DIAGNOSIS — M79672 Pain in left foot: Secondary | ICD-10-CM | POA: Diagnosis not present

## 2018-02-20 DIAGNOSIS — G894 Chronic pain syndrome: Secondary | ICD-10-CM | POA: Diagnosis not present

## 2018-02-20 DIAGNOSIS — M79671 Pain in right foot: Secondary | ICD-10-CM | POA: Diagnosis not present

## 2018-02-22 ENCOUNTER — Other Ambulatory Visit: Payer: Self-pay | Admitting: Surgery

## 2018-02-22 DIAGNOSIS — R590 Localized enlarged lymph nodes: Secondary | ICD-10-CM | POA: Diagnosis not present

## 2018-02-22 DIAGNOSIS — L905 Scar conditions and fibrosis of skin: Secondary | ICD-10-CM | POA: Diagnosis not present

## 2018-02-22 DIAGNOSIS — R21 Rash and other nonspecific skin eruption: Secondary | ICD-10-CM | POA: Diagnosis not present

## 2018-03-18 ENCOUNTER — Other Ambulatory Visit: Payer: Self-pay

## 2018-03-18 ENCOUNTER — Ambulatory Visit (HOSPITAL_COMMUNITY)
Admission: EM | Admit: 2018-03-18 | Discharge: 2018-03-18 | Disposition: A | Discharge status: Home / Self Care | Payer: Medicare Other | Attending: Internal Medicine | Admitting: Internal Medicine

## 2018-03-18 ENCOUNTER — Encounter (HOSPITAL_COMMUNITY): Payer: Self-pay | Admitting: Emergency Medicine

## 2018-03-18 DIAGNOSIS — M545 Low back pain, unspecified: Secondary | ICD-10-CM

## 2018-03-18 DIAGNOSIS — M1990 Unspecified osteoarthritis, unspecified site: Secondary | ICD-10-CM | POA: Diagnosis not present

## 2018-03-18 DIAGNOSIS — I10 Essential (primary) hypertension: Secondary | ICD-10-CM | POA: Insufficient documentation

## 2018-03-18 DIAGNOSIS — Z7984 Long term (current) use of oral hypoglycemic drugs: Secondary | ICD-10-CM | POA: Insufficient documentation

## 2018-03-18 DIAGNOSIS — F17210 Nicotine dependence, cigarettes, uncomplicated: Secondary | ICD-10-CM | POA: Insufficient documentation

## 2018-03-18 DIAGNOSIS — Z79899 Other long term (current) drug therapy: Secondary | ICD-10-CM | POA: Insufficient documentation

## 2018-03-18 DIAGNOSIS — M797 Fibromyalgia: Secondary | ICD-10-CM | POA: Insufficient documentation

## 2018-03-18 DIAGNOSIS — E785 Hyperlipidemia, unspecified: Secondary | ICD-10-CM | POA: Insufficient documentation

## 2018-03-18 DIAGNOSIS — M069 Rheumatoid arthritis, unspecified: Secondary | ICD-10-CM | POA: Insufficient documentation

## 2018-03-18 DIAGNOSIS — G8929 Other chronic pain: Secondary | ICD-10-CM | POA: Insufficient documentation

## 2018-03-18 DIAGNOSIS — R300 Dysuria: Secondary | ICD-10-CM

## 2018-03-18 LAB — POCT URINALYSIS DIP (DEVICE)
Bilirubin Urine: NEGATIVE
Glucose, UA: NEGATIVE mg/dL
KETONES UR: NEGATIVE mg/dL
Nitrite: NEGATIVE
PROTEIN: NEGATIVE mg/dL
SPECIFIC GRAVITY, URINE: 1.015 (ref 1.005–1.030)
UROBILINOGEN UA: 0.2 mg/dL (ref 0.0–1.0)
pH: 5.5 (ref 5.0–8.0)

## 2018-03-18 MED ORDER — CEPHALEXIN 500 MG PO CAPS: 500.0000 mg | ORAL_CAPSULE | Freq: Two times a day (BID) | ORAL | 0 refills | Status: AC

## 2018-03-18 MED ORDER — NITROFURANTOIN MONOHYD MACRO 100 MG PO CAPS: 100.0000 mg | ORAL_CAPSULE | Freq: Two times a day (BID) | ORAL | 0 refills | Status: DC

## 2018-03-18 NOTE — ED Provider Notes (Signed)
[place]    CSN: [id number] Arrival date & time: 03/18/18  1914     History   Chief Complaint Chief Complaint  Patient presents with  . Back Pain    HPI Shelby Robertson is a 47 y.o. female.   Shelby Robertson presents with complaints of low back pain which started 5 days ago. No known injury. Worse if standing upright. No known fevers. Worse with urination and feels she has had pain with urination and some frequency. States she saw blood to her urine once in the past week. No nausea, vomiting or diarrhea. She follows with pain management. She taking hydrocodone for chronic pain. Hx of RA, OA, fibromyalgia. States she has had similar pain with kidney issues in the past. Without weakness, numbness or tingling to legs or feet. Ambulatory.     ROS per HPI.      Past Medical History:  Diagnosis Date  . Fibromyalgia   . Fibromyalgia   . Hypertension   . Osteoarthritis   . Rheumatoid arthritis(714.0)   . Rheumatoid arthritis(714.0)     Patient Active Problem List   Diagnosis Date Noted  . Fibromyalgia 07/27/2017  . Other fatigue 07/27/2017  . History of hidradenitis suppurativa 07/27/2017  . History of hypertension 07/27/2017  . History of hyperlipidemia 07/27/2017  . Smoker 07/27/2017  . High risk medication use 08/21/2016  . Rheumatoid arthritis of multiple sites without rheumatoid factor ([place]) 08/12/2016  . ARTHRITIS, GENERALIZED 07/14/2010  . UTI 10/27/2009  . FURUNCULOSIS 08/19/2009  . HYPERTENSION, BENIGN ESSENTIAL 03/04/2009  . POLYURIA 03/04/2009  . FIBROMYALGIA 11/09/2007  . HEADACHE 11/09/2007  . HIDRADENITIS SUPPURATIVA 07/11/2007  . DYSLIPIDEMIA 06/28/2007  . LEG EDEMA, BILATERAL 05/05/2007    History reviewed. No pertinent surgical history.  OB History   None      Home Medications    Prior to Admission medications   Medication Sig Start Date End Date Taking? Authorizing Provider  Adalimumab (HUMIRA PEN) 40 MG/0.4ML PNKT Inject 40 mg  into the skin every 14 (fourteen) days. 01/30/18   Ofilia Neas, PA-C  amLODipine (NORVASC) 10 MG tablet Take 10 mg by mouth daily.    [provider]  DULoxetine (CYMBALTA) 60 MG capsule Take 60 mg by mouth daily.      [provider]  folic acid (FOLVITE) 1 MG tablet Take 2 tablets (2 mg total) by mouth daily. 01/30/18 06/14/18  Ofilia Neas, PA-C  furosemide (LASIX) 20 MG tablet Take 1 tablet by mouth daily. 01/20/15   [provider]  HUMIRA PEN 97 MG/0.8ML PNKT INJECT 1 PEN INTO THE SKIN EVERY 14 DAYS 09/29/17   Bo Merino, MD  hydrochlorothiazide (HYDRODIURIL) 12.5 MG tablet Take 12.5 tablets by mouth daily. 07/18/17   [provider]  HYDROcodone-acetaminophen [facility]) 10-325 MG tablet  01/28/18   [provider]  metFORMIN (GLUCOPHAGE) 500 MG tablet  01/09/18   [provider]  methotrexate 50 MG/2ML injection INJECT 0.8 ML SUBCUTANEOUSLY ONCE A WEEK 01/30/18   Ofilia Neas, PA-C  metoprolol tartrate (LOPRESSOR) 50 MG tablet TAKE 1 TABLET BY MOUTH 2 TIMES DAILY. 05/24/17   Skeet Latch, MD  mupirocin ointment (BACTROBAN) 2 % Apply 1 application topically 3 (three) times daily. 12/23/17   Melynda Ripple, MD  nitrofurantoin, macrocrystal-monohydrate, (MACROBID) 100 MG capsule Take 1 capsule (100 mg total) by mouth 2 (two) times daily for 5 days. 03/18/18 03/23/18  Zigmund Gottron, NP  tiZANidine (ZANAFLEX) 4 MG tablet Take 4 mg by  mouth every 6 (six) hours as needed (for muscle spasms).     [provider]  triamcinolone cream (KENALOG) 0.1 % Apply 1 application topically 2 (two) times daily. Apply for 2 weeks. May use on face 12/23/17   Melynda Ripple, MD  Tuberculin-Allergy Syringes 27G X 1/2" 1 ML MISC Inject 1 Syringe into the skin once a week. 10/28/16   Bo Merino, MD  ZETIA 10 MG tablet Take 1 tablet by mouth daily. 12/18/14   [provider]    Family History Family History  Problem Relation Age of  Onset  . Heart attack Maternal Grandmother   . Stroke Maternal Grandmother     Social History Social History   Tobacco Use  . Smoking status: Current Every Day Smoker    Packs/day: 0.50    Types: Cigarettes  . Smokeless tobacco: Never Used  Substance Use Topics  . Alcohol use: Not Currently  . Drug use: Not Currently     Allergies   Patient has no known allergies.   Review of Systems Review of Systems   Physical Exam Triage Vital Signs ED Triage Vitals  Enc Vitals Group     BP 03/18/18 2107 105/69     Pulse Rate 03/18/18 2107 95     Resp 03/18/18 2107 16     Temp 03/18/18 2107 98.3 F (36.8 C)     Temp Source 03/18/18 2107 Oral     SpO2 03/18/18 2107 100 %     Weight --      Height --      Head Circumference --      Peak Flow --      Pain Score 03/18/18 2110 8     Pain Loc --      Pain Edu? --      Excl. in [place]? --    No data found.  Updated Vital Signs BP 105/69 (BP Location: Left Arm)   Pulse 95   Temp 98.3 F (36.8 C) (Oral)   Resp 16   SpO2 100%    Physical Exam  Constitutional: She is oriented to person, place, and time. She appears well-developed and well-nourished. No distress.  Cardiovascular: Normal rate, regular rhythm and normal heart sounds.  Pulmonary/Chest: Effort normal and breath sounds normal.  Abdominal: Soft. There is no tenderness. There is no rigidity, no rebound, no guarding and no CVA tenderness.  Musculoskeletal:       Lumbar back: She exhibits tenderness and pain. She exhibits normal range of motion, no bony tenderness, no swelling, no edema, no deformity, no laceration, no spasm and normal pulse.       Back:  Minimal tenderness on palpation to generalized low back. Ambulatory without difficulty; strength equal to bilateral lower extremities and sensation intact  Neurological: She is alert and oriented to person, place, and time.  Skin: Skin is warm and dry.     UC Treatments / Results  Labs (all labs ordered are  listed, but only abnormal results are displayed) Labs Reviewed  POCT URINALYSIS DIP (DEVICE) - Abnormal; Notable for the following components:      Result Value   Hgb urine dipstick LARGE (*)    Leukocytes, UA TRACE (*)    All other components within normal limits  URINE CULTURE    EKG None  Radiology No results found.  Procedures Procedures (including critical care time)  Medications Ordered in UC Medications - No data to display  Initial Impression / Assessment and Plan / UC  Course  I have reviewed the triage vital signs and the nursing notes.  Pertinent labs & imaging results that were available during my care of the patient were reviewed by me and considered in my medical decision making (see chart for details).     hgb and trace leuks to urine dip today as well as some symptoms of dysuria as well as back pain. Course of keflex initiated. Urine culture collected and pending. Follow up for recheck with PCP and/or pain management. Return precautions provided. Patient verbalized understanding and agreeable to plan.  Ambulatory out of clinic without difficulty.    Final Clinical Impressions(s) / UC Diagnoses   Final diagnoses:  Bilateral low back pain without sciatica, unspecified chronicity  Dysuria     Discharge Instructions     Your urine is concerning for uti so I will start antibiotic treatment at this time. Complete course of antibiotics.  I have sent your urine to be cultured and we will call you with any changes to medications if indicated. Increase fluid intake to empty bladder regularly. Light and regular activity as tolerated. Continue to follow with your primary care provider and pain management for long term pain management.     ED Prescriptions    Medication Sig Dispense Auth. Provider   nitrofurantoin, macrocrystal-monohydrate, (MACROBID) 100 MG capsule Take 1 capsule (100 mg total) by mouth 2 (two) times daily for 5 days. 10 capsule Zigmund Gottron, NP     Controlled Substance Prescriptions [place] Controlled Substance Registry consulted? Not Applicable   Zigmund Gottron, NP 03/18/18 2152

## 2018-03-18 NOTE — Discharge Instructions (Signed)
Your urine is concerning for uti so I will start antibiotic treatment at this time. Complete course of antibiotics.  I have sent your urine to be cultured and we will call you with any changes to medications if indicated. Increase fluid intake to empty bladder regularly. Light and regular activity as tolerated. Continue to follow with your primary care provider and pain management for long term pain management.

## 2018-03-18 NOTE — ED Triage Notes (Signed)
The patient presented to the [facility] with a complaint of lower back pain that started after waking up from a nap. The patient denied any known injury.

## 2018-03-20 LAB — URINE CULTURE

## 2018-03-22 DIAGNOSIS — M79604 Pain in right leg: Secondary | ICD-10-CM | POA: Diagnosis not present

## 2018-03-22 DIAGNOSIS — M25512 Pain in left shoulder: Secondary | ICD-10-CM | POA: Diagnosis not present

## 2018-03-22 DIAGNOSIS — M542 Cervicalgia: Secondary | ICD-10-CM | POA: Diagnosis not present

## 2018-03-22 DIAGNOSIS — G894 Chronic pain syndrome: Secondary | ICD-10-CM | POA: Diagnosis not present

## 2018-03-22 DIAGNOSIS — M79671 Pain in right foot: Secondary | ICD-10-CM | POA: Diagnosis not present

## 2018-03-22 DIAGNOSIS — M25561 Pain in right knee: Secondary | ICD-10-CM | POA: Diagnosis not present

## 2018-04-19 DIAGNOSIS — M79605 Pain in left leg: Secondary | ICD-10-CM | POA: Diagnosis not present

## 2018-04-19 DIAGNOSIS — M79671 Pain in right foot: Secondary | ICD-10-CM | POA: Diagnosis not present

## 2018-04-19 DIAGNOSIS — M79672 Pain in left foot: Secondary | ICD-10-CM | POA: Diagnosis not present

## 2018-04-19 DIAGNOSIS — M25571 Pain in right ankle and joints of right foot: Secondary | ICD-10-CM | POA: Diagnosis not present

## 2018-04-19 DIAGNOSIS — M79604 Pain in right leg: Secondary | ICD-10-CM | POA: Diagnosis not present

## 2018-04-19 DIAGNOSIS — M25561 Pain in right knee: Secondary | ICD-10-CM | POA: Diagnosis not present

## 2018-04-19 DIAGNOSIS — G894 Chronic pain syndrome: Secondary | ICD-10-CM | POA: Diagnosis not present

## 2018-04-24 ENCOUNTER — Other Ambulatory Visit: Payer: Self-pay | Admitting: Physician Assistant

## 2018-04-24 NOTE — Telephone Encounter (Signed)
Thank you informing me.  Ok to refill 90-supply once labs are drawn.

## 2018-04-24 NOTE — Telephone Encounter (Signed)
Last visit: 01/30/2018 Next visit: 07/03/2018 Labs: 01/11/2018 elevated glucose  TB gold: 04/04/2017 Negative   Advised patient she is due to update labs, patient states she will come get labs drawn tomorrow. We will then send in a 90 day supply. Patient verbalized understanding.

## 2018-04-27 ENCOUNTER — Other Ambulatory Visit: Payer: Self-pay

## 2018-04-27 DIAGNOSIS — Z79899 Other long term (current) drug therapy: Secondary | ICD-10-CM | POA: Diagnosis not present

## 2018-04-29 LAB — CBC WITH DIFFERENTIAL/PLATELET
BASOS ABS: 51 {cells}/uL (ref 0–200)
Basophils Relative: 0.6 %
EOS PCT: 1.4 %
Eosinophils Absolute: 119 cells/uL (ref 15–500)
HEMATOCRIT: 41.3 % (ref 35.0–45.0)
HEMOGLOBIN: 13.7 g/dL (ref 11.7–15.5)
LYMPHS ABS: 3332 {cells}/uL (ref 850–3900)
MCH: 29.1 pg (ref 27.0–33.0)
MCHC: 33.2 g/dL (ref 32.0–36.0)
MCV: 87.9 fL (ref 80.0–100.0)
MPV: 9.2 fL (ref 7.5–12.5)
Monocytes Relative: 4.7 %
NEUTROS ABS: 4599 {cells}/uL (ref 1500–7800)
Neutrophils Relative %: 54.1 %
Platelets: 506 10*3/uL — ABNORMAL HIGH (ref 140–400)
RBC: 4.7 10*6/uL (ref 3.80–5.10)
RDW: 13.3 % (ref 11.0–15.0)
Total Lymphocyte: 39.2 %
WBC: 8.5 10*3/uL (ref 3.8–10.8)
WBCMIX: 400 {cells}/uL (ref 200–950)

## 2018-04-29 LAB — QUANTIFERON-TB GOLD PLUS
Mitogen-NIL: >10 IU/mL
NIL: 0.04 [IU]/mL
QuantiFERON-TB Gold Plus: NEGATIVE
TB2-NIL: 0.01 [IU]/mL

## 2018-04-29 LAB — COMPLETE METABOLIC PANEL WITH GFR
AG Ratio: 0.9 (calc) — ABNORMAL LOW (ref 1.0–2.5)
ALKALINE PHOSPHATASE (APISO): 89 U/L (ref 33–115)
ALT: 12 U/L (ref 6–29)
AST: 12 U/L (ref 10–35)
Albumin: 4.1 g/dL (ref 3.6–5.1)
BUN: 9 mg/dL (ref 7–25)
CHLORIDE: 103 mmol/L (ref 98–110)
CO2: 23 mmol/L (ref 20–32)
Calcium: 9.7 mg/dL (ref 8.6–10.2)
Creat: 0.59 mg/dL (ref 0.50–1.10)
GFR, EST AFRICAN AMERICAN: 128 mL/min/{1.73_m2} (ref >=60)
GFR, Est Non African American: 111 mL/min/{1.73_m2} (ref >=60)
GLUCOSE: 185 mg/dL — AB (ref 65–99)
Globulin: 4.7 g/dL (calc) — ABNORMAL HIGH (ref 1.9–3.7)
Potassium: 4.3 mmol/L (ref 3.5–5.3)
Sodium: 134 mmol/L — ABNORMAL LOW (ref 135–146)
TOTAL PROTEIN: 8.8 g/dL — AB (ref 6.1–8.1)
Total Bilirubin: 0.3 mg/dL (ref 0.2–1.2)

## 2018-05-01 NOTE — Progress Notes (Signed)
stable °

## 2018-05-01 NOTE — Progress Notes (Signed)
TB gold negative

## 2018-05-02 ENCOUNTER — Other Ambulatory Visit: Payer: Self-pay | Admitting: Rheumatology

## 2018-05-03 NOTE — Telephone Encounter (Signed)
Last visit: 01/30/2018 Next visit: 07/03/2018 Labs: 04/27/18 stable TB Gold: 04/27/18 Neg   Okay to refill per Dr. Estanislado Pandy

## 2018-05-19 MED FILL — HUMIRA PEN 40 MG/0.4ML PNKT: 40 | 84 days supply | Qty: 6 | Fill #0

## 2018-05-23 DIAGNOSIS — M79671 Pain in right foot: Secondary | ICD-10-CM | POA: Diagnosis not present

## 2018-05-23 DIAGNOSIS — M25512 Pain in left shoulder: Secondary | ICD-10-CM | POA: Diagnosis not present

## 2018-05-23 DIAGNOSIS — M542 Cervicalgia: Secondary | ICD-10-CM | POA: Diagnosis not present

## 2018-05-23 DIAGNOSIS — M79604 Pain in right leg: Secondary | ICD-10-CM | POA: Diagnosis not present

## 2018-05-23 DIAGNOSIS — M25521 Pain in right elbow: Secondary | ICD-10-CM | POA: Diagnosis not present

## 2018-05-23 DIAGNOSIS — M79605 Pain in left leg: Secondary | ICD-10-CM | POA: Diagnosis not present

## 2018-05-23 DIAGNOSIS — G894 Chronic pain syndrome: Secondary | ICD-10-CM | POA: Diagnosis not present

## 2018-05-23 DIAGNOSIS — M79672 Pain in left foot: Secondary | ICD-10-CM | POA: Diagnosis not present

## 2018-05-23 DIAGNOSIS — M25571 Pain in right ankle and joints of right foot: Secondary | ICD-10-CM | POA: Diagnosis not present

## 2018-05-23 DIAGNOSIS — M25561 Pain in right knee: Secondary | ICD-10-CM | POA: Diagnosis not present

## 2018-05-23 MED FILL — FOLIC ACID 1 MG TABS: 1 | 45 days supply | Qty: 90 | Fill #1

## 2018-05-23 MED FILL — METHOTREXATE 25 MG/ML VIAL: 50 | 84 days supply | Qty: 10 | Fill #0

## 2018-05-23 MED FILL — METOPROLOL TARTRATE 50 MG T: 50 | 90 days supply | Qty: 180 | Fill #1

## 2018-06-15 ENCOUNTER — Encounter (HOSPITAL_COMMUNITY): Payer: Self-pay

## 2018-06-15 ENCOUNTER — Ambulatory Visit (HOSPITAL_COMMUNITY)
Admission: EM | Admit: 2018-06-15 | Discharge: 2018-06-15 | Disposition: A | Discharge status: Home / Self Care | Payer: Medicare Other | Attending: Family Medicine | Admitting: Family Medicine

## 2018-06-15 ENCOUNTER — Other Ambulatory Visit: Payer: Self-pay

## 2018-06-15 DIAGNOSIS — L0291 Cutaneous abscess, unspecified: Secondary | ICD-10-CM

## 2018-06-15 MED ORDER — DOXYCYCLINE HYCLATE 100 MG PO CAPS: 100.0000 mg | ORAL_CAPSULE | Freq: Two times a day (BID) | ORAL | 0 refills | Status: AC

## 2018-06-15 NOTE — ED Provider Notes (Signed)
[place]    CSN: [id number] Arrival date & time: 06/15/18  1404     History   Chief Complaint Chief Complaint  Patient presents with  . Recurrent Skin Infections    HPI Shelby Robertson is a 47 y.o. female.   Shelby Robertson presents with complaints of draining abscess to her left posterior inner thigh. Noticed it approximately 8/24 originally. It increased in size and tenderness. Began draining yesterday. No fevers or chills. No injury to the leg, no numbness or tingling. Hx of HS. Has had multiple similar in the past. No specific known history of MRSA. Has seen dermatology in the past. Follows with her PCP as well. Hx of DM, RA, fibromyalgia, OA, htn. Uses humira and methotrexate.    ROS per HPI.      Past Medical History:  Diagnosis Date  . Fibromyalgia   . Fibromyalgia   . Hypertension   . Osteoarthritis   . Rheumatoid arthritis(714.0)   . Rheumatoid arthritis(714.0)     Patient Active Problem List   Diagnosis Date Noted  . Fibromyalgia 07/27/2017  . Other fatigue 07/27/2017  . History of hidradenitis suppurativa 07/27/2017  . History of hypertension 07/27/2017  . History of hyperlipidemia 07/27/2017  . Smoker 07/27/2017  . High risk medication use 08/21/2016  . Rheumatoid arthritis of multiple sites without rheumatoid factor ([place]) 08/12/2016  . ARTHRITIS, GENERALIZED 07/14/2010  . UTI 10/27/2009  . FURUNCULOSIS 08/19/2009  . HYPERTENSION, BENIGN ESSENTIAL 03/04/2009  . POLYURIA 03/04/2009  . FIBROMYALGIA 11/09/2007  . HEADACHE 11/09/2007  . HIDRADENITIS SUPPURATIVA 07/11/2007  . DYSLIPIDEMIA 06/28/2007  . LEG EDEMA, BILATERAL 05/05/2007    History reviewed. No pertinent surgical history.  OB History   None      Home Medications    Prior to Admission medications   Medication Sig Start Date End Date Taking? Authorizing Provider  amLODipine (NORVASC) 10 MG tablet Take 10 mg by mouth daily.    [provider]  doxycycline  (VIBRAMYCIN) 100 MG capsule Take 1 capsule (100 mg total) by mouth 2 (two) times daily for 7 days. 06/15/18 06/22/18  Zigmund Gottron, NP  DULoxetine (CYMBALTA) 60 MG capsule Take 60 mg by mouth daily.      [provider]  folic acid (FOLVITE) 1 MG tablet Take 2 tablets (2 mg total) by mouth daily. 01/30/18 06/14/18  Ofilia Neas, PA-C  furosemide (LASIX) 20 MG tablet Take 1 tablet by mouth daily. 01/20/15   [provider]  HUMIRA PEN 68 MG/0.4ML PNKT INJECT 40 MG INTO THE SKIN EVERY 14 DAYS. 05/03/18   Deveshwar, Abel Presto, MD  HUMIRA PEN 68 MG/0.8ML PNKT INJECT 1 PEN INTO THE SKIN EVERY 14 DAYS 09/29/17   Bo Merino, MD  hydrochlorothiazide (HYDRODIURIL) 12.5 MG tablet Take 12.5 tablets by mouth daily. 07/18/17   [provider]  HYDROcodone-acetaminophen [facility]) 10-325 MG tablet  01/28/18   [provider]  metFORMIN (GLUCOPHAGE) 500 MG tablet  01/09/18   [provider]  methotrexate 50 MG/2ML injection INJECT 0.8 ML SUBCUTANEOUSLY ONCE A WEEK 01/30/18   Ofilia Neas, PA-C  metoprolol tartrate (LOPRESSOR) 50 MG tablet TAKE 1 TABLET BY MOUTH 2 TIMES DAILY. 05/24/17   Skeet Latch, MD  mupirocin ointment (BACTROBAN) 2 % Apply 1 application topically 3 (three) times daily. 12/23/17   Melynda Ripple, MD  tiZANidine (ZANAFLEX) 4 MG tablet Take 4 mg by mouth every 6 (six) hours as needed (for muscle spasms).     [provider]  triamcinolone cream (KENALOG) 0.1 % Apply 1 application topically 2 (two) times daily. Apply for 2 weeks. May use on face 12/23/17   Melynda Ripple, MD  Tuberculin-Allergy Syringes 27G X 1/2" 1 ML MISC Inject 1 Syringe into the skin once a week. 10/28/16   Bo Merino, MD  ZETIA 10 MG tablet Take 1 tablet by mouth daily. 12/18/14   [provider]    Family History Family History  Problem Relation Age of Onset  . Heart attack Maternal Grandmother   . Stroke Maternal Grandmother     Social  History Social History   Tobacco Use  . Smoking status: Current Every Day Smoker    Packs/day: 0.50    Types: Cigarettes  . Smokeless tobacco: Never Used  Substance Use Topics  . Alcohol use: Not Currently  . Drug use: Not Currently     Allergies   Patient has no known allergies.   Review of Systems Review of Systems   Physical Exam Triage Vital Signs ED Triage Vitals  Enc Vitals Group     BP --      Pulse Rate 06/15/18 1422 94     Resp 06/15/18 1422 18     Temp 06/15/18 1422 98.2 F (36.8 C)     Temp src --      SpO2 06/15/18 1422 100 %     Weight 06/15/18 1418 300 lb (136.1 kg)     Height --      Head Circumference --      Peak Flow --      Pain Score 06/15/18 1422 5     Pain Loc --      Pain Edu? --      Excl. in [place]? --    No data found.  Updated Vital Signs Pulse 94   Temp 98.2 F (36.8 C)   Resp 18   Wt 300 lb (136.1 kg)   SpO2 100%   BMI 48.42 kg/m    Physical Exam  Constitutional: She is oriented to person, place, and time. She appears well-developed and well-nourished. No distress.  Cardiovascular: Normal rate, regular rhythm and normal heart sounds.  Pulmonary/Chest: Effort normal and breath sounds normal.  Neurological: She is alert and oriented to person, place, and time.  Skin: Skin is warm and dry.     Draining abscess to left posterior inner thigh; no induration; no longer raised; soft with minimal fluctuance, significant drainage with any palpation; serosanguinous drainage; draining from two areas; entirety of affected skin approximately 2 cm      UC Treatments / Results  Labs (all labs ordered are listed, but only abnormal results are displayed) Labs Reviewed - No data to display  EKG None  Radiology No results found.  Procedures Procedures (including critical care time)  Medications Ordered in UC Medications - No data to display  Initial Impression / Assessment and Plan / UC Course  I have reviewed the triage vital  signs and the nursing notes.  Pertinent labs & imaging results that were available during my care of the patient were reviewed by me and considered in my medical decision making (see chart for details).     Draining well already, not raised, firm or indurated. 7 days of doxy provided at this time to cover for infection. Warm soaks/compresses encouraged to promote further drainage to allow for healing. If symptoms worsen or do not improve in the next week to return to be seen or to follow up with PCP.  Patient verbalized understanding and agreeable to plan.    Final Clinical Impressions(s) / UC Diagnoses   Final diagnoses:  Abscess     Discharge Instructions     Your abscess is draining nicely today, I did not see a reason to open it further.  Warm compresses/ soaks to promote further drainage.  Complete course of antibiotics.   If symptoms worsen or do not improve in the next week to return to be seen or to follow up with your PCP.     ED Prescriptions    Medication Sig Dispense Auth. Provider   doxycycline (VIBRAMYCIN) 100 MG capsule Take 1 capsule (100 mg total) by mouth 2 (two) times daily for 7 days. 14 capsule Zigmund Gottron, NP     Controlled Substance Prescriptions [place] Controlled Substance Registry consulted? Not Applicable   Zigmund Gottron, NP 06/15/18 1455

## 2018-06-15 NOTE — Discharge Instructions (Signed)
Your abscess is draining nicely today, I did not see a reason to open it further.  Warm compresses/ soaks to promote further drainage.  Complete course of antibiotics.   If symptoms worsen or do not improve in the next week to return to be seen or to follow up with your PCP.

## 2018-06-15 NOTE — ED Triage Notes (Signed)
Pt states she has a boil on her left thigh upper part x 5 day

## 2018-06-20 NOTE — Progress Notes (Signed)
Office Visit Note  Patient: Shelby Robertson             Date of Birth: 1971/04/02           MRN: [id number]             PCP: Minette Brine Referring: Minette Brine, FNP Visit Date: 07/03/2018 Occupation: @GUAROCC @  Subjective:  Left elbow pain   History of Present Illness: [name] is a 47 y.o. female with history of seronegative rheumatoid arthritis and fibromyalgia.  Patient is on Humira 40 mg sq injections every 14 days, MTX 0.8 ml once a week, and folic acid 2 mg daily.  She denies missing any doses recently. She denies any recent flares.  She reports she has been having intermittent left elbow pain for the past several months.  She denies any joint swelling.  She denies any injuries.  She states she has some difficulty lifting heavy objects.  She denies any other joint pain or joint swelling.  She denies any recent fibromyalgia flares.  She denies any muscle aches or muscle tenderness at this time.  She continues to take Cymbalta 60 mg po daily and Zanaflex 4 mg PRN for muscle spasms.  She takes hydrocodone for pain relief.     Activities of Daily Living:  Patient reports morning stiffness for 0 minutes.   Patient Denies nocturnal pain.  Difficulty dressing/grooming: Denies Difficulty climbing stairs: Reports Difficulty getting out of chair: Denies Difficulty using hands for taps, buttons, cutlery, and/or writing: Denies  Review of Systems  Constitutional: Positive for fatigue.  HENT: Positive for mouth dryness. Negative for mouth sores and nose dryness.   Eyes: Negative for pain, visual disturbance and dryness.  Respiratory: Negative for cough, hemoptysis, shortness of breath and difficulty breathing.   Cardiovascular: Positive for palpitations and swelling in legs/feet. Negative for chest pain and hypertension.  Gastrointestinal: Negative for blood in stool, constipation and diarrhea.  Endocrine: Negative for increased urination.  Genitourinary: Negative for  difficulty urinating and painful urination.  Musculoskeletal: Positive for joint swelling. Negative for arthralgias, joint pain, myalgias, muscle weakness, morning stiffness, muscle tenderness and myalgias.  Skin: Negative for color change, pallor, rash, hair loss, nodules/bumps, skin tightness, ulcers and sensitivity to sunlight.  Allergic/Immunologic: Negative for susceptible to infections.  Neurological: Negative for dizziness, numbness, headaches and weakness.  Hematological: Negative for swollen glands.  Psychiatric/Behavioral: Positive for sleep disturbance. Negative for depressed mood. The patient is not nervous/anxious.     PMFS History:  Patient Active Problem List   Diagnosis Date Noted  . Fibromyalgia 07/27/2017  . Other fatigue 07/27/2017  . History of hidradenitis suppurativa 07/27/2017  . History of hypertension 07/27/2017  . History of hyperlipidemia 07/27/2017  . Smoker 07/27/2017  . High risk medication use 08/21/2016  . Rheumatoid arthritis of multiple sites without rheumatoid factor ([place]) 08/12/2016  . ARTHRITIS, GENERALIZED 07/14/2010  . UTI 10/27/2009  . FURUNCULOSIS 08/19/2009  . HYPERTENSION, BENIGN ESSENTIAL 03/04/2009  . POLYURIA 03/04/2009  . FIBROMYALGIA 11/09/2007  . HEADACHE 11/09/2007  . HIDRADENITIS SUPPURATIVA 07/11/2007  . DYSLIPIDEMIA 06/28/2007  . LEG EDEMA, BILATERAL 05/05/2007    Past Medical History:  Diagnosis Date  . Fibromyalgia   . Fibromyalgia   . Hypertension   . Osteoarthritis   . Rheumatoid arthritis(714.0)   . Rheumatoid arthritis(714.0)     Family History  Problem Relation Age of Onset  . Heart attack Maternal Grandmother   . Stroke Maternal Grandmother    History reviewed. No pertinent surgical  history. Social History   Social History Narrative  . Not on file    Objective: Vital Signs: BP 122/83 (BP Location: Left Arm, Patient Position: Sitting, Cuff Size: Normal)   Pulse 78   Ht 5' 5.5" (1.664 m)   Wt (!) 317 lb  (143.8 kg)   BMI 51.95 kg/m    Physical Exam  Constitutional: She is oriented to person, place, and time. She appears well-developed and well-nourished.  HENT:  Head: Normocephalic and atraumatic.  Eyes: Conjunctivae and EOM are normal.  Neck: Normal range of motion.  Cardiovascular: Normal rate, regular rhythm, normal heart sounds and intact distal pulses.  Pulmonary/Chest: Effort normal and breath sounds normal.  Abdominal: Soft. Bowel sounds are normal.  Lymphadenopathy:    She has no cervical adenopathy.  Neurological: She is alert and oriented to person, place, and time.  Skin: Skin is warm and dry. Capillary refill takes less than 2 seconds.  Psychiatric: She has a normal mood and affect. Her behavior is normal.  Nursing note and vitals reviewed.    Musculoskeletal Exam: C-spine slightly limited lateral rotation to the left.  Thoracic and lumbar spine good ROM.  No midline spinal tenderness.  No SI joint tenderness.  shoulder joints good ROM with no discomfort. Right elbow good ROM. Left elbow joint contracture. Left elbow tenderness along the joint line.  Wrist joints, MCPs, PIPs ,and DIPs good ROM with no synovitis.  Synovial thickening of left MCPs but no tenderness or synovitis noted.  Hip joints, knee joints, ankle joints, MTPs, PIPs, and DIPs good ROM with no synovitis.  No warmth or effusion of knee joints.  No tenderness of trochanteric bursa bilaterally.   CDAI Exam: CDAI Score: 0.2  Patient Global Assessment: 1 (mm); Provider Global Assessment: 1 (mm) Swollen: 0 ; Tender: 0  Joint Exam   Not documented   There is currently no information documented on the homunculus. Go to the Rheumatology activity and complete the homunculus joint exam.  Investigation: No additional findings.  Imaging: No results found.  Recent Labs: Lab Results  Component Value Date   WBC 8.5 04/27/2018   HGB 13.7 04/27/2018   PLT 506 (H) 04/27/2018   NA 134 (L) 04/27/2018   K 4.3  04/27/2018   CL 103 04/27/2018   CO2 23 04/27/2018   GLUCOSE 185 (H) 04/27/2018   BUN 9 04/27/2018   CREATININE 0.59 04/27/2018   BILITOT 0.3 04/27/2018   ALKPHOS 77 09/12/2017   AST 12 04/27/2018   ALT 12 04/27/2018   PROT 8.8 (H) 04/27/2018   ALBUMIN 3.7 09/12/2017   CALCIUM 9.7 04/27/2018   GFRAA 128 04/27/2018   QFTBGOLDPLUS NEGATIVE 04/27/2018    Speciality Comments: No specialty comments available.  Procedures:  Medium Joint Inj: L elbow on 07/03/2018 2:37 PM Indications: pain Details: 27 G 1.5 in needle, posterior approach Medications: 1 mL lidocaine 1 %; 30 mg triamcinolone acetonide 40 MG/ML Aspirate: 0 mL Outcome: tolerated well, no immediate complications Procedure, treatment alternatives, risks and benefits explained, specific risks discussed. Consent was given by the patient. Immediately prior to procedure a time out was called to verify the correct patient, procedure, equipment, support staff and site/side marked as required. Patient was prepped and draped in the usual sterile fashion.     Allergies: Patient has no known allergies.   Assessment / Plan:     Visit Diagnoses: Rheumatoid arthritis of multiple sites without rheumatoid factor ([place]): She has no synovitis on exam. She has not had any  recent rheumatoid arthritis flares.  She has a mild left elbow contracture.  She has been having intermittent left elbow joint discomfort.  A left elbow joint cortisone injection was performed in the office today.  She tolerated the procedure well.  She was encouraged to work on ROM exercises.  She will continue on the current treatment regimen of Humira 40 mg sq every 14 days, MTX 0.8 ml once a week, and folic acid 2 mg daily.  She does not need any refills at this time.  She was advised to notify us if she develops increased joint pain or joint swelling.  She was encouraged to follow up with PCP for a flu vaccination soon.  She will follow up in 5 months.   High risk medication  use - Humira 40 mg sq once every 14 days and MTX 0.8 ml weekly, folic acid 2 mg daily. CBC and CMP were stable on 04/27/18.  TB gold negative 04/27/18.  She will return in October and every 3 months for lab work to monitor for drug toxicity.    Fibromyalgia: She has not had any recent fibromyalgia flares.  She does not have any muscle aches or muscle tenderness at this time.  She continues to have chronic fatigue and insomnia.  She was encouraged to stay active and exercise on a regular basis. She will continue taking Cymbalta 60 mg po daily and Zanaflex 4 mg PRN for muscle spasms.  She takes Hydrocodone for pain relief.   Pain in left elbow: She has mild left elbow joint contracture on exam.  She has been having intermittent discomfort for the past several months.  She has no tenderness or synovitis on exam today. She has been having some difficulty lifting heavy objects.  She requested a cortisone injection today in the office.  She tolerated the procedure well.  She was encouraged to work on ROM and make accommodations when lifting heavy objects.     Other fatigue: Chronic   History of hidradenitis suppurativa  History of hyperlipidemia  History of hypertension   Orders: Orders Placed This Encounter  Procedures  . Medium Joint Inj   No orders of the defined types were placed in this encounter.   Face-to-face time spent with patient was 30 minutes. Greater than 50% of time was spent in counseling and coordination of care.  Follow-Up Instructions: Return in about 5 months (around 12/03/2018) for Rheumatoid arthritis, Fibromyalgia.   Ofilia Neas, PA-C  Note - This record has been created using Dragon software.  Chart creation errors have been sought, but may not always  have been located. Such creation errors do not reflect on  the standard of medical care.

## 2018-06-21 DIAGNOSIS — M25521 Pain in right elbow: Secondary | ICD-10-CM | POA: Diagnosis not present

## 2018-06-21 DIAGNOSIS — M542 Cervicalgia: Secondary | ICD-10-CM | POA: Diagnosis not present

## 2018-06-21 DIAGNOSIS — G894 Chronic pain syndrome: Secondary | ICD-10-CM | POA: Diagnosis not present

## 2018-06-21 DIAGNOSIS — M25561 Pain in right knee: Secondary | ICD-10-CM | POA: Diagnosis not present

## 2018-06-21 DIAGNOSIS — M79672 Pain in left foot: Secondary | ICD-10-CM | POA: Diagnosis not present

## 2018-06-21 DIAGNOSIS — M79604 Pain in right leg: Secondary | ICD-10-CM | POA: Diagnosis not present

## 2018-06-21 DIAGNOSIS — M79605 Pain in left leg: Secondary | ICD-10-CM | POA: Diagnosis not present

## 2018-06-21 DIAGNOSIS — M25512 Pain in left shoulder: Secondary | ICD-10-CM | POA: Diagnosis not present

## 2018-06-21 DIAGNOSIS — M79671 Pain in right foot: Secondary | ICD-10-CM | POA: Diagnosis not present

## 2018-06-21 DIAGNOSIS — M25571 Pain in right ankle and joints of right foot: Secondary | ICD-10-CM | POA: Diagnosis not present

## 2018-07-03 ENCOUNTER — Encounter: Payer: Self-pay | Admitting: Physician Assistant

## 2018-07-03 ENCOUNTER — Ambulatory Visit (INDEPENDENT_AMBULATORY_CARE_PROVIDER_SITE_OTHER): Payer: Medicare Other | Admitting: Physician Assistant

## 2018-07-03 VITALS — BP 122/83 | HR 78 | Ht 65.5 in | Wt 317.0 lb

## 2018-07-03 DIAGNOSIS — Z872 Personal history of diseases of the skin and subcutaneous tissue: Secondary | ICD-10-CM

## 2018-07-03 DIAGNOSIS — Z79899 Other long term (current) drug therapy: Secondary | ICD-10-CM | POA: Diagnosis not present

## 2018-07-03 DIAGNOSIS — Z8679 Personal history of other diseases of the circulatory system: Secondary | ICD-10-CM

## 2018-07-03 DIAGNOSIS — Z8639 Personal history of other endocrine, nutritional and metabolic disease: Secondary | ICD-10-CM

## 2018-07-03 DIAGNOSIS — M25522 Pain in left elbow: Secondary | ICD-10-CM | POA: Diagnosis not present

## 2018-07-03 DIAGNOSIS — M0609 Rheumatoid arthritis without rheumatoid factor, multiple sites: Secondary | ICD-10-CM | POA: Diagnosis not present

## 2018-07-03 DIAGNOSIS — M797 Fibromyalgia: Secondary | ICD-10-CM | POA: Diagnosis not present

## 2018-07-03 DIAGNOSIS — R5383 Other fatigue: Secondary | ICD-10-CM

## 2018-07-03 MED ORDER — TRIAMCINOLONE ACETONIDE 40 MG/ML IJ SUSP
30.0000 mg | INTRAMUSCULAR | Status: AC | PRN
  Administered 2018-07-03: 30 mg via INTRA_ARTICULAR

## 2018-07-03 MED ORDER — LIDOCAINE HCL 1 % IJ SOLN
1.0000 mL | INTRAMUSCULAR | Status: AC | PRN
  Administered 2018-07-03: 1 mL

## 2018-07-03 NOTE — Patient Instructions (Signed)
Standing Labs We placed an order today for your standing lab work.   Please come back and get your standing labs in October and every 3 months   We have open lab Monday through Friday from 8:30-11:30 AM and 1:30-4:00 PM  at the office of Dr. Shaili Deveshwar.   You may experience shorter wait times on Monday and Friday afternoons. The office is located at [address], [address], [place], [place] [address] No appointment is necessary.   Labs are drawn by [facility].  You may receive a bill from [facility] for your lab work. If you have any questions regarding directions or hours of operation,  please call [phone].    

## 2018-07-15 ENCOUNTER — Emergency Department (HOSPITAL_COMMUNITY): Payer: Medicare Other

## 2018-07-15 ENCOUNTER — Encounter (HOSPITAL_COMMUNITY): Payer: Self-pay | Admitting: *Deleted

## 2018-07-15 ENCOUNTER — Emergency Department (HOSPITAL_COMMUNITY)
Admission: EM | Admit: 2018-07-15 | Discharge: 2018-07-15 | Disposition: A | Discharge status: Home / Self Care | Payer: Medicare Other | Attending: Emergency Medicine | Admitting: Emergency Medicine

## 2018-07-15 ENCOUNTER — Emergency Department (EMERGENCY_DEPARTMENT_HOSPITAL): Payer: Medicare Other

## 2018-07-15 ENCOUNTER — Other Ambulatory Visit: Payer: Self-pay

## 2018-07-15 DIAGNOSIS — R600 Localized edema: Secondary | ICD-10-CM | POA: Diagnosis not present

## 2018-07-15 DIAGNOSIS — M25561 Pain in right knee: Secondary | ICD-10-CM | POA: Insufficient documentation

## 2018-07-15 DIAGNOSIS — M79661 Pain in right lower leg: Secondary | ICD-10-CM | POA: Insufficient documentation

## 2018-07-15 DIAGNOSIS — M069 Rheumatoid arthritis, unspecified: Secondary | ICD-10-CM | POA: Insufficient documentation

## 2018-07-15 DIAGNOSIS — I10 Essential (primary) hypertension: Secondary | ICD-10-CM | POA: Diagnosis not present

## 2018-07-15 DIAGNOSIS — Z79899 Other long term (current) drug therapy: Secondary | ICD-10-CM | POA: Insufficient documentation

## 2018-07-15 DIAGNOSIS — M7989 Other specified soft tissue disorders: Secondary | ICD-10-CM | POA: Diagnosis not present

## 2018-07-15 DIAGNOSIS — F17210 Nicotine dependence, cigarettes, uncomplicated: Secondary | ICD-10-CM | POA: Diagnosis not present

## 2018-07-15 MED ORDER — PREDNISONE 20 MG PO TABS: 40.0000 mg | ORAL_TABLET | Freq: Every day | ORAL | 0 refills | Status: AC

## 2018-07-15 MED ORDER — MORPHINE SULFATE (PF) 4 MG/ML IV SOLN
4.0000 mg | Freq: Once | INTRAVENOUS | Status: AC
  Administered 2018-07-15: 4 mg via INTRAVENOUS
  Filled 2018-07-15: qty 1

## 2018-07-15 NOTE — Progress Notes (Signed)
VASCULAR LAB PRELIMINARY  PRELIMINARY  PRELIMINARY  PRELIMINARY  Right lower extremity venous duplex completed.    Preliminary report:  There is no DVT, SVT, or Baker's cyst noted in the right lower extremity.  Gave results to [employer], PA-C  Firas Guardado, RVT 07/15/2018, 7:30 PM

## 2018-07-15 NOTE — ED Triage Notes (Signed)
The pt  Is c/o rt knee pain and swelling for one week  Worse  For 2 days  Hx of arhtiritis

## 2018-07-15 NOTE — ED Provider Notes (Signed)
[facility] EMERGENCY DEPARTMENT Provider Note   CSN: [id number] Arrival date & time: 07/15/18  1642  History   Chief Complaint Chief Complaint  Patient presents with  . Knee Pain    HPI Shelby Robertson is a 47 y.o. female with a past medical history significant for HTN, fibromyalgia, osteoarthritis and rheumatoid arthritis who presents for evaluation of right knee pain x 2 days. States she noticed the pain when she awoke in the morning. Denies redness or warmth to the extremity. Admits to mild swelling to the knee. Denies history of trauma to the right lower extremity, fever, chills, nausea, vomiting. No aggravating or alleviating factors. Able to ambulate without difficulty.  HPI  Past Medical History:  Diagnosis Date  . Fibromyalgia   . Fibromyalgia   . Hypertension   . Osteoarthritis   . Rheumatoid arthritis(714.0)   . Rheumatoid arthritis(714.0)     Patient Active Problem List   Diagnosis Date Noted  . Fibromyalgia 07/27/2017  . Other fatigue 07/27/2017  . History of hidradenitis suppurativa 07/27/2017  . History of hypertension 07/27/2017  . History of hyperlipidemia 07/27/2017  . Smoker 07/27/2017  . High risk medication use 08/21/2016  . Rheumatoid arthritis of multiple sites without rheumatoid factor ([place]) 08/12/2016  . ARTHRITIS, GENERALIZED 07/14/2010  . UTI 10/27/2009  . FURUNCULOSIS 08/19/2009  . HYPERTENSION, BENIGN ESSENTIAL 03/04/2009  . POLYURIA 03/04/2009  . FIBROMYALGIA 11/09/2007  . HEADACHE 11/09/2007  . HIDRADENITIS SUPPURATIVA 07/11/2007  . DYSLIPIDEMIA 06/28/2007  . LEG EDEMA, BILATERAL 05/05/2007    History reviewed. No pertinent surgical history.   OB History   None      Home Medications    Prior to Admission medications   Medication Sig Start Date End Date Taking? Authorizing Provider  amLODipine (NORVASC) 10 MG tablet Take 10 mg by mouth daily.    [provider]  DULoxetine (CYMBALTA) 60 MG capsule  Take 60 mg by mouth daily.      [provider]  folic acid (FOLVITE) 1 MG tablet Take 2 tablets (2 mg total) by mouth daily. 01/30/18 06/14/18  Ofilia Neas, PA-C  furosemide (LASIX) 20 MG tablet Take 1 tablet by mouth daily. 01/20/15   [provider]  HUMIRA PEN 21 MG/0.4ML PNKT INJECT 40 MG INTO THE SKIN EVERY 14 DAYS. 05/03/18   Deveshwar, Abel Presto, MD  HUMIRA PEN 6 MG/0.8ML PNKT INJECT 1 PEN INTO THE SKIN EVERY 14 DAYS 09/29/17   Bo Merino, MD  hydrochlorothiazide (HYDRODIURIL) 12.5 MG tablet Take 12.5 tablets by mouth daily. 07/18/17   [provider]  HYDROcodone-acetaminophen [facility]) 10-325 MG tablet  01/28/18   [provider]  metFORMIN (GLUCOPHAGE) 500 MG tablet  01/09/18   [provider]  methotrexate 50 MG/2ML injection INJECT 0.8 ML SUBCUTANEOUSLY ONCE A WEEK 01/30/18   Ofilia Neas, PA-C  metoprolol tartrate (LOPRESSOR) 50 MG tablet TAKE 1 TABLET BY MOUTH 2 TIMES DAILY. 05/24/17   Skeet Latch, MD  mupirocin ointment (BACTROBAN) 2 % Apply 1 application topically 3 (three) times daily. 12/23/17   Melynda Ripple, MD  predniSONE (DELTASONE) 20 MG tablet Take 2 tablets (40 mg total) by mouth daily for 4 days. 07/15/18 07/19/18  Henderly, Britni A, PA-C  tiZANidine (ZANAFLEX) 4 MG tablet Take 4 mg by mouth every 6 (six) hours as needed (for muscle spasms).     [provider]  triamcinolone cream (KENALOG) 0.1 % Apply 1 application topically 2 (two) times daily. Apply for 2 weeks. May  use on face 12/23/17   Melynda Ripple, MD  Tuberculin-Allergy Syringes 27G X 1/2" 1 ML MISC Inject 1 Syringe into the skin once a week. 10/28/16   Bo Merino, MD  ZETIA 10 MG tablet Take 1 tablet by mouth daily. 12/18/14   [provider]    Family History Family History  Problem Relation Age of Onset  . Heart attack Maternal Grandmother   . Stroke Maternal Grandmother     Social History Social History   Tobacco Use  .  Smoking status: Current Every Day Smoker    Packs/day: 0.50    Types: Cigarettes  . Smokeless tobacco: Never Used  Substance Use Topics  . Alcohol use: Not Currently  . Drug use: Not Currently     Allergies   Patient has no known allergies.   Review of Systems Review of Systems  Constitutional: Negative for activity change, appetite change, chills, diaphoresis, fatigue, fever and unexpected weight change.  Respiratory: Negative for shortness of breath.   Cardiovascular: Negative for chest pain and leg swelling.  Musculoskeletal: Positive for joint swelling. Negative for back pain and gait problem.       Pain to right knee.  Skin: Negative for color change, pallor, rash and wound.     Physical Exam Updated Vital Signs BP (!) 147/89   Pulse (!) 102   Temp 98.2 F (36.8 C) (Oral)   Resp (!) 23   Ht 5\' 5"  (1.651 m)   Wt 131.5 kg   SpO2 100%   BMI 48.26 kg/m   Physical Exam  Constitutional: She appears well-developed and well-nourished. No distress.  HENT:  Head: Atraumatic.  Mouth/Throat: Oropharynx is clear and moist.  Eyes: Pupils are equal, round, and reactive to light.  Neck: Normal range of motion.  Cardiovascular: Normal rate, regular rhythm, normal heart sounds and intact distal pulses.  No murmur heard. Pulmonary/Chest: Effort normal and breath sounds normal. No stridor. No respiratory distress. She has no wheezes. She has no rales. She exhibits no tenderness.  Abdominal: Soft. Bowel sounds are normal. She exhibits no distension.  Musculoskeletal: Normal range of motion.       Right knee: She exhibits swelling and effusion. She exhibits normal range of motion, no ecchymosis, no deformity, no laceration, no erythema, normal alignment and no bony tenderness.  Mild tenderness to palpation to the right knee. Able to flex and extend at the knee without difficulty. Tenderness to the popliteal fossa. Mild swelling to the suprapatellar region.   Neurological: She is  alert.  Skin: Skin is warm and dry. She is not diaphoretic.  No erythema, warmth or ecchymosis to knee and right lower extremity.  Psychiatric: She has a normal mood and affect.  Nursing note and vitals reviewed.    ED Treatments / Results  Labs (all labs ordered are listed, but only abnormal results are displayed) Labs Reviewed - No data to display  EKG None  Radiology Dg Knee Complete 4 Views Right  Result Date: 07/15/2018 CLINICAL DATA:  Pain and swelling for a week.  History of arthritis. EXAM: RIGHT KNEE - COMPLETE 4+ VIEW COMPARISON:  November 06, 2016 FINDINGS: Tricompartmental degenerative changes. A moderate suprapatellar joint effusion is a little larger compared to January 2018. No acute fracture bony erosion. No bony lesion. No other acute abnormalities. IMPRESSION: Tricompartmental degenerative changes. Moderate suprapatellar joint effusion, larger since January 2018. Electronically Signed   By: Dorise Bullion III M.D   On: 07/15/2018 18:56    Procedures Procedures (  including critical care time)  Medications Ordered in ED Medications - No data to display   Initial Impression / Assessment and Plan / ED Course  I have reviewed the triage vital signs and the nursing notes as well as past medical history.  Pertinent labs & imaging results that were available during my care of the patient were reviewed by me and considered in my medical decision making (see chart for details).  Patient presents with 2 day history of right knee pain. Afebrile, non-septic, non-ill appearing. No known trauma. Mild proximal knee swelling. Low suspicion for septic joint. Knee without warmth or erythema. Difficult to determine landmarks secondary to patients obesity. Obtain US to r/o DVT and plain film.  [place] negative for DVT. Patient is comfortable in bed. Plain film with suprapatellar effusion. Able to flex and extend at the knee. Neurovascularly intact. Will place on prednisone and have patient  follow-up with PCP or rheumatology on Monday. Patient states she cannot have additional pain medication for home as she is under a pain contract with a pain clinic. Discussed strict return precautions with patient. Patient voiced understanding and is agreeable for follow-up.    Final Clinical Impressions(s) / ED Diagnoses   Final diagnoses:  Acute pain of right knee    ED Discharge Orders         Ordered    predniSONE (DELTASONE) 20 MG tablet  Daily     07/15/18 2106           Henderly, Britni A, PA-C 07/15/18 2228    Virgel Manifold, MD 07/17/18 1359

## 2018-07-15 NOTE — Discharge Instructions (Addendum)
You were evaluated today for knee pain.  Your knee xray showed you have swelling above the knee. You knee is not warm, or red which would indicate infection. Please follow-up with your PCP for re-evaluation on Monday. I have prescribed you Prednisone as an anti-inflammatory. Please take as prescribed.   Please return to the ED with any new or worsening symptoms such as:  Contact a doctor if: The pain does not stop. The pain changes or gets worse. You have a fever along with knee pain. Your knee gives out or locks up. Your knee swells, and becomes worse. Get help right away if: Your knee feels warm. You cannot move your knee. You have very bad knee pain. You have chest pain. You have trouble breathing.

## 2018-07-15 NOTE — ED Notes (Signed)
Patient verbalizes understanding of discharge instructions. Opportunity for questioning and answers were provided. Armband removed by staff, pt discharged from ED.  

## 2018-07-19 ENCOUNTER — Ambulatory Visit: Payer: Medicare Other | Admitting: Nurse Practitioner

## 2018-07-20 ENCOUNTER — Encounter: Payer: Self-pay | Admitting: Internal Medicine

## 2018-07-20 ENCOUNTER — Ambulatory Visit (INDEPENDENT_AMBULATORY_CARE_PROVIDER_SITE_OTHER): Payer: Medicare Other | Admitting: Internal Medicine

## 2018-07-20 VITALS — BP 130/78 | HR 77 | Temp 98.4°F | Ht 65.0 in | Wt 300.0 lb

## 2018-07-20 DIAGNOSIS — I10 Essential (primary) hypertension: Secondary | ICD-10-CM

## 2018-07-20 DIAGNOSIS — M25521 Pain in right elbow: Secondary | ICD-10-CM | POA: Diagnosis not present

## 2018-07-20 DIAGNOSIS — M79672 Pain in left foot: Secondary | ICD-10-CM | POA: Diagnosis not present

## 2018-07-20 DIAGNOSIS — G894 Chronic pain syndrome: Secondary | ICD-10-CM | POA: Diagnosis not present

## 2018-07-20 DIAGNOSIS — M25561 Pain in right knee: Secondary | ICD-10-CM | POA: Diagnosis not present

## 2018-07-20 DIAGNOSIS — M79605 Pain in left leg: Secondary | ICD-10-CM | POA: Diagnosis not present

## 2018-07-20 DIAGNOSIS — M79671 Pain in right foot: Secondary | ICD-10-CM | POA: Diagnosis not present

## 2018-07-20 DIAGNOSIS — M25461 Effusion, right knee: Secondary | ICD-10-CM | POA: Diagnosis not present

## 2018-07-20 DIAGNOSIS — M25571 Pain in right ankle and joints of right foot: Secondary | ICD-10-CM | POA: Diagnosis not present

## 2018-07-20 DIAGNOSIS — M25512 Pain in left shoulder: Secondary | ICD-10-CM | POA: Diagnosis not present

## 2018-07-20 DIAGNOSIS — M542 Cervicalgia: Secondary | ICD-10-CM | POA: Diagnosis not present

## 2018-07-20 DIAGNOSIS — M79604 Pain in right leg: Secondary | ICD-10-CM | POA: Diagnosis not present

## 2018-07-20 MED ORDER — PREDNISONE 10 MG PO TABS: ORAL_TABLET | ORAL | 0 refills | Status: DC

## 2018-07-20 NOTE — Progress Notes (Addendum)
Subjective:     Patient ID: Shelby Robertson , female    DOB: August 02, 1971 , 47 y.o.   MRN: [id number] HPI  1- She is here for HTN FU. She does not check her BP's at home. Has chronic edema. Has been to her pain Dr this pm.   2- Was in ER 9/28 for knee pain and swelling within hx of an injury. Was placed on prednisone 20 mg 2 qd x 4 days. She would like to extend it a little longer since this is the only thing that has helped her pain. Her pain meds have not helped at all.    Past Medical History:  Diagnosis Date  . Fibromyalgia   . Fibromyalgia   . Hypertension   . Osteoarthritis   . Rheumatoid arthritis(714.0)   . Rheumatoid arthritis(714.0)       Current Outpatient Medications:  .  amLODipine (NORVASC) 10 MG tablet, Take 10 mg by mouth daily., Disp: , Rfl:  .  DULoxetine (CYMBALTA) 60 MG capsule, Take 60 mg by mouth daily.  , Disp: , Rfl:  .  HUMIRA PEN 40 MG/0.4ML PNKT, INJECT 40 MG INTO THE SKIN EVERY 14 DAYS., Disp: 6 each, Rfl: 0 .  HYDROcodone-acetaminophen (NORCO) 10-325 MG tablet, , Disp: , Rfl: 0 .  metFORMIN (GLUCOPHAGE) 500 MG tablet, , Disp: , Rfl: 1 .  methotrexate 50 MG/2ML injection, INJECT 0.8 ML SUBCUTANEOUSLY ONCE A WEEK, Disp: 12 mL, Rfl: 0 .  metoprolol tartrate (LOPRESSOR) 50 MG tablet, TAKE 1 TABLET BY MOUTH 2 TIMES DAILY., Disp: 180 tablet, Rfl: 1 .  tiZANidine (ZANAFLEX) 4 MG tablet, Take 4 mg by mouth every 6 (six) hours as needed (for muscle spasms). , Disp: , Rfl:  .  Tuberculin-Allergy Syringes 27G X 1/2" 1 ML MISC, Inject 1 Syringe into the skin once a week., Disp: 12 each, Rfl: 3 .  ZETIA 10 MG tablet, Take 1 tablet by mouth daily., Disp: , Rfl:  .  folic acid (FOLVITE) 1 MG tablet, Take 2 tablets (2 mg total) by mouth daily., Disp: 90 tablet, Rfl: 2   Review of Systems  Constitutional: Positive for fatigue. Negative for appetite change, chills, diaphoresis and fever.  HENT: Negative for nosebleeds, postnasal drip, rhinorrhea and tinnitus.    Respiratory: Negative for chest tightness and shortness of breath.   Cardiovascular: Positive for leg swelling. Negative for chest pain and palpitations.  Musculoskeletal: Positive for joint swelling and myalgias.       Aches all over from fibromyalgia.   Skin: Negative for rash.  Neurological: Negative for weakness, numbness and headaches.     Today's Vitals   07/20/18 1630  BP: 130/78  Pulse: 77  Temp: 98.4 F (36.9 C)  TempSrc: Oral  SpO2: 96%  Weight: 300 lb (136.1 kg)  Height: 5\' 5"  (1.651 m)   Body mass index is 49.92 kg/m.   Objective:  Physical Exam  Constitutional: She is oriented to person, place, and time. She appears well-developed and well-nourished.  HENT:  Head: Normocephalic and atraumatic.  Right Ear: External ear normal.  Left Ear: External ear normal.  Nose: Nose normal.  Eyes: Conjunctivae are normal. No scleral icterus.  Neck: Normal range of motion. No thyromegaly present.  Cardiovascular: Normal rate and regular rhythm.  No murmur heard. Pulmonary/Chest: Effort normal and breath sounds normal.  Musculoskeletal: She exhibits edema and tenderness. She exhibits no deformity.       Right knee: She exhibits decreased range of motion, swelling, effusion and  LCL laxity. She exhibits no ecchymosis, no deformity, no laceration, no erythema and normal alignment.  Has +2/4 pitting edema of ankles.   Lymphadenopathy:    She has no cervical adenopathy.  Neurological: She is alert and oriented to person, place, and time.  Skin: Skin is warm and dry. Capillary refill takes less than 2 seconds. No rash noted. No erythema.  Psychiatric: She has a normal mood and affect. Judgment and thought content normal.  Vitals reviewed.      Assessment And Plan:    1- HTN -stable, CBC and CMP ordered 2- R knee effusuion with improvement with help of prednisone. I extended her course with a lost taper as noted. She declined PT. FU with PCP in 3 month.    Rayli Wiederhold  RODRIGUEZ-SOUTHWORTH, PA-C

## 2018-07-21 LAB — CMP14 + ANION GAP
ALBUMIN: 4.3 g/dL (ref 3.5–5.5)
ALT: 22 IU/L (ref 0–32)
AST: 12 IU/L (ref 0–40)
Albumin/Globulin Ratio: 1.2 (ref 1.2–2.2)
Alkaline Phosphatase: 93 IU/L (ref 39–117)
Anion Gap: 16 mmol/L (ref 10.0–18.0)
BILIRUBIN TOTAL: 0.3 mg/dL (ref 0.0–1.2)
BUN / CREAT RATIO: 17 (ref 9–23)
BUN: 10 mg/dL (ref 6–24)
CHLORIDE: 100 mmol/L (ref 96–106)
CO2: 21 mmol/L (ref 20–29)
CREATININE: 0.58 mg/dL (ref 0.57–1.00)
Calcium: 9.5 mg/dL (ref 8.7–10.2)
GFR calc Af Amer: 129 mL/min/{1.73_m2} (ref >59)
GFR calc non Af Amer: 112 mL/min/{1.73_m2} (ref >59)
GLOBULIN, TOTAL: 3.6 g/dL (ref 1.5–4.5)
Glucose: 125 mg/dL — ABNORMAL HIGH (ref 65–99)
Potassium: 4.3 mmol/L (ref 3.5–5.2)
Sodium: 137 mmol/L (ref 134–144)
Total Protein: 7.9 g/dL (ref 6.0–8.5)

## 2018-07-21 LAB — CBC
Hematocrit: 38.6 % (ref 34.0–46.6)
Hemoglobin: 13 g/dL (ref 11.1–15.9)
MCH: 29.1 pg (ref 26.6–33.0)
MCHC: 33.7 g/dL (ref 31.5–35.7)
MCV: 87 fL (ref 79–97)
Platelets: 567 10*3/uL — ABNORMAL HIGH (ref 150–450)
RBC: 4.46 x10E6/uL (ref 3.77–5.28)
RDW: 14.1 % (ref 12.3–15.4)
WBC: 12.7 10*3/uL — AB (ref 3.4–10.8)

## 2018-07-22 ENCOUNTER — Other Ambulatory Visit: Payer: Self-pay | Admitting: Nurse Practitioner

## 2018-07-28 ENCOUNTER — Other Ambulatory Visit: Payer: Self-pay | Admitting: Nurse Practitioner

## 2018-08-02 ENCOUNTER — Telehealth: Payer: Self-pay | Admitting: Nurse Practitioner

## 2018-08-02 NOTE — Telephone Encounter (Signed)
Spoke with patient about the need for PCS services, she needs assistance with light house cleaning, meal prep and bathing. She is having increased swelling to her right lower extremity and told she has inflammation to her right knee. She continues to be followed by Dr. Bronson Curb and pain management Dr. Angie Fava.  PCS form completed.

## 2018-08-17 DIAGNOSIS — G894 Chronic pain syndrome: Secondary | ICD-10-CM | POA: Diagnosis not present

## 2018-08-17 DIAGNOSIS — M25521 Pain in right elbow: Secondary | ICD-10-CM | POA: Diagnosis not present

## 2018-08-17 DIAGNOSIS — M79605 Pain in left leg: Secondary | ICD-10-CM | POA: Diagnosis not present

## 2018-08-17 DIAGNOSIS — M79671 Pain in right foot: Secondary | ICD-10-CM | POA: Diagnosis not present

## 2018-08-17 DIAGNOSIS — M25512 Pain in left shoulder: Secondary | ICD-10-CM | POA: Diagnosis not present

## 2018-08-17 DIAGNOSIS — M25571 Pain in right ankle and joints of right foot: Secondary | ICD-10-CM | POA: Diagnosis not present

## 2018-08-17 DIAGNOSIS — M79672 Pain in left foot: Secondary | ICD-10-CM | POA: Diagnosis not present

## 2018-08-17 DIAGNOSIS — M79604 Pain in right leg: Secondary | ICD-10-CM | POA: Diagnosis not present

## 2018-08-17 DIAGNOSIS — M25561 Pain in right knee: Secondary | ICD-10-CM | POA: Diagnosis not present

## 2018-08-17 DIAGNOSIS — M542 Cervicalgia: Secondary | ICD-10-CM | POA: Diagnosis not present

## 2018-08-29 DIAGNOSIS — E119 Type 2 diabetes mellitus without complications: Secondary | ICD-10-CM | POA: Diagnosis not present

## 2018-08-29 DIAGNOSIS — G4730 Sleep apnea, unspecified: Secondary | ICD-10-CM | POA: Diagnosis not present

## 2018-08-29 DIAGNOSIS — I10 Essential (primary) hypertension: Secondary | ICD-10-CM | POA: Diagnosis not present

## 2018-08-29 DIAGNOSIS — E1141 Type 2 diabetes mellitus with diabetic mononeuropathy: Secondary | ICD-10-CM | POA: Diagnosis not present

## 2018-08-29 DIAGNOSIS — Z6841 Body Mass Index (BMI) 40.0 and over, adult: Secondary | ICD-10-CM | POA: Diagnosis not present

## 2018-08-29 DIAGNOSIS — M25512 Pain in left shoulder: Secondary | ICD-10-CM | POA: Diagnosis not present

## 2018-09-18 DIAGNOSIS — M25521 Pain in right elbow: Secondary | ICD-10-CM | POA: Diagnosis not present

## 2018-09-18 DIAGNOSIS — M79671 Pain in right foot: Secondary | ICD-10-CM | POA: Diagnosis not present

## 2018-09-18 DIAGNOSIS — M542 Cervicalgia: Secondary | ICD-10-CM | POA: Diagnosis not present

## 2018-09-18 DIAGNOSIS — G894 Chronic pain syndrome: Secondary | ICD-10-CM | POA: Diagnosis not present

## 2018-09-18 DIAGNOSIS — M79672 Pain in left foot: Secondary | ICD-10-CM | POA: Diagnosis not present

## 2018-09-18 DIAGNOSIS — M79605 Pain in left leg: Secondary | ICD-10-CM | POA: Diagnosis not present

## 2018-09-18 DIAGNOSIS — M25512 Pain in left shoulder: Secondary | ICD-10-CM | POA: Diagnosis not present

## 2018-09-18 DIAGNOSIS — M25561 Pain in right knee: Secondary | ICD-10-CM | POA: Diagnosis not present

## 2018-09-18 DIAGNOSIS — M25571 Pain in right ankle and joints of right foot: Secondary | ICD-10-CM | POA: Diagnosis not present

## 2018-09-18 DIAGNOSIS — M79604 Pain in right leg: Secondary | ICD-10-CM | POA: Diagnosis not present

## 2018-09-20 ENCOUNTER — Telehealth: Payer: Self-pay | Admitting: Pharmacy Technician

## 2018-09-20 NOTE — Telephone Encounter (Signed)
Received a Prior Authorization request from [employer] for Humira. Authorization has been submitted to patient's insurance via Cover My Meds. Will update once we receive a response.  9:57 AM Beatriz Chancellor, CPhT

## 2018-09-22 NOTE — Telephone Encounter (Signed)
Received a fax from [employer] regarding a prior authorization for Humira. Authorization has been APPROVED from 10/18/18 to 10/18/19.   Will send document to scan center.  Authorization # [id number] Phone # [phone]  8:51 AM Beatriz Chancellor, CPhT

## 2018-09-29 ENCOUNTER — Telehealth: Payer: Self-pay | Admitting: Pharmacist

## 2018-09-29 NOTE — Telephone Encounter (Signed)
Courtesy call for [place] for Humira.  Left voicemail.

## 2018-10-18 ENCOUNTER — Other Ambulatory Visit: Payer: Self-pay

## 2018-10-18 ENCOUNTER — Emergency Department (HOSPITAL_COMMUNITY)
Admission: EM | Admit: 2018-10-18 | Discharge: 2018-10-19 | Disposition: A | Discharge status: Home / Self Care | Payer: Medicare Other | Attending: Emergency Medicine | Admitting: Emergency Medicine

## 2018-10-18 ENCOUNTER — Encounter (HOSPITAL_COMMUNITY): Payer: Self-pay | Admitting: Emergency Medicine

## 2018-10-18 ENCOUNTER — Emergency Department (HOSPITAL_COMMUNITY): Payer: Medicare Other

## 2018-10-18 DIAGNOSIS — I10 Essential (primary) hypertension: Secondary | ICD-10-CM | POA: Diagnosis not present

## 2018-10-18 DIAGNOSIS — Z79899 Other long term (current) drug therapy: Secondary | ICD-10-CM | POA: Insufficient documentation

## 2018-10-18 DIAGNOSIS — F17210 Nicotine dependence, cigarettes, uncomplicated: Secondary | ICD-10-CM | POA: Insufficient documentation

## 2018-10-18 DIAGNOSIS — M069 Rheumatoid arthritis, unspecified: Secondary | ICD-10-CM | POA: Diagnosis not present

## 2018-10-18 DIAGNOSIS — R05 Cough: Secondary | ICD-10-CM | POA: Insufficient documentation

## 2018-10-18 DIAGNOSIS — R0781 Pleurodynia: Secondary | ICD-10-CM | POA: Diagnosis not present

## 2018-10-18 DIAGNOSIS — R918 Other nonspecific abnormal finding of lung field: Secondary | ICD-10-CM | POA: Diagnosis not present

## 2018-10-18 DIAGNOSIS — R0789 Other chest pain: Secondary | ICD-10-CM | POA: Diagnosis not present

## 2018-10-18 DIAGNOSIS — M797 Fibromyalgia: Secondary | ICD-10-CM | POA: Diagnosis not present

## 2018-10-18 LAB — I-STAT TROPONIN, ED: TROPONIN I, POC: 0 ng/mL (ref 0.00–0.08)

## 2018-10-18 LAB — I-STAT BETA HCG BLOOD, ED (MC, WL, AP ONLY): I-stat hCG, quantitative: <5 m[IU]/mL (ref <5)

## 2018-10-18 NOTE — ED Triage Notes (Signed)
Pt reports 5/10 left sided rib cage pain that started yesterday. Pt reports pain increases with movement.

## 2018-10-19 ENCOUNTER — Emergency Department (HOSPITAL_COMMUNITY): Payer: Medicare Other

## 2018-10-19 DIAGNOSIS — R0781 Pleurodynia: Secondary | ICD-10-CM | POA: Diagnosis not present

## 2018-10-19 DIAGNOSIS — G894 Chronic pain syndrome: Secondary | ICD-10-CM | POA: Diagnosis not present

## 2018-10-19 DIAGNOSIS — M79671 Pain in right foot: Secondary | ICD-10-CM | POA: Diagnosis not present

## 2018-10-19 DIAGNOSIS — R0789 Other chest pain: Secondary | ICD-10-CM | POA: Diagnosis not present

## 2018-10-19 DIAGNOSIS — M545 Low back pain: Secondary | ICD-10-CM | POA: Diagnosis not present

## 2018-10-19 DIAGNOSIS — M79672 Pain in left foot: Secondary | ICD-10-CM | POA: Diagnosis not present

## 2018-10-19 LAB — CBC
HCT: 39.2 % (ref 36.0–46.0)
Hemoglobin: 13 g/dL (ref 12.0–15.0)
MCH: 29.5 pg (ref 26.0–34.0)
MCHC: 33.2 g/dL (ref 30.0–36.0)
MCV: 88.9 fL (ref 80.0–100.0)
Platelets: 405 10*3/uL — ABNORMAL HIGH (ref 150–400)
RBC: 4.41 MIL/uL (ref 3.87–5.11)
RDW: 14.1 % (ref 11.5–15.5)
WBC: 9.6 10*3/uL (ref 4.0–10.5)
nRBC: 0 % (ref 0.0–0.2)

## 2018-10-19 LAB — BASIC METABOLIC PANEL
ANION GAP: 9 (ref 5–15)
BUN: 10 mg/dL (ref 6–20)
CALCIUM: 8.8 mg/dL — AB (ref 8.9–10.3)
CO2: 24 mmol/L (ref 22–32)
CREATININE: 0.62 mg/dL (ref 0.44–1.00)
Chloride: 105 mmol/L (ref 98–111)
GFR calc non Af Amer: >60 mL/min (ref >60)
Glucose, Bld: 131 mg/dL — ABNORMAL HIGH (ref 70–99)
Potassium: 3.5 mmol/L (ref 3.5–5.1)
Sodium: 138 mmol/L (ref 135–145)

## 2018-10-19 MED ORDER — IBUPROFEN 800 MG PO TABS: 800.0000 mg | ORAL_TABLET | Freq: Four times a day (QID) | ORAL | 0 refills | Status: DC | PRN

## 2018-10-19 MED ORDER — IOPAMIDOL (ISOVUE-370) INJECTION 76%
INTRAVENOUS | Status: AC
  Filled 2018-10-19: qty 50

## 2018-10-19 MED ORDER — SODIUM CHLORIDE 0.9 % IV BOLUS
500.0000 mL | Freq: Once | INTRAVENOUS | Status: AC
  Administered 2018-10-19: 500 mL via INTRAVENOUS

## 2018-10-19 MED ORDER — MORPHINE SULFATE (PF) 4 MG/ML IV SOLN
4.0000 mg | Freq: Once | INTRAVENOUS | Status: AC
  Administered 2018-10-19: 4 mg via INTRAVENOUS
  Filled 2018-10-19: qty 1

## 2018-10-19 MED ORDER — TRAMADOL HCL 50 MG PO TABS: 50.0000 mg | ORAL_TABLET | Freq: Four times a day (QID) | ORAL | 0 refills | Status: DC | PRN

## 2018-10-19 MED ORDER — IOPAMIDOL (ISOVUE-370) INJECTION 76%
100.0000 mL | Freq: Once | INTRAVENOUS | Status: AC | PRN
  Administered 2018-10-19: 100 mL via INTRAVENOUS

## 2018-10-19 MED ORDER — ONDANSETRON HCL 4 MG/2ML IJ SOLN
4.0000 mg | Freq: Once | INTRAMUSCULAR | Status: AC
  Administered 2018-10-19: 4 mg via INTRAVENOUS
  Filled 2018-10-19: qty 2

## 2018-10-19 MED ORDER — IOPAMIDOL (ISOVUE-370) INJECTION 76%
INTRAVENOUS | Status: AC
  Filled 2018-10-19: qty 100

## 2018-10-19 NOTE — ED Provider Notes (Signed)
[facility] EMERGENCY DEPARTMENT Provider Note   CSN: [id number] Arrival date & time: 10/18/18  2122     History   Chief Complaint Chief Complaint  Patient presents with  . Chest Pain    HPI Shelby Robertson is a 48 y.o. female.  Patient presents to the emergency department for evaluation of pain in the left chest and rib area.  Patient reports that her pain started yesterday and has gotten worse.  Pain is constant, but worsens if she takes a deep breath or coughs.  She also has noticed some pain with movement.  She denies injury to the area.  She has had cough and cold symptoms earlier in the week but they seem to be improving.  No fever.  No anterior chest pain.     Past Medical History:  Diagnosis Date  . Fibromyalgia   . Fibromyalgia   . Hypertension   . Osteoarthritis   . Rheumatoid arthritis(714.0)   . Rheumatoid arthritis(714.0)     Patient Active Problem List   Diagnosis Date Noted  . Fibromyalgia 07/27/2017  . Other fatigue 07/27/2017  . History of hidradenitis suppurativa 07/27/2017  . History of hypertension 07/27/2017  . History of hyperlipidemia 07/27/2017  . Smoker 07/27/2017  . High risk medication use 08/21/2016  . Rheumatoid arthritis of multiple sites without rheumatoid factor ([place]) 08/12/2016  . ARTHRITIS, GENERALIZED 07/14/2010  . UTI 10/27/2009  . FURUNCULOSIS 08/19/2009  . HYPERTENSION, BENIGN ESSENTIAL 03/04/2009  . POLYURIA 03/04/2009  . FIBROMYALGIA 11/09/2007  . HEADACHE 11/09/2007  . HIDRADENITIS SUPPURATIVA 07/11/2007  . DYSLIPIDEMIA 06/28/2007  . LEG EDEMA, BILATERAL 05/05/2007    History reviewed. No pertinent surgical history.   OB History   No obstetric history on file.      Home Medications    Prior to Admission medications   Medication Sig Start Date End Date Taking? Authorizing Provider  amLODipine (NORVASC) 10 MG tablet Take 10 mg by mouth daily.    [provider]  amLODipine-olmesartan  (AZOR) 10-40 MG tablet TAKE 1 TABLET BY MOUTH ONCE DAILY 07/31/18   Minette Brine, FNP  DULoxetine (CYMBALTA) 60 MG capsule Take 60 mg by mouth daily.      [provider]  ezetimibe (ZETIA) 10 MG tablet TAKE 1 TABLET BY MOUTH EVERY DAY 07/31/18   Minette Brine, FNP  folic acid (FOLVITE) 1 MG tablet Take 2 tablets (2 mg total) by mouth daily. 01/30/18 06/14/18  Ofilia Neas, PA-C  HUMIRA PEN 40 MG/0.4ML PNKT INJECT 40 MG INTO THE SKIN EVERY 14 DAYS. 05/03/18   Bo Merino, MD  HYDROcodone-acetaminophen [facility]) 10-325 MG tablet  01/28/18   [provider]  ibuprofen (ADVIL,MOTRIN) 800 MG tablet Take 1 tablet (800 mg total) by mouth every 6 (six) hours as needed for moderate pain. 10/19/18   Orpah Greek, MD  metFORMIN (GLUCOPHAGE) 500 MG tablet  TAKE 1 TABLET BY MOUTH EVERY DAY WITH MORNING AND EVENING MEALS 07/24/18   Minette Brine, FNP  methotrexate 50 MG/2ML injection INJECT 0.8 ML SUBCUTANEOUSLY ONCE A WEEK 01/30/18   Ofilia Neas, PA-C  metoprolol tartrate (LOPRESSOR) 50 MG tablet TAKE 1 TABLET BY MOUTH 2 TIMES DAILY. 05/24/17   Skeet Latch, MD  predniSONE (DELTASONE) 10 MG tablet 2 qd x 2 days, then one qd x 4 days then 1/2 qd x 4 days, then off 07/20/18   Rodriguez-Southworth, Sunday Spillers, PA-C  tiZANidine (ZANAFLEX) 4 MG tablet Take 4 mg by mouth every 6 (six) hours  as needed (for muscle spasms).     [provider]  traMADol (ULTRAM) 50 MG tablet Take 1 tablet (50 mg total) by mouth every 6 (six) hours as needed for severe pain. 10/19/18   Orpah Greek, MD  Tuberculin-Allergy Syringes 27G X 1/2" 1 ML MISC Inject 1 Syringe into the skin once a week. 10/28/16   Bo Merino, MD    Family History Family History  Problem Relation Age of Onset  . Heart attack Maternal Grandmother   . Stroke Maternal Grandmother     Social History Social History   Tobacco Use  . Smoking status: Current Every Day Smoker    Packs/day: 0.50    Types:  Cigarettes  . Smokeless tobacco: Never Used  Substance Use Topics  . Alcohol use: Not Currently  . Drug use: Not Currently     Allergies   Patient has no known allergies.   Review of Systems Review of Systems  Respiratory: Positive for cough.   Musculoskeletal: Positive for back pain.  All other systems reviewed and are negative.    Physical Exam Updated Vital Signs BP 117/78   Pulse 83   Temp 98.5 F (36.9 C) (Oral)   Resp (!) 25   Ht 5\' 6"  (1.676 m)   Wt 127 kg   SpO2 92%   BMI 45.19 kg/m   Physical Exam Vitals signs and nursing note reviewed.  Constitutional:      General: She is not in acute distress.    Appearance: Normal appearance. She is well-developed.  HENT:     Head: Normocephalic and atraumatic.     Right Ear: Hearing normal.     Left Ear: Hearing normal.     Nose: Nose normal.  Eyes:     Conjunctiva/sclera: Conjunctivae normal.     Pupils: Pupils are equal, round, and reactive to light.  Neck:     Musculoskeletal: Normal range of motion and neck supple.  Cardiovascular:     Rate and Rhythm: Regular rhythm.     Heart sounds: S1 normal and S2 normal. No murmur. No friction rub. No gallop.   Pulmonary:     Effort: Pulmonary effort is normal. No respiratory distress.     Breath sounds: Normal breath sounds.  Chest:     Chest wall: No tenderness.  Abdominal:     General: Bowel sounds are normal.     Palpations: Abdomen is soft.     Tenderness: There is no abdominal tenderness. There is no guarding or rebound. Negative signs include Murphy's sign and McBurney's sign.     Hernia: No hernia is present.  Musculoskeletal: Normal range of motion.     Thoracic back: She exhibits tenderness.       Back:  Skin:    General: Skin is warm and dry.     Findings: No rash.  Neurological:     Mental Status: She is alert and oriented to person, place, and time.     GCS: GCS eye subscore is 4. GCS verbal subscore is 5. GCS motor subscore is 6.     Cranial  Nerves: No cranial nerve deficit.     Sensory: No sensory deficit.     Coordination: Coordination normal.  Psychiatric:        Speech: Speech normal.        Behavior: Behavior normal.        Thought Content: Thought content normal.      ED Treatments / Results  Labs (all labs  ordered are listed, but only abnormal results are displayed) Labs Reviewed  BASIC METABOLIC PANEL - Abnormal; Notable for the following components:      Result Value   Glucose, Bld 131 (*)    Calcium 8.8 (*)    All other components within normal limits  CBC - Abnormal; Notable for the following components:   Platelets 405 (*)    All other components within normal limits  I-STAT TROPONIN, ED  I-STAT BETA HCG BLOOD, ED ([facility], [facility], [facility] ONLY)    EKG None  Radiology Dg Chest 2 View  Result Date: 10/18/2018 CLINICAL DATA:  Acute onset of left-sided chest pain. EXAM: CHEST - 2 VIEW COMPARISON:  Chest radiograph performed 03/24/2016 FINDINGS: There is marked elevation of the right hemidiaphragm. Mild patchy right-sided airspace opacities may reflect atelectasis or mild pneumonia. There is no evidence of focal opacification, pleural effusion or pneumothorax. The heart is normal in size; the mediastinal contour is within normal limits. No acute osseous abnormalities are seen. IMPRESSION: Marked elevation of the right hemidiaphragm. Mild patchy right-sided airspace opacities may reflect atelectasis or mild pneumonia. Electronically Signed   By: Garald Balding M.D.   On: 10/18/2018 22:18   Ct Angio Chest Pe W Or Wo Contrast  Result Date: 10/19/2018 CLINICAL DATA:  Acute onset of left-sided rib pain. EXAM: CT ANGIOGRAPHY CHEST WITH CONTRAST TECHNIQUE: Multidetector CT imaging of the chest was performed using the standard protocol during bolus administration of intravenous contrast. Multiplanar CT image reconstructions and MIPs were obtained to evaluate the vascular anatomy. CONTRAST:  169mL ISOVUE-370 IOPAMIDOL (ISOVUE-370)  INJECTION 76% COMPARISON:  Chest radiograph performed 10/18/2018, and CTA of the chest performed 12/28/2012 FINDINGS: Cardiovascular:  There is no evidence of pulmonary embolus. The heart is normal in size. The thoracic aorta is grossly unremarkable. The great vessels are within normal limits. Mediastinum/Nodes: The mediastinum is unremarkable in appearance. No mediastinal lymphadenopathy is seen. No pericardial effusion is identified. A 1.0 cm left peribronchial node remains borderline normal in size. The visualized portions of the thyroid gland are unremarkable. There is asymmetric prominence of right axillary nodes, measuring up to 1.6 cm in short axis. Lungs/Pleura: Mild atelectasis or scarring is noted at the mid lungs bilaterally. There is elevation of the right hemidiaphragm. No pleural effusion or pneumothorax is seen. No masses are identified. Upper Abdomen: The visualized portions of the liver are unremarkable. Musculoskeletal: No acute osseous abnormalities are identified. This anterior bridging osteophytes are noted along the upper thoracic spine. Left convex upper thoracic scoliosis is noted. The visualized musculature is unremarkable in appearance. There is diffuse skin thickening and edema involving the right breast. Would correlate clinically for evidence of inflammatory breast cancer. Review of the MIP images confirms the above findings. IMPRESSION: 1. No evidence of pulmonary embolus. 2. Mild atelectasis or scarring at the mid lungs bilaterally. Elevation of the right hemidiaphragm. 3. Diffuse skin thickening and edema involving the right breast. Would correlate clinically for evidence of inflammatory breast cancer. 4. Asymmetric prominence of right axillary nodes, measuring up to 1.6 cm in short axis. If the patient has inflammatory breast cancer, this could reflect nodal metastasis. These results were called by telephone at the time of interpretation on 10/19/2018 at 3:29 am to Dr. Joseph Berkshire, who verbally acknowledged these results. Electronically Signed   By: Garald Balding M.D.   On: 10/19/2018 03:30    Procedures Procedures (including critical care time)  Medications Ordered in ED Medications  iopamidol (ISOVUE-370) 76 % injection (has  no administration in time range)  morphine 4 MG/ML injection 4 mg (4 mg Intravenous Given 10/19/18 0053)  ondansetron (ZOFRAN) injection 4 mg (4 mg Intravenous Given 10/19/18 0052)  sodium chloride 0.9 % bolus 500 mL (0 mLs Intravenous Stopped 10/19/18 0238)  iopamidol (ISOVUE-370) 76 % injection 100 mL (100 mLs Intravenous Contrast Given 10/19/18 0313)     Initial Impression / Assessment and Plan / ED Course  I have reviewed the triage vital signs and the nursing notes.  Pertinent labs & imaging results that were available during my care of the patient were reviewed by me and considered in my medical decision making (see chart for details).     Patient presents to the emergency department for evaluation of left-sided pain.  Pain is mostly in the posterior rib area.  She is not significantly short of breath, but does have increased pain with deep breathing.  There is also, however, some reproducibility with movement.  Blood work unremarkable.  Plain film x-ray raises concern for possible right mid field pneumonia.  Patient underwent CT angiography to rule out PE and further evaluate lung tissue.  No evidence of pneumonia.  No evidence of PE.  She does, however, have thickening of the skin of the right breast, concerning for possibly inflammatory breast cancer.  I did go back and reevaluate the patient.  She does have some diffuse thickening of the right breast.  This exam was performed with a chaperone present.  Patient informs me that she has had this worked up several times.  I did find records of an MRI from April of this year that showed inflammation.  It was recommended that she undergo biopsy.  She had biopsy performed in May of this year  that showed inflammatory changes without any evidence of cancer.  She does not require further work-up.  Patient's pain is likely musculoskeletal in nature, treat with rest and analgesia.  Final Clinical Impressions(s) / ED Diagnoses   Final diagnoses:  Chest wall pain    ED Discharge Orders         Ordered    ibuprofen (ADVIL,MOTRIN) 800 MG tablet  Every 6 hours PRN     10/19/18 0427    traMADol (ULTRAM) 50 MG tablet  Every 6 hours PRN     10/19/18 0427           Orpah Greek, MD 10/19/18 [phone]

## 2018-10-19 NOTE — ED Notes (Signed)
Patient verbalizes understanding of discharge instructions. Opportunity for questioning and answers were provided. Armband removed by staff, pt discharged from ED by wheelchair   

## 2018-10-27 ENCOUNTER — Telehealth: Payer: Self-pay | Admitting: Pharmacy Technician

## 2018-10-27 ENCOUNTER — Other Ambulatory Visit: Payer: Self-pay

## 2018-10-27 DIAGNOSIS — Z79899 Other long term (current) drug therapy: Secondary | ICD-10-CM

## 2018-10-27 NOTE — Telephone Encounter (Signed)
Received notification from [facility] that they have not been able to reach patient to schedule shipment of Humira. Patient advised she plans to contact pharmacy today and also let patient know that she is due for lab work.

## 2018-10-28 LAB — COMPLETE METABOLIC PANEL WITH GFR
AG Ratio: 1.1 (calc) (ref 1.0–2.5)
ALT: 15 U/L (ref 6–29)
AST: 13 U/L (ref 10–35)
Albumin: 4.3 g/dL (ref 3.6–5.1)
Alkaline phosphatase (APISO): 77 U/L (ref 33–115)
BUN: 10 mg/dL (ref 7–25)
CO2: 22 mmol/L (ref 20–32)
Calcium: 9.6 mg/dL (ref 8.6–10.2)
Chloride: 103 mmol/L (ref 98–110)
Creat: 0.54 mg/dL (ref 0.50–1.10)
GFR, Est African American: 132 mL/min/{1.73_m2} (ref >=60)
GFR, Est Non African American: 114 mL/min/{1.73_m2} (ref >=60)
GLUCOSE: 125 mg/dL — AB (ref 65–99)
Globulin: 3.8 g/dL (calc) — ABNORMAL HIGH (ref 1.9–3.7)
Potassium: 4 mmol/L (ref 3.5–5.3)
Sodium: 137 mmol/L (ref 135–146)
Total Bilirubin: 0.5 mg/dL (ref 0.2–1.2)
Total Protein: 8.1 g/dL (ref 6.1–8.1)

## 2018-10-28 LAB — CBC WITH DIFFERENTIAL/PLATELET
Absolute Monocytes: 482 cells/uL (ref 200–950)
Basophils Absolute: 60 cells/uL (ref 0–200)
Basophils Relative: 0.7 %
EOS PCT: 1.6 %
Eosinophils Absolute: 138 cells/uL (ref 15–500)
HCT: 39 % (ref 35.0–45.0)
Hemoglobin: 13.5 g/dL (ref 11.7–15.5)
Lymphs Abs: 3371 cells/uL (ref 850–3900)
MCH: 30.3 pg (ref 27.0–33.0)
MCHC: 34.6 g/dL (ref 32.0–36.0)
MCV: 87.6 fL (ref 80.0–100.0)
MPV: 9.6 fL (ref 7.5–12.5)
Monocytes Relative: 5.6 %
Neutro Abs: 4549 cells/uL (ref 1500–7800)
Neutrophils Relative %: 52.9 %
Platelets: 416 10*3/uL — ABNORMAL HIGH (ref 140–400)
RBC: 4.45 10*6/uL (ref 3.80–5.10)
RDW: 14.2 % (ref 11.0–15.0)
Total Lymphocyte: 39.2 %
WBC: 8.6 10*3/uL (ref 3.8–10.8)

## 2018-10-30 ENCOUNTER — Other Ambulatory Visit: Payer: Self-pay | Admitting: Nurse Practitioner

## 2018-11-01 ENCOUNTER — Other Ambulatory Visit: Payer: Self-pay | Admitting: Physician Assistant

## 2018-11-01 ENCOUNTER — Other Ambulatory Visit: Payer: Self-pay | Admitting: Rheumatology

## 2018-11-01 ENCOUNTER — Other Ambulatory Visit: Payer: Self-pay | Admitting: Cardiovascular Disease

## 2018-11-01 NOTE — Telephone Encounter (Signed)
Rx request sent to pharmacy.  

## 2018-11-01 NOTE — Telephone Encounter (Signed)
Last visit: 07/03/18 Next Visit: 11/28/18 Labs: 10/27/18 Glucose is 125. Plts are elevated but stable. Rest of lab results are WNL. TB Gold: 04/27/18 Neg   Okay to refill per Dr. Estanislado Pandy

## 2018-11-01 NOTE — Telephone Encounter (Signed)
Last visit: 07/03/18 Next Visit: 11/28/18  Okay to refill per Dr. Estanislado Pandy

## 2018-11-03 MED FILL — HUMIRA PEN 40 MG/0.8ML PNKT: 40 | 84 days supply | Qty: 6 | Fill #0

## 2018-11-03 MED FILL — FOLIC ACID 1 MG TABS: 1 | 30 days supply | Qty: 60 | Fill #0

## 2018-11-03 MED FILL — METOPROLOL TARTRATE 50 MG T: 50 | 90 days supply | Qty: 180 | Fill #0

## 2018-11-03 MED FILL — METHOTREXATE 25 MG/ML VIAL: 50 | 84 days supply | Qty: 10 | Fill #0

## 2018-11-14 NOTE — Progress Notes (Deleted)
Office Visit Note  Patient: Shelby Robertson             Date of Birth: 23-Mar-1971           MRN: [id number]             PCP: Minette Brine, FNP Referring: Minette Brine, FNP Visit Date: 11/28/2018 Occupation: @GUAROCC @  Subjective:  No chief complaint on file.  Humira 40 mg every 14 days, MTX 0.8 mls every 7 days, and folic acid 2 mg daily.  Last TB gold negative 04/27/18. Last CBC/CMP stable 10/27/2018.  History of Present Illness: Shelby Robertson is a 48 y.o. female ***   Activities of Daily Living:  Patient reports morning stiffness for *** {minute/hour:19697}.   Patient {ACTIONS;DENIES/REPORTS:21021675::"Denies"} nocturnal pain.  Difficulty dressing/grooming: {ACTIONS;DENIES/REPORTS:21021675::"Denies"} Difficulty climbing stairs: {ACTIONS;DENIES/REPORTS:21021675::"Denies"} Difficulty getting out of chair: {ACTIONS;DENIES/REPORTS:21021675::"Denies"} Difficulty using hands for taps, buttons, cutlery, and/or writing: {ACTIONS;DENIES/REPORTS:21021675::"Denies"}  No Rheumatology ROS completed.   PMFS History:  Patient Active Problem List   Diagnosis Date Noted  . Fibromyalgia 07/27/2017  . Other fatigue 07/27/2017  . History of hidradenitis suppurativa 07/27/2017  . History of hypertension 07/27/2017  . History of hyperlipidemia 07/27/2017  . Smoker 07/27/2017  . High risk medication use 08/21/2016  . Rheumatoid arthritis of multiple sites without rheumatoid factor ([place]) 08/12/2016  . ARTHRITIS, GENERALIZED 07/14/2010  . UTI 10/27/2009  . FURUNCULOSIS 08/19/2009  . HYPERTENSION, BENIGN ESSENTIAL 03/04/2009  . POLYURIA 03/04/2009  . FIBROMYALGIA 11/09/2007  . HEADACHE 11/09/2007  . HIDRADENITIS SUPPURATIVA 07/11/2007  . DYSLIPIDEMIA 06/28/2007  . LEG EDEMA, BILATERAL 05/05/2007    Past Medical History:  Diagnosis Date  . Fibromyalgia   . Fibromyalgia   . Hypertension   . Osteoarthritis   . Rheumatoid arthritis(714.0)   . Rheumatoid arthritis(714.0)     Family  History  Problem Relation Age of Onset  . Heart attack Maternal Grandmother   . Stroke Maternal Grandmother    No past surgical history on file. Social History   Social History Narrative  . Not on file   Immunization History  Administered Date(s) Administered  . Influenza-Unspecified 07/18/2014  . Td 11/11/2008     Objective: Vital Signs: There were no vitals taken for this visit.   Physical Exam   Musculoskeletal Exam: ***  CDAI Exam: CDAI Score: Not documented Patient Global Assessment: Not documented; Provider Global Assessment: Not documented Swollen: Not documented; Tender: Not documented Joint Exam   Not documented   There is currently no information documented on the homunculus. Go to the Rheumatology activity and complete the homunculus joint exam.  Investigation: No additional findings.  Imaging: Dg Chest 2 View  Result Date: 10/18/2018 CLINICAL DATA:  Acute onset of left-sided chest pain. EXAM: CHEST - 2 VIEW COMPARISON:  Chest radiograph performed 03/24/2016 FINDINGS: There is marked elevation of the right hemidiaphragm. Mild patchy right-sided airspace opacities may reflect atelectasis or mild pneumonia. There is no evidence of focal opacification, pleural effusion or pneumothorax. The heart is normal in size; the mediastinal contour is within normal limits. No acute osseous abnormalities are seen. IMPRESSION: Marked elevation of the right hemidiaphragm. Mild patchy right-sided airspace opacities may reflect atelectasis or mild pneumonia. Electronically Signed   By: Garald Balding M.D.   On: 10/18/2018 22:18   Ct Angio Chest Pe W Or Wo Contrast  Result Date: 10/19/2018 CLINICAL DATA:  Acute onset of left-sided rib pain. EXAM: CT ANGIOGRAPHY CHEST WITH CONTRAST TECHNIQUE: Multidetector CT imaging of the chest was performed using the standard  protocol during bolus administration of intravenous contrast. Multiplanar CT image reconstructions and MIPs were obtained to  evaluate the vascular anatomy. CONTRAST:  123mL ISOVUE-370 IOPAMIDOL (ISOVUE-370) INJECTION 76% COMPARISON:  Chest radiograph performed 10/18/2018, and CTA of the chest performed 12/28/2012 FINDINGS: Cardiovascular:  There is no evidence of pulmonary embolus. The heart is normal in size. The thoracic aorta is grossly unremarkable. The great vessels are within normal limits. Mediastinum/Nodes: The mediastinum is unremarkable in appearance. No mediastinal lymphadenopathy is seen. No pericardial effusion is identified. A 1.0 cm left peribronchial node remains borderline normal in size. The visualized portions of the thyroid gland are unremarkable. There is asymmetric prominence of right axillary nodes, measuring up to 1.6 cm in short axis. Lungs/Pleura: Mild atelectasis or scarring is noted at the mid lungs bilaterally. There is elevation of the right hemidiaphragm. No pleural effusion or pneumothorax is seen. No masses are identified. Upper Abdomen: The visualized portions of the liver are unremarkable. Musculoskeletal: No acute osseous abnormalities are identified. This anterior bridging osteophytes are noted along the upper thoracic spine. Left convex upper thoracic scoliosis is noted. The visualized musculature is unremarkable in appearance. There is diffuse skin thickening and edema involving the right breast. Would correlate clinically for evidence of inflammatory breast cancer. Review of the MIP images confirms the above findings. IMPRESSION: 1. No evidence of pulmonary embolus. 2. Mild atelectasis or scarring at the mid lungs bilaterally. Elevation of the right hemidiaphragm. 3. Diffuse skin thickening and edema involving the right breast. Would correlate clinically for evidence of inflammatory breast cancer. 4. Asymmetric prominence of right axillary nodes, measuring up to 1.6 cm in short axis. If the patient has inflammatory breast cancer, this could reflect nodal metastasis. These results were called by  telephone at the time of interpretation on 10/19/2018 at 3:29 am to Dr. Joseph Berkshire, who verbally acknowledged these results. Electronically Signed   By: Garald Balding M.D.   On: 10/19/2018 03:30    Recent Labs: Lab Results  Component Value Date   WBC 8.6 10/27/2018   HGB 13.5 10/27/2018   PLT 416 (H) 10/27/2018   NA 137 10/27/2018   K 4.0 10/27/2018   CL 103 10/27/2018   CO2 22 10/27/2018   GLUCOSE 125 (H) 10/27/2018   BUN 10 10/27/2018   CREATININE 0.54 10/27/2018   BILITOT 0.5 10/27/2018   ALKPHOS 93 07/20/2018   AST 13 10/27/2018   ALT 15 10/27/2018   PROT 8.1 10/27/2018   ALBUMIN 4.3 07/20/2018   CALCIUM 9.6 10/27/2018   GFRAA 132 10/27/2018   QFTBGOLDPLUS NEGATIVE 04/27/2018    Speciality Comments: No specialty comments available.  Procedures:  No procedures performed Allergies: Patient has no known allergies.   Assessment / Plan:     Visit Diagnoses: Rheumatoid arthritis of multiple sites without rheumatoid factor (HCC)  High risk medication use - Humira 40 mg sq once every 14 days and MTX 0.8 ml weekly, folic acid 2 mg daily.   Fibromyalgia - Cymbalta 60 mg po daily and Zanaflex 4 mg PRN for muscle spasms.  She takes Hydrocodone for pain relief  Contracture of left elbow  Other fatigue  History of hidradenitis suppurativa  History of hyperlipidemia  History of hypertension  Trapezius muscle spasm   Orders: No orders of the defined types were placed in this encounter.  No orders of the defined types were placed in this encounter.   Face-to-face time spent with patient was *** minutes. Greater than 50% of time was spent in counseling  and coordination of care.  Follow-Up Instructions: No follow-ups on file.   Ofilia Neas, PA-C  Note - This record has been created using Dragon software.  Chart creation errors have been sought, but may not always  have been located. Such creation errors do not reflect on  the standard of medical care.

## 2018-11-24 DIAGNOSIS — M545 Low back pain: Secondary | ICD-10-CM | POA: Diagnosis not present

## 2018-11-24 DIAGNOSIS — M79671 Pain in right foot: Secondary | ICD-10-CM | POA: Diagnosis not present

## 2018-11-24 DIAGNOSIS — M79672 Pain in left foot: Secondary | ICD-10-CM | POA: Diagnosis not present

## 2018-11-24 DIAGNOSIS — G894 Chronic pain syndrome: Secondary | ICD-10-CM | POA: Diagnosis not present

## 2018-11-28 ENCOUNTER — Ambulatory Visit: Payer: Medicare Other | Admitting: Physician Assistant

## 2018-12-10 ENCOUNTER — Encounter (HOSPITAL_COMMUNITY): Payer: Self-pay | Admitting: Emergency Medicine

## 2018-12-10 ENCOUNTER — Other Ambulatory Visit: Payer: Self-pay

## 2018-12-10 ENCOUNTER — Emergency Department (HOSPITAL_COMMUNITY)
Admission: EM | Admit: 2018-12-10 | Discharge: 2018-12-11 | Disposition: A | Discharge status: Home / Self Care | Payer: Medicare Other | Attending: Emergency Medicine | Admitting: Emergency Medicine

## 2018-12-10 ENCOUNTER — Emergency Department (HOSPITAL_COMMUNITY): Payer: Medicare Other

## 2018-12-10 DIAGNOSIS — I10 Essential (primary) hypertension: Secondary | ICD-10-CM | POA: Insufficient documentation

## 2018-12-10 DIAGNOSIS — F17210 Nicotine dependence, cigarettes, uncomplicated: Secondary | ICD-10-CM | POA: Insufficient documentation

## 2018-12-10 DIAGNOSIS — M069 Rheumatoid arthritis, unspecified: Secondary | ICD-10-CM | POA: Insufficient documentation

## 2018-12-10 DIAGNOSIS — G8929 Other chronic pain: Secondary | ICD-10-CM | POA: Diagnosis not present

## 2018-12-10 DIAGNOSIS — M25562 Pain in left knee: Secondary | ICD-10-CM | POA: Diagnosis not present

## 2018-12-10 DIAGNOSIS — M25561 Pain in right knee: Secondary | ICD-10-CM | POA: Diagnosis not present

## 2018-12-10 DIAGNOSIS — Z79899 Other long term (current) drug therapy: Secondary | ICD-10-CM | POA: Insufficient documentation

## 2018-12-10 MED ORDER — DICLOFENAC SODIUM 1 % TD GEL: 4.0000 g | Freq: Four times a day (QID) | TRANSDERMAL | 0 refills | Status: DC

## 2018-12-10 MED ORDER — HYDROMORPHONE HCL 1 MG/ML IJ SOLN
1.0000 mg | Freq: Once | INTRAMUSCULAR | Status: AC
  Administered 2018-12-10: 1 mg via INTRAMUSCULAR
  Filled 2018-12-10: qty 1

## 2018-12-10 MED ORDER — PREDNISONE 10 MG (21) PO TBPK: ORAL_TABLET | ORAL | 0 refills | Status: DC

## 2018-12-10 MED ORDER — PREDNISONE 20 MG PO TABS
60.0000 mg | ORAL_TABLET | Freq: Once | ORAL | Status: AC
  Administered 2018-12-10: 60 mg via ORAL
  Filled 2018-12-10: qty 3

## 2018-12-10 NOTE — Discharge Instructions (Signed)
You have been seen today for knee pain.  There were no noted fractures.  There were chronic degenerative changes and some fluid. Antiinflammatory medications: Take 600 mg of ibuprofen every 6 hours or 440 mg (over the counter dose) to 500 mg (prescription dose) of naproxen every 12 hours for the next 3 days. After this time, these medications may be used as needed for pain. Take these medications with food to avoid upset stomach. Choose only one of these medications, do not take them together. Acetaminophen (generic for Tylenol): Should you continue to have additional pain while taking the ibuprofen or naproxen, you may add in acetaminophen as needed. Your daily total maximum amount of acetaminophen from all sources should be limited to 4000mg /day for persons without liver problems, or 2000mg /day for those with liver problems. Diclofenac gel: This is a topical anti-inflammatory medication and can be applied directly to the painful region.  Do not use on the face or genitals.  This medication may be used as an alternative to oral anti-inflammatory medications, such as ibuprofen or naproxen. Ice: May apply ice to the area over the next 24 hours for 15 minutes at a time to reduce swelling. Elevation: Keep the extremity elevated as often as possible to reduce pain and inflammation. Follow up: Follow-up with the orthopedic/rheumatologic specialist for any further management of this issue. Return: Return to the ED for fever, numbness, weakness, increasing pain, overall worsening symptoms, loss of function, or any other major concerns.  For prescription assistance, may try using prescription discount sites or apps, such as goodrx.com

## 2018-12-10 NOTE — ED Triage Notes (Signed)
C/o L knee pain x 3 weeks.  Denies injury.  States her fibromyalgia and arthritis has flared-up.

## 2018-12-10 NOTE — ED Provider Notes (Signed)
[facility] EMERGENCY DEPARTMENT Provider Note   CSN: [id number] Arrival date & time: 12/10/18  2001    History   Chief Complaint Chief Complaint  Patient presents with  . Knee Pain    HPI Shelby Robertson is a 48 y.o. female.     HPI   Shelby Robertson is a 48 y.o. female, with a history of osteoarthritis, HTN, rheumatoid arthritis, chronic pain, and fibromyalgia, presenting to the ED with left knee pain for last 3 weeks.  States she thinks this feels like a "fibromyalgia and arthritis flare."  She states her flares usually last for 3 weeks, but this pain has not started to taper off.  Pain is throbbing, severe, located mostly to the lateral side of the knee, nonradiating.  Intermittent swelling that improves with elevation. She has had no injuries or procedures performed on the knee. Denies falls/trauma, weakness, numbness, fever/chills, lower leg swelling/pain, color change, or any other complaints.      Past Medical History:  Diagnosis Date  . Fibromyalgia   . Fibromyalgia   . Hypertension   . Osteoarthritis   . Rheumatoid arthritis(714.0)   . Rheumatoid arthritis(714.0)     Patient Active Problem List   Diagnosis Date Noted  . Fibromyalgia 07/27/2017  . Other fatigue 07/27/2017  . History of hidradenitis suppurativa 07/27/2017  . History of hypertension 07/27/2017  . History of hyperlipidemia 07/27/2017  . Smoker 07/27/2017  . High risk medication use 08/21/2016  . Rheumatoid arthritis of multiple sites without rheumatoid factor ([place]) 08/12/2016  . ARTHRITIS, GENERALIZED 07/14/2010  . UTI 10/27/2009  . FURUNCULOSIS 08/19/2009  . HYPERTENSION, BENIGN ESSENTIAL 03/04/2009  . POLYURIA 03/04/2009  . FIBROMYALGIA 11/09/2007  . HEADACHE 11/09/2007  . HIDRADENITIS SUPPURATIVA 07/11/2007  . DYSLIPIDEMIA 06/28/2007  . LEG EDEMA, BILATERAL 05/05/2007    History reviewed. No pertinent surgical history.   OB History   No obstetric history on  file.      Home Medications    Prior to Admission medications   Medication Sig Start Date End Date Taking? Authorizing Provider  amLODipine (NORVASC) 10 MG tablet Take 10 mg by mouth daily.    [provider]  amLODipine-olmesartan (AZOR) 10-40 MG tablet TAKE 1 TABLET BY MOUTH ONCE DAILY 07/31/18   Minette Brine, FNP  diclofenac sodium (VOLTAREN) 1 % GEL Apply 4 g topically 4 (four) times daily. 12/10/18   Jeslin Bazinet C, PA-C  DULoxetine (CYMBALTA) 60 MG capsule Take 60 mg by mouth daily.      [provider]  ezetimibe (ZETIA) 10 MG tablet TAKE 1 TABLET BY MOUTH EVERY DAY 07/31/18   Minette Brine, FNP  folic acid (FOLVITE) 1 MG tablet TAKE 2 TABLETS BY MOUTH DAILY 11/01/18   Deveshwar, Abel Presto, MD  HUMIRA PEN 37 MG/0.8ML PNKT INJECT 1 PEN INTO THE SKIN EVERY 14 DAYS 11/01/18   Bo Merino, MD  HYDROcodone-acetaminophen [facility]) 10-325 MG tablet  01/28/18   [provider]  ibuprofen (ADVIL,MOTRIN) 800 MG tablet Take 1 tablet (800 mg total) by mouth every 6 (six) hours as needed for moderate pain. 10/19/18   Orpah Greek, MD  metFORMIN (GLUCOPHAGE) 500 MG tablet TAKE 1 TABLET BY MOUTH ONCE DAILY WITH  MORNING  AND  EVENING  MEALS 11/02/18   Minette Brine, FNP  methotrexate 50 MG/2ML injection INJECT 0.8 ML SUBCUTANEOUSLY ONCE A WEEK 11/01/18   Bo Merino, MD  metoprolol tartrate (LOPRESSOR) 50 MG tablet Take 1 tablet (50 mg total) by mouth 2 (two)  times daily. Please schedule appointment for refills. 11/01/18   Skeet Latch, MD  predniSONE (STERAPRED UNI-PAK 21 TAB) 10 MG (21) TBPK tablet Take 6 tabs (60mg ) day 1, 5 tabs (50mg ) day 2, 4 tabs (40mg ) day 3, 3 tabs (30mg ) day 4, 2 tabs (20mg ) day 5, and 1 tab (10mg ) day 6. 12/10/18   Harper Smoker C, PA-C  tiZANidine (ZANAFLEX) 4 MG tablet Take 4 mg by mouth every 6 (six) hours as needed (for muscle spasms).     [provider]  traMADol (ULTRAM) 50 MG tablet Take 1 tablet (50 mg total) by mouth  every 6 (six) hours as needed for severe pain. 10/19/18   Orpah Greek, MD  Tuberculin-Allergy Syringes 27G X 1/2" 1 ML MISC Inject 1 Syringe into the skin once a week. 10/28/16   Bo Merino, MD    Family History Family History  Problem Relation Age of Onset  . Heart attack Maternal Grandmother   . Stroke Maternal Grandmother     Social History Social History   Tobacco Use  . Smoking status: Current Every Day Smoker    Packs/day: 0.50    Types: Cigarettes  . Smokeless tobacco: Never Used  Substance Use Topics  . Alcohol use: Not Currently  . Drug use: Not Currently     Allergies   Patient has no known allergies.   Review of Systems Review of Systems  Constitutional: Negative for fever.  Gastrointestinal: Negative for nausea and vomiting.  Musculoskeletal: Positive for arthralgias and joint swelling.  Skin: Negative for color change.  Neurological: Negative for weakness and numbness.     Physical Exam Updated Vital Signs BP 97/60 (BP Location: Right Arm)   Pulse 84   Temp 97.7 F (36.5 C) (Oral)   Resp 16   SpO2 97%   Physical Exam Vitals signs and nursing note reviewed.  Constitutional:      General: She is not in acute distress.    Appearance: She is well-developed. She is not diaphoretic.  HENT:     Head: Normocephalic and atraumatic.  Eyes:     Conjunctiva/sclera: Conjunctivae normal.  Neck:     Musculoskeletal: Neck supple.  Cardiovascular:     Rate and Rhythm: Normal rate and regular rhythm.     Pulses:          Posterior tibial pulses are 2+ on the right side and 2+ on the left side.  Pulmonary:     Effort: Pulmonary effort is normal.  Musculoskeletal:     Comments: Tenderness along the left lateral knee. I am able to perform range of motion on the knee, however, this is painful especially at the extremes of flexion and extension.  Questionable swelling when compared to the contralateral side. There is no noted erythema or  increased warmth.  Skin:    General: Skin is warm and dry.     Coloration: Skin is not pale.  Neurological:     Mental Status: She is alert.     Comments: Sensation to light touch grossly intact in the left lower extremity. Strength 5/5 in the left knee and ankle. Patient is able to stand without assistance and ambulates with a cane, which she states she has to do on occasion.  Psychiatric:        Behavior: Behavior normal.      ED Treatments / Results  Labs (all labs ordered are listed, but only abnormal results are displayed) Labs Reviewed - No data to display  EKG  None  Radiology Dg Knee Complete 4 Views Left  Result Date: 12/10/2018 CLINICAL DATA:  Three-week history of pain. History of osteoarthritis and rheumatoid arthritis. EXAM: LEFT KNEE - COMPLETE 4+ VIEW COMPARISON:  01/28/2015 FINDINGS: Degenerative changes in the left knee with tricompartment narrowing and osteophyte formation. No evidence of acute fracture or dislocation. No focal bone lesion or bone destruction. Bone cortex appears intact. Moderate left knee effusion. Soft tissues otherwise unremarkable. IMPRESSION: Degenerative changes in the left knee. Moderate left knee effusion. No acute bony abnormalities. Electronically Signed   By: Lucienne Capers M.D.   On: 12/10/2018 23:34    Procedures Procedures (including critical care time)  Medications Ordered in ED Medications  HYDROmorphone (DILAUDID) injection 1 mg (1 mg Intramuscular Given 12/10/18 2343)  predniSONE (DELTASONE) tablet 60 mg (60 mg Oral Given 12/10/18 2341)     Initial Impression / Assessment and Plan / ED Course  I have reviewed the triage vital signs and the nursing notes.  Pertinent labs & imaging results that were available during my care of the patient were reviewed by me and considered in my medical decision making (see chart for details).        Patient presents with acute on chronic left knee pain.  Chronic appearing degenerative  changes noted on x-ray.  Low suspicion for septic joint; even after 3 weeks patient is afebrile, has minimal noted swelling, no increased warmth, no color change, no suspicious history elements. She will follow-up with orthopedics/rheumatology. The patient was given instructions for home care as well as return precautions. Patient voices understanding of these instructions, accepts the plan, and is comfortable with discharge.    Final Clinical Impressions(s) / ED Diagnoses   Final diagnoses:  Chronic pain of left knee    ED Discharge Orders         Ordered    predniSONE (STERAPRED UNI-PAK 21 TAB) 10 MG (21) TBPK tablet     12/10/18 2305    diclofenac sodium (VOLTAREN) 1 % GEL  4 times daily     12/10/18 2305           Lorayne Bender, PA-C 12/11/18 0001    Duffy Bruce, MD 12/13/18 [phone]

## 2018-12-20 ENCOUNTER — Telehealth: Payer: Self-pay | Admitting: Nurse Practitioner

## 2018-12-20 NOTE — Telephone Encounter (Signed)
Called to schedule Medicare Annual Wellness Visit with the Nurse Health Advisor. No answer on Home/Mobile number.  Left message to call Lattie Haw at [phone].  If patient returns call, please note: their last AWV was on 01/06/2018, please schedule AWV with N[facility] any date AFTER 01/07/2019 AND please schedule an Office Visit with her provider.  Thank you! For any questions please contact: Janace Hoard at [phone] or Skype [email or web address]

## 2018-12-21 DIAGNOSIS — M79604 Pain in right leg: Secondary | ICD-10-CM | POA: Diagnosis not present

## 2018-12-21 DIAGNOSIS — M79672 Pain in left foot: Secondary | ICD-10-CM | POA: Diagnosis not present

## 2018-12-21 DIAGNOSIS — M25561 Pain in right knee: Secondary | ICD-10-CM | POA: Diagnosis not present

## 2018-12-21 DIAGNOSIS — M542 Cervicalgia: Secondary | ICD-10-CM | POA: Diagnosis not present

## 2018-12-21 DIAGNOSIS — G4733 Obstructive sleep apnea (adult) (pediatric): Secondary | ICD-10-CM | POA: Diagnosis not present

## 2018-12-21 DIAGNOSIS — M25521 Pain in right elbow: Secondary | ICD-10-CM | POA: Diagnosis not present

## 2018-12-21 DIAGNOSIS — M25512 Pain in left shoulder: Secondary | ICD-10-CM | POA: Diagnosis not present

## 2018-12-21 DIAGNOSIS — M79671 Pain in right foot: Secondary | ICD-10-CM | POA: Diagnosis not present

## 2018-12-21 DIAGNOSIS — M79605 Pain in left leg: Secondary | ICD-10-CM | POA: Diagnosis not present

## 2018-12-21 DIAGNOSIS — M545 Low back pain: Secondary | ICD-10-CM | POA: Diagnosis not present

## 2018-12-21 DIAGNOSIS — G894 Chronic pain syndrome: Secondary | ICD-10-CM | POA: Diagnosis not present

## 2018-12-21 DIAGNOSIS — I10 Essential (primary) hypertension: Secondary | ICD-10-CM | POA: Diagnosis not present

## 2019-01-02 NOTE — Telephone Encounter (Signed)
On 12/20/2018 at 1:51 PM, patient scheduled her Office Visit at 11:00 AM with Minette Brine, FNP, and her AWV with NHA at 11:15 AM.  Janace Hoard, Care Guide.

## 2019-01-04 NOTE — Progress Notes (Signed)
Office Visit Note  Patient: Shelby Robertson             Date of Birth: September 04, 1971           MRN: [id number]             PCP: Minette Brine, FNP Referring: Minette Brine, FNP Visit Date: 01/05/2019 Occupation: @GUAROCC @  Subjective:  Left knee joint pain   History of Present Illness: Shelby Robertson is a 48 y.o. female with history of seronegative rheumatoid arthritis and fibromyalgia.  She is on Humira 40 mg sq injections every 14 days, MTX 0.8 ml sq once weekly, and folic acid 2 mg po daily.  She is taking Cymbalta 60 mg po daily and Zanaflex 4 mg at bedtime PRN for muscle spasms.  She was evaluated in the emergency department on 12/10/2018 for left knee joint pain.  X-rays were obtained and showed started on a prednisone taper which resolved her symptoms.  She presents today with continued left knee joint pain. She would like a left knee cortisone injection. She denies any other joint pain or joint swelling at this time.  She states that she is been walking with a cane for support.  She states that she has not noticed any increased morning stiffness.  She states that she feels as though Humira and methotrexate are still effective medications.  She reports that her virology has been manageable recently.  She continues have some generalized muscle aches muscle tenderness.  Her fatigue level has been stable and she continues to have insomnia.   Activities of Daily Living:  Patient reports morning stiffness for 5-10 minutes.   Patient Reports nocturnal pain.  Difficulty dressing/grooming: Denies Difficulty climbing stairs: Denies Difficulty getting out of chair: Reports Difficulty using hands for taps, buttons, cutlery, and/or writing: Reports  Review of Systems  Constitutional: Negative for fatigue.  HENT: Positive for mouth dryness. Negative for mouth sores and nose dryness.   Eyes: Negative for pain, visual disturbance and dryness.  Respiratory: Negative for cough, hemoptysis, shortness  of breath, wheezing and difficulty breathing.   Cardiovascular: Negative for chest pain, palpitations, hypertension and swelling in legs/feet.  Gastrointestinal: Negative for abdominal pain, blood in stool, constipation and diarrhea.  Endocrine: Negative for increased urination.  Genitourinary: Negative for painful urination and pelvic pain.  Musculoskeletal: Positive for arthralgias, joint pain, joint swelling, morning stiffness and muscle tenderness. Negative for myalgias, muscle weakness and myalgias.  Skin: Negative for color change, pallor, rash, hair loss, nodules/bumps, redness, skin tightness, ulcers and sensitivity to sunlight.  Allergic/Immunologic: Negative for susceptible to infections.  Neurological: Negative for dizziness, light-headedness, numbness, headaches, memory loss and weakness.  Hematological: Negative for bruising/bleeding tendency and swollen glands.  Psychiatric/Behavioral: Positive for sleep disturbance. Negative for depressed mood and confusion. The patient is not nervous/anxious.     PMFS History:  Patient Active Problem List   Diagnosis Date Noted   Fibromyalgia 07/27/2017   Other fatigue 07/27/2017   History of hidradenitis suppurativa 07/27/2017   History of hypertension 07/27/2017   History of hyperlipidemia 07/27/2017   Smoker 07/27/2017   High risk medication use 08/21/2016   Rheumatoid arthritis of multiple sites without rheumatoid factor ([place]) 08/12/2016   ARTHRITIS, GENERALIZED 07/14/2010   UTI 10/27/2009   FURUNCULOSIS 08/19/2009   HYPERTENSION, BENIGN ESSENTIAL 03/04/2009   POLYURIA 03/04/2009   FIBROMYALGIA 11/09/2007   HEADACHE 11/09/2007   HIDRADENITIS SUPPURATIVA 07/11/2007   DYSLIPIDEMIA 06/28/2007   LEG EDEMA, BILATERAL 05/05/2007    Past Medical History:  Diagnosis Date   Fibromyalgia    Fibromyalgia    Hypertension    Osteoarthritis    Rheumatoid arthritis(714.0)    Rheumatoid arthritis(714.0)       Family History  Problem Relation Age of Onset   Heart attack Maternal Grandmother    Stroke Maternal Grandmother    History reviewed. No pertinent surgical history. Social History   Social History Narrative   Not on file   Immunization History  Administered Date(s) Administered   Influenza-Unspecified 07/18/2014   Td 11/11/2008     Objective: Vital Signs: BP 126/89 (BP Location: Left Arm, Patient Position: Sitting, Cuff Size: Normal)    Pulse 94    Resp 16    Ht 5\' 6"  (1.676 m)    Wt (!) 309 lb (140.2 kg)    BMI 49.87 kg/m    Physical Exam Vitals signs and nursing note reviewed.  Constitutional:      Appearance: She is well-developed.  HENT:     Head: Normocephalic and atraumatic.  Eyes:     Conjunctiva/sclera: Conjunctivae normal.  Neck:     Musculoskeletal: Normal range of motion.  Cardiovascular:     Rate and Rhythm: Normal rate and regular rhythm.     Heart sounds: Normal heart sounds.  Pulmonary:     Effort: Pulmonary effort is normal.     Breath sounds: Normal breath sounds.  Abdominal:     General: Bowel sounds are normal.     Palpations: Abdomen is soft.  Lymphadenopathy:     Cervical: No cervical adenopathy.  Skin:    General: Skin is warm and dry.     Capillary Refill: Capillary refill takes less than 2 seconds.  Neurological:     Mental Status: She is alert and oriented to person, place, and time.  Psychiatric:        Behavior: Behavior normal.      Musculoskeletal Exam: C-spine, thoracic spine, and lumbar spine good ROM.  No midline spinal tenderness.  No SI joint tenderness.  Shoulder joints, elbow joints, wrist joints, MCPs, PIPs ,and DIPs good ROM with no synovitis.  Complete fist formation bilaterally. Hip joints, ankle joints, MTPs, PIPs, DIPs good ROM with no synovitis.  Left knee joint warmth and inflammation noted.  No tenderness or swelling of ankle joints.  No tenderness over trochanteric bursa bilaterally.   CDAI Exam: CDAI Score:  Not documented Patient Global Assessment: Not documented; Provider Global Assessment: Not documented Swollen: 1 ; Tender: 1  Joint Exam      Right  Left  Knee     Swollen Tender     Investigation: No additional findings.  Imaging: Dg Knee Complete 4 Views Left  Result Date: 12/10/2018 CLINICAL DATA:  Three-week history of pain. History of osteoarthritis and rheumatoid arthritis. EXAM: LEFT KNEE - COMPLETE 4+ VIEW COMPARISON:  01/28/2015 FINDINGS: Degenerative changes in the left knee with tricompartment narrowing and osteophyte formation. No evidence of acute fracture or dislocation. No focal bone lesion or bone destruction. Bone cortex appears intact. Moderate left knee effusion. Soft tissues otherwise unremarkable. IMPRESSION: Degenerative changes in the left knee. Moderate left knee effusion. No acute bony abnormalities. Electronically Signed   By: Lucienne Capers M.D.   On: 12/10/2018 23:34    Recent Labs: Lab Results  Component Value Date   WBC 8.6 10/27/2018   HGB 13.5 10/27/2018   PLT 416 (H) 10/27/2018   NA 137 10/27/2018   K 4.0 10/27/2018   CL 103 10/27/2018   CO2  22 10/27/2018   GLUCOSE 125 (H) 10/27/2018   BUN 10 10/27/2018   CREATININE 0.54 10/27/2018   BILITOT 0.5 10/27/2018   ALKPHOS 93 07/20/2018   AST 13 10/27/2018   ALT 15 10/27/2018   PROT 8.1 10/27/2018   ALBUMIN 4.3 07/20/2018   CALCIUM 9.6 10/27/2018   GFRAA 132 10/27/2018   QFTBGOLDPLUS NEGATIVE 04/27/2018    Speciality Comments: No specialty comments available.  Procedures:  Large Joint Inj: L knee on 01/05/2019 10:56 AM Indications: pain Details: 27 G 1.5 in needle, medial approach  Arthrogram: No  Medications: 1.5 mL lidocaine 1 %; 40 mg triamcinolone acetonide 40 MG/ML Aspirate: 0 mL Outcome: tolerated well, no immediate complications Procedure, treatment alternatives, risks and benefits explained, specific risks discussed. Consent was given by the patient. Immediately prior to  procedure a time out was called to verify the correct patient, procedure, equipment, support staff and site/side marked as required. Patient was prepped and draped in the usual sterile fashion.     Allergies: Patient has no known allergies.   Assessment / Plan:     Visit Diagnoses: Rheumatoid arthritis of multiple sites without rheumatoid factor [facility]): She presents today with left knee joint pain, warmth, and inflammation.  She was evaluated in the ED on 12/10/18 and X-rays were obtained and she was started on a prednisone taper.  She completed the taper and her left knee pain and swelling resolved.  She has been having increased left knee joint pain for the past several days. She has been walking with a cane and using voltaren gel topically PRN. She has no other joint pain or joint swelling.  She is been injecting Humira 40 mg subcutaneously every 14 days, methotrexate 0.8 subcutaneous weekly injections, and folic acid 2 mg po daily.   She would like refills of MTX and folic acid.  She will continue on this current treatment regimen.  A left knee cortisone injection was performed today.  We will apply for visco gel injections for both knee joints.  She was advised to notify us if she develops increased joint pain or joint swelling.  She will follow up in 5 months.   High risk medication use - Humira 40 mg sq once every 14 days and MTX 0.8 ml weekly, folic acid 2 mg daily - Plan: CBC with Differential/Platelet, COMPLETE METABOLIC PANEL WITH GFR  Fibromyalgia - Her fibromyalgia has been well controlled.  She continues to have some generalized muscle aches and muscle tenderness.  She is taking Cymbalta 60 mg po daily and Zanaflex 4 mg at bedtime PRN for muscle spasms.  She does not need any refills at this time.  Her level of fatigue has been stable but she continues to have chronic insomnia.  We discussed the importance of regular exercise and good sleep hygiene.   Other fatigue: Stable   History of  hypertension: BP was 126/89 in the office.  She was advised to monitor her BP following the cortisone injection.   History of hidradenitis suppurativa: She is on Humira 40 mg sq injections every 2 weeks.    Other medical conditions are listed as follows:   History of hyperlipidemia   Orders: Orders Placed This Encounter  Procedures   Large Joint Inj   CBC with Differential/Platelet   COMPLETE METABOLIC PANEL WITH GFR   Meds ordered this encounter  Medications   folic acid (FOLVITE) 1 MG tablet    Sig: Take 2 tablets (2 mg total) by mouth daily.  Dispense:  180 tablet    Refill:  1   diclofenac sodium (VOLTAREN) 1 % GEL    Sig: Apply 2 grams to 4 grams topically to affected area up to 4 times daily PRN    Dispense:  4 Tube    Refill:  2   methotrexate 50 MG/2ML injection    Sig: Inject 0.8 ml subcutaneously once weekly.    Dispense:  10 mL    Refill:  0    Face-to-face time spent with patient was 30 minutes. Greater than 50% of time was spent in counseling and coordination of care.  Follow-Up Instructions: Return in about 5 months (around 06/07/2019) for Rheumatoid arthritis, Fibromyalgia.   Ofilia Neas, PA-C   I examined and evaluated the patient with Hazel Sams PA.  Patient had some warmth and swelling in her left knee on examination today.  She had a recent prednisone taper.  We discussed  modifying treatment but she does not want to change her treatment currently.  Per patient's request her left knee joint was injected with cortisone as described above.  She tolerated the procedure well.  The plan of care was discussed as noted above.  Bo Merino, MD  Note - This record has been created using Editor, commissioning.  Chart creation errors have been sought, but may not always  have been located. Such creation errors do not reflect on  the standard of medical care.

## 2019-01-05 ENCOUNTER — Telehealth: Payer: Self-pay

## 2019-01-05 ENCOUNTER — Ambulatory Visit (INDEPENDENT_AMBULATORY_CARE_PROVIDER_SITE_OTHER): Payer: Medicare Other | Admitting: Rheumatology

## 2019-01-05 ENCOUNTER — Other Ambulatory Visit: Payer: Self-pay

## 2019-01-05 ENCOUNTER — Encounter: Payer: Self-pay | Admitting: Rheumatology

## 2019-01-05 VITALS — BP 126/89 | HR 94 | Resp 16 | Ht 66.0 in | Wt 309.0 lb

## 2019-01-05 DIAGNOSIS — M797 Fibromyalgia: Secondary | ICD-10-CM

## 2019-01-05 DIAGNOSIS — Z8679 Personal history of other diseases of the circulatory system: Secondary | ICD-10-CM | POA: Diagnosis not present

## 2019-01-05 DIAGNOSIS — M25562 Pain in left knee: Secondary | ICD-10-CM | POA: Diagnosis not present

## 2019-01-05 DIAGNOSIS — M0609 Rheumatoid arthritis without rheumatoid factor, multiple sites: Secondary | ICD-10-CM

## 2019-01-05 DIAGNOSIS — R5383 Other fatigue: Secondary | ICD-10-CM

## 2019-01-05 DIAGNOSIS — Z8639 Personal history of other endocrine, nutritional and metabolic disease: Secondary | ICD-10-CM | POA: Diagnosis not present

## 2019-01-05 DIAGNOSIS — Z872 Personal history of diseases of the skin and subcutaneous tissue: Secondary | ICD-10-CM | POA: Diagnosis not present

## 2019-01-05 DIAGNOSIS — Z79899 Other long term (current) drug therapy: Secondary | ICD-10-CM

## 2019-01-05 MED ORDER — TRIAMCINOLONE ACETONIDE 40 MG/ML IJ SUSP
40.0000 mg | INTRAMUSCULAR | Status: AC | PRN
  Administered 2019-01-05: 40 mg via INTRA_ARTICULAR

## 2019-01-05 MED ORDER — LIDOCAINE HCL 1 % IJ SOLN
1.5000 mL | INTRAMUSCULAR | Status: AC | PRN
  Administered 2019-01-05: 1.5 mL

## 2019-01-05 MED ORDER — METHOTREXATE SODIUM CHEMO INJECTION 50 MG/2ML: INTRAMUSCULAR | 0 refills | Status: DC

## 2019-01-05 MED ORDER — DICLOFENAC SODIUM 1 % TD GEL: TRANSDERMAL | 2 refills | Status: DC

## 2019-01-05 MED ORDER — FOLIC ACID 1 MG PO TABS: 2.0000 mg | ORAL_TABLET | Freq: Every day | ORAL | 1 refills | Status: DC

## 2019-01-05 NOTE — Telephone Encounter (Signed)
Noted  

## 2019-01-05 NOTE — Telephone Encounter (Signed)
Please apply for bilateral knee visco, per Hazel Sams. Thanks!

## 2019-01-06 LAB — CBC WITH DIFFERENTIAL/PLATELET
Absolute Monocytes: 402 cells/uL (ref 200–950)
BASOS PCT: 0.5 %
Basophils Absolute: 34 cells/uL (ref 0–200)
Eosinophils Absolute: 114 cells/uL (ref 15–500)
Eosinophils Relative: 1.7 %
HCT: 38.4 % (ref 35.0–45.0)
Hemoglobin: 13.1 g/dL (ref 11.7–15.5)
Lymphs Abs: 2667 cells/uL (ref 850–3900)
MCH: 30.6 pg (ref 27.0–33.0)
MCHC: 34.1 g/dL (ref 32.0–36.0)
MCV: 89.7 fL (ref 80.0–100.0)
MONOS PCT: 6 %
MPV: 9.3 fL (ref 7.5–12.5)
Neutro Abs: 3484 cells/uL (ref 1500–7800)
Neutrophils Relative %: 52 %
Platelets: 304 10*3/uL (ref 140–400)
RBC: 4.28 10*6/uL (ref 3.80–5.10)
RDW: 14.4 % (ref 11.0–15.0)
Total Lymphocyte: 39.8 %
WBC: 6.7 10*3/uL (ref 3.8–10.8)

## 2019-01-06 LAB — COMPLETE METABOLIC PANEL WITH GFR
AG Ratio: 1.2 (calc) (ref 1.0–2.5)
ALT: 9 U/L (ref 6–29)
AST: 10 U/L (ref 10–35)
Albumin: 3.8 g/dL (ref 3.6–5.1)
Alkaline phosphatase (APISO): 74 U/L (ref 31–125)
BUN: 8 mg/dL (ref 7–25)
CO2: 24 mmol/L (ref 20–32)
Calcium: 8.9 mg/dL (ref 8.6–10.2)
Chloride: 104 mmol/L (ref 98–110)
Creat: 0.57 mg/dL (ref 0.50–1.10)
GFR, Est African American: 129 mL/min/{1.73_m2} (ref >=60)
GFR, Est Non African American: 111 mL/min/{1.73_m2} (ref >=60)
Globulin: 3.3 g/dL (calc) (ref 1.9–3.7)
Glucose, Bld: 144 mg/dL — ABNORMAL HIGH (ref 65–99)
Potassium: 3.8 mmol/L (ref 3.5–5.3)
Sodium: 138 mmol/L (ref 135–146)
Total Bilirubin: 0.2 mg/dL (ref 0.2–1.2)
Total Protein: 7.1 g/dL (ref 6.1–8.1)

## 2019-01-06 NOTE — Progress Notes (Signed)
Glucose is elevated-144.  Rest of lab work is WNL.

## 2019-01-08 ENCOUNTER — Telehealth (INDEPENDENT_AMBULATORY_CARE_PROVIDER_SITE_OTHER): Payer: Self-pay

## 2019-01-08 NOTE — Telephone Encounter (Signed)
Submitted VOB for Orthovisc series, bilateral knee. 

## 2019-01-10 ENCOUNTER — Other Ambulatory Visit: Payer: Self-pay

## 2019-01-10 ENCOUNTER — Encounter: Payer: Self-pay | Admitting: Nurse Practitioner

## 2019-01-10 ENCOUNTER — Ambulatory Visit (INDEPENDENT_AMBULATORY_CARE_PROVIDER_SITE_OTHER): Payer: Medicare Other

## 2019-01-10 ENCOUNTER — Ambulatory Visit (INDEPENDENT_AMBULATORY_CARE_PROVIDER_SITE_OTHER): Payer: Medicare Other | Admitting: Nurse Practitioner

## 2019-01-10 VITALS — BP 118/76 | HR 73 | Temp 98.2°F | Ht 64.2 in | Wt 304.2 lb

## 2019-01-10 DIAGNOSIS — Z716 Tobacco abuse counseling: Secondary | ICD-10-CM | POA: Diagnosis not present

## 2019-01-10 DIAGNOSIS — E782 Mixed hyperlipidemia: Secondary | ICD-10-CM | POA: Diagnosis not present

## 2019-01-10 DIAGNOSIS — R7303 Prediabetes: Secondary | ICD-10-CM | POA: Diagnosis not present

## 2019-01-10 DIAGNOSIS — Z6841 Body Mass Index (BMI) 40.0 and over, adult: Secondary | ICD-10-CM

## 2019-01-10 DIAGNOSIS — Z23 Encounter for immunization: Secondary | ICD-10-CM | POA: Diagnosis not present

## 2019-01-10 DIAGNOSIS — R222 Localized swelling, mass and lump, trunk: Secondary | ICD-10-CM

## 2019-01-10 DIAGNOSIS — R609 Edema, unspecified: Secondary | ICD-10-CM | POA: Insufficient documentation

## 2019-01-10 DIAGNOSIS — Z0000 Encounter for general adult medical examination without abnormal findings: Secondary | ICD-10-CM

## 2019-01-10 DIAGNOSIS — F17209 Nicotine dependence, unspecified, with unspecified nicotine-induced disorders: Secondary | ICD-10-CM | POA: Diagnosis not present

## 2019-01-10 MED ORDER — METFORMIN HCL 500 MG PO TABS: ORAL_TABLET | ORAL | 1 refills | Status: DC

## 2019-01-10 MED ORDER — EZETIMIBE 10 MG PO TABS: 10.0000 mg | ORAL_TABLET | Freq: Every day | ORAL | 1 refills | Status: DC

## 2019-01-10 MED ORDER — TETANUS-DIPHTH-ACELL PERTUSSIS 5-2-15.5 LF-MCG/0.5 IM SUSP: 0.5000 mL | Freq: Once | INTRAMUSCULAR | 0 refills | Status: AC

## 2019-01-10 MED ORDER — AMLODIPINE BESYLATE 10 MG PO TABS: 10.0000 mg | ORAL_TABLET | Freq: Every day | ORAL | 1 refills | Status: DC

## 2019-01-10 MED ORDER — METOPROLOL TARTRATE 50 MG PO TABS: 50.0000 mg | ORAL_TABLET | Freq: Two times a day (BID) | ORAL | 1 refills | Status: DC

## 2019-01-10 NOTE — Progress Notes (Signed)
Subjective:     Patient ID: Shelby Robertson , female    DOB: 1971-08-30 , 48 y.o.   MRN: [id number]   Chief Complaint  Patient presents with  . Hypertension    HPI  Hypertension  This is a chronic problem. The current episode started more than 1 year ago. The problem has been gradually improving since onset. The problem is controlled. Pertinent negatives include no anxiety, blurred vision, chest pain, headaches, palpitations or shortness of breath. There are no associated agents to hypertension. Risk factors for coronary artery disease include sedentary lifestyle and obesity (prediabetes). Past treatments include calcium channel blockers and beta blockers. The current treatment provides no improvement. There are no compliance problems.  There is no history of angina or kidney disease. There is no history of chronic renal disease.     Past Medical History:  Diagnosis Date  . Fibromyalgia   . Fibromyalgia   . Hypertension   . Osteoarthritis   . Rheumatoid arthritis(714.0)   . Rheumatoid arthritis(714.0)      Family History  Problem Relation Age of Onset  . Diabetes Mother   . Healthy Father   . Heart attack Maternal Grandmother   . Stroke Maternal Grandmother      Current Outpatient Medications:  .  diclofenac sodium (VOLTAREN) 1 % GEL, Apply 2 grams to 4 grams topically to affected area up to 4 times daily PRN, Disp: 4 Tube, Rfl: 2 .  DULoxetine (CYMBALTA) 60 MG capsule, Take 60 mg by mouth daily.  , Disp: , Rfl:  .  folic acid (FOLVITE) 1 MG tablet, Take 2 tablets (2 mg total) by mouth daily., Disp: 180 tablet, Rfl: 1 .  HYDROcodone-acetaminophen (NORCO) 10-325 MG tablet, , Disp: , Rfl: 0 .  methotrexate 50 MG/2ML injection, Inject 0.8 ml subcutaneously once weekly., Disp: 10 mL, Rfl: 0 .  tiZANidine (ZANAFLEX) 4 MG tablet, Take 4 mg by mouth every 6 (six) hours as needed (for muscle spasms). , Disp: , Rfl:  .  Tuberculin-Allergy Syringes 27G X 1/2" 1 ML MISC, Inject 1  Syringe into the skin once a week., Disp: 12 each, Rfl: 3 .  amLODipine (NORVASC) 10 MG tablet, Take 1 tablet (10 mg total) by mouth daily., Disp: 90 tablet, Rfl: 1 .  ezetimibe (ZETIA) 10 MG tablet, Take 1 tablet (10 mg total) by mouth daily., Disp: 90 tablet, Rfl: 1 .  HUMIRA PEN 40 MG/0.8ML PNKT, INJECT 1 PEN INTO THE SKIN EVERY 14 DAYS (Patient not taking: Reported on 01/10/2019), Disp: 6 each, Rfl: 0 .  metFORMIN (GLUCOPHAGE) 500 MG tablet, TAKE 1 TABLET BY MOUTH ONCE DAILY WITH  MORNING  AND  EVENING  MEALS, Disp: 180 tablet, Rfl: 1 .  metoprolol tartrate (LOPRESSOR) 50 MG tablet, Take 1 tablet (50 mg total) by mouth 2 (two) times daily. Please schedule appointment for refills., Disp: 180 tablet, Rfl: 1 .  Tdap (ADACEL) 02-16-14.5 LF-MCG/0.5 injection, Inject 0.5 mLs into the muscle once for 1 dose., Disp: 0.5 mL, Rfl: 0   No Known Allergies   Review of Systems  Constitutional: Negative.  Negative for fatigue.  Eyes: Negative for blurred vision and visual disturbance.  Respiratory: Negative.  Negative for shortness of breath.   Cardiovascular: Negative.  Negative for chest pain, palpitations and leg swelling.  Gastrointestinal: Negative.   Endocrine: Negative.  Negative for polydipsia, polyphagia and polyuria.  Musculoskeletal: Negative.   Skin: Negative.   Neurological: Negative for dizziness, weakness and headaches.  Psychiatric/Behavioral: Negative for confusion.  The patient is not nervous/anxious.      Today's Vitals   01/10/19 1111  BP: 118/76  Pulse: 73  Temp: 98.2 F (36.8 C)  TempSrc: Oral  Weight: (!) 304 lb 3.2 oz (138 kg)  Height: 5' 4.2" (1.631 m)  PainSc: 0-No pain   Body mass index is 51.89 kg/m.   Objective:  Physical Exam Vitals signs reviewed.  Constitutional:      Appearance: Normal appearance. She is well-developed. She is obese. She is not ill-appearing.  HENT:     Head: Normocephalic and atraumatic.     Right Ear: Tympanic membrane, ear canal and  external ear normal. There is no impacted cerumen.     Left Ear: Tympanic membrane, ear canal and external ear normal. There is no impacted cerumen.  Eyes:     Pupils: Pupils are equal, round, and reactive to light.  Cardiovascular:     Rate and Rhythm: Normal rate and regular rhythm.     Pulses: Normal pulses.     Heart sounds: Normal heart sounds. No murmur.  Pulmonary:     Effort: Pulmonary effort is normal.     Breath sounds: Normal breath sounds.  Abdominal:     General: Abdomen is flat. Bowel sounds are normal.     Palpations: Abdomen is soft.  Musculoskeletal: Normal range of motion.  Skin:    General: Skin is warm and dry.     Capillary Refill: Capillary refill takes less than 2 seconds.     Comments: Right upper back with soft fatty tissue present no pain    Neurological:     General: No focal deficit present.     Mental Status: She is alert and oriented to person, place, and time.     Cranial Nerves: No cranial nerve deficit.  Psychiatric:        Mood and Affect: Mood normal.        Behavior: Behavior normal.        Thought Content: Thought content normal.        Judgment: Judgment normal.         Assessment And Plan:     1. Prediabetes  Chronic, controlled  Continue with current medications  Encouraged to limit intake of sugary foods and drinks  Encouraged to increase physical activity to 150 minutes per week - Hemoglobin A1c  2. Mixed hyperlipidemia  Chronic, controlled  Continue with current medications - Lipid panel  3. Morbid obesity (HCC) Chronic Discussed healthy diet and regular exercise options  Encouraged to exercise at least 150 minutes per week with 2 days of strength training  4. Tobacco use disorder, continuous  Continues to smoke cigarettes.  5. Tobacco abuse counseling  Discussed the importance of smoking cessation offered patches and declined I did mention to her she can be referred to a smoking cessation program and she is  interested but not right now.    6. Soft tissue swelling of back  Right upper back with fatty tissue present nontender  If worsens or has pain we will obtain an ultrasound or xray to evaluate further   Minette Brine, FNP

## 2019-01-10 NOTE — Addendum Note (Signed)
Addended by: Minette Brine F on: 01/10/2019 01:32 PM   Modules accepted: Orders

## 2019-01-10 NOTE — Patient Instructions (Signed)
Shelby Robertson , Thank you for taking time to come for your Medicare Wellness Visit. I appreciate your ongoing commitment to your health goals. Please review the following plan we discussed and let me know if I can assist you in the future.   Screening recommendations/referrals: Colonoscopy: n/a Mammogram: 11/2017 Bone Density: n/a Recommended yearly ophthalmology/optometry visit for glaucoma screening and checkup Recommended yearly dental visit for hygiene and checkup  Vaccinations: Influenza vaccine: declines Pneumococcal vaccine: n/a Tdap vaccine: sent to pharmacy Shingles vaccine: n/a    Advanced directives: Advance directive discussed with you today. I have provided a copy for you to complete at home and have notarized. Once this is complete please bring a copy in to our office so we can scan it into your chart.   Conditions/risks identified: obesity  Next appointment: 04/12/2019 at 1100  Preventive Care 40-64 Years, Female Preventive care refers to lifestyle choices and visits with your health care provider that can promote health and wellness. What does preventive care include?  A yearly physical exam. This is also called an annual well check.  Dental exams once or twice a year.  Routine eye exams. Ask your health care provider how often you should have your eyes checked.  Personal lifestyle choices, including:  Daily care of your teeth and gums.  Regular physical activity.  Eating a healthy diet.  Avoiding tobacco and drug use.  Limiting alcohol use.  Practicing safe sex.  Taking low-dose aspirin daily starting at age 29.  Taking vitamin and mineral supplements as recommended by your health care provider. What happens during an annual well check? The services and screenings done by your health care provider during your annual well check will depend on your age, overall health, lifestyle risk factors, and family history of disease. Counseling  Your health care  provider may ask you questions about your:  Alcohol use.  Tobacco use.  Drug use.  Emotional well-being.  Home and relationship well-being.  Sexual activity.  Eating habits.  Work and work Statistician.  Method of birth control.  Menstrual cycle.  Pregnancy history. Screening  You may have the following tests or measurements:  Height, weight, and BMI.  Blood pressure.  Lipid and cholesterol levels. These may be checked every 5 years, or more frequently if you are over 41 years old.  Skin check.  Lung cancer screening. You may have this screening every year starting at age 66 if you have a 30-pack-year history of smoking and currently smoke or have quit within the past 15 years.  Fecal occult blood test (FOBT) of the stool. You may have this test every year starting at age 31.  Flexible sigmoidoscopy or colonoscopy. You may have a sigmoidoscopy every 5 years or a colonoscopy every 10 years starting at age 10.  Hepatitis C blood test.  Hepatitis B blood test.  Sexually transmitted disease (STD) testing.  Diabetes screening. This is done by checking your blood sugar (glucose) after you have not eaten for a while (fasting). You may have this done every 1-3 years.  Mammogram. This may be done every 1-2 years. Talk to your health care provider about when you should start having regular mammograms. This may depend on whether you have a family history of breast cancer.  BRCA-related cancer screening. This may be done if you have a family history of breast, ovarian, tubal, or peritoneal cancers.  Pelvic exam and Pap test. This may be done every 3 years starting at age 67. Starting at age  30, this may be done every 5 years if you have a Pap test in combination with an HPV test.  Bone density scan. This is done to screen for osteoporosis. You may have this scan if you are at high risk for osteoporosis. Discuss your test results, treatment options, and if necessary, the need  for more tests with your health care provider. Vaccines  Your health care provider may recommend certain vaccines, such as:  Influenza vaccine. This is recommended every year.  Tetanus, diphtheria, and acellular pertussis (Tdap, Td) vaccine. You may need a Td booster every 10 years.  Zoster vaccine. You may need this after age 37.  Pneumococcal 13-valent conjugate (PCV13) vaccine. You may need this if you have certain conditions and were not previously vaccinated.  Pneumococcal polysaccharide (PPSV23) vaccine. You may need one or two doses if you smoke cigarettes or if you have certain conditions. Talk to your health care provider about which screenings and vaccines you need and how often you need them. This information is not intended to replace advice given to you by your health care provider. Make sure you discuss any questions you have with your health care provider. Document Released: 10/31/2015 Document Revised: 06/23/2016 Document Reviewed: 08/05/2015 Elsevier Interactive Patient Education  2017 [place] Prevention in the Home Falls can cause injuries. They can happen to people of all ages. There are many things you can do to make your home safe and to help prevent falls. What can I do on the outside of my home?  Regularly fix the edges of walkways and driveways and fix any cracks.  Remove anything that might make you trip as you walk through a door, such as a raised step or threshold.  Trim any bushes or trees on the path to your home.  Use bright outdoor lighting.  Clear any walking paths of anything that might make someone trip, such as rocks or tools.  Regularly check to see if handrails are loose or broken. Make sure that both sides of any steps have handrails.  Any raised decks and porches should have guardrails on the edges.  Have any leaves, snow, or ice cleared regularly.  Use sand or salt on walking paths during winter.  Clean up any spills in  your garage right away. This includes oil or grease spills. What can I do in the bathroom?  Use night lights.  Install grab bars by the toilet and in the tub and shower. Do not use towel bars as grab bars.  Use non-skid mats or decals in the tub or shower.  If you need to sit down in the shower, use a plastic, non-slip stool.  Keep the floor dry. Clean up any water that spills on the floor as soon as it happens.  Remove soap buildup in the tub or shower regularly.  Attach bath mats securely with double-sided non-slip rug tape.  Do not have throw rugs and other things on the floor that can make you trip. What can I do in the bedroom?  Use night lights.  Make sure that you have a light by your bed that is easy to reach.  Do not use any sheets or blankets that are too big for your bed. They should not hang down onto the floor.  Have a firm chair that has side arms. You can use this for support while you get dressed.  Do not have throw rugs and other things on the floor that can  make you trip. What can I do in the kitchen?  Clean up any spills right away.  Avoid walking on wet floors.  Keep items that you use a lot in easy-to-reach places.  If you need to reach something above you, use a strong step stool that has a grab bar.  Keep electrical cords out of the way.  Do not use floor polish or wax that makes floors slippery. If you must use wax, use non-skid floor wax.  Do not have throw rugs and other things on the floor that can make you trip. What can I do with my stairs?  Do not leave any items on the stairs.  Make sure that there are handrails on both sides of the stairs and use them. Fix handrails that are broken or loose. Make sure that handrails are as long as the stairways.  Check any carpeting to make sure that it is firmly attached to the stairs. Fix any carpet that is loose or worn.  Avoid having throw rugs at the top or bottom of the stairs. If you do have  throw rugs, attach them to the floor with carpet tape.  Make sure that you have a light switch at the top of the stairs and the bottom of the stairs. If you do not have them, ask someone to add them for you. What else can I do to help prevent falls?  Wear shoes that:  Do not have high heels.  Have rubber bottoms.  Are comfortable and fit you well.  Are closed at the toe. Do not wear sandals.  If you use a stepladder:  Make sure that it is fully opened. Do not climb a closed stepladder.  Make sure that both sides of the stepladder are locked into place.  Ask someone to hold it for you, if possible.  Clearly mark and make sure that you can see:  Any grab bars or handrails.  First and last steps.  Where the edge of each step is.  Use tools that help you move around (mobility aids) if they are needed. These include:  Canes.  Walkers.  Scooters.  Crutches.  Turn on the lights when you go into a dark area. Replace any light bulbs as soon as they burn out.  Set up your furniture so you have a clear path. Avoid moving your furniture around.  If any of your floors are uneven, fix them.  If there are any pets around you, be aware of where they are.  Review your medicines with your doctor. Some medicines can make you feel dizzy. This can increase your chance of falling. Ask your doctor what other things that you can do to help prevent falls. This information is not intended to replace advice given to you by your health care provider. Make sure you discuss any questions you have with your health care provider. Document Released: 07/31/2009 Document Revised: 03/11/2016 Document Reviewed: 11/08/2014 Elsevier Interactive Patient Education  2017 [place] with Quitting Smoking  Quitting smoking is a physical and mental challenge. You will face cravings, withdrawal symptoms, and temptation. Before quitting, work with your health care provider to make a plan that  can help you cope. Preparation can help you quit and keep you from giving in. How can I cope with cravings? Cravings usually last for 5-10 minutes. If you get through it, the craving will pass. Consider taking the following actions to help you cope with cravings:  Keep your mouth  busy: ? Chew sugar-free gum. ? Suck on hard candies or a straw. ? Brush your teeth.  Keep your hands and body busy: ? Immediately change to a different activity when you feel a craving. ? Squeeze or play with a ball. ? Do an activity or a hobby, like making bead jewelry, practicing needlepoint, or working with wood. ? Mix up your normal routine. ? Take a short exercise break. Go for a quick walk or run up and down stairs. ? Spend time in public places where smoking is not allowed.  Focus on doing something kind or helpful for someone else.  Call a friend or family member to talk during a craving.  Join a support group.  Call a quit line, such as 1-800-QUIT-NOW.  Talk with your health care provider about medicines that might help you cope with cravings and make quitting easier for you. How can I deal with withdrawal symptoms? Your body may experience negative effects as it tries to get used to not having nicotine in the system. These effects are called withdrawal symptoms. They may include:  Feeling hungrier than normal.  Trouble concentrating.  Irritability.  Trouble sleeping.  Feeling depressed.  Restlessness and agitation.  Craving a cigarette. To manage withdrawal symptoms:  Avoid places, people, and activities that trigger your cravings.  Remember why you want to quit.  Get plenty of sleep.  Avoid coffee and other caffeinated drinks. These may worsen some of your symptoms. How can I handle social situations? Social situations can be difficult when you are quitting smoking, especially in the first few weeks. To manage this, you can:  Avoid parties, bars, and other social situations  where people might be smoking.  Avoid alcohol.  Leave right away if you have the urge to smoke.  Explain to your family and friends that you are quitting smoking. Ask for understanding and support.  Plan activities with friends or family where smoking is not an option. What are some ways I can cope with stress? Wanting to smoke may cause stress, and stress can make you want to smoke. Find ways to manage your stress. Relaxation techniques can help. For example:  Breathe slowly and deeply, in through your nose and out through your mouth.  Listen to soothing, relaxing music.  Talk with a family member or friend about your stress.  Light a candle.  Soak in a bath or take a shower.  Think about a peaceful place. What are some ways I can prevent weight gain? Be aware that many people gain weight after they quit smoking. However, not everyone does. To keep from gaining weight, have a plan in place before you quit and stick to the plan after you quit. Your plan should include:  Having healthy snacks. When you have a craving, it may help to: ? Eat plain popcorn, crunchy carrots, celery, or other cut vegetables. ? Chew sugar-free gum.  Changing how you eat: ? Eat small portion sizes at meals. ? Eat 4-6 small meals throughout the day instead of 1-2 large meals a day. ? Be mindful when you eat. Do not watch television or do other things that might distract you as you eat.  Exercising regularly: ? Make time to exercise each day. If you do not have time for a long workout, do short bouts of exercise for 5-10 minutes several times a day. ? Do some form of strengthening exercise, like weight lifting, and some form of aerobic exercise, like running or swimming.  Drinking  plenty of water or other low-calorie or no-calorie drinks. Drink 6-8 glasses of water daily, or as much as instructed by your health care provider. Summary  Quitting smoking is a physical and mental challenge. You will face  cravings, withdrawal symptoms, and temptation to smoke again. Preparation can help you as you go through these challenges.  You can cope with cravings by keeping your mouth busy (such as by chewing gum), keeping your body and hands busy, and making calls to family, friends, or a helpline for people who want to quit smoking.  You can cope with withdrawal symptoms by avoiding places where people smoke, avoiding drinks with caffeine, and getting plenty of rest.  Ask your health care provider about the different ways to prevent weight gain, avoid stress, and handle social situations. This information is not intended to replace advice given to you by your health care provider. Make sure you discuss any questions you have with your health care provider. Document Released: 10/01/2016 Document Revised: 10/01/2016 Document Reviewed: 10/01/2016 Elsevier Interactive Patient Education  2019 [employer].

## 2019-01-10 NOTE — Progress Notes (Signed)
Subjective:   Shelby Robertson is a 48 y.o. female who presents for Medicare Annual (Subsequent) preventive examination.  Review of Systems:  n/a Cardiac Risk Factors include: hypertension;sedentary lifestyle;obesity (BMI >30kg/m2);smoking/ tobacco exposure     Objective:     Vitals: BP 118/76 (BP Location: Left Arm, Patient Position: Sitting)   Pulse 73   Temp 98.2 F (36.8 C) (Oral)   Ht 5' 4.2" (1.631 m)   Wt (!) 304 lb 3.2 oz (138 kg)   BMI 51.89 kg/m   Body mass index is 51.89 kg/m.  Advanced Directives 01/10/2019 12/10/2018 10/18/2018 07/15/2018 04/05/2017 11/14/2016 10/03/2016  Does Patient Have a Medical Advance Directive? No No No No No No No  Would patient like information on creating a medical advance directive? Yes (MAU/Ambulatory/Procedural Areas - Information given) No - Patient declined - - Yes (MAU/Ambulatory/Procedural Areas - Information given) - Yes (MAU/Ambulatory/Procedural Areas - Information given)    Tobacco Social History   Tobacco Use  Smoking Status Current Every Day Smoker  . Packs/day: 0.50  . Years: 20.00  . Pack years: 10.00  . Types: Cigarettes  Smokeless Tobacco Never Used     Ready to quit: Not Answered Counseling given: Not Answered   Clinical Intake:  Pre-visit preparation completed: Yes  Pain : No/denies pain Pain Score: 0-No pain     Nutritional Status: BMI > 30  Obese Nutritional Risks: None Diabetes: No  How often do you need to have someone help you when you read instructions, pamphlets, or other written materials from your doctor or pharmacy?: 1 - Never What is the last grade level you completed in school?: some college  Interpreter Needed?: No  Information entered by :: NAllen LPN  Past Medical History:  Diagnosis Date  . Fibromyalgia   . Fibromyalgia   . Hypertension   . Osteoarthritis   . Rheumatoid arthritis(714.0)   . Rheumatoid arthritis(714.0)    History reviewed. No pertinent surgical history. Family  History  Problem Relation Age of Onset  . Diabetes Mother   . Healthy Father   . Heart attack Maternal Grandmother   . Stroke Maternal Grandmother    Social History   Socioeconomic History  . Marital status: Single    Spouse name: Not on file  . Number of children: Not on file  . Years of education: Not on file  . Highest education level: Not on file  Occupational History  . Occupation: disability  Social Needs  . Financial resource strain: Not hard at all  . Food insecurity:    Worry: Never true    Inability: Never true  . Transportation needs:    Medical: No    Non-medical: No  Tobacco Use  . Smoking status: Current Every Day Smoker    Packs/day: 0.50    Years: 20.00    Pack years: 10.00    Types: Cigarettes  . Smokeless tobacco: Never Used  Substance and Sexual Activity  . Alcohol use: Not Currently  . Drug use: Not Currently  . Sexual activity: Yes    Birth control/protection: Condom  Lifestyle  . Physical activity:    Days per week: 0 days    Minutes per session: 0 min  . Stress: Not at all  Relationships  . Social connections:    Talks on phone: Not on file    Gets together: Not on file    Attends religious service: Not on file    Active member of club or organization: Not on file  Attends meetings of clubs or organizations: Not on file    Relationship status: Not on file  Other Topics Concern  . Not on file  Social History Narrative  . Not on file    Outpatient Encounter Medications as of 01/10/2019  Medication Sig  . amLODipine (NORVASC) 10 MG tablet Take 1 tablet (10 mg total) by mouth daily.  . diclofenac sodium (VOLTAREN) 1 % GEL Apply 2 grams to 4 grams topically to affected area up to 4 times daily PRN  . DULoxetine (CYMBALTA) 60 MG capsule Take 60 mg by mouth daily.    [name] ezetimibe (ZETIA) 10 MG tablet Take 1 tablet (10 mg total) by mouth daily.  . folic acid (FOLVITE) 1 MG tablet Take 2 tablets (2 mg total) by mouth daily.  [name] HUMIRA PEN 40  MG/0.8ML PNKT INJECT 1 PEN INTO THE SKIN EVERY 14 DAYS (Patient not taking: Reported on 01/10/2019)  . HYDROcodone-acetaminophen (NORCO) 10-325 MG tablet   . metFORMIN (GLUCOPHAGE) 500 MG tablet TAKE 1 TABLET BY MOUTH ONCE DAILY WITH  MORNING  AND  EVENING  MEALS  . methotrexate 50 MG/2ML injection Inject 0.8 ml subcutaneously once weekly.  . metoprolol tartrate (LOPRESSOR) 50 MG tablet Take 1 tablet (50 mg total) by mouth 2 (two) times daily. Please schedule appointment for refills.  . Tdap (ADACEL) 02-16-14.5 LF-MCG/0.5 injection Inject 0.5 mLs into the muscle once for 1 dose.  [name] tiZANidine (ZANAFLEX) 4 MG tablet Take 4 mg by mouth every 6 (six) hours as needed (for muscle spasms).   . Tuberculin-Allergy Syringes 27G X 1/2" 1 ML MISC Inject 1 Syringe into the skin once a week.   No facility-administered encounter medications on file as of 01/10/2019.     Activities of Daily Living In your present state of health, do you have any difficulty performing the following activities: 01/10/2019  Hearing? N  Vision? Y  Comment trouble when reading  Difficulty concentrating or making decisions? N  Walking or climbing stairs? Y  Comment takes one at a time  Dressing or bathing? Y  Comment at times needs help getting out of shower  Doing errands, shopping? N  Preparing Food and eating ? N  Using the Toilet? N  In the past six months, have you accidently leaked urine? Y  Comment when has a lot of swelling in legs  Do you have problems with loss of bowel control? N  Managing your Medications? N  Managing your Finances? N  Housekeeping or managing your Housekeeping? N  Some recent data might be hidden    Patient Care Team: Minette Brine, FNP as PCP - General (General Practice)    Assessment:   This is a routine wellness examination for Shelby Robertson.  Exercise Activities and Dietary recommendations Current Exercise Habits: The patient does not participate in regular exercise at present  Goals    .  Weight (lb) < 200 lb (90.7 kg) (pt-stated)       Fall Risk Fall Risk  01/10/2019 01/10/2019 07/20/2018  Falls in the past year? 1 0 Yes  Number falls in past yr: 0 - 2 or more  Comment went to stand up and leg gave out - -  Injury with Fall? 0 - No  Risk for fall due to : History of fall(s) - Impaired balance/gait  Follow up Falls prevention discussed;Education provided - -   Is the patient's home free of loose throw rugs in walkways, pet beds, electrical cords, etc?   yes  Grab bars in the bathroom? no      Handrails on the stairs?   yes      Adequate lighting?   yes  Timed Get Up and Go performed: n/a  Depression Screen PHQ 2/9 Scores 01/10/2019 01/10/2019 07/20/2018  PHQ - 2 Score 0 0 2  PHQ- 9 Score 0 - 6     Cognitive Function     6CIT Screen 01/10/2019  What Year? 0 points  What month? 0 points  What time? 0 points  Count back from 20 0 points  Months in reverse 4 points  Repeat phrase 0 points  Total Score 4    Immunization History  Administered Date(s) Administered  . Influenza-Unspecified 07/18/2014  . Td 11/11/2008    Qualifies for Shingles Vaccine? no  Screening Tests Health Maintenance  Topic Date Due  . HIV Screening  11/15/1987  . PAP SMEAR-Modifier  02/09/2010  . TETANUS/TDAP  11/11/2018  . INFLUENZA VACCINE  01/11/2020 (Originally 05/18/2018)    Cancer Screenings: Lung: Low Dose CT Chest recommended if Age 8-80 years, 30 pack-year currently smoking OR have quit w/in 15years. Patient does not qualify. Breast:  Up to date on Mammogram? No   Up to date of Bone Density/Dexa? n/a Colorectal: n/a  Additional Screenings: : Hepatitis C Screening: n/a     Plan:   TDAP sent to pharmacy. Patient declines flu vaccines. States that she has an appointment for mammogram.  I have personally reviewed and noted the following in the patient's chart:   . Medical and social history . Use of alcohol, tobacco or illicit drugs  . Current medications and  supplements . Functional ability and status . Nutritional status . Physical activity . Advanced directives . List of other physicians . Hospitalizations, surgeries, and ER visits in previous 12 months . Vitals . Screenings to include cognitive, depression, and falls . Referrals and appointments  In addition, I have reviewed and discussed with patient certain preventive protocols, quality metrics, and best practice recommendations. A written personalized care plan for preventive services as well as general preventive health recommendations were provided to patient.     Kellie Simmering, LPN  [id number]

## 2019-01-11 LAB — HEMOGLOBIN A1C
Est. average glucose Bld gHb Est-mCnc: 123 mg/dL
Hgb A1c MFr Bld: 5.9 % — ABNORMAL HIGH (ref 4.8–5.6)

## 2019-01-11 LAB — LIPID PANEL
Chol/HDL Ratio: 4.7 ratio — ABNORMAL HIGH (ref 0.0–4.4)
Cholesterol, Total: 202 mg/dL — ABNORMAL HIGH (ref 100–199)
HDL: 43 mg/dL (ref >39)
LDL Calculated: 127 mg/dL — ABNORMAL HIGH (ref 0–99)
Triglycerides: 161 mg/dL — ABNORMAL HIGH (ref 0–149)
VLDL Cholesterol Cal: 32 mg/dL (ref 5–40)

## 2019-01-12 ENCOUNTER — Telehealth: Payer: Self-pay

## 2019-01-12 NOTE — Telephone Encounter (Signed)
-----   Message from Minette Brine, [place] sent at 01/12/2019  9:47 AM EDT ----- HgbA1c is stable at 5.9, continue with current medications and limiting your intake of sugary and starchy foods this includes white pastas, potatoes and rices.  Total cholesterol is 202 goal is less than 199, triglycerides are 161 goal is less than 149 and LDL is 127 goal is less than 99 limit your intake of fried and fatty foods.

## 2019-01-18 DIAGNOSIS — M79604 Pain in right leg: Secondary | ICD-10-CM | POA: Diagnosis not present

## 2019-01-18 DIAGNOSIS — M79671 Pain in right foot: Secondary | ICD-10-CM | POA: Diagnosis not present

## 2019-01-18 DIAGNOSIS — M79672 Pain in left foot: Secondary | ICD-10-CM | POA: Diagnosis not present

## 2019-01-18 DIAGNOSIS — M25561 Pain in right knee: Secondary | ICD-10-CM | POA: Diagnosis not present

## 2019-01-18 DIAGNOSIS — I10 Essential (primary) hypertension: Secondary | ICD-10-CM | POA: Diagnosis not present

## 2019-01-18 DIAGNOSIS — M79605 Pain in left leg: Secondary | ICD-10-CM | POA: Diagnosis not present

## 2019-01-18 DIAGNOSIS — M25521 Pain in right elbow: Secondary | ICD-10-CM | POA: Diagnosis not present

## 2019-01-18 DIAGNOSIS — M25571 Pain in right ankle and joints of right foot: Secondary | ICD-10-CM | POA: Diagnosis not present

## 2019-01-18 DIAGNOSIS — M1712 Unilateral primary osteoarthritis, left knee: Secondary | ICD-10-CM | POA: Diagnosis not present

## 2019-01-18 DIAGNOSIS — M542 Cervicalgia: Secondary | ICD-10-CM | POA: Diagnosis not present

## 2019-01-18 DIAGNOSIS — M25512 Pain in left shoulder: Secondary | ICD-10-CM | POA: Diagnosis not present

## 2019-01-18 DIAGNOSIS — M545 Low back pain: Secondary | ICD-10-CM | POA: Diagnosis not present

## 2019-01-19 ENCOUNTER — Telehealth (INDEPENDENT_AMBULATORY_CARE_PROVIDER_SITE_OTHER): Payer: Self-pay

## 2019-01-19 NOTE — Telephone Encounter (Signed)
Gel Injection information for Dr. Estanislado Pandy or Hazel Sams.  Thank you.  Patient is approved for Orthovisc series, bilateral knee. [place] Patient must meet medicare deductible first. ($198.00) Patient will be responsible for 20% of the allowable amount. No Co-pay No PA required

## 2019-01-22 NOTE — Telephone Encounter (Signed)
LMOM informing patient she was approved for Orthovisc injections for her bilateral knees and that we would call her to schedule once we begin seeing patient's in the office again.

## 2019-02-02 ENCOUNTER — Other Ambulatory Visit: Payer: Self-pay | Admitting: Rheumatology

## 2019-02-02 NOTE — Telephone Encounter (Signed)
Last Visit: 01/05/19 Next Visit: 06/07/19 Labs: 01/05/19 Glucose is elevated-144. Rest of lab work is WNL.  TB Gold: 04/27/18 Neg   Okay to refill per Dr. Estanislado Pandy

## 2019-02-06 MED FILL — DICLOFENAC SODIUM 1 % GEL: 1 | 25 days supply | Qty: 400 | Fill #0

## 2019-02-06 MED FILL — FOLIC ACID 1 MG TABS: 1 | 90 days supply | Qty: 180 | Fill #0

## 2019-02-06 MED FILL — HUMIRA PEN 40 MG/0.8ML PNKT: 40 | 84 days supply | Qty: 6 | Fill #0

## 2019-02-06 MED FILL — METHOTREXATE 25 MG/ML VIAL: 50 | 84 days supply | Qty: 10 | Fill #0

## 2019-02-08 ENCOUNTER — Other Ambulatory Visit: Payer: Self-pay

## 2019-02-08 ENCOUNTER — Emergency Department (HOSPITAL_COMMUNITY)
Admission: EM | Admit: 2019-02-08 | Discharge: 2019-02-08 | Disposition: A | Discharge status: Home / Self Care | Payer: Medicare Other | Attending: Emergency Medicine | Admitting: Emergency Medicine

## 2019-02-08 ENCOUNTER — Encounter (HOSPITAL_COMMUNITY): Payer: Self-pay | Admitting: Emergency Medicine

## 2019-02-08 ENCOUNTER — Emergency Department (HOSPITAL_COMMUNITY): Payer: Medicare Other

## 2019-02-08 DIAGNOSIS — R0789 Other chest pain: Secondary | ICD-10-CM | POA: Diagnosis not present

## 2019-02-08 DIAGNOSIS — Z79899 Other long term (current) drug therapy: Secondary | ICD-10-CM | POA: Insufficient documentation

## 2019-02-08 DIAGNOSIS — E669 Obesity, unspecified: Secondary | ICD-10-CM | POA: Diagnosis not present

## 2019-02-08 DIAGNOSIS — Z6841 Body Mass Index (BMI) 40.0 and over, adult: Secondary | ICD-10-CM | POA: Insufficient documentation

## 2019-02-08 DIAGNOSIS — M797 Fibromyalgia: Secondary | ICD-10-CM | POA: Insufficient documentation

## 2019-02-08 DIAGNOSIS — I10 Essential (primary) hypertension: Secondary | ICD-10-CM | POA: Insufficient documentation

## 2019-02-08 DIAGNOSIS — F17210 Nicotine dependence, cigarettes, uncomplicated: Secondary | ICD-10-CM | POA: Insufficient documentation

## 2019-02-08 DIAGNOSIS — M069 Rheumatoid arthritis, unspecified: Secondary | ICD-10-CM | POA: Insufficient documentation

## 2019-02-08 LAB — BASIC METABOLIC PANEL
Anion gap: 12 (ref 5–15)
BUN: 11 mg/dL (ref 6–20)
CO2: 20 mmol/L — ABNORMAL LOW (ref 22–32)
Calcium: 9.5 mg/dL (ref 8.9–10.3)
Chloride: 106 mmol/L (ref 98–111)
Creatinine, Ser: 0.81 mg/dL (ref 0.44–1.00)
GFR calc Af Amer: >60 mL/min (ref >60)
GFR calc non Af Amer: >60 mL/min (ref >60)
Glucose, Bld: 130 mg/dL — ABNORMAL HIGH (ref 70–99)
Potassium: 3.9 mmol/L (ref 3.5–5.1)
Sodium: 138 mmol/L (ref 135–145)

## 2019-02-08 LAB — CBC WITH DIFFERENTIAL/PLATELET
Abs Immature Granulocytes: 0.03 10*3/uL (ref 0.00–0.07)
Basophils Absolute: 0.1 10*3/uL (ref 0.0–0.1)
Basophils Relative: 1 %
Eosinophils Absolute: 0.2 10*3/uL (ref 0.0–0.5)
Eosinophils Relative: 2 %
HCT: 40.6 % (ref 36.0–46.0)
Hemoglobin: 13.7 g/dL (ref 12.0–15.0)
Immature Granulocytes: 0 %
Lymphocytes Relative: 32 %
Lymphs Abs: 3.4 10*3/uL (ref 0.7–4.0)
MCH: 30 pg (ref 26.0–34.0)
MCHC: 33.7 g/dL (ref 30.0–36.0)
MCV: 88.8 fL (ref 80.0–100.0)
Monocytes Absolute: 0.6 10*3/uL (ref 0.1–1.0)
Monocytes Relative: 6 %
Neutro Abs: 6.3 10*3/uL (ref 1.7–7.7)
Neutrophils Relative %: 59 %
Platelets: 374 10*3/uL (ref 150–400)
RBC: 4.57 MIL/uL (ref 3.87–5.11)
RDW: 13.5 % (ref 11.5–15.5)
WBC: 10.5 10*3/uL (ref 4.0–10.5)
nRBC: 0 % (ref 0.0–0.2)

## 2019-02-08 LAB — I-STAT BETA HCG BLOOD, ED (MC, WL, AP ONLY): I-stat hCG, quantitative: <5 m[IU]/mL (ref <5)

## 2019-02-08 LAB — TROPONIN I: Troponin I: <0.03 ng/mL (ref <0.03)

## 2019-02-08 MED ORDER — KETOROLAC TROMETHAMINE 30 MG/ML IJ SOLN
30.0000 mg | Freq: Once | INTRAMUSCULAR | Status: DC
  Filled 2019-02-08: qty 1

## 2019-02-08 MED ORDER — KETOROLAC TROMETHAMINE 30 MG/ML IJ SOLN
30.0000 mg | Freq: Once | INTRAMUSCULAR | Status: AC
  Administered 2019-02-08: 13:00:00 30 mg via INTRAMUSCULAR

## 2019-02-08 MED ORDER — ALBUTEROL SULFATE HFA 108 (90 BASE) MCG/ACT IN AERS
2.0000 | INHALATION_SPRAY | Freq: Once | RESPIRATORY_TRACT | Status: AC
  Administered 2019-02-08: 2 via RESPIRATORY_TRACT
  Filled 2019-02-08: qty 6.7

## 2019-02-08 MED ORDER — METFORMIN HCL 500 MG PO TABS: ORAL_TABLET | ORAL | 1 refills | Status: DC

## 2019-02-08 NOTE — ED Triage Notes (Signed)
Pt states she has been having chest pain X3days. Pt states she smokes half pack a day and when she has anxiety she smokes more. Pt states she is very worried about the "virus"

## 2019-02-08 NOTE — ED Notes (Addendum)
Pt. returned from XR. 

## 2019-02-08 NOTE — ED Provider Notes (Signed)
[facility] EMERGENCY DEPARTMENT Provider Note   CSN: [id number] Arrival date & time: 02/08/19  1223    History   Chief Complaint Chief Complaint  Patient presents with  . Chest Pain    HPI Shelby Robertson is a 48 y.o. female with history of fibromyalgia, hypertension, osteoarthritis, rheumatoid arthritis, hyperlipidemia, and tobacco abuse presents for evaluation of acute onset, constant generalized chest pains for 3 days.  She reports that the pain is throbbing and aching, worsens sitting upright too quickly, improves with her home hydrocodone, tizanidine, and Cymbalta but never goes away entirely.  She has also used her son's home nebulizer treatments with some improvement as well. She denies any associated fevers, lightheadedness, syncope, diaphoresis, shortness of breath, cough, abdominal pain, nausea, or vomiting.  Reports that she generally smokes half a pack of cigarettes daily but has been smoking more lately due to concerns about/anxiety over coronavirus.  No recent travel or surgeries, no hemoptysis, no prior history of DVT or PE.     The history is provided by the patient.    Past Medical History:  Diagnosis Date  . Fibromyalgia   . Fibromyalgia   . Hypertension   . Osteoarthritis   . Rheumatoid arthritis(714.0)   . Rheumatoid arthritis(714.0)     Patient Active Problem List   Diagnosis Date Noted  . Morbid obesity ([place]) 01/10/2019  . Prediabetes 01/10/2019  . Tobacco use disorder, continuous 01/10/2019  . Soft tissue swelling of back 01/10/2019  . Fibromyalgia 07/27/2017  . Other fatigue 07/27/2017  . History of hidradenitis suppurativa 07/27/2017  . History of hypertension 07/27/2017  . History of hyperlipidemia 07/27/2017  . Smoker 07/27/2017  . High risk medication use 08/21/2016  . Rheumatoid arthritis of multiple sites without rheumatoid factor ([place]) 08/12/2016  . ARTHRITIS, GENERALIZED 07/14/2010  . HYPERTENSION, BENIGN ESSENTIAL  03/04/2009  . POLYURIA 03/04/2009  . FIBROMYALGIA 11/09/2007  . HIDRADENITIS SUPPURATIVA 07/11/2007  . Mixed hyperlipidemia 06/28/2007  . LEG EDEMA, BILATERAL 05/05/2007    History reviewed. No pertinent surgical history.   OB History   No obstetric history on file.      Home Medications    Prior to Admission medications   Medication Sig Start Date End Date Taking? Authorizing Provider  amLODipine (NORVASC) 10 MG tablet Take 1 tablet (10 mg total) by mouth daily. 01/10/19  Yes Minette Brine, FNP  diclofenac sodium (VOLTAREN) 1 % GEL Apply 2 grams to 4 grams topically to affected area up to 4 times daily PRN Patient taking differently: Apply 2-4 g topically 4 (four) times daily as needed (joint pain).  01/05/19  Yes Ofilia Neas, PA-C  DULoxetine (CYMBALTA) 60 MG capsule Take 60 mg by mouth daily.     Yes [provider]  ezetimibe (ZETIA) 10 MG tablet Take 1 tablet (10 mg total) by mouth daily. 01/10/19  Yes Minette Brine, FNP  folic acid (FOLVITE) 1 MG tablet Take 2 tablets (2 mg total) by mouth daily. 01/05/19  Yes Ofilia Neas, PA-C  HUMIRA PEN 40 MG/0.8ML PNKT INJECT 1 PEN INTO THE SKIN EVERY 14 DAYS Patient taking differently: Inject 40 mg into the muscle every 14 (fourteen) days.  02/02/19  Yes Deveshwar, Abel Presto, MD  metFORMIN (GLUCOPHAGE) 500 MG tablet TAKE 1 TABLET BY MOUTH ONCE DAILY WITH  MORNING  AND  EVENING  MEALS Patient taking differently: Take 500 mg by mouth 2 (two) times daily with a meal.  02/08/19  Yes Minette Brine, FNP  methotrexate 50  MG/2ML injection Inject 0.8 ml subcutaneously once weekly. Patient taking differently: Inject 20 mg into the skin once a week.  01/05/19  Yes Ofilia Neas, PA-C  metoprolol tartrate (LOPRESSOR) 50 MG tablet Take 1 tablet (50 mg total) by mouth 2 (two) times daily. Please schedule appointment for refills. 01/10/19  Yes Minette Brine, FNP  tiZANidine (ZANAFLEX) 4 MG tablet Take 4 mg by mouth every 6 (six) hours as needed (for  muscle spasms).    Yes [provider]  Tuberculin-Allergy Syringes 27G X 1/2" 1 ML MISC Inject 1 Syringe into the skin once a week. 10/28/16  Yes Bo Merino, MD    Family History Family History  Problem Relation Age of Onset  . Diabetes Mother   . Healthy Father   . Heart attack Maternal Grandmother   . Stroke Maternal Grandmother     Social History Social History   Tobacco Use  . Smoking status: Current Every Day Smoker    Packs/day: 0.50    Years: 20.00    Pack years: 10.00    Types: Cigarettes  . Smokeless tobacco: Never Used  Substance Use Topics  . Alcohol use: Not Currently  . Drug use: Not Currently     Allergies   Patient has no known allergies.   Review of Systems Review of Systems  Constitutional: Negative for chills, diaphoresis and fever.  Respiratory: Negative for cough and shortness of breath.   Cardiovascular: Positive for chest pain.  Gastrointestinal: Negative for abdominal pain, nausea and vomiting.  Neurological: Negative for syncope and light-headedness.  All other systems reviewed and are negative.    Physical Exam Updated Vital Signs BP 117/82 (BP Location: Right Arm)   Pulse 90   Temp 97.6 F (36.4 C) (Oral)   Resp 16   Ht 5\' 5"  (1.651 m)   Wt 134.7 kg   LMP 01/12/2019 (Approximate)   SpO2 100%   BMI 49.42 kg/m   Physical Exam Vitals signs and nursing note reviewed.  Constitutional:      General: She is not in acute distress.    Appearance: She is well-developed. She is obese.  HENT:     Head: Normocephalic and atraumatic.  Eyes:     General:        Right eye: No discharge.        Left eye: No discharge.     Conjunctiva/sclera: Conjunctivae normal.  Neck:     Vascular: No JVD.     Trachea: No tracheal deviation.  Cardiovascular:     Rate and Rhythm: Normal rate and regular rhythm.     Pulses:          Radial pulses are 2+ on the right side and 2+ on the left side.       Dorsalis pedis pulses are 2+ on  the right side and 2+ on the left side.       Posterior tibial pulses are 2+ on the right side and 2+ on the left side.     Heart sounds: Normal heart sounds.     Comments: Homans sign absent bilaterally, compartments are soft, no palpable cords Pulmonary:     Effort: Pulmonary effort is normal.     Comments: Soft expiratory wheezes.  Speaking in full sentences without difficulty. Chest:     Comments: Mild tenderness to palpation of the right lateral chest wall with no deformity, crepitus, ecchymosis, or flail segment noted Abdominal:     General: There is no distension.  Palpations: Abdomen is soft.     Tenderness: There is no abdominal tenderness. There is no guarding or rebound.  Musculoskeletal:     Right lower leg: She exhibits no tenderness. No edema.     Left lower leg: She exhibits no tenderness. No edema.  Skin:    General: Skin is warm and dry.     Findings: No erythema.  Neurological:     Mental Status: She is alert.  Psychiatric:        Behavior: Behavior normal.      ED Treatments / Results  Labs (all labs ordered are listed, but only abnormal results are displayed) Labs Reviewed  BASIC METABOLIC PANEL - Abnormal; Notable for the following components:      Result Value   CO2 20 (*)    Glucose, Bld 130 (*)    All other components within normal limits  CBC WITH DIFFERENTIAL/PLATELET  TROPONIN I  I-STAT BETA HCG BLOOD, ED ([facility], [facility], [facility] ONLY)    ED ECG REPORT   Date: 02/08/2019  Rate: 80  Rhythm: normal sinus rhythm  QRS Axis: normal  Intervals: normal  ST/T Wave abnormalities: normal  Conduction Disutrbances:none  Narrative Interpretation:   Old EKG Reviewed: unchanged  I have personally reviewed the EKG tracing and agree with the computerized printout as noted.   Radiology Dg Chest 2 View  Result Date: 02/08/2019 CLINICAL DATA:  Sharp chest and upper back pain for 3 days. History of diabetes and hypertension. EXAM: CHEST - 2 VIEW COMPARISON:   Radiographs 10/18/2018.  CT 10/19/2018. FINDINGS: The heart size and mediastinal contours are stable. There is stable anterior eventration of the right hemidiaphragm and associated linear bibasilar atelectasis or scarring. There is no confluent airspace opacity, pleural effusion or pneumothorax. No acute osseous findings are evident. Telemetry leads overlie the chest. IMPRESSION: Stable chest without evidence of acute cardiopulmonary process. Electronically Signed   By: Richardean Sale M.D.   On: 02/08/2019 14:50    Procedures Procedures (including critical care time)  Medications Ordered in ED Medications  albuterol (VENTOLIN HFA) 108 (90 Base) MCG/ACT inhaler 2 puff (2 puffs Inhalation Given 02/08/19 1308)  ketorolac (TORADOL) 30 MG/ML injection 30 mg (30 mg Intramuscular Given 02/08/19 1320)     Initial Impression / Assessment and Plan / ED Course  I have reviewed the triage vital signs and the nursing notes.  Pertinent labs & imaging results that were available during my care of the patient were reviewed by me and considered in my medical decision making (see chart for details).        Patient presents for evaluation of constant chest pains for 3 days.  She is afebrile, vital signs are stable.  She is nontoxic in appearance.  The pain is not pleuritic or exertional and is somewhat reproducible on palpation.  Chest x-ray shows no acute cardiopulmonary abnormalities.  Her EKG reviewed by me shows normal sinus rhythm, no acute ischemic changes.  Lab work shows no leukocytosis, no anemia, no metabolic derangements, no renal insufficiency.  Troponin was obtained which was negative.  As her symptoms have been constant for 3 days I do not feel that serial troponins are warranted.  Doubt ACS/MI, PE, dissection, cardiac tamponade, esophageal rupture, or pneumothorax.  No evidence of pneumonia or effusion on imaging today.  She reports improvement with albuterol inhaler and Toradol.  Recommend  follow-up with PCP for reevaluation of symptoms.  No further emergent work-up required at this time.  Discussed strict ED return  precautions. Patient verbalized understanding of and agreement with plan and is safe for discharge home at this time.   Final Clinical Impressions(s) / ED Diagnoses   Final diagnoses:  Atypical chest pain    ED Discharge Orders    None       Debroah Baller 02/09/19 0813    Duffy Bruce, MD 02/09/19 [phone]

## 2019-02-08 NOTE — Discharge Instructions (Signed)
Continue taking your home medicines as prescribed.  You can also take 600 mg ibuprofen every 6 hours as needed for pain additionally.  Use the albuterol inhaler 1 to 2 puffs every 4-6 hours as needed for shortness of breath.  Follow-up with your primary care physician for reevaluation of your symptoms.  Return to the emergency department immediately if any concerning signs or symptoms develop such as frequently using the albuterol inhaler more than prescribed, persistent chest pains, worsening shortness of breath, persistent vomiting, or severe abdominal pains.

## 2019-02-12 ENCOUNTER — Telehealth: Payer: Self-pay

## 2019-02-12 NOTE — Telephone Encounter (Signed)
Called to schedule er follow up. No answer left message for pt to call back

## 2019-02-13 DIAGNOSIS — M545 Low back pain: Secondary | ICD-10-CM | POA: Diagnosis not present

## 2019-02-13 DIAGNOSIS — M25512 Pain in left shoulder: Secondary | ICD-10-CM | POA: Diagnosis not present

## 2019-02-13 DIAGNOSIS — M79604 Pain in right leg: Secondary | ICD-10-CM | POA: Diagnosis not present

## 2019-02-13 DIAGNOSIS — M25561 Pain in right knee: Secondary | ICD-10-CM | POA: Diagnosis not present

## 2019-02-13 DIAGNOSIS — G894 Chronic pain syndrome: Secondary | ICD-10-CM | POA: Diagnosis not present

## 2019-02-13 DIAGNOSIS — M25521 Pain in right elbow: Secondary | ICD-10-CM | POA: Diagnosis not present

## 2019-02-13 DIAGNOSIS — I10 Essential (primary) hypertension: Secondary | ICD-10-CM | POA: Diagnosis not present

## 2019-02-13 DIAGNOSIS — M79672 Pain in left foot: Secondary | ICD-10-CM | POA: Diagnosis not present

## 2019-02-13 DIAGNOSIS — M79605 Pain in left leg: Secondary | ICD-10-CM | POA: Diagnosis not present

## 2019-02-13 DIAGNOSIS — M25571 Pain in right ankle and joints of right foot: Secondary | ICD-10-CM | POA: Diagnosis not present

## 2019-02-13 DIAGNOSIS — M542 Cervicalgia: Secondary | ICD-10-CM | POA: Diagnosis not present

## 2019-02-13 DIAGNOSIS — M79671 Pain in right foot: Secondary | ICD-10-CM | POA: Diagnosis not present

## 2019-02-15 ENCOUNTER — Ambulatory Visit: Payer: Self-pay

## 2019-02-15 NOTE — Chronic Care Management (AMB) (Signed)
  Chronic Care Management   Telephone Outreach Note  02/15/2019 Name: Nidia Grogan MRN: [id number] DOB: September 29, 1971  Referred by: patient's health plan.  Unsuccessful call to Ms. [name] today by phone in response to a referral sent by Ms. Davona Champlain's health plan.  A HIPPA compliant phone message was left for the patient providing contact information and requesting a return call.  The CM team will reach out to the patient again over the next 7-10 days.    Minette Brine, FNP has been notified of this outreach and plan.   Daneen Schick, BSW, CDP [facility] Care Management Social Worker [phone]  Total time spent performing care coordination and/or care management activities with the patient by phone or face to face = 3 minutes.

## 2019-02-19 ENCOUNTER — Ambulatory Visit: Payer: Self-pay

## 2019-02-19 DIAGNOSIS — E782 Mixed hyperlipidemia: Secondary | ICD-10-CM

## 2019-02-19 DIAGNOSIS — R7303 Prediabetes: Secondary | ICD-10-CM

## 2019-02-19 DIAGNOSIS — I10 Essential (primary) hypertension: Secondary | ICD-10-CM

## 2019-02-19 NOTE — Chronic Care Management (AMB) (Signed)
  Chronic Care Management   Outreach Note  02/19/2019 Name: Shelby Robertson MRN: [id number] DOB: [birth year]  Referred by: Patients health plan Reason for referral : Care Coordination   A second unsuccessful telephone outreach was attempted today. The patient was referred to the case management team for assistance with chronic care management and care coordination.   Follow Up Plan: A HIPPA compliant phone message was left for the patient providing contact information and requesting a return call.  The CM team will reach out to the patient again over the next 7-10 days.   Daneen Schick, BSW, CDP [facility] Care Management Social Worker [phone]  Total time spent performing care coordination and/or care management activities with the patient by phone or face to face = 5 minutes.

## 2019-02-22 ENCOUNTER — Encounter: Payer: Self-pay | Admitting: Nurse Practitioner

## 2019-02-23 ENCOUNTER — Ambulatory Visit: Payer: Self-pay

## 2019-02-23 DIAGNOSIS — R7303 Prediabetes: Secondary | ICD-10-CM

## 2019-02-23 DIAGNOSIS — M0609 Rheumatoid arthritis without rheumatoid factor, multiple sites: Secondary | ICD-10-CM

## 2019-02-23 DIAGNOSIS — I10 Essential (primary) hypertension: Secondary | ICD-10-CM

## 2019-02-23 DIAGNOSIS — E782 Mixed hyperlipidemia: Secondary | ICD-10-CM

## 2019-02-23 NOTE — Patient Instructions (Signed)
Social Worker Visit Information  Goals we discussed today:  Goals Addressed            This Visit's Progress     Patient Stated   . "I would like to name my son as health care power of attorney" (pt-stated)       Current Barriers:  . Limited education about the importance of naming a healthcare power of attorney  Clinical Social Work Clinical Goal(s):  . Over the next 20 days, the patient will review mailed EMMI education on Advance Directive . Over the next 30 days, patient will verbalize basic understanding of Advanced Directives and importance of completion . Over the next 30 days, the patient will complete mailed Advance Directive packet with the assistance of SW  Interventions: . Interviewed patient about Advanced Directives and provided education about the importance of completing advanced directives . Mailed the patient an EMMI educational handout on Advance Directives as well as an Advance Directive packet . Advised patient to review information mailed by this SW  Patient Self Care Activities:  . Self administers medications as prescribed . Attends all scheduled provider appointments . Calls provider office for new concerns or questions  Initial goal documentation           Materials provided: Yes: mailed information on advance directives  Ms. Wahba was given information about Chronic Care Management services today including:  1. CCM service includes personalized support from designated clinical staff supervised by her physician, including individualized plan of care and coordination with other care providers 2. 24/7 contact phone numbers for assistance for urgent and routine care needs. 3. Service will only be billed when office clinical staff spend 20 minutes or more in a month to coordinate care. 4. Only one practitioner may furnish and bill the service in a calendar month. 5. The patient may stop CCM services at any time (effective at the end of the month)  by phone call to the office staff. 6. The patient will be responsible for cost sharing (co-pay) of up to 20% of the service fee (after annual deductible is met).  Patient agreed to services and verbal consent obtained.   The patient verbalized understanding of instructions provided today and declined a print copy of patient instruction materials.   Follow up plan: SW will follow up with patient by phone over the next 3-4 weeks    , BSW, CDP [facility] Care Management Social Worker [phone]         

## 2019-02-23 NOTE — Chronic Care Management (AMB) (Signed)
Chronic Care Management   Telephone Outreach Note  02/23/2019 Name: Athanasia Stanwood MRN: [id number] DOB: [birth year]  Referred by: patient's health plan.   I reached out to Ms. [name] today by phone in response to a referral sent by Ms. Levaeh Vandervort's health plan. Ms. Haili Donofrio and I briefly discussed care management needs related to rheumatoid arthritis.  Ms. Halvorsen was given information about Chronic Care Management services today including:  1. CCM service includes personalized support from designated clinical staff supervised by her physician, including individualized plan of care and coordination with other care providers 2. 24/7 contact phone numbers for assistance for urgent and routine care needs. 3. Service will only be billed when office clinical staff spend 20 minutes or more in a month to coordinate care. 4. Only one practitioner may furnish and bill the service in a calendar month. 5. The patient may stop CCM services at any time (effective at the end of the month) by phone call to the office staff. 6. The patient will be responsible for cost sharing (co-pay) of up to 20% of the service fee (after annual deductible is met).  Patient agreed to services and verbal consent obtained.     SDOH (Social Determinants of Health) screening performed today. The patient reports challenges with her current housing but declines SW intervention. The patient reports she currently owes her apartment manager [phone] due to the patients decision to stop paying rent because "they never fix things all the way". The patient acknowledges she would like to move but can not until she pays all the rent owed due to section 8 policy. The patient also reports concern with high utility cost but acknowledges her son is able to help if needed. SW educated the patient on [employer] who may be able to assist if she receives a disconnect notice.   The patient would like to complete  an advance directive in order to name her son health care power of attorney.  Objective:   Goals Addressed            This Visit's Progress     Patient Stated   . "I would like to name my son as health care power of attorney" (pt-stated)       Current Barriers:  . Limited education about the importance of naming a healthcare power of attorney  Clinical Social Work Clinical Goal(s):  [name] Over the next 20 days, the patient will review mailed EMMI education on Advance Directive . Over the next 30 days, patient will verbalize basic understanding of Advanced Directives and importance of completion . Over the next 30 days, the patient will complete mailed Advance Directive packet with the assistance of SW  Interventions: . Interviewed patient about Financial controller and provided education about the importance of completing advanced directives . Mailed the patient an EMMI educational handout on Advance Directives as well as an Emergency planning/management officer . Advised patient to review information mailed by this SW  Patient Self Care Activities:  . Self administers medications as prescribed . Attends all scheduled provider appointments . Calls provider office for new concerns or questions  Initial goal documentation       The CM team will reach out to the patient again over the next 3-4 weeks.  The patient has been provided with contact information for the chronic care management team and has been advised to call with any health related questions or concerns.    Minette Brine, FNP has been notified  of this outreach and Ms. Adyline Halbert's decision and plan.   Daneen Schick, BSW, CDP [facility] Care Management Social Worker [phone]  Total time spent performing care coordination and/or care management activities with the patient by phone or face to face = 31 minutes.

## 2019-02-23 NOTE — Chronic Care Management (AMB) (Signed)
  Chronic Care Management   Initial Visit Note  02/22/2019 Name: Shelby Robertson MRN: [id number] DOB: 23-Sep-1971  Referred by: Minette Brine, FNP Reason for referral : Care Coordination   Shelby Robertson is a 48 y.o. year old female who is a primary care patient of Minette Brine, [place]. The CCM team was consulted for assistance with chronic disease management and care coordination needs.   Review of patient status, including review of consultants reports, relevant laboratory and other test results, and collaboration with appropriate care team members and the patient's provider was performed as part of comprehensive patient evaluation and provision of chronic care management services.    I initiated and established the plan of care for Shelby Robertson during one on one collaboration with my clinical care management colleague Daneen Schick BSW who is also engaged with this patient to address social work needs.   Goals Addressed      Patient Stated   . "I would like to name my son as health care power of attorney" (pt-stated)       Current Barriers:  . Limited education about the importance of naming a healthcare power of attorney  Clinical Social Work Clinical Goal(s):  Shelby Robertson Over the next 20 days, the patient will review mailed EMMI education on Advance Directive . Over the next 30 days, patient will verbalize basic understanding of Advanced Directives and importance of completion . Over the next 30 days, the patient will complete mailed Advance Directive packet with the assistance of SW  Interventions: . Interviewed patient about Financial controller and provided education about the importance of completing advanced directives . Mailed the patient an EMMI educational handout on Advance Directives as well as an Emergency planning/management officer . Advised patient to review information mailed by this SW  Patient Self Care Activities:  . Self administers medications as prescribed . Attends all scheduled  provider appointments . Calls provider office for new concerns or questions  Initial goal documentation       Other   . Assist with Chronic Disease Management and Care Coordination        Current Barriers:  Shelby Robertson Knowledge Barriers related to resources and support available to address needs related to Chronic Disease Management and Care Coordination Services  Case Manager Clinical Goal(s):  Shelby Robertson Over the next 30 days, patient will work with the CCM team to address needs related to Chronic Disease Management and [employer].   Interventions:  . Collaborated with BSW and initiated plan of care to address needs related to Advanced Directives and patient specified chronic disease management.   Patient Self Care Activities:  . Self administers medications as prescribed . Attends all scheduled provider appointments . Calls provider office for new concerns or questions  Initial goal documentation         Telephone follow up appointment with CCM team member scheduled for: week of 02/26/19  Barb Merino, [facility] Care Management Coordinator [place] [facility]  Direct Phone: [phone]

## 2019-03-02 ENCOUNTER — Other Ambulatory Visit: Payer: Self-pay

## 2019-03-02 ENCOUNTER — Telehealth: Payer: Medicare Other

## 2019-03-02 ENCOUNTER — Ambulatory Visit (INDEPENDENT_AMBULATORY_CARE_PROVIDER_SITE_OTHER): Payer: Medicare Other

## 2019-03-02 DIAGNOSIS — E782 Mixed hyperlipidemia: Secondary | ICD-10-CM | POA: Diagnosis not present

## 2019-03-02 DIAGNOSIS — M0609 Rheumatoid arthritis without rheumatoid factor, multiple sites: Secondary | ICD-10-CM | POA: Diagnosis not present

## 2019-03-02 DIAGNOSIS — I10 Essential (primary) hypertension: Secondary | ICD-10-CM

## 2019-03-02 DIAGNOSIS — R7303 Prediabetes: Secondary | ICD-10-CM

## 2019-03-05 NOTE — Chronic Care Management (AMB) (Signed)
  Chronic Care Management   Initial Visit Note  03/02/2019 Name: Sachi Boulay MRN: [id number] DOB: 1970/11/20  Referred by: Minette Brine, FNP Reason for referral : Chronic Care Management (INITIAL CCM RN Telephone Outreach )   Olivine Hiers is a 48 y.o. year old female who is a primary care patient of Minette Brine, [place]. The CCM team was consulted for assistance with chronic disease management and care coordination needs.   Review of patient status, including review of consultants reports, relevant laboratory and other test results, and collaboration with appropriate care team members and the patient's provider was performed as part of comprehensive patient evaluation and provision of chronic care management services.    I spoke with Ms. Quentin Cornwall by telephone today for her initial CCM RN Follow Up to establish her plan of care.   Objective:  Lab Results  Component Value Date   HGBA1C 5.9 (H) 01/10/2019   Lab Results  Component Value Date   LDLCALC 127 (H) 01/10/2019   CREATININE 0.81 02/08/2019   BP Readings from Last 3 Encounters:  02/08/19 117/82  01/10/19 118/76  01/10/19 118/76    Goals Addressed      Patient Stated   . "I would like to learn more about Diabetes" (pt-stated)       Current Barriers:  [name] Knowledge Deficits related to disease process and Self Health Management of Diabetes  Nurse Case Manager Clinical Goal(s):  [name] Over the next 30 days, patient will verbalize basic understanding of Diabetes disease process and self health management plan as evidenced by patient will review printed educational materials mailed by [facility] and will verbalize having a basic understanding of this disease process and how to improve and or maintain current A1C.   Interventions:   Completed initial CCM RN Telephone Follow Up with patient to establish plan of care . Evaluation of current treatment plan related to prediabetes and patient's adherence to plan as established by  provider. . Provided education to patient re: current A1C; discussed target A1C . Reviewed medications with patient and discussed importance of taking medications exactly as prescribed w/o missed doses for best effectiveness . Discussed plans with patient for ongoing care management follow up and provided patient with direct contact information for care management team . Provided patient with printed  educational materials related to diabetes disease process; meal planning using the plate method; know your A1C; Signs and Symptoms of Hypo/Hyperglycemia   . Provided contact # for [facility] and discussed hours of nurse availability  . Scheduled a CCM follow up call with patient in about 2 weeks  Patient Self Care Activities:  . Self administers medications as prescribed . Attends all scheduled provider appointments . Calls pharmacy for medication refills . Attends church or other social activities . Performs ADL's independently . Performs IADL's independently . Calls provider office for new concerns or questions  Initial goal documentation        The CCM team will reach out to the patient again over the next 14 days.   Barb Merino, RN,CCM Care Management Coordinator [place] [facility]  Direct Phone: [phone]

## 2019-03-05 NOTE — Patient Instructions (Signed)
Visit Information  Goals Addressed      Patient Stated   . "I would like to learn more about Diabetes" (pt-stated)       Current Barriers:  [name] Knowledge Deficits related to disease process and Self Health Management of Diabetes  Nurse Case Manager Clinical Goal(s):  [name] Over the next 30 days, patient will verbalize basic understanding of Diabetes disease process and self health management plan as evidenced by patient will review printed educational materials mailed by [facility] and will verbalize having a basic understanding of this disease process and how to improve and or maintain current A1C.   Interventions:   Completed initial CCM RN Telephone Follow Up with patient to establish plan of care . Evaluation of current treatment plan related to prediabetes and patient's adherence to plan as established by provider. . Provided education to patient re: current A1C; discussed target A1C . Reviewed medications with patient and discussed importance of taking medications exactly as prescribed w/o missed doses for best effectiveness . Discussed plans with patient for ongoing care management follow up and provided patient with direct contact information for care management team . Provided patient with printed  educational materials related to diabetes disease process; meal planning using the plate method; know your A1C; Signs and Symptoms of Hypo/Hyperglycemia   . Provided contact # for [facility] and discussed hours of nurse availability  . Scheduled a CCM follow up call with patient in about 2 weeks  Patient Self Care Activities:  . Self administers medications as prescribed . Attends all scheduled provider appointments . Calls pharmacy for medication refills . Attends church or other social activities . Performs ADL's independently . Performs IADL's independently . Calls provider office for new concerns or questions  Initial goal documentation        The patient verbalized understanding of  instructions provided today and declined a print copy of patient instruction materials.   The CCM team will reach out to the patient again over the next 14 days.   Barb Merino, RN,CCM Care Management Coordinator [place] [facility]  Direct Phone: [phone]

## 2019-03-15 ENCOUNTER — Ambulatory Visit: Payer: Medicare Other

## 2019-03-15 DIAGNOSIS — M25512 Pain in left shoulder: Secondary | ICD-10-CM | POA: Diagnosis not present

## 2019-03-15 DIAGNOSIS — M79672 Pain in left foot: Secondary | ICD-10-CM | POA: Diagnosis not present

## 2019-03-15 DIAGNOSIS — M79605 Pain in left leg: Secondary | ICD-10-CM | POA: Diagnosis not present

## 2019-03-15 DIAGNOSIS — E1141 Type 2 diabetes mellitus with diabetic mononeuropathy: Secondary | ICD-10-CM | POA: Diagnosis not present

## 2019-03-15 DIAGNOSIS — M0609 Rheumatoid arthritis without rheumatoid factor, multiple sites: Secondary | ICD-10-CM | POA: Diagnosis not present

## 2019-03-15 DIAGNOSIS — M79604 Pain in right leg: Secondary | ICD-10-CM | POA: Diagnosis not present

## 2019-03-15 DIAGNOSIS — I10 Essential (primary) hypertension: Secondary | ICD-10-CM | POA: Diagnosis not present

## 2019-03-15 DIAGNOSIS — M79671 Pain in right foot: Secondary | ICD-10-CM | POA: Diagnosis not present

## 2019-03-15 DIAGNOSIS — M545 Low back pain: Secondary | ICD-10-CM | POA: Diagnosis not present

## 2019-03-15 DIAGNOSIS — E782 Mixed hyperlipidemia: Secondary | ICD-10-CM

## 2019-03-15 DIAGNOSIS — G4733 Obstructive sleep apnea (adult) (pediatric): Secondary | ICD-10-CM | POA: Diagnosis not present

## 2019-03-15 DIAGNOSIS — G894 Chronic pain syndrome: Secondary | ICD-10-CM | POA: Diagnosis not present

## 2019-03-15 DIAGNOSIS — M1712 Unilateral primary osteoarthritis, left knee: Secondary | ICD-10-CM | POA: Diagnosis not present

## 2019-03-15 DIAGNOSIS — M25561 Pain in right knee: Secondary | ICD-10-CM | POA: Diagnosis not present

## 2019-03-15 NOTE — Chronic Care Management (AMB) (Signed)
  Chronic Care Management    Clinical Social Work Follow Up Note  03/15/2019 Name: Shelby Robertson MRN: [id number] DOB: Dec 16, 1970  Shelby Robertson is a 48 y.o. year old female who is a primary care patient of Minette Brine, [place]. The CCM team was consulted for assistance with Advanced Directive Education.   Review of patient status, including review of consultants reports, other relevant assessments, and collaboration with appropriate care team members and the patient's provider was performed as part of comprehensive patient evaluation and provision of chronic care management services.     I placed a successful outreach call to the patient to assess progress of patient stated goal.  Goals Addressed            This Visit's Progress     Patient Stated   . COMPLETED: "I would like to name my son as health care power of attorney" (pt-stated)       Current Barriers:  . Limited education about the importance of naming a healthcare power of attorney  Clinical Social Work Clinical Goal(s):  [name] Over the next 20 days, the patient will review mailed EMMI education on Advance Directive . Over the next 30 days, patient will verbalize basic understanding of Advanced Directives and importance of completion . Over the next 30 days, the patient will complete mailed Advance Directive packet with the assistance of SW  Interventions: . Outbound call to the patient to confirm receipt of mailed Advance Directive packet . Confirmed the patient did receive the documents but has yet to complete . Assessed the patient for knowledge of Advance Directives - determined the patient does not have any questions on how to complete the packet and does not desire SW assistance for completion . Advised the patient that once completed documents are notarized to provide a copy to any persons she names to be her healthcare agent as well as to her physician's office . Encouraged the patient to outreach CCM SW if future  assistance is needed  Patient Self Care Activities:  . Self administers medications as prescribed . Attends all scheduled provider appointments . Calls provider office for new concerns or questions  Please see past updates related to this goal by clicking on the "Past Updates" button in the selected goal            Follow Up Plan: No further follow up needed at this time. The patient is encouraged to contact SW for any future SW needs.  Daneen Schick, BSW, CDP Social Worker, Certified Dementia Practitioner [place] [facility] [place] [facility] [phone]  Total time spent performing care coordination and/or care management activities with the patient by phone or face to face = 7 minutes.

## 2019-03-15 NOTE — Patient Instructions (Signed)
Social Worker Visit Information  Goals we discussed today:  Goals Addressed            This Visit's Progress     Patient Stated   . COMPLETED: "I would like to name my son as health care power of attorney" (pt-stated)       Current Barriers:  . Limited education about the importance of naming a healthcare power of attorney  Clinical Social Work Clinical Goal(s):  [name] Over the next 20 days, the patient will review mailed EMMI education on Advance Directive . Over the next 30 days, patient will verbalize basic understanding of Advanced Directives and importance of completion . Over the next 30 days, the patient will complete mailed Advance Directive packet with the assistance of SW  Interventions: . Outbound call to the patient to confirm receipt of mailed Advance Directive packet . Confirmed the patient did receive the documents but has yet to complete . Assessed the patient for knowledge of Advance Directives - determined the patient does not have any questions on how to complete the packet and does not desire SW assistance for completion . Advised the patient that once completed documents are notarized to provide a copy to any persons she names to be her healthcare agent as well as to her physician's office . Encouraged the patient to outreach CCM SW if future assistance is needed  Patient Self Care Activities:  . Self administers medications as prescribed . Attends all scheduled provider appointments . Calls provider office for new concerns or questions  Please see past updates related to this goal by clicking on the "Past Updates" button in the selected goal            Materials Provided: Verbal education about advance directives provided by phone  Follow Up Plan: No follow up needed at this time.   Daneen Schick, BSW, CDP Social Worker, Certified Dementia Practitioner [place] [facility] [place] [facility] [phone]

## 2019-03-19 ENCOUNTER — Telehealth: Payer: Self-pay

## 2019-04-12 ENCOUNTER — Ambulatory Visit (INDEPENDENT_AMBULATORY_CARE_PROVIDER_SITE_OTHER): Payer: Medicare Other | Admitting: Nurse Practitioner

## 2019-04-12 ENCOUNTER — Encounter: Payer: Self-pay | Admitting: Nurse Practitioner

## 2019-04-12 ENCOUNTER — Other Ambulatory Visit: Payer: Self-pay

## 2019-04-12 VITALS — BP 118/68 | HR 98 | Temp 97.7°F | Ht 65.4 in | Wt 313.8 lb

## 2019-04-12 DIAGNOSIS — R600 Localized edema: Secondary | ICD-10-CM | POA: Diagnosis not present

## 2019-04-12 DIAGNOSIS — M25521 Pain in right elbow: Secondary | ICD-10-CM | POA: Diagnosis not present

## 2019-04-12 DIAGNOSIS — M25561 Pain in right knee: Secondary | ICD-10-CM | POA: Diagnosis not present

## 2019-04-12 DIAGNOSIS — Z720 Tobacco use: Secondary | ICD-10-CM | POA: Diagnosis not present

## 2019-04-12 DIAGNOSIS — F17209 Nicotine dependence, unspecified, with unspecified nicotine-induced disorders: Secondary | ICD-10-CM | POA: Diagnosis not present

## 2019-04-12 DIAGNOSIS — R7303 Prediabetes: Secondary | ICD-10-CM | POA: Diagnosis not present

## 2019-04-12 DIAGNOSIS — I10 Essential (primary) hypertension: Secondary | ICD-10-CM

## 2019-04-12 DIAGNOSIS — M1712 Unilateral primary osteoarthritis, left knee: Secondary | ICD-10-CM | POA: Diagnosis not present

## 2019-04-12 DIAGNOSIS — M79671 Pain in right foot: Secondary | ICD-10-CM | POA: Diagnosis not present

## 2019-04-12 DIAGNOSIS — E782 Mixed hyperlipidemia: Secondary | ICD-10-CM | POA: Diagnosis not present

## 2019-04-12 DIAGNOSIS — G4733 Obstructive sleep apnea (adult) (pediatric): Secondary | ICD-10-CM | POA: Diagnosis not present

## 2019-04-12 DIAGNOSIS — M79605 Pain in left leg: Secondary | ICD-10-CM | POA: Diagnosis not present

## 2019-04-12 DIAGNOSIS — M545 Low back pain: Secondary | ICD-10-CM | POA: Diagnosis not present

## 2019-04-12 DIAGNOSIS — M25512 Pain in left shoulder: Secondary | ICD-10-CM | POA: Diagnosis not present

## 2019-04-12 DIAGNOSIS — M79672 Pain in left foot: Secondary | ICD-10-CM | POA: Diagnosis not present

## 2019-04-12 DIAGNOSIS — E1141 Type 2 diabetes mellitus with diabetic mononeuropathy: Secondary | ICD-10-CM | POA: Diagnosis not present

## 2019-04-12 DIAGNOSIS — M79604 Pain in right leg: Secondary | ICD-10-CM | POA: Diagnosis not present

## 2019-04-12 MED ORDER — HYDROCHLOROTHIAZIDE 12.5 MG PO TABS: 12.5000 mg | ORAL_TABLET | Freq: Every day | ORAL | 2 refills | Status: DC | PRN

## 2019-04-12 NOTE — Progress Notes (Signed)
Subjective:     Patient ID: Shelby Robertson , female    DOB: September 20, 1971 , 48 y.o.   MRN: [id number]   Chief Complaint  Patient presents with  . PREDIABETES    HPI  Diabetes She presents for her follow-up diabetic visit. She has type 2 diabetes mellitus. There are no hypoglycemic associated symptoms. Pertinent negatives for hypoglycemia include no confusion, dizziness, headaches or nervousness/anxiousness. Pertinent negatives for diabetes include no blurred vision, no chest pain, no fatigue, no polydipsia, no polyphagia, no polyuria and no weakness. There are no hypoglycemic complications. Symptoms are stable. There are no diabetic complications. Risk factors for coronary artery disease include sedentary lifestyle and obesity.     Past Medical History:  Diagnosis Date  . Fibromyalgia   . Fibromyalgia   . Hypertension   . Osteoarthritis   . Rheumatoid arthritis(714.0)   . Rheumatoid arthritis(714.0)      Family History  Problem Relation Age of Onset  . Diabetes Mother   . Healthy Father   . Heart attack Maternal Grandmother   . Stroke Maternal Grandmother      Current Outpatient Medications:  .  amLODipine (NORVASC) 10 MG tablet, Take 1 tablet (10 mg total) by mouth daily., Disp: 90 tablet, Rfl: 1 .  diclofenac sodium (VOLTAREN) 1 % GEL, Apply 2 grams to 4 grams topically to affected area up to 4 times daily PRN, Disp: 4 Tube, Rfl: 2 .  DULoxetine (CYMBALTA) 60 MG capsule, Take 60 mg by mouth daily.  , Disp: , Rfl:  .  ezetimibe (ZETIA) 10 MG tablet, Take 1 tablet (10 mg total) by mouth daily., Disp: 90 tablet, Rfl: 1 .  folic acid (FOLVITE) 1 MG tablet, Take 2 tablets (2 mg total) by mouth daily., Disp: 180 tablet, Rfl: 1 .  HUMIRA PEN 40 MG/0.8ML PNKT, INJECT 1 PEN INTO THE SKIN EVERY 14 DAYS (Patient taking differently: Inject 40 mg into the muscle every 14 (fourteen) days. ), Disp: 6 each, Rfl: 0 .  metFORMIN (GLUCOPHAGE) 500 MG tablet, TAKE 1 TABLET BY MOUTH ONCE DAILY  WITH  MORNING  AND  EVENING  MEALS (Patient taking differently: Take 500 mg by mouth 2 (two) times daily with a meal. ), Disp: 180 tablet, Rfl: 1 .  methotrexate 50 MG/2ML injection, Inject 0.8 ml subcutaneously once weekly. (Patient taking differently: Inject 20 mg into the skin once a week. ), Disp: 10 mL, Rfl: 0 .  metoprolol tartrate (LOPRESSOR) 50 MG tablet, Take 1 tablet (50 mg total) by mouth 2 (two) times daily. Please schedule appointment for refills., Disp: 180 tablet, Rfl: 1 .  tiZANidine (ZANAFLEX) 4 MG tablet, Take 4 mg by mouth every 6 (six) hours as needed (for muscle spasms). , Disp: , Rfl:    No Known Allergies   Review of Systems  Constitutional: Negative.  Negative for fatigue.  Eyes: Negative for blurred vision and visual disturbance.  Respiratory: Negative.  Negative for shortness of breath.   Cardiovascular: Negative.  Negative for chest pain, palpitations and leg swelling.  Gastrointestinal: Negative.   Endocrine: Negative.  Negative for polydipsia, polyphagia and polyuria.  Musculoskeletal: Negative.   Skin: Negative.   Neurological: Negative for dizziness, weakness and headaches.  Psychiatric/Behavioral: Negative for confusion. The patient is not nervous/anxious.      Today's Vitals   04/12/19 1118  BP: 118/68  Pulse: 98  Temp: 97.7 F (36.5 C)  TempSrc: Oral  Weight: (!) 313 lb 12.8 oz (142.3 kg)  Height: 5' 5.4" (  1.661 m)  PainSc: 0-No pain   Body mass index is 51.58 kg/m.   Objective:  Physical Exam Vitals signs reviewed.  Constitutional:      Appearance: Normal appearance. She is well-developed. She is obese. She is not ill-appearing.  Cardiovascular:     Rate and Rhythm: Normal rate and regular rhythm.     Pulses: Normal pulses.     Heart sounds: Normal heart sounds. No murmur.  Pulmonary:     Effort: Pulmonary effort is normal.     Breath sounds: Normal breath sounds.  Musculoskeletal: Normal range of motion.     Right lower leg: Edema  (2+) present.     Left lower leg: Edema (2+) present.  Skin:    General: Skin is warm and dry.     Capillary Refill: Capillary refill takes less than 2 seconds.     Comments: Right upper back with soft fatty tissue present no pain    Neurological:     General: No focal deficit present.     Mental Status: She is alert and oriented to person, place, and time.  Psychiatric:        Mood and Affect: Mood normal.        Behavior: Behavior normal.        Thought Content: Thought content normal.        Judgment: Judgment normal.         Assessment And Plan:     1. Localized edema  Will provide PRN HCTZ due to the 2+ edema she has had long term.  - hydrochlorothiazide (HYDRODIURIL) 12.5 MG tablet; Take 1 tablet (12.5 mg total) by mouth daily as needed.  Dispense: 30 tablet; Refill: 2  2. Tobacco abuse  Discussed smoking cessation at this time not interested in quitting  I discussed the risk related to smoking and cardiac disease  3. Essential hypertension . B/P is well controlled.  . CMP ordered to check renal function.  . The importance of regular exercise and dietary modification was stressed to the patient.  . Stressed importance of losing ten percent of her body weight to help with B/P control.  . The weight loss would help with decreasing cardiac and cancer risk as well.   4. Mixed hyperlipidemia  Chronic, controlled  Continue with current medications  5. Prediabetes  Chronic, stable  Continue with current medications  Encouraged to limit intake of sugary foods and drinks  Encouraged to increase physical activity to 150 minutes per week as tolerated  6. Tobacco use disorder, continuous  Chronic, see #2  7. Morbid obesity (HCC)  Chronic  Discussed healthy diet and regular exercise options   Encouraged to exercise at least 150 minutes per week with 2 days of strength training as tolerated   Minette Brine, FNP    THE PATIENT IS ENCOURAGED TO PRACTICE  SOCIAL DISTANCING DUE TO THE COVID-19 PANDEMIC.

## 2019-04-25 ENCOUNTER — Other Ambulatory Visit: Payer: Self-pay

## 2019-04-25 ENCOUNTER — Ambulatory Visit (HOSPITAL_COMMUNITY)
Admission: EM | Admit: 2019-04-25 | Discharge: 2019-04-25 | Disposition: A | Discharge status: Home / Self Care | Payer: Medicare Other | Attending: Urgent Care | Admitting: Urgent Care

## 2019-04-25 ENCOUNTER — Encounter (HOSPITAL_COMMUNITY): Payer: Self-pay

## 2019-04-25 DIAGNOSIS — R52 Pain, unspecified: Secondary | ICD-10-CM | POA: Diagnosis not present

## 2019-04-25 DIAGNOSIS — G8929 Other chronic pain: Secondary | ICD-10-CM | POA: Diagnosis not present

## 2019-04-25 DIAGNOSIS — M069 Rheumatoid arthritis, unspecified: Secondary | ICD-10-CM

## 2019-04-25 DIAGNOSIS — M797 Fibromyalgia: Secondary | ICD-10-CM

## 2019-04-25 MED ORDER — PREDNISONE 20 MG PO TABS: ORAL_TABLET | ORAL | 0 refills | Status: DC

## 2019-04-25 NOTE — ED Triage Notes (Signed)
Pt states her fibromyalgia flare up. Pt states she need  some  prednisone one for the pain.

## 2019-04-25 NOTE — ED Provider Notes (Signed)
MRN: [id number] DOB: 1971/03/31  Subjective:   Shelby Robertson is a 48 y.o. female presenting for several day history of recurrent pain over multiple sites.  Patient has a history of fibromyalgia, rheumatoid arthritis and is very compliant with duloxetine, hydrocodone, tizanidine but states that these are not helping her.  Patient is working with her rheumatologist and gets regular follow-up.  States that she has done very well with her management plan, takes Humira and methotrexate regularly for her rheumatoid arthritis.  She has continued to try to reach out to their clinic but has been unsuccessful.  Patient would like to have a steroid course as this usually helps, is requesting a steroid injection which is usually what her rheumatologist does.  Last dosing of steroid was in April per patient.  Denies any falls, trauma, fever, redness, warmth.  No current facility-administered medications for this encounter.   Current Outpatient Medications:  .  amLODipine (NORVASC) 10 MG tablet, Take 1 tablet (10 mg total) by mouth daily., Disp: 90 tablet, Rfl: 1 .  diclofenac sodium (VOLTAREN) 1 % GEL, Apply 2 grams to 4 grams topically to affected area up to 4 times daily PRN, Disp: 4 Tube, Rfl: 2 .  DULoxetine (CYMBALTA) 60 MG capsule, Take 60 mg by mouth daily.  , Disp: , Rfl:  .  ezetimibe (ZETIA) 10 MG tablet, Take 1 tablet (10 mg total) by mouth daily., Disp: 90 tablet, Rfl: 1 .  folic acid (FOLVITE) 1 MG tablet, Take 2 tablets (2 mg total) by mouth daily., Disp: 180 tablet, Rfl: 1 .  HUMIRA PEN 40 MG/0.8ML PNKT, INJECT 1 PEN INTO THE SKIN EVERY 14 DAYS (Patient taking differently: Inject 40 mg into the muscle every 14 (fourteen) days. ), Disp: 6 each, Rfl: 0 .  hydrochlorothiazide (HYDRODIURIL) 12.5 MG tablet, Take 1 tablet (12.5 mg total) by mouth daily as needed., Disp: 30 tablet, Rfl: 2 .  metFORMIN (GLUCOPHAGE) 500 MG tablet, TAKE 1 TABLET BY MOUTH ONCE DAILY WITH  MORNING  AND  EVENING  MEALS  (Patient taking differently: Take 500 mg by mouth 2 (two) times daily with a meal. ), Disp: 180 tablet, Rfl: 1 .  methotrexate 50 MG/2ML injection, Inject 0.8 ml subcutaneously once weekly. (Patient taking differently: Inject 20 mg into the skin once a week. ), Disp: 10 mL, Rfl: 0 .  metoprolol tartrate (LOPRESSOR) 50 MG tablet, Take 1 tablet (50 mg total) by mouth 2 (two) times daily. Please schedule appointment for refills., Disp: 180 tablet, Rfl: 1 .  tiZANidine (ZANAFLEX) 4 MG tablet, Take 4 mg by mouth every 6 (six) hours as needed (for muscle spasms). , Disp: , Rfl:    No Known Allergies  Past Medical History:  Diagnosis Date  . Fibromyalgia   . Fibromyalgia   . Hypertension   . Osteoarthritis   . Rheumatoid arthritis(714.0)   . Rheumatoid arthritis(714.0)      History reviewed. No pertinent surgical history.  ROS  Objective:   Vitals: BP 133/88 (BP Location: Right Arm)   Pulse 89   Temp 98.8 F (37.1 C) (Oral)   Resp 18   Wt 298 lb (135.2 kg)   SpO2 98%   BMI 48.99 kg/m   Physical Exam Constitutional:      General: She is not in acute distress.    Appearance: Normal appearance. She is well-developed. She is not ill-appearing, toxic-appearing or diaphoretic.  HENT:     Head: Normocephalic and atraumatic.     Nose: Nose normal.  Mouth/Throat:     Mouth: Mucous membranes are moist.  Eyes:     Extraocular Movements: Extraocular movements intact.     Pupils: Pupils are equal, round, and reactive to light.  Cardiovascular:     Rate and Rhythm: Normal rate and regular rhythm.     Pulses: Normal pulses.     Heart sounds: Normal heart sounds. No murmur. No friction rub. No gallop.   Pulmonary:     Effort: Pulmonary effort is normal. No respiratory distress.     Breath sounds: Normal breath sounds. No stridor. No wheezing, rhonchi or rales.  Skin:    General: Skin is warm and dry.     Findings: No rash.       Neurological:     Mental Status: She is alert and  oriented to person, place, and time.  Psychiatric:        Mood and Affect: Mood normal.        Behavior: Behavior normal.        Thought Content: Thought content normal.       Assessment and Plan :   1. Chronic pain of multiple sites   2. Fibromyalgia   3. Rheumatoid arthritis flare ([place])     Patient is very compliant with all her medications which includes multiple pharmacologic classes.  I counseled on the nature of chronic use of steroids and was agreeable to doing an oral steroid course so that she can continue working with her rheumatologist and figure out a viable option.  Recommended continued efforts at trying to establish follow-up with her rheumatologist.  Counseled patient on potential for adverse effects with medications prescribed/recommended today, ER and return-to-clinic precautions discussed, patient verbalized understanding.    Jaynee Eagles, PA-C 04/25/19 2005

## 2019-04-27 NOTE — Progress Notes (Signed)
Office Visit Note  Patient: Shelby Robertson             Date of Birth: April 13, 1971           MRN: [id number]             PCP: Minette Brine, FNP Referring: Minette Brine, FNP Visit Date: 04/30/2019 Occupation: @GUAROCC @  Subjective:  Neck stiffness    History of Present Illness: Shelby Robertson is a 48 y.o. female with history of seronegative rheumatoid arthritis and fibromyalgia.  She is on Humira 40 mg subcutaneous injections every 14 days, methotrexate 0.8 mL subcutaneous injections once weekly, folic acid 2 mg by mouth daily.  She has not missed any doses of these medications recently.  She states that about 1 week ago she was evaluated in urgent care for left shoulder, neck pain, and right knee joint pain.  She states she was prescribed prednisone 40 mg for 5 days.  She reports that her joint pain and swelling has resolved since taking the prednisone.  She continues to have some neck pain and stiffness and trapezius muscle tension and muscle tenderness.  She does not have a heating pad but tried warm compresses.  She denies any other joint pain or joint swelling at this time.   Activities of Daily Living:  Patient reports joint stiffness lasting all day Patient Reports nocturnal pain.  Difficulty dressing/grooming: Denies Difficulty climbing stairs: Denies Difficulty getting out of chair: Denies Difficulty using hands for taps, buttons, cutlery, and/or writing: Denies  Review of Systems  Constitutional: Positive for fatigue.  HENT: Negative for mouth sores, mouth dryness and nose dryness.   Eyes: Negative for pain, visual disturbance and dryness.  Respiratory: Negative for cough, hemoptysis, shortness of breath and difficulty breathing.   Cardiovascular: Negative for chest pain, palpitations, hypertension and swelling in legs/feet.  Gastrointestinal: Negative for blood in stool, constipation and diarrhea.  Endocrine: Negative for increased urination.  Genitourinary: Negative  for painful urination.  Musculoskeletal: Positive for arthralgias, joint pain, myalgias, morning stiffness, muscle tenderness and myalgias. Negative for joint swelling and muscle weakness.  Skin: Negative for color change, pallor, rash, hair loss, nodules/bumps, skin tightness, ulcers and sensitivity to sunlight.  Allergic/Immunologic: Negative for susceptible to infections.  Neurological: Negative for dizziness, numbness, headaches and weakness.  Hematological: Negative for swollen glands.  Psychiatric/Behavioral: Positive for depressed mood and sleep disturbance. The patient is nervous/anxious.     PMFS History:  Patient Active Problem List   Diagnosis Date Noted  . Morbid obesity ([place]) 01/10/2019  . Prediabetes 01/10/2019  . Tobacco use disorder, continuous 01/10/2019  . Soft tissue swelling of back 01/10/2019  . Fibromyalgia 07/27/2017  . Other fatigue 07/27/2017  . History of hidradenitis suppurativa 07/27/2017  . History of hypertension 07/27/2017  . History of hyperlipidemia 07/27/2017  . Smoker 07/27/2017  . High risk medication use 08/21/2016  . Rheumatoid arthritis of multiple sites without rheumatoid factor ([place]) 08/12/2016  . ARTHRITIS, GENERALIZED 07/14/2010  . HYPERTENSION, BENIGN ESSENTIAL 03/04/2009  . POLYURIA 03/04/2009  . FIBROMYALGIA 11/09/2007  . HIDRADENITIS SUPPURATIVA 07/11/2007  . Mixed hyperlipidemia 06/28/2007  . LEG EDEMA, BILATERAL 05/05/2007    Past Medical History:  Diagnosis Date  . Fibromyalgia   . Fibromyalgia   . Hypertension   . Osteoarthritis   . Rheumatoid arthritis(714.0)   . Rheumatoid arthritis(714.0)     Family History  Problem Relation Age of Onset  . Diabetes Mother   . Healthy Father   . Heart attack Maternal  Grandmother   . Stroke Maternal Grandmother    History reviewed. No pertinent surgical history. Social History   Social History Narrative  . Not on file   Immunization History  Administered Date(s) Administered   . Influenza-Unspecified 07/18/2014  . Td 11/11/2008     Objective: Vital Signs: BP 127/79 (BP Location: Left Arm, Patient Position: Sitting, Cuff Size: Normal)   Pulse 84   Resp 16   Ht 5\' 6"  (1.676 m)   Wt (!) 313 lb (142 kg)   BMI 50.52 kg/m    Physical Exam Vitals signs and nursing note reviewed.  Constitutional:      Appearance: She is well-developed.  HENT:     Head: Normocephalic and atraumatic.  Eyes:     Conjunctiva/sclera: Conjunctivae normal.  Neck:     Musculoskeletal: Normal range of motion.  Cardiovascular:     Rate and Rhythm: Normal rate and regular rhythm.     Heart sounds: Normal heart sounds.  Pulmonary:     Effort: Pulmonary effort is normal.     Breath sounds: Normal breath sounds.  Abdominal:     General: Bowel sounds are normal.     Palpations: Abdomen is soft.  Lymphadenopathy:     Cervical: No cervical adenopathy.  Skin:    General: Skin is warm and dry.     Capillary Refill: Capillary refill takes less than 2 seconds.  Neurological:     Mental Status: She is alert and oriented to person, place, and time.  Psychiatric:        Behavior: Behavior normal.      Musculoskeletal Exam: C-spine limited ROM with lateral rotation to the left. Left trapezius muscle tension and tenderness.  Thoracic spine and lumbar spine good range of motion.  Shoulder joints, elbow joints, wrist joints, MCPs, PIPs, DIPs good range of motion no synovitis.  She is complete fist formation bilaterally.  Hip joints, knee joints, ankle joints, MCPs, PIPs, DIPs good range of motion no synovitis.  No warmth or effusion bilateral knee joints.  No tenderness or swelling of ankle joints.  She does have pedal edema bilaterally.  CDAI Exam: CDAI Score: 0.6  Patient Global: 3 mm; Provider Global: 3 mm Swollen: 0 ; Tender: 0  Joint Exam   No joint exam has been documented for this visit   There is currently no information documented on the homunculus. Go to the Rheumatology  activity and complete the homunculus joint exam.  Investigation: No additional findings.  Imaging: Xr Knee 3 View Left  Result Date: 04/30/2019 Severe medial and moderate lateral compartment narrowing was noted.  Medial and lateral osteophytes were noted.  No chondrocalcinosis was noted.  Moderate patellofemoral narrowing was noted. Impression: These findings are consistent with severe osteopenia arthritis and rheumatoid arthritis overlap and moderate chondromalacia patella.  Xr Knee 3 View Right  Result Date: 04/30/2019 Severe medial and lateral compartment narrowing with medial, lateral and intercondylar osteophytes was noted.  No chondrocalcinosis was noted.  Moderate patellofemoral narrowing was noted. Impression: These findings are consistent with severe osteoarthritis and rheumatoid arthritis overlap and moderate chondromalacia patella.   Recent Labs: Lab Results  Component Value Date   WBC 10.5 02/08/2019   HGB 13.7 02/08/2019   PLT 374 02/08/2019   NA 138 02/08/2019   K 3.9 02/08/2019   CL 106 02/08/2019   CO2 20 (L) 02/08/2019   GLUCOSE 130 (H) 02/08/2019   BUN 11 02/08/2019   CREATININE 0.81 02/08/2019   BILITOT 0.2 01/05/2019  ALKPHOS 93 07/20/2018   AST 10 01/05/2019   ALT 9 01/05/2019   PROT 7.1 01/05/2019   ALBUMIN 4.3 07/20/2018   CALCIUM 9.5 02/08/2019   GFRAA >60 02/08/2019   QFTBGOLDPLUS NEGATIVE 04/27/2018    Speciality Comments: No specialty comments available.  Procedures:  Trigger Point Inj  Date/Time: 04/30/2019 12:22 PM Performed by: Ofilia Neas, PA-C Authorized by: Ofilia Neas, PA-C   Consent Given by:  Patient Site marked: the procedure site was marked   Timeout: prior to procedure the correct patient, procedure, and site was verified   Indications:  Pain Total # of Trigger Points:  1 (Left trapezius trigger point injection) Location: neck   Needle Size:  27 G Approach:  Dorsal Medications #1:  0.5 mL lidocaine 1 %; 10 mg  triamcinolone acetonide 40 MG/ML Patient tolerance:  Patient tolerated the procedure well with no immediate complications   Allergies: Patient has no known allergies.   Assessment / Plan:     Visit Diagnoses: Rheumatoid arthritis of multiple sites without rheumatoid factor ([place]) -She has no synovitis on exam.  She reports she had a rheumatoid arthritis flare about 1 week ago, and she was evaluated at urgent care and started on prednisone 40 mg by mouth daily which she took for 5 days.  She has no inflammation at this time.  She was having right knee joint pain and inflammation which resolved with the prescription of prednisone.  We will proceed with applying for Visco gel injections for both knee joints.  She will continue on Humira 40 mg subcutaneous injections every 14 days, methotrexate 0.8 mL subcutaneous injections once weekly and folic acid 2 mg by mouth daily.  She does not need any refills at this time.  She was advised to notify us if she develops increased joint pain or joint swelling.  She will follow-up in the office in 5 months.  High risk medication use - Humira 40 mg sq once every 14 days, MTX 0.8 ml weekly, folic acid 2 mg daily.  With her next Humira refill the new formulation will be sent 10.  She was advised to hold methotrexate and Humira if she develops signs or symptoms of an infection and to resume once the infection is cleared.  We discussed importance of social distancing and following a standard precautions recommended by the CDC.  CBC, CMP, and TB gold were drawn today.  She will return for lab work in October and every 3 months to monitor for drug toxicity.- Plan: CBC with Differential/Platelet, COMPLETE METABOLIC PANEL WITH GFR, QuantiFERON-TB Gold Plus  Fibromyalgia -She has intermittent fibromyalgia flares.  She had a flare about 1 week ago and was evaluated urgent care.  She continues to have trapezius muscle tension and muscle tenderness on the left side.  She has neck  pain and stiffness.  She has limited range of motion with lateral rotation to the left.  She was given a handout of neck exercises to perform.  She continues to take Cymbalta 60 mg po daily and Zanaflex 4 mg at bedtime PRN for muscle spasms.   She is encouraged to stay active and exercise on a regular basis.  She has intermittent fatigue related to insomnia.  Good sleep hygiene was discussed.  Trapezius muscle spasm - She is having left trapezius muscle tension and tenderness.  She has limited ROM with lateral rotation to the left.  She was encouraged to perform stretching exercises and use a heating pad/warm compresses. She was  given a handout of neck exercises to perform. A left trapezius trigger point injection was performed today.  She tolerated the procedure. Procedure note completed above. After care discussed.   Chronic pain of both knees - She has intermittent pain in both knee joints.  She has no warmth or effusion on exam.  She had a left knee cortisone injection performed on 01/05/2019 which provided temporary pain relief.  She is having increased right knee joint pain about 1 week ago was evaluated at urgent care and was started on prednisone 40 mg by mouth for 5 days, which resolve the joint inflammation.  She would like to proceed with scheduling Visco gel injections for both knee joints.  Plan: XR KNEE 3 VIEW LEFT, XR KNEE 3 VIEW RIGHT.  The x-rays were consistent with severe osteoarthritis and rheumatoid arthritis overlap.  Bilateral moderate chondromalacia patella was noted.  This patient is diagnosed with osteoarthritis of the knee(s).   Radiographs show evidence of joint space narrowing, osteophytes, subchondral sclerosis and/or subchondral cysts.  This patient has knee pain which interferes with functional and activities of daily living.   This patient has experienced inadequate response, adverse effects and/or intolerance with conservative treatments such as acetaminophen, NSAIDS, topical  creams, physical therapy or regular exercise, knee bracing and/or weight loss.  This patient has experienced inadequate response or has a contraindication to intra articular steroid injections for at least 3 months.  This patient is not scheduled to have a total knee replacement within 6 months of starting treatment with viscosupplementation.  Other medical conditions are listed as follows:   Other fatigue: She has intermittent fatigue related to insomnia.    History of hypertension   History of hidradenitis suppurativa   History of hyperlipidemia    Orders: Orders Placed This Encounter  Procedures  . Trigger Point Inj  . XR KNEE 3 VIEW LEFT  . XR KNEE 3 VIEW RIGHT  . CBC with Differential/Platelet  . COMPLETE METABOLIC PANEL WITH GFR  . QuantiFERON-TB Gold Plus   Meds ordered this encounter  Medications  . Adalimumab (HUMIRA) 40 MG/0.4ML PSKT    Sig: Inject 40 mg into the skin every 14 (fourteen) days.    Dispense:  3 each    Refill:  0    Face-to-face time spent with patient was 30 minutes. Greater than 50% of time was spent in counseling and coordination of care.  Follow-Up Instructions: Return in about 5 months (around 09/30/2019) for Fibromyalgia, Rheumatoid arthritis.   Ofilia Neas, PA-C   I examined and evaluated the patient with Hazel Sams PA.  Patient reports recent flare which was treated with prednisone.  She does not have any synovitis today.  She has been having lot of pain and discomfort in her knee joints.  X-rays today showed severe rheumatoid arthritis and osteoarthritis overlap and moderate chondromalacia patella.  She will need bilateral total knee replacement in the future.  Weight loss diet and exercise was discussed.  The plan of care was discussed as noted above.  Bo Merino, MD  Note - This record has been created using Editor, commissioning.  Chart creation errors have been sought, but may not always  have been located. Such creation errors do  not reflect on  the standard of medical care.

## 2019-04-30 ENCOUNTER — Ambulatory Visit: Payer: Self-pay

## 2019-04-30 ENCOUNTER — Telehealth: Payer: Self-pay

## 2019-04-30 ENCOUNTER — Encounter: Payer: Self-pay | Admitting: Rheumatology

## 2019-04-30 ENCOUNTER — Ambulatory Visit (INDEPENDENT_AMBULATORY_CARE_PROVIDER_SITE_OTHER): Payer: Medicare Other | Admitting: Rheumatology

## 2019-04-30 ENCOUNTER — Other Ambulatory Visit: Payer: Self-pay

## 2019-04-30 VITALS — BP 127/79 | HR 84 | Resp 16 | Ht 66.0 in | Wt 313.0 lb

## 2019-04-30 DIAGNOSIS — Z8679 Personal history of other diseases of the circulatory system: Secondary | ICD-10-CM

## 2019-04-30 DIAGNOSIS — Z872 Personal history of diseases of the skin and subcutaneous tissue: Secondary | ICD-10-CM | POA: Diagnosis not present

## 2019-04-30 DIAGNOSIS — Z79899 Other long term (current) drug therapy: Secondary | ICD-10-CM | POA: Diagnosis not present

## 2019-04-30 DIAGNOSIS — M797 Fibromyalgia: Secondary | ICD-10-CM | POA: Diagnosis not present

## 2019-04-30 DIAGNOSIS — M0609 Rheumatoid arthritis without rheumatoid factor, multiple sites: Secondary | ICD-10-CM

## 2019-04-30 DIAGNOSIS — Z8639 Personal history of other endocrine, nutritional and metabolic disease: Secondary | ICD-10-CM

## 2019-04-30 DIAGNOSIS — M25562 Pain in left knee: Secondary | ICD-10-CM | POA: Diagnosis not present

## 2019-04-30 DIAGNOSIS — M25561 Pain in right knee: Secondary | ICD-10-CM

## 2019-04-30 DIAGNOSIS — M62838 Other muscle spasm: Secondary | ICD-10-CM

## 2019-04-30 DIAGNOSIS — G8929 Other chronic pain: Secondary | ICD-10-CM | POA: Diagnosis not present

## 2019-04-30 DIAGNOSIS — R5383 Other fatigue: Secondary | ICD-10-CM | POA: Diagnosis not present

## 2019-04-30 MED ORDER — TRIAMCINOLONE ACETONIDE 40 MG/ML IJ SUSP
10.0000 mg | INTRAMUSCULAR | Status: AC | PRN
  Administered 2019-04-30: 10 mg via INTRAMUSCULAR

## 2019-04-30 MED ORDER — HUMIRA (2 SYRINGE) 40 MG/0.4ML ~~LOC~~ PSKT: 40.0000 mg | PREFILLED_SYRINGE | SUBCUTANEOUS | 0 refills | Status: DC

## 2019-04-30 MED ORDER — LIDOCAINE HCL 1 % IJ SOLN
0.5000 mL | INTRAMUSCULAR | Status: AC | PRN
  Administered 2019-04-30: .5 mL

## 2019-04-30 NOTE — Patient Instructions (Signed)

## 2019-04-30 NOTE — Telephone Encounter (Signed)
Please apply for bilateral knee visco, per Taylor Dale, PA-C. Thanks!  

## 2019-05-01 NOTE — Progress Notes (Signed)
Absolute eosinophils are low.  WBC count is WNL.  We will continue to monitor.   Glucose is elevated-136.  Rest of CMP WNL.

## 2019-05-01 NOTE — Telephone Encounter (Signed)
Patient previously approved for Orthovisc, bilateral knee.  Weyman Pedro will call patient to schedule appointment.

## 2019-05-01 NOTE — Telephone Encounter (Signed)
LMOM for patient to call and schedule Orthovisc injections with Lovena Le

## 2019-05-02 LAB — COMPLETE METABOLIC PANEL WITH GFR
AG Ratio: 1.1 (calc) (ref 1.0–2.5)
ALT: 20 U/L (ref 6–29)
AST: 13 U/L (ref 10–35)
Albumin: 4.1 g/dL (ref 3.6–5.1)
Alkaline phosphatase (APISO): 67 U/L (ref 31–125)
BUN: 10 mg/dL (ref 7–25)
CO2: 25 mmol/L (ref 20–32)
Calcium: 9.5 mg/dL (ref 8.6–10.2)
Chloride: 104 mmol/L (ref 98–110)
Creat: 0.6 mg/dL (ref 0.50–1.10)
GFR, Est African American: 127 mL/min/{1.73_m2} (ref >=60)
GFR, Est Non African American: 109 mL/min/{1.73_m2} (ref >=60)
Globulin: 3.7 g/dL (calc) (ref 1.9–3.7)
Glucose, Bld: 136 mg/dL — ABNORMAL HIGH (ref 65–99)
Potassium: 4.3 mmol/L (ref 3.5–5.3)
Sodium: 136 mmol/L (ref 135–146)
Total Bilirubin: 0.3 mg/dL (ref 0.2–1.2)
Total Protein: 7.8 g/dL (ref 6.1–8.1)

## 2019-05-02 LAB — CBC WITH DIFFERENTIAL/PLATELET
Absolute Monocytes: 572 cells/uL (ref 200–950)
Basophils Absolute: 21 cells/uL (ref 0–200)
Basophils Relative: 0.2 %
Eosinophils Absolute: 10 cells/uL — ABNORMAL LOW (ref 15–500)
Eosinophils Relative: 0.1 %
HCT: 42.3 % (ref 35.0–45.0)
Hemoglobin: 14 g/dL (ref 11.7–15.5)
Lymphs Abs: 2496 cells/uL (ref 850–3900)
MCH: 29.3 pg (ref 27.0–33.0)
MCHC: 33.1 g/dL (ref 32.0–36.0)
MCV: 88.5 fL (ref 80.0–100.0)
MPV: 9.2 fL (ref 7.5–12.5)
Monocytes Relative: 5.5 %
Neutro Abs: 7301 cells/uL (ref 1500–7800)
Neutrophils Relative %: 70.2 %
Platelets: 381 10*3/uL (ref 140–400)
RBC: 4.78 10*6/uL (ref 3.80–5.10)
RDW: 13.5 % (ref 11.0–15.0)
Total Lymphocyte: 24 %
WBC: 10.4 10*3/uL (ref 3.8–10.8)

## 2019-05-02 LAB — QUANTIFERON-TB GOLD PLUS
Mitogen-NIL: 6.92 IU/mL
NIL: 0.01 IU/mL
QuantiFERON-TB Gold Plus: NEGATIVE
TB1-NIL: 0 IU/mL
TB2-NIL: 0 IU/mL

## 2019-05-02 NOTE — Progress Notes (Signed)
TB gold negative

## 2019-05-14 ENCOUNTER — Emergency Department (HOSPITAL_COMMUNITY)
Admission: EM | Admit: 2019-05-14 | Discharge: 2019-05-15 | Disposition: A | Discharge status: Home / Self Care | Payer: Medicare Other | Attending: Emergency Medicine | Admitting: Emergency Medicine

## 2019-05-14 ENCOUNTER — Other Ambulatory Visit: Payer: Self-pay

## 2019-05-14 ENCOUNTER — Encounter (HOSPITAL_COMMUNITY): Payer: Self-pay

## 2019-05-14 ENCOUNTER — Other Ambulatory Visit: Payer: Self-pay | Admitting: Physician Assistant

## 2019-05-14 ENCOUNTER — Other Ambulatory Visit: Payer: Self-pay | Admitting: Rheumatology

## 2019-05-14 DIAGNOSIS — M549 Dorsalgia, unspecified: Secondary | ICD-10-CM | POA: Insufficient documentation

## 2019-05-14 DIAGNOSIS — Z5321 Procedure and treatment not carried out due to patient leaving prior to being seen by health care provider: Secondary | ICD-10-CM | POA: Diagnosis not present

## 2019-05-14 LAB — URINALYSIS, ROUTINE W REFLEX MICROSCOPIC
Bilirubin Urine: NEGATIVE
Glucose, UA: NEGATIVE mg/dL
Ketones, ur: NEGATIVE mg/dL
Leukocytes,Ua: NEGATIVE
Nitrite: NEGATIVE
Protein, ur: NEGATIVE mg/dL
Specific Gravity, Urine: 1.012 (ref 1.005–1.030)
pH: 7 (ref 5.0–8.0)

## 2019-05-14 NOTE — ED Triage Notes (Signed)
Pt states she has lower back pain, worsening when she urinates. She also reports upper back pain feels like she is being stabbed. Hx of fibromyalgia and arthritis. She is ambulatory.

## 2019-05-14 NOTE — Telephone Encounter (Signed)
Last Visit: 04/30/19 Next Visit: 10/02/19 Labs: 04/30/19 absolute eosinophils are low. WBC count is WNL. Glucose is elevated-136. Rest of CMP WNL. TB Gold: 04/30/19 Neg    Okay to refill per Dr. Estanislado Pandy

## 2019-05-14 NOTE — Telephone Encounter (Signed)
Last Visit: 04/30/19 Next Visit: 10/02/19 Labs: 04/30/19 absolute eosinophils are low. WBC count is WNL. Glucose is elevated-136. Rest of CMP WNL.  Okay to refill per Dr. Estanislado Pandy

## 2019-05-15 DIAGNOSIS — M1712 Unilateral primary osteoarthritis, left knee: Secondary | ICD-10-CM | POA: Diagnosis not present

## 2019-05-15 DIAGNOSIS — M25571 Pain in right ankle and joints of right foot: Secondary | ICD-10-CM | POA: Diagnosis not present

## 2019-05-15 DIAGNOSIS — M25521 Pain in right elbow: Secondary | ICD-10-CM | POA: Diagnosis not present

## 2019-05-15 DIAGNOSIS — I10 Essential (primary) hypertension: Secondary | ICD-10-CM | POA: Diagnosis not present

## 2019-05-15 DIAGNOSIS — M79672 Pain in left foot: Secondary | ICD-10-CM | POA: Diagnosis not present

## 2019-05-15 DIAGNOSIS — M25512 Pain in left shoulder: Secondary | ICD-10-CM | POA: Diagnosis not present

## 2019-05-15 DIAGNOSIS — M545 Low back pain: Secondary | ICD-10-CM | POA: Diagnosis not present

## 2019-05-15 DIAGNOSIS — M79604 Pain in right leg: Secondary | ICD-10-CM | POA: Diagnosis not present

## 2019-05-15 DIAGNOSIS — M79671 Pain in right foot: Secondary | ICD-10-CM | POA: Diagnosis not present

## 2019-05-15 DIAGNOSIS — M25561 Pain in right knee: Secondary | ICD-10-CM | POA: Diagnosis not present

## 2019-05-15 DIAGNOSIS — M79605 Pain in left leg: Secondary | ICD-10-CM | POA: Diagnosis not present

## 2019-05-15 DIAGNOSIS — M542 Cervicalgia: Secondary | ICD-10-CM | POA: Diagnosis not present

## 2019-05-15 NOTE — ED Notes (Signed)
Patient called for room with no response from lobby.  ?

## 2019-05-15 NOTE — ED Notes (Signed)
Pt called for room x2 with no response, unable to locate patient in ED lobby

## 2019-05-18 ENCOUNTER — Ambulatory Visit (HOSPITAL_COMMUNITY)
Admission: EM | Admit: 2019-05-18 | Discharge: 2019-05-18 | Disposition: A | Discharge status: Home / Self Care | Payer: Medicare Other | Attending: Emergency Medicine | Admitting: Emergency Medicine

## 2019-05-18 ENCOUNTER — Encounter (HOSPITAL_COMMUNITY): Payer: Self-pay | Admitting: Emergency Medicine

## 2019-05-18 ENCOUNTER — Ambulatory Visit (INDEPENDENT_AMBULATORY_CARE_PROVIDER_SITE_OTHER): Payer: Medicare Other

## 2019-05-18 ENCOUNTER — Other Ambulatory Visit: Payer: Self-pay

## 2019-05-18 DIAGNOSIS — R9389 Abnormal findings on diagnostic imaging of other specified body structures: Secondary | ICD-10-CM

## 2019-05-18 DIAGNOSIS — J9811 Atelectasis: Secondary | ICD-10-CM | POA: Diagnosis not present

## 2019-05-18 DIAGNOSIS — R05 Cough: Secondary | ICD-10-CM | POA: Diagnosis not present

## 2019-05-18 DIAGNOSIS — M549 Dorsalgia, unspecified: Secondary | ICD-10-CM | POA: Diagnosis not present

## 2019-05-18 DIAGNOSIS — G8929 Other chronic pain: Secondary | ICD-10-CM | POA: Diagnosis not present

## 2019-05-18 DIAGNOSIS — R079 Chest pain, unspecified: Secondary | ICD-10-CM | POA: Diagnosis not present

## 2019-05-18 NOTE — ED Triage Notes (Addendum)
Pt presents to [facility] for assessment up upper back pain starting 1 week, has tried hydrocodone without relief.  Patient is currently in pain management.  Pt states "it's my lungs", but denies cough.  When patient has a spasm is the only time she c/o SOB.  NAD at triage.  Pt signals to her chest and c/o of a heaviness to this area.  Tachycardic at triage.  Will protocol an EKG

## 2019-05-18 NOTE — ED Provider Notes (Signed)
[place]    CSN: [id number] Arrival date & time: 05/18/19  0254     History   Chief Complaint Chief Complaint  Patient presents with  . Back Pain    HPI Shelby Robertson is a 48 y.o. female.   Patient presents with nonproductive cough, pain with deep breathing, and back pain this 1 week.  She states her pain is worse with cough and deep breathing.  She states there are no alleviating factors.  She has attempted treatment with hydrocodone without relief.  Patient has chronic back pain and is followed by the pain clinic.  She was seen here on 04/25/2019 and diagnosed with chronic pain of multiple sites, fibromyalgia, and rheumatoid arthritis flare.      The history is provided by the patient.    Past Medical History:  Diagnosis Date  . Fibromyalgia   . Fibromyalgia   . Hypertension   . Osteoarthritis   . Rheumatoid arthritis(714.0)   . Rheumatoid arthritis(714.0)     Patient Active Problem List   Diagnosis Date Noted  . Morbid obesity ([place]) 01/10/2019  . Prediabetes 01/10/2019  . Tobacco use disorder, continuous 01/10/2019  . Soft tissue swelling of back 01/10/2019  . Fibromyalgia 07/27/2017  . Other fatigue 07/27/2017  . History of hidradenitis suppurativa 07/27/2017  . History of hypertension 07/27/2017  . History of hyperlipidemia 07/27/2017  . Smoker 07/27/2017  . High risk medication use 08/21/2016  . Rheumatoid arthritis of multiple sites without rheumatoid factor ([place]) 08/12/2016  . ARTHRITIS, GENERALIZED 07/14/2010  . HYPERTENSION, BENIGN ESSENTIAL 03/04/2009  . POLYURIA 03/04/2009  . FIBROMYALGIA 11/09/2007  . HIDRADENITIS SUPPURATIVA 07/11/2007  . Mixed hyperlipidemia 06/28/2007  . LEG EDEMA, BILATERAL 05/05/2007    History reviewed. No pertinent surgical history.  OB History   No obstetric history on file.      Home Medications    Prior to Admission medications   Medication Sig Start Date End Date Taking? Authorizing Provider   hydrochlorothiazide (HYDRODIURIL) 12.5 MG tablet Take 1 tablet (12.5 mg total) by mouth daily as needed. 04/12/19  Yes Minette Brine, FNP  Adalimumab (HUMIRA) 40 MG/0.4ML PSKT Inject 40 mg into the skin every 14 (fourteen) days. 04/30/19   Ofilia Neas, PA-C  amLODipine (NORVASC) 10 MG tablet Take 1 tablet (10 mg total) by mouth daily. 01/10/19   Minette Brine, FNP  diclofenac sodium (VOLTAREN) 1 % GEL Apply 2 grams to 4 grams topically to affected area up to 4 times daily PRN 01/05/19   Ofilia Neas, PA-C  DULoxetine (CYMBALTA) 60 MG capsule Take 60 mg by mouth daily.      [provider]  ezetimibe (ZETIA) 10 MG tablet Take 1 tablet (10 mg total) by mouth daily. 01/10/19   Minette Brine, FNP  folic acid (FOLVITE) 1 MG tablet Take 2 tablets (2 mg total) by mouth daily. 01/05/19   Ofilia Neas, PA-C  HUMIRA PEN 40 MG/0.8ML PNKT INJECT 1 PEN INTO THE SKIN EVERY 14 DAYS 05/14/19   Bo Merino, MD  metFORMIN (GLUCOPHAGE) 500 MG tablet TAKE 1 TABLET BY MOUTH ONCE DAILY WITH  MORNING  AND  EVENING  MEALS Patient taking differently: Take 500 mg by mouth 2 (two) times daily with a meal.  02/08/19   Minette Brine, FNP  methotrexate 50 MG/2ML injection INJECT 0.8 ML SUBCUTANEOUSLY ONCE WEEKLY. 05/14/19   Bo Merino, MD  metoprolol tartrate (LOPRESSOR) 50 MG tablet Take 1 tablet (50 mg total) by mouth 2 (two) times  daily. Please schedule appointment for refills. 01/10/19   Minette Brine, FNP  tiZANidine (ZANAFLEX) 4 MG tablet Take 4 mg by mouth every 6 (six) hours as needed (for muscle spasms).     [provider]    Family History Family History  Problem Relation Age of Onset  . Diabetes Mother   . Healthy Father   . Heart attack Maternal Grandmother   . Stroke Maternal Grandmother     Social History Social History   Tobacco Use  . Smoking status: Current Every Day Smoker    Packs/day: 0.50    Years: 20.00    Pack years: 10.00    Types: Cigarettes  . Smokeless  tobacco: Never Used  Substance Use Topics  . Alcohol use: Not Currently  . Drug use: Not Currently     Allergies   Patient has no known allergies.   Review of Systems Review of Systems  Constitutional: Negative for chills and fever.  HENT: Negative for ear pain and sore throat.   Eyes: Negative for pain and visual disturbance.  Respiratory: Positive for cough. Negative for shortness of breath.   Cardiovascular: Positive for chest pain. Negative for palpitations.  Gastrointestinal: Negative for abdominal pain and vomiting.  Genitourinary: Negative for dysuria and hematuria.  Musculoskeletal: Positive for back pain. Negative for arthralgias.  Skin: Negative for color change and rash.  Neurological: Negative for seizures and syncope.  All other systems reviewed and are negative.    Physical Exam Triage Vital Signs ED Triage Vitals [05/18/19 1916]  Enc Vitals Group     BP (!) 131/91     Pulse Rate (!) 113     Resp 18     Temp 98.5 F (36.9 C)     Temp Source Oral     SpO2 100 %     Weight      Height      Head Circumference      Peak Flow      Pain Score 10     Pain Loc      Pain Edu?      Excl. in [place]?    No data found.  Updated Vital Signs BP (!) 131/91 (BP Location: Left Arm)   Pulse (!) 113   Temp 98.5 F (36.9 C) (Oral)   Resp 18   SpO2 100%   Visual Acuity Right Eye Distance:   Left Eye Distance:   Bilateral Distance:    Right Eye Near:   Left Eye Near:    Bilateral Near:     Physical Exam Vitals signs and nursing note reviewed.  Constitutional:      General: She is not in acute distress.    Appearance: She is well-developed.  HENT:     Head: Normocephalic and atraumatic.     Mouth/Throat:     Mouth: Mucous membranes are moist.     Pharynx: Oropharynx is clear. No posterior oropharyngeal erythema.  Eyes:     Conjunctiva/sclera: Conjunctivae normal.  Neck:     Musculoskeletal: Neck supple.  Cardiovascular:     Rate and Rhythm: Normal  rate and regular rhythm.     Heart sounds: Normal heart sounds.  Pulmonary:     Effort: Pulmonary effort is normal. No respiratory distress.     Breath sounds: Normal breath sounds.  Chest:     Chest wall: No tenderness.  Abdominal:     Palpations: Abdomen is soft.     Tenderness: There is no abdominal tenderness. There  is no guarding or rebound.  Musculoskeletal:        General: No tenderness or deformity.  Skin:    General: Skin is warm and dry.     Findings: No bruising, erythema or rash.  Neurological:     Mental Status: She is alert.      UC Treatments / Results  Labs (all labs ordered are listed, but only abnormal results are displayed) Labs Reviewed - No data to display  EKG   Radiology Dg Chest 2 View  Result Date: 05/18/2019 CLINICAL DATA:  Cough and chest pain EXAM: CHEST - 2 VIEW COMPARISON:  February 08, 2019 chest radiograph and chest CT October 19, 2018 FINDINGS: There is eventration of the right hemidiaphragm. There is slight bibasilar atelectasis. Lungs elsewhere are clear. Heart size and pulmonary vascularity are normal. No adenopathy. No bone lesions. No pneumothorax. 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 Mild bibasilar atelectasis. No edema or consolidation. Cardiac silhouette within normal limits. Electronically Signed   By: Lowella Grip III M.D.   On: 05/18/2019 19:48    Procedures Procedures (including critical care time)  Medications Ordered in UC Medications - No data to display  Initial Impression / Assessment and Plan / UC Course  I have reviewed the triage vital signs and the nursing notes.  Pertinent labs & imaging results that were available during my care of the patient were reviewed by me and considered in my medical decision making (see chart for details).   Atelectasis.  Abnormal chest x-ray.  Chest x-ray showed eventration of right hemidiaphragm and mild bibasilar atelectasis.  Discussed  with patient the findings shown on her chest x-ray.  Instructed patient to follow-up with PCP and a general surgeon on Monday.  Strict instructions given to patient to go to the emergency department if she develops shortness of breath or worsening pain.     Final Clinical Impressions(s) / UC Diagnoses   Final diagnoses:  Atelectasis  Abnormal chest x-ray     Discharge Instructions     Your chest x-ray showed an abnormality with your diaphragm.  You should follow-up with your primary care provider and with a general surgeon on Monday.    Go to the emergency room immediately if you develop difficulty breathing or acute worsening pain.        ED Prescriptions    None     Controlled Substance Prescriptions [place] Controlled Substance Registry consulted? Yes, I have consulted the [place] Controlled Substances Registry for this patient, and feel the risk/benefit ratio today is favorable for proceeding with this prescription for a controlled substance.   Sharion Balloon, NP 05/18/19 2015

## 2019-05-18 NOTE — Discharge Instructions (Addendum)
Your chest x-ray showed an abnormality with your diaphragm.  You should follow-up with your primary care provider and with a general surgeon on Monday.    Go to the emergency room immediately if you develop difficulty breathing or acute worsening pain.

## 2019-05-18 NOTE — ED Notes (Signed)
Patient able to ambulate independently  

## 2019-05-21 ENCOUNTER — Telehealth: Payer: Self-pay | Admitting: Pharmacy Technician

## 2019-05-21 NOTE — Telephone Encounter (Signed)
Received notification from pharmacy that patient's Medicare Part D is showing as inactive. And patient's Medicaid plan will not pay because it shows to bill Medicare 1st. Pharmacy eligibility check shows no active coverage. Called patient, and left message to call back. Need updated Med D info or patient will need to contact Medicaid if she no longer has Med D plan. Advised patient to contact office if she needs a sample.  9:43 AM Beatriz Chancellor, CPhT

## 2019-05-23 ENCOUNTER — Telehealth: Payer: Self-pay

## 2019-05-23 NOTE — Telephone Encounter (Signed)
Left message for patient to follow up on insurance coverage issues.  9:13 AM Shelby Robertson, CPhT

## 2019-05-28 ENCOUNTER — Other Ambulatory Visit: Payer: Self-pay

## 2019-05-28 ENCOUNTER — Ambulatory Visit (INDEPENDENT_AMBULATORY_CARE_PROVIDER_SITE_OTHER): Payer: Medicare Other | Admitting: Physician Assistant

## 2019-05-28 ENCOUNTER — Encounter (INDEPENDENT_AMBULATORY_CARE_PROVIDER_SITE_OTHER): Payer: Self-pay

## 2019-05-28 DIAGNOSIS — M170 Bilateral primary osteoarthritis of knee: Secondary | ICD-10-CM | POA: Diagnosis not present

## 2019-05-28 MED ORDER — HYALURONAN 30 MG/2ML IX SOSY
30.0000 mg | PREFILLED_SYRINGE | INTRA_ARTICULAR | Status: AC | PRN
  Administered 2019-05-28: 30 mg via INTRA_ARTICULAR

## 2019-05-28 MED ORDER — LIDOCAINE HCL 1 % IJ SOLN
1.5000 mL | INTRAMUSCULAR | Status: AC | PRN
  Administered 2019-05-28: 1.5 mL

## 2019-05-28 NOTE — Progress Notes (Signed)
   Procedure Note  Patient: Shelby Robertson             Date of Birth: 1971/06/09           MRN: [id number]             Visit Date: 05/28/2019  Procedures: Visit Diagnoses:  1. Primary osteoarthritis of both knees   Orthovisc #1 bilateral knee joint injections B/B  Large Joint Inj: bilateral knee on 05/28/2019 8:32 AM Indications: pain Details: 25 G 1.5 in needle, medial approach  Arthrogram: No  Medications (Right): 30 mg Hyaluronan 30 MG/2ML; 1.5 mL lidocaine 1 % Aspirate (Right): 0 mL Medications (Left): 30 mg Hyaluronan 30 MG/2ML; 1.5 mL lidocaine 1 % Aspirate (Left): 0 mL Outcome: tolerated well, no immediate complications Procedure, treatment alternatives, risks and benefits explained, specific risks discussed. Consent was given by the patient. Immediately prior to procedure a time out was called to verify the correct patient, procedure, equipment, support staff and site/side marked as required. Patient was prepped and draped in the usual sterile fashion.     Patient tolerated the procedure well.  Hazel Sams, PA-C

## 2019-05-30 NOTE — Telephone Encounter (Signed)
Left message for patient to follow up on insurance issues and to see if we need to sign her up for Patient Assistance for Humira.  9:52 AM Beatriz Chancellor, CPhT

## 2019-06-04 ENCOUNTER — Other Ambulatory Visit: Payer: Self-pay

## 2019-06-04 ENCOUNTER — Ambulatory Visit (INDEPENDENT_AMBULATORY_CARE_PROVIDER_SITE_OTHER): Payer: Medicare Other | Admitting: Physician Assistant

## 2019-06-04 DIAGNOSIS — M170 Bilateral primary osteoarthritis of knee: Secondary | ICD-10-CM

## 2019-06-04 MED ORDER — LIDOCAINE HCL 1 % IJ SOLN
1.5000 mL | INTRAMUSCULAR | Status: AC | PRN
  Administered 2019-06-04: 1.5 mL

## 2019-06-04 MED ORDER — HYALURONAN 30 MG/2ML IX SOSY
30.0000 mg | PREFILLED_SYRINGE | INTRA_ARTICULAR | Status: AC | PRN
  Administered 2019-06-04: 30 mg via INTRA_ARTICULAR

## 2019-06-04 NOTE — Progress Notes (Signed)
   Procedure Note  Patient: Shelby Robertson             Date of Birth: [birth year]           MRN: [id number]             Visit Date: 06/04/2019  Procedures: Visit Diagnoses:  1. Primary osteoarthritis of both knees    Orthovisc #2 Bilateral knee joint injections B/B Large Joint Inj: bilateral knee on 06/04/2019 8:21 AM Indications: pain Details: 25 G 1.5 in needle, medial approach  Arthrogram: No  Medications (Right): 30 mg Hyaluronan 30 MG/2ML; 1.5 mL lidocaine 1 % Aspirate (Right): 0 mL Medications (Left): 30 mg Hyaluronan 30 MG/2ML; 1.5 mL lidocaine 1 % Aspirate (Left): 0 mL Outcome: tolerated well, no immediate complications Procedure, treatment alternatives, risks and benefits explained, specific risks discussed. Consent was given by the patient. Immediately prior to procedure a time out was called to verify the correct patient, procedure, equipment, support staff and site/side marked as required. Patient was prepped and draped in the usual sterile fashion.      Patient tolerated the procedure well. Hazel Sams, PA-C

## 2019-06-07 ENCOUNTER — Ambulatory Visit: Payer: Self-pay | Admitting: Rheumatology

## 2019-06-08 NOTE — Telephone Encounter (Signed)
Patient following up with social security office to check the status of her Medicare Part D. It is still showing as inactive. She will let us know the result. Please see if patient needs samples when she comes for her appointment on 06/11/19. She probably should also go ahead and sign a PAP form just in case.  3:30 PM Beatriz Chancellor, CPhT

## 2019-06-11 ENCOUNTER — Other Ambulatory Visit: Payer: Self-pay

## 2019-06-11 ENCOUNTER — Ambulatory Visit (INDEPENDENT_AMBULATORY_CARE_PROVIDER_SITE_OTHER): Payer: Medicare Other | Admitting: Physician Assistant

## 2019-06-11 ENCOUNTER — Encounter (INDEPENDENT_AMBULATORY_CARE_PROVIDER_SITE_OTHER): Payer: Self-pay

## 2019-06-11 DIAGNOSIS — M170 Bilateral primary osteoarthritis of knee: Secondary | ICD-10-CM

## 2019-06-11 NOTE — Progress Notes (Signed)
   Procedure Note  Patient: Shelby Robertson             Date of Birth: [birth year]           MRN: [id number]             Visit Date: 06/11/2019  Procedures: Visit Diagnoses:  1. Primary osteoarthritis of both knees    Orthovisc #3 Bilateral knee joint injections B/B Large Joint Inj: bilateral knee on 06/11/2019 12:01 PM Indications: pain Details: 25 G 1.5 in needle, medial approach  Arthrogram: No  Medications (Right): 1.5 mL lidocaine 1 %; 30 mg Hyaluronan 30 MG/2ML Aspirate (Right): 0 mL Medications (Left): 1.5 mL lidocaine 1 %; 30 mg Hyaluronan 30 MG/2ML Aspirate (Left): 0 mL Outcome: tolerated well, no immediate complications Procedure, treatment alternatives, risks and benefits explained, specific risks discussed. Consent was given by the patient. Immediately prior to procedure a time out was called to verify the correct patient, procedure, equipment, support staff and site/side marked as required. Patient was prepped and draped in the usual sterile fashion.     Lot: [id number] Expiration: 03/17/21  Patient tolerated the procedure well.  Hazel Sams, PA-C

## 2019-06-13 DIAGNOSIS — M545 Low back pain: Secondary | ICD-10-CM | POA: Diagnosis not present

## 2019-06-13 DIAGNOSIS — M1712 Unilateral primary osteoarthritis, left knee: Secondary | ICD-10-CM | POA: Diagnosis not present

## 2019-06-13 DIAGNOSIS — M25561 Pain in right knee: Secondary | ICD-10-CM | POA: Diagnosis not present

## 2019-06-13 DIAGNOSIS — M542 Cervicalgia: Secondary | ICD-10-CM | POA: Diagnosis not present

## 2019-06-13 DIAGNOSIS — G4733 Obstructive sleep apnea (adult) (pediatric): Secondary | ICD-10-CM | POA: Diagnosis not present

## 2019-06-13 DIAGNOSIS — M79604 Pain in right leg: Secondary | ICD-10-CM | POA: Diagnosis not present

## 2019-06-13 DIAGNOSIS — M25512 Pain in left shoulder: Secondary | ICD-10-CM | POA: Diagnosis not present

## 2019-06-13 DIAGNOSIS — M79605 Pain in left leg: Secondary | ICD-10-CM | POA: Diagnosis not present

## 2019-06-13 DIAGNOSIS — M79672 Pain in left foot: Secondary | ICD-10-CM | POA: Diagnosis not present

## 2019-06-13 DIAGNOSIS — G894 Chronic pain syndrome: Secondary | ICD-10-CM | POA: Diagnosis not present

## 2019-06-13 DIAGNOSIS — I10 Essential (primary) hypertension: Secondary | ICD-10-CM | POA: Diagnosis not present

## 2019-06-13 DIAGNOSIS — M79671 Pain in right foot: Secondary | ICD-10-CM | POA: Diagnosis not present

## 2019-06-18 MED FILL — METHOTREXATE 25 MG/ML VIAL: 50 | 84 days supply | Qty: 10 | Fill #0

## 2019-06-18 MED FILL — HUMIRA PEN 40 MG/0.8ML PNKT: 40 | 28 days supply | Qty: 2 | Fill #0

## 2019-06-26 ENCOUNTER — Telehealth: Payer: Self-pay

## 2019-07-13 ENCOUNTER — Other Ambulatory Visit: Payer: Self-pay | Admitting: Nurse Practitioner

## 2019-07-13 DIAGNOSIS — M25512 Pain in left shoulder: Secondary | ICD-10-CM | POA: Diagnosis not present

## 2019-07-13 DIAGNOSIS — M79671 Pain in right foot: Secondary | ICD-10-CM | POA: Diagnosis not present

## 2019-07-13 DIAGNOSIS — M25561 Pain in right knee: Secondary | ICD-10-CM | POA: Diagnosis not present

## 2019-07-13 DIAGNOSIS — M545 Low back pain: Secondary | ICD-10-CM | POA: Diagnosis not present

## 2019-07-13 DIAGNOSIS — M79605 Pain in left leg: Secondary | ICD-10-CM | POA: Diagnosis not present

## 2019-07-13 DIAGNOSIS — M79604 Pain in right leg: Secondary | ICD-10-CM | POA: Diagnosis not present

## 2019-07-13 DIAGNOSIS — M542 Cervicalgia: Secondary | ICD-10-CM | POA: Diagnosis not present

## 2019-07-13 DIAGNOSIS — M25521 Pain in right elbow: Secondary | ICD-10-CM | POA: Diagnosis not present

## 2019-07-13 DIAGNOSIS — M1712 Unilateral primary osteoarthritis, left knee: Secondary | ICD-10-CM | POA: Diagnosis not present

## 2019-07-13 DIAGNOSIS — G894 Chronic pain syndrome: Secondary | ICD-10-CM | POA: Diagnosis not present

## 2019-07-13 DIAGNOSIS — M25571 Pain in right ankle and joints of right foot: Secondary | ICD-10-CM | POA: Diagnosis not present

## 2019-07-13 DIAGNOSIS — M79672 Pain in left foot: Secondary | ICD-10-CM | POA: Diagnosis not present

## 2019-07-16 ENCOUNTER — Ambulatory Visit: Payer: Medicare Other | Admitting: Nurse Practitioner

## 2019-07-20 ENCOUNTER — Other Ambulatory Visit: Payer: Self-pay | Admitting: Rheumatology

## 2019-07-23 MED FILL — BD TB SYRINGE 27GX1/2: 27G X 1/2" | 84 days supply | Qty: 12 | Fill #0

## 2019-07-23 NOTE — Telephone Encounter (Signed)
Last Visit: 04/30/19 Next Visit: 10/02/19  Okay to refill per Dr. Estanislado Pandy

## 2019-07-24 MED FILL — HUMIRA PEN 40 MG/0.8ML PNKT: 40 | 28 days supply | Qty: 2 | Fill #1

## 2019-07-25 ENCOUNTER — Telehealth: Payer: Self-pay

## 2019-08-29 ENCOUNTER — Other Ambulatory Visit: Payer: Self-pay | Admitting: *Deleted

## 2019-08-29 DIAGNOSIS — Z79899 Other long term (current) drug therapy: Secondary | ICD-10-CM

## 2019-08-30 LAB — CBC WITH DIFFERENTIAL/PLATELET
Absolute Monocytes: 420 cells/uL (ref 200–950)
Basophils Absolute: 49 cells/uL (ref 0–200)
Basophils Relative: 0.7 %
Eosinophils Absolute: 126 cells/uL (ref 15–500)
Eosinophils Relative: 1.8 %
HCT: 41.2 % (ref 35.0–45.0)
Hemoglobin: 14 g/dL (ref 11.7–15.5)
Lymphs Abs: 2499 cells/uL (ref 850–3900)
MCH: 29.6 pg (ref 27.0–33.0)
MCHC: 34 g/dL (ref 32.0–36.0)
MCV: 87.1 fL (ref 80.0–100.0)
MPV: 9.8 fL (ref 7.5–12.5)
Monocytes Relative: 6 %
Neutro Abs: 3906 cells/uL (ref 1500–7800)
Neutrophils Relative %: 55.8 %
Platelets: 380 10*3/uL (ref 140–400)
RBC: 4.73 10*6/uL (ref 3.80–5.10)
RDW: 13.3 % (ref 11.0–15.0)
Total Lymphocyte: 35.7 %
WBC: 7 10*3/uL (ref 3.8–10.8)

## 2019-08-30 LAB — COMPLETE METABOLIC PANEL WITH GFR
AG Ratio: 1.1 (calc) (ref 1.0–2.5)
ALT: 15 U/L (ref 6–29)
AST: 14 U/L (ref 10–35)
Albumin: 4.1 g/dL (ref 3.6–5.1)
Alkaline phosphatase (APISO): 87 U/L (ref 31–125)
BUN/Creatinine Ratio: 12 (calc) (ref 6–22)
BUN: 6 mg/dL — ABNORMAL LOW (ref 7–25)
CO2: 27 mmol/L (ref 20–32)
Calcium: 9.2 mg/dL (ref 8.6–10.2)
Chloride: 102 mmol/L (ref 98–110)
Creat: 0.52 mg/dL (ref 0.50–1.10)
GFR, Est African American: 133 mL/min/{1.73_m2} (ref >=60)
GFR, Est Non African American: 115 mL/min/{1.73_m2} (ref >=60)
Globulin: 3.7 g/dL (calc) (ref 1.9–3.7)
Glucose, Bld: 105 mg/dL — ABNORMAL HIGH (ref 65–99)
Potassium: 4 mmol/L (ref 3.5–5.3)
Sodium: 137 mmol/L (ref 135–146)
Total Bilirubin: 0.2 mg/dL (ref 0.2–1.2)
Total Protein: 7.8 g/dL (ref 6.1–8.1)

## 2019-09-09 ENCOUNTER — Ambulatory Visit (HOSPITAL_COMMUNITY)
Admission: EM | Admit: 2019-09-09 | Discharge: 2019-09-09 | Disposition: A | Discharge status: Home / Self Care | Payer: Medicare Other | Attending: Family Medicine | Admitting: Family Medicine

## 2019-09-09 ENCOUNTER — Other Ambulatory Visit: Payer: Self-pay

## 2019-09-09 ENCOUNTER — Encounter (HOSPITAL_COMMUNITY): Payer: Self-pay

## 2019-09-09 DIAGNOSIS — R8290 Unspecified abnormal findings in urine: Secondary | ICD-10-CM | POA: Diagnosis not present

## 2019-09-09 DIAGNOSIS — L02416 Cutaneous abscess of left lower limb: Secondary | ICD-10-CM | POA: Insufficient documentation

## 2019-09-09 LAB — POCT URINALYSIS DIP (DEVICE)
Bilirubin Urine: NEGATIVE
Glucose, UA: NEGATIVE mg/dL
Ketones, ur: NEGATIVE mg/dL
Nitrite: NEGATIVE
Protein, ur: NEGATIVE mg/dL
Specific Gravity, Urine: 1.02 (ref 1.005–1.030)
Urobilinogen, UA: 0.2 mg/dL (ref 0.0–1.0)
pH: 5.5 (ref 5.0–8.0)

## 2019-09-09 MED ORDER — CEPHALEXIN 500 MG PO CAPS: 500.0000 mg | ORAL_CAPSULE | Freq: Four times a day (QID) | ORAL | 0 refills | Status: DC

## 2019-09-09 NOTE — Discharge Instructions (Signed)
Advised to take antibiotic as prescribed Complete antibiotic course Drink plenty of water ( half body weight in ounces) To return  if symptom get worse

## 2019-09-09 NOTE — ED Provider Notes (Signed)
[facility]    CSN: [id number] Arrival date & time: 09/09/19  1653      History   Chief Complaint Chief Complaint  Patient presents with  . Urinary Tract Infection  . Abscess    HPI Shelby Robertson is a 48 y.o. female.   The history is provided by the patient. No language interpreter was used.  Urinary Tract Infection Pain quality:  Sharp Pain severity:  Moderate Onset quality:  Sudden Duration:  3 days Timing:  Intermittent Progression:  Waxing and waning Chronicity:  New Recent urinary tract infections: yes   Relieved by:  Acetaminophen Worsened by:  Nothing Urinary symptoms: no discolored urine, no foul-smelling urine, no frequent urination and no bladder incontinence   Associated symptoms: no abdominal pain, no fever, no flank pain, no nausea, no vaginal discharge and no vomiting   Risk factors: recurrent urinary tract infections   Risk factors: no hx of pyelonephritis, no sexually transmitted infections and no single kidney   Abscess Abscess location:  Behind left leg. Size:  3cm X 3cm Abscess quality: draining   Abscess quality: not painful and no redness   Red streaking: yes   Duration:  4 days Progression:  Worsening Chronicity:  New Relieved by:  Nothing Worsened by:  Nothing Associated symptoms: no fatigue, no fever, no headaches, no nausea and no vomiting     Past Medical History:  Diagnosis Date  . Fibromyalgia   . Fibromyalgia   . Hypertension   . Osteoarthritis   . Rheumatoid arthritis(714.0)   . Rheumatoid arthritis(714.0)     Patient Active Problem List   Diagnosis Date Noted  . Morbid obesity ([place]) 01/10/2019  . Prediabetes 01/10/2019  . Tobacco use disorder, continuous 01/10/2019  . Soft tissue swelling of back 01/10/2019  . Fibromyalgia 07/27/2017  . Other fatigue 07/27/2017  . History of hidradenitis suppurativa 07/27/2017  . History of hypertension 07/27/2017  . History of hyperlipidemia 07/27/2017  . Smoker  07/27/2017  . High risk medication use 08/21/2016  . Rheumatoid arthritis of multiple sites without rheumatoid factor ([place]) 08/12/2016  . ARTHRITIS, GENERALIZED 07/14/2010  . HYPERTENSION, BENIGN ESSENTIAL 03/04/2009  . POLYURIA 03/04/2009  . FIBROMYALGIA 11/09/2007  . HIDRADENITIS SUPPURATIVA 07/11/2007  . Mixed hyperlipidemia 06/28/2007  . LEG EDEMA, BILATERAL 05/05/2007    History reviewed. No pertinent surgical history.  OB History   No obstetric history on file.      Home Medications    Prior to Admission medications   Medication Sig Start Date End Date Taking? Authorizing Provider  Adalimumab (HUMIRA) 40 MG/0.4ML PSKT Inject 40 mg into the skin every 14 (fourteen) days. 04/30/19   Ofilia Neas, PA-C  amLODipine (NORVASC) 10 MG tablet TAKE 1 TABLET BY MOUTH EVERY DAY 07/13/19   Minette Brine, FNP  B-D TB SYRINGE 1CC/27GX1/2" 27G X 1/2" 1 ML MISC USE 1 SYRINGE TO INJECT METHOTREXATE INTO THE SKIN ONCE A WEEK. 07/23/19   Bo Merino, MD  cephALEXin (KEFLEX) 500 MG capsule Take 1 capsule (500 mg total) by mouth 4 (four) times daily. 09/09/19   Aquarius Tremper, Darrelyn Hillock, FNP  diclofenac sodium (VOLTAREN) 1 % GEL Apply 2 grams to 4 grams topically to affected area up to 4 times daily PRN 01/05/19   Ofilia Neas, PA-C  DULoxetine (CYMBALTA) 60 MG capsule Take 60 mg by mouth daily.      [provider]  ezetimibe (ZETIA) 10 MG tablet TAKE 1 TABLET BY MOUTH EVERY DAY 07/13/19   Laurance Flatten,  [name], FNP  folic acid (FOLVITE) 1 MG tablet Take 2 tablets (2 mg total) by mouth daily. 01/05/19   Ofilia Neas, PA-C  HUMIRA PEN 40 MG/0.8ML PNKT INJECT 1 PEN INTO THE SKIN EVERY 14 DAYS 05/14/19   Bo Merino, MD  hydrochlorothiazide (HYDRODIURIL) 12.5 MG tablet Take 1 tablet (12.5 mg total) by mouth daily as needed. 04/12/19   Minette Brine, FNP  metFORMIN (GLUCOPHAGE) 500 MG tablet TAKE 1 TABLET EVERY DAY WITH MORNING AND EVENING MEALS 07/13/19   Minette Brine, FNP  methotrexate 50  MG/2ML injection INJECT 0.8 ML SUBCUTANEOUSLY ONCE WEEKLY. 05/14/19   Bo Merino, MD  metoprolol tartrate (LOPRESSOR) 50 MG tablet TAKE 1 TABLET (50 MG TOTAL) BY MOUTH 2 (TWO) TIMES DAILY. PLEASE SCHEDULE APPOINTMENT FOR REFILLS. 07/13/19   Minette Brine, FNP  tiZANidine (ZANAFLEX) 4 MG tablet Take 4 mg by mouth every 6 (six) hours as needed (for muscle spasms).     [provider]    Family History Family History  Problem Relation Age of Onset  . Diabetes Mother   . Healthy Father   . Heart attack Maternal Grandmother   . Stroke Maternal Grandmother     Social History Social History   Tobacco Use  . Smoking status: Current Every Day Smoker    Packs/day: 0.50    Years: 20.00    Pack years: 10.00    Types: Cigarettes  . Smokeless tobacco: Never Used  Substance Use Topics  . Alcohol use: Not Currently  . Drug use: Not Currently     Allergies   Patient has no known allergies.   Review of Systems Review of Systems  Constitutional: Negative for activity change, appetite change, chills, fatigue and fever.  HENT: Negative for sinus pressure and sore throat.   Gastrointestinal: Negative for abdominal pain, nausea and vomiting.  Genitourinary: Negative for flank pain, frequency, hematuria, urgency, vaginal bleeding, vaginal discharge and vaginal pain.  Neurological: Negative for headaches.     Physical Exam Triage Vital Signs ED Triage Vitals  Enc Vitals Group     BP 09/09/19 1738 (!) 155/93     Pulse Rate 09/09/19 1738 (!) 103     Resp 09/09/19 1738 18     Temp 09/09/19 1738 98.6 F (37 C)     Temp Source 09/09/19 1738 Oral     SpO2 09/09/19 1738 98 %     Weight --      Height --      Head Circumference --      Peak Flow --      Pain Score 09/09/19 1739 0     Pain Loc --      Pain Edu? --      Excl. in [place]? --    No data found.  Updated Vital Signs BP (!) 155/93 (BP Location: Right Arm)   Pulse (!) 103   Temp 98.6 F (37 C) (Oral)   Resp 18    SpO2 98%   Visual Acuity Right Eye Distance:   Left Eye Distance:   Bilateral Distance:    Right Eye Near:   Left Eye Near:    Bilateral Near:     Physical Exam Constitutional:      General: She is not in acute distress.    Appearance: Normal appearance. She is normal weight. She is not ill-appearing or toxic-appearing.  HENT:     Nose: Nose normal.     Mouth/Throat:     Mouth: Mucous membranes are moist.  Cardiovascular:     Rate and Rhythm: Normal rate and regular rhythm.     Pulses: Normal pulses.     Heart sounds: Normal heart sounds. No murmur.  Pulmonary:     Effort: Pulmonary effort is normal. No respiratory distress.     Breath sounds: Normal breath sounds. No wheezing.  Chest:     Chest wall: No tenderness.  Abdominal:     General: Abdomen is flat. Bowel sounds are normal.     Palpations: Abdomen is soft.     Tenderness: There is no right CVA tenderness or left CVA tenderness.     Hernia: No hernia is present.  Genitourinary:    General: Normal vulva.  Musculoskeletal:     Comments: Open abscess behind left knee and draining  Neurological:     Mental Status: She is alert and oriented to person, place, and time.      UC Treatments / Results  Labs (all labs ordered are listed, but only abnormal results are displayed) Labs Reviewed  POCT URINALYSIS DIP (DEVICE) - Abnormal; Notable for the following components:      Result Value   Hgb urine dipstick MODERATE (*)    Leukocytes,Ua SMALL (*)    All other components within normal limits  URINE CULTURE    EKG   Radiology No results found.  Procedures Procedures (including critical care time)  Medications Ordered in UC Medications - No data to display  Initial Impression / Assessment and Plan / UC Course  I have reviewed the triage vital signs and the nursing notes.  Pertinent labs & imaging results that were available during my care of the patient were reviewed by me and considered in my medical  decision making (see chart for details).    There is blood in urine and some WBC. Urine culture was ordered. Patient was treated with Keflex and will cover skin abscess and possible UTI. Will call if culture result is abnormal Final Clinical Impressions(s) / UC Diagnoses   Final diagnoses:  Abscess of leg, left  Urine abnormality     Discharge Instructions     Advised to take antibiotic as prescribed Complete antibiotic course Drink plenty of water ( half body weight in ounces) To return  if symptom get worse    ED Prescriptions    Medication Sig Dispense Auth. Provider   cephALEXin (KEFLEX) 500 MG capsule Take 1 capsule (500 mg total) by mouth 4 (four) times daily. 28 capsule Paticia Moster, Darrelyn Hillock, FNP     PDMP not reviewed this encounter.   Emerson Monte, [place] 09/09/19 1849

## 2019-09-09 NOTE — ED Triage Notes (Signed)
Pt present urinary frequency and abscess on the back of her left leg. Pt states the urinary symptoms started 3 days ago with some urgency and burning sensation after she urinate

## 2019-09-10 MED FILL — HUMIRA PEN 40 MG/0.8ML PNKT: 40 | 28 days supply | Qty: 2 | Fill #2

## 2019-09-11 LAB — URINE CULTURE

## 2019-09-16 ENCOUNTER — Inpatient Hospital Stay (HOSPITAL_COMMUNITY)
Admission: EM | Admit: 2019-09-16 | Discharge: 2019-09-19 | DRG: 871 | Disposition: A | Discharge status: Home / Self Care | Payer: Medicare Other | Attending: Internal Medicine | Admitting: Internal Medicine

## 2019-09-16 ENCOUNTER — Emergency Department (HOSPITAL_COMMUNITY): Payer: Medicare Other

## 2019-09-16 DIAGNOSIS — A419 Sepsis, unspecified organism: Secondary | ICD-10-CM | POA: Diagnosis not present

## 2019-09-16 DIAGNOSIS — Z79899 Other long term (current) drug therapy: Secondary | ICD-10-CM

## 2019-09-16 DIAGNOSIS — E119 Type 2 diabetes mellitus without complications: Secondary | ICD-10-CM | POA: Diagnosis present

## 2019-09-16 DIAGNOSIS — Z79891 Long term (current) use of opiate analgesic: Secondary | ICD-10-CM

## 2019-09-16 DIAGNOSIS — F17210 Nicotine dependence, cigarettes, uncomplicated: Secondary | ICD-10-CM | POA: Diagnosis present

## 2019-09-16 DIAGNOSIS — Z20828 Contact with and (suspected) exposure to other viral communicable diseases: Secondary | ICD-10-CM | POA: Diagnosis present

## 2019-09-16 DIAGNOSIS — R519 Headache, unspecified: Secondary | ICD-10-CM

## 2019-09-16 DIAGNOSIS — M797 Fibromyalgia: Secondary | ICD-10-CM | POA: Diagnosis present

## 2019-09-16 DIAGNOSIS — R509 Fever, unspecified: Secondary | ICD-10-CM | POA: Diagnosis not present

## 2019-09-16 DIAGNOSIS — M0609 Rheumatoid arthritis without rheumatoid factor, multiple sites: Secondary | ICD-10-CM | POA: Diagnosis present

## 2019-09-16 DIAGNOSIS — I10 Essential (primary) hypertension: Secondary | ICD-10-CM | POA: Diagnosis present

## 2019-09-16 DIAGNOSIS — Z7984 Long term (current) use of oral hypoglycemic drugs: Secondary | ICD-10-CM

## 2019-09-16 DIAGNOSIS — Z823 Family history of stroke: Secondary | ICD-10-CM

## 2019-09-16 DIAGNOSIS — Z8249 Family history of ischemic heart disease and other diseases of the circulatory system: Secondary | ICD-10-CM

## 2019-09-16 DIAGNOSIS — R4182 Altered mental status, unspecified: Secondary | ICD-10-CM

## 2019-09-16 DIAGNOSIS — G9341 Metabolic encephalopathy: Secondary | ICD-10-CM | POA: Diagnosis present

## 2019-09-16 DIAGNOSIS — Z833 Family history of diabetes mellitus: Secondary | ICD-10-CM

## 2019-09-16 DIAGNOSIS — E782 Mixed hyperlipidemia: Secondary | ICD-10-CM | POA: Diagnosis present

## 2019-09-16 DIAGNOSIS — N390 Urinary tract infection, site not specified: Secondary | ICD-10-CM | POA: Diagnosis present

## 2019-09-16 DIAGNOSIS — R410 Disorientation, unspecified: Secondary | ICD-10-CM

## 2019-09-16 DIAGNOSIS — Z6841 Body Mass Index (BMI) 40.0 and over, adult: Secondary | ICD-10-CM

## 2019-09-16 DIAGNOSIS — R6510 Systemic inflammatory response syndrome (SIRS) of non-infectious origin without acute organ dysfunction: Secondary | ICD-10-CM

## 2019-09-16 DIAGNOSIS — Z23 Encounter for immunization: Secondary | ICD-10-CM

## 2019-09-16 DIAGNOSIS — M1990 Unspecified osteoarthritis, unspecified site: Secondary | ICD-10-CM | POA: Diagnosis present

## 2019-09-16 DIAGNOSIS — E118 Type 2 diabetes mellitus with unspecified complications: Secondary | ICD-10-CM

## 2019-09-16 LAB — COMPREHENSIVE METABOLIC PANEL
ALT: 15 U/L (ref 0–44)
AST: 26 U/L (ref 15–41)
Albumin: 3.7 g/dL (ref 3.5–5.0)
Alkaline Phosphatase: 83 U/L (ref 38–126)
Anion gap: 10 (ref 5–15)
BUN: 7 mg/dL (ref 6–20)
CO2: 26 mmol/L (ref 22–32)
Calcium: 9.2 mg/dL (ref 8.9–10.3)
Chloride: 101 mmol/L (ref 98–111)
Creatinine, Ser: 0.61 mg/dL (ref 0.44–1.00)
GFR calc Af Amer: >60 mL/min (ref >60)
GFR calc non Af Amer: >60 mL/min (ref >60)
Glucose, Bld: 129 mg/dL — ABNORMAL HIGH (ref 70–99)
Potassium: 4.3 mmol/L (ref 3.5–5.1)
Sodium: 137 mmol/L (ref 135–145)
Total Bilirubin: 0.9 mg/dL (ref 0.3–1.2)
Total Protein: 8.3 g/dL — ABNORMAL HIGH (ref 6.5–8.1)

## 2019-09-16 LAB — CBC WITH DIFFERENTIAL/PLATELET
Abs Immature Granulocytes: 0.03 10*3/uL (ref 0.00–0.07)
Basophils Absolute: 0 10*3/uL (ref 0.0–0.1)
Basophils Relative: 0 %
Eosinophils Absolute: 0 10*3/uL (ref 0.0–0.5)
Eosinophils Relative: 0 %
HCT: 42.3 % (ref 36.0–46.0)
Hemoglobin: 14.6 g/dL (ref 12.0–15.0)
Immature Granulocytes: 0 %
Lymphocytes Relative: 19 %
Lymphs Abs: 2 10*3/uL (ref 0.7–4.0)
MCH: 29.3 pg (ref 26.0–34.0)
MCHC: 34.5 g/dL (ref 30.0–36.0)
MCV: 84.8 fL (ref 80.0–100.0)
Monocytes Absolute: 0.6 10*3/uL (ref 0.1–1.0)
Monocytes Relative: 6 %
Neutro Abs: 7.4 10*3/uL (ref 1.7–7.7)
Neutrophils Relative %: 75 %
Platelets: 347 10*3/uL (ref 150–400)
RBC: 4.99 MIL/uL (ref 3.87–5.11)
RDW: 14.2 % (ref 11.5–15.5)
WBC: 10.1 10*3/uL (ref 4.0–10.5)
nRBC: 0 % (ref 0.0–0.2)

## 2019-09-16 LAB — URINALYSIS, ROUTINE W REFLEX MICROSCOPIC
Bilirubin Urine: NEGATIVE
Glucose, UA: NEGATIVE mg/dL
Ketones, ur: NEGATIVE mg/dL
Nitrite: NEGATIVE
Protein, ur: 30 mg/dL — AB
RBC / HPF: >50 RBC/hpf — ABNORMAL HIGH (ref 0–5)
Specific Gravity, Urine: 1.009 (ref 1.005–1.030)
pH: 7 (ref 5.0–8.0)

## 2019-09-16 LAB — POCT I-STAT EG7
Acid-Base Excess: 3 mmol/L — ABNORMAL HIGH (ref 0.0–2.0)
Bicarbonate: 29.8 mmol/L — ABNORMAL HIGH (ref 20.0–28.0)
Calcium, Ion: 1.16 mmol/L (ref 1.15–1.40)
HCT: 44 % (ref 36.0–46.0)
Hemoglobin: 15 g/dL (ref 12.0–15.0)
O2 Saturation: 94 %
Potassium: 4.2 mmol/L (ref 3.5–5.1)
Sodium: 137 mmol/L (ref 135–145)
TCO2: 31 mmol/L (ref 22–32)
pCO2, Ven: 53.3 mmHg (ref 44.0–60.0)
pH, Ven: 7.355 (ref 7.250–7.430)
pO2, Ven: 74 mmHg — ABNORMAL HIGH (ref 32.0–45.0)

## 2019-09-16 LAB — I-STAT BETA HCG BLOOD, ED (MC, WL, AP ONLY): I-stat hCG, quantitative: <5 m[IU]/mL (ref <5)

## 2019-09-16 LAB — CBG MONITORING, ED: Glucose-Capillary: 119 mg/dL — ABNORMAL HIGH (ref 70–99)

## 2019-09-16 LAB — LACTIC ACID, PLASMA: Lactic Acid, Venous: 1 mmol/L (ref 0.5–1.9)

## 2019-09-16 MED ORDER — SODIUM CHLORIDE 0.9 % IV BOLUS
1000.0000 mL | Freq: Once | INTRAVENOUS | Status: AC
  Administered 2019-09-16: 1000 mL via INTRAVENOUS

## 2019-09-16 MED ORDER — DEXAMETHASONE SODIUM PHOSPHATE 4 MG/ML IJ SOLN
4.0000 mg | Freq: Once | INTRAMUSCULAR | Status: AC
  Administered 2019-09-16: 20:00:00 4 mg via INTRAVENOUS
  Filled 2019-09-16: qty 1

## 2019-09-16 MED ORDER — METOCLOPRAMIDE HCL 5 MG/ML IJ SOLN
10.0000 mg | Freq: Once | INTRAMUSCULAR | Status: AC
  Administered 2019-09-16: 10 mg via INTRAVENOUS
  Filled 2019-09-16: qty 2

## 2019-09-16 MED ORDER — DIPHENHYDRAMINE HCL 50 MG/ML IJ SOLN
25.0000 mg | Freq: Once | INTRAMUSCULAR | Status: AC
  Administered 2019-09-16: 20:00:00 25 mg via INTRAVENOUS
  Filled 2019-09-16: qty 1

## 2019-09-16 MED ORDER — SODIUM CHLORIDE 0.9 % IV BOLUS
500.0000 mL | Freq: Once | INTRAVENOUS | Status: AC
  Administered 2019-09-16: 500 mL via INTRAVENOUS

## 2019-09-16 MED ORDER — SODIUM CHLORIDE 0.9 % IV SOLN
2.0000 g | Freq: Once | INTRAVENOUS | Status: AC
  Administered 2019-09-16: 21:00:00 2 g via INTRAVENOUS
  Filled 2019-09-16: qty 20

## 2019-09-16 MED ORDER — LIDOCAINE HCL (PF) 1 % IJ SOLN
30.0000 mL | Freq: Once | INTRAMUSCULAR | Status: AC
  Administered 2019-09-16: 30 mL
  Filled 2019-09-16: qty 30

## 2019-09-16 NOTE — ED Notes (Signed)
Pharm will try to finish med rec.

## 2019-09-16 NOTE — ED Notes (Signed)
Pt went sound asleep and snoring while being triaged.

## 2019-09-16 NOTE — ED Triage Notes (Signed)
[facility] reports pt with HA x2 days and has run out of migraine meds. Pt has HTN and has been taking bp meds. Pt 10/10 with N, given 20 RAC cbg 142. 192/118 hr 112 RR24

## 2019-09-16 NOTE — ED Notes (Signed)
EDPs are ready for LP

## 2019-09-17 ENCOUNTER — Telehealth: Payer: Self-pay

## 2019-09-17 ENCOUNTER — Encounter (HOSPITAL_COMMUNITY): Payer: Self-pay | Admitting: Internal Medicine

## 2019-09-17 ENCOUNTER — Ambulatory Visit: Payer: Self-pay

## 2019-09-17 ENCOUNTER — Other Ambulatory Visit: Payer: Self-pay

## 2019-09-17 DIAGNOSIS — M1990 Unspecified osteoarthritis, unspecified site: Secondary | ICD-10-CM | POA: Diagnosis present

## 2019-09-17 DIAGNOSIS — Z79891 Long term (current) use of opiate analgesic: Secondary | ICD-10-CM | POA: Diagnosis not present

## 2019-09-17 DIAGNOSIS — E782 Mixed hyperlipidemia: Secondary | ICD-10-CM | POA: Diagnosis present

## 2019-09-17 DIAGNOSIS — Z823 Family history of stroke: Secondary | ICD-10-CM | POA: Diagnosis not present

## 2019-09-17 DIAGNOSIS — I10 Essential (primary) hypertension: Secondary | ICD-10-CM

## 2019-09-17 DIAGNOSIS — M0609 Rheumatoid arthritis without rheumatoid factor, multiple sites: Secondary | ICD-10-CM

## 2019-09-17 DIAGNOSIS — Z20828 Contact with and (suspected) exposure to other viral communicable diseases: Secondary | ICD-10-CM | POA: Diagnosis present

## 2019-09-17 DIAGNOSIS — R4182 Altered mental status, unspecified: Secondary | ICD-10-CM | POA: Diagnosis not present

## 2019-09-17 DIAGNOSIS — Z833 Family history of diabetes mellitus: Secondary | ICD-10-CM | POA: Diagnosis not present

## 2019-09-17 DIAGNOSIS — Z23 Encounter for immunization: Secondary | ICD-10-CM | POA: Diagnosis present

## 2019-09-17 DIAGNOSIS — M797 Fibromyalgia: Secondary | ICD-10-CM | POA: Diagnosis present

## 2019-09-17 DIAGNOSIS — F17210 Nicotine dependence, cigarettes, uncomplicated: Secondary | ICD-10-CM | POA: Diagnosis present

## 2019-09-17 DIAGNOSIS — Z6841 Body Mass Index (BMI) 40.0 and over, adult: Secondary | ICD-10-CM | POA: Diagnosis not present

## 2019-09-17 DIAGNOSIS — A419 Sepsis, unspecified organism: Secondary | ICD-10-CM | POA: Diagnosis present

## 2019-09-17 DIAGNOSIS — N390 Urinary tract infection, site not specified: Secondary | ICD-10-CM | POA: Diagnosis present

## 2019-09-17 DIAGNOSIS — Z7984 Long term (current) use of oral hypoglycemic drugs: Secondary | ICD-10-CM | POA: Diagnosis not present

## 2019-09-17 DIAGNOSIS — Z8249 Family history of ischemic heart disease and other diseases of the circulatory system: Secondary | ICD-10-CM | POA: Diagnosis not present

## 2019-09-17 DIAGNOSIS — E119 Type 2 diabetes mellitus without complications: Secondary | ICD-10-CM | POA: Diagnosis present

## 2019-09-17 DIAGNOSIS — R509 Fever, unspecified: Secondary | ICD-10-CM | POA: Diagnosis present

## 2019-09-17 DIAGNOSIS — Z79899 Other long term (current) drug therapy: Secondary | ICD-10-CM | POA: Diagnosis not present

## 2019-09-17 DIAGNOSIS — G9341 Metabolic encephalopathy: Secondary | ICD-10-CM | POA: Diagnosis present

## 2019-09-17 HISTORY — DX: Altered mental status, unspecified: R41.82

## 2019-09-17 LAB — COMPREHENSIVE METABOLIC PANEL
ALT: 13 U/L (ref 0–44)
AST: 15 U/L (ref 15–41)
Albumin: 3.5 g/dL (ref 3.5–5.0)
Alkaline Phosphatase: 77 U/L (ref 38–126)
Anion gap: 9 (ref 5–15)
BUN: 6 mg/dL (ref 6–20)
CO2: 26 mmol/L (ref 22–32)
Calcium: 9.3 mg/dL (ref 8.9–10.3)
Chloride: 104 mmol/L (ref 98–111)
Creatinine, Ser: 0.55 mg/dL (ref 0.44–1.00)
GFR calc Af Amer: >60 mL/min (ref >60)
GFR calc non Af Amer: >60 mL/min (ref >60)
Glucose, Bld: 127 mg/dL — ABNORMAL HIGH (ref 70–99)
Potassium: 3.7 mmol/L (ref 3.5–5.1)
Sodium: 139 mmol/L (ref 135–145)
Total Bilirubin: 0.7 mg/dL (ref 0.3–1.2)
Total Protein: 8.1 g/dL (ref 6.5–8.1)

## 2019-09-17 LAB — CBC
HCT: 40.3 % (ref 36.0–46.0)
Hemoglobin: 13.5 g/dL (ref 12.0–15.0)
MCH: 29.5 pg (ref 26.0–34.0)
MCHC: 33.5 g/dL (ref 30.0–36.0)
MCV: 88 fL (ref 80.0–100.0)
Platelets: 329 10*3/uL (ref 150–400)
RBC: 4.58 MIL/uL (ref 3.87–5.11)
RDW: 14.4 % (ref 11.5–15.5)
WBC: 8.8 10*3/uL (ref 4.0–10.5)
nRBC: 0 % (ref 0.0–0.2)

## 2019-09-17 LAB — CBG MONITORING, ED
Glucose-Capillary: 154 mg/dL — ABNORMAL HIGH (ref 70–99)
Glucose-Capillary: 161 mg/dL — ABNORMAL HIGH (ref 70–99)
Glucose-Capillary: 160 mg/dL — ABNORMAL HIGH (ref 70–99)

## 2019-09-17 LAB — D-DIMER, QUANTITATIVE: D-Dimer, Quant: 0.44 ug/mL-FEU (ref 0.00–0.50)

## 2019-09-17 LAB — CSF CELL COUNT WITH DIFFERENTIAL
RBC Count, CSF: 28 /mm3 — ABNORMAL HIGH
RBC Count, CSF: 2 /mm3 — ABNORMAL HIGH
Tube #: 1
Tube #: 4
WBC, CSF: 1 /mm3 (ref 0–5)
WBC, CSF: 2 /mm3 (ref 0–5)

## 2019-09-17 LAB — SARS CORONAVIRUS 2 (TAT 6-24 HRS): SARS Coronavirus 2: NEGATIVE

## 2019-09-17 LAB — HIV ANTIBODY (ROUTINE TESTING W REFLEX): HIV Screen 4th Generation wRfx: NONREACTIVE

## 2019-09-17 LAB — PROTIME-INR
INR: 1 (ref 0.8–1.2)
Prothrombin Time: 13.1 seconds (ref 11.4–15.2)

## 2019-09-17 LAB — TROPONIN I (HIGH SENSITIVITY): Troponin I (High Sensitivity): 2 ng/L (ref <18)

## 2019-09-17 LAB — GLUCOSE, CAPILLARY: Glucose-Capillary: 156 mg/dL — ABNORMAL HIGH (ref 70–99)

## 2019-09-17 LAB — PROTEIN, CSF: Total  Protein, CSF: 28 mg/dL (ref 15–45)

## 2019-09-17 LAB — GLUCOSE, CSF: Glucose, CSF: 88 mg/dL — ABNORMAL HIGH (ref 40–70)

## 2019-09-17 MED ORDER — ACETAMINOPHEN 650 MG RE SUPP: 650.0000 mg | Freq: Four times a day (QID) | RECTAL | Status: DC | PRN

## 2019-09-17 MED ORDER — INSULIN ASPART 100 UNIT/ML ~~LOC~~ SOLN: 0.0000 [IU] | Freq: Every day | SUBCUTANEOUS | Status: DC

## 2019-09-17 MED ORDER — TIZANIDINE HCL 4 MG PO TABS
4.0000 mg | ORAL_TABLET | Freq: Four times a day (QID) | ORAL | Status: DC
  Administered 2019-09-17 – 2019-09-19 (×9): 4 mg via ORAL
  Filled 2019-09-17 (×9): qty 1

## 2019-09-17 MED ORDER — ACETAMINOPHEN 325 MG PO TABS
650.0000 mg | ORAL_TABLET | Freq: Four times a day (QID) | ORAL | Status: DC | PRN
  Administered 2019-09-17 (×2): 650 mg via ORAL
  Filled 2019-09-17 (×2): qty 2

## 2019-09-17 MED ORDER — DICLOFENAC SODIUM 1 % TD GEL
2.0000 g | Freq: Four times a day (QID) | TRANSDERMAL | Status: DC | PRN
  Filled 2019-09-17: qty 100

## 2019-09-17 MED ORDER — ENOXAPARIN SODIUM 80 MG/0.8ML ~~LOC~~ SOLN
65.0000 mg | SUBCUTANEOUS | Status: DC
  Administered 2019-09-17 – 2019-09-18 (×2): 65 mg via SUBCUTANEOUS
  Filled 2019-09-17: qty 0.65
  Filled 2019-09-17: qty 0.8

## 2019-09-17 MED ORDER — FOLIC ACID 1 MG PO TABS
2.0000 mg | ORAL_TABLET | Freq: Every day | ORAL | Status: DC
  Administered 2019-09-17 – 2019-09-19 (×3): 2 mg via ORAL
  Filled 2019-09-17 (×3): qty 2

## 2019-09-17 MED ORDER — INSULIN ASPART 100 UNIT/ML ~~LOC~~ SOLN
0.0000 [IU] | Freq: Three times a day (TID) | SUBCUTANEOUS | Status: DC
  Administered 2019-09-17 (×2): 2 [IU] via SUBCUTANEOUS
  Administered 2019-09-18: 1 [IU] via SUBCUTANEOUS

## 2019-09-17 MED ORDER — DULOXETINE HCL 60 MG PO CPEP
60.0000 mg | ORAL_CAPSULE | Freq: Every day | ORAL | Status: DC
  Administered 2019-09-18 – 2019-09-19 (×2): 60 mg via ORAL
  Filled 2019-09-17 (×2): qty 1

## 2019-09-17 MED ORDER — SODIUM CHLORIDE 0.9 % IV SOLN
INTRAVENOUS | Status: AC
  Administered 2019-09-17: 06:00:00 via INTRAVENOUS

## 2019-09-17 MED ORDER — SODIUM CHLORIDE 0.9 % IV SOLN
2.0000 g | Freq: Three times a day (TID) | INTRAVENOUS | Status: DC
  Administered 2019-09-18 (×2): 2 g via INTRAVENOUS
  Filled 2019-09-17 (×2): qty 2

## 2019-09-17 MED ORDER — METOPROLOL TARTRATE 50 MG PO TABS
50.0000 mg | ORAL_TABLET | Freq: Two times a day (BID) | ORAL | Status: DC
  Administered 2019-09-17 – 2019-09-19 (×6): 50 mg via ORAL
  Filled 2019-09-17 (×3): qty 1
  Filled 2019-09-17 (×2): qty 2
  Filled 2019-09-17: qty 1

## 2019-09-17 MED ORDER — AMLODIPINE BESYLATE 10 MG PO TABS
10.0000 mg | ORAL_TABLET | Freq: Every day | ORAL | Status: DC
  Administered 2019-09-17 – 2019-09-19 (×3): 10 mg via ORAL
  Filled 2019-09-17 (×2): qty 1
  Filled 2019-09-17 (×2): qty 2

## 2019-09-17 MED ORDER — VANCOMYCIN HCL IN DEXTROSE 1-5 GM/200ML-% IV SOLN
1000.0000 mg | Freq: Three times a day (TID) | INTRAVENOUS | Status: DC
  Administered 2019-09-18: 1000 mg via INTRAVENOUS
  Filled 2019-09-17 (×2): qty 200

## 2019-09-17 MED ORDER — EZETIMIBE 10 MG PO TABS
10.0000 mg | ORAL_TABLET | Freq: Every day | ORAL | Status: DC
  Administered 2019-09-18 – 2019-09-19 (×2): 10 mg via ORAL
  Filled 2019-09-17 (×3): qty 1

## 2019-09-17 MED ORDER — INFLUENZA VAC SPLIT QUAD 0.5 ML IM SUSY
0.5000 mL | PREFILLED_SYRINGE | INTRAMUSCULAR | Status: AC
  Administered 2019-09-18: 0.5 mL via INTRAMUSCULAR
  Filled 2019-09-17: qty 0.5

## 2019-09-17 MED ORDER — EZETIMIBE 10 MG PO TABS: 10.0000 mg | ORAL_TABLET | Freq: Every day | ORAL | Status: DC

## 2019-09-17 NOTE — Progress Notes (Signed)
Pharmacy Antibiotic Note  Shelby Robertson is a 48 y.o. female admitted on 09/16/2019 with sepsis.    LP done earlier today Urine shows 80 K GNR  Plan: Cefepime 2 g q8h Vanc 1 g q8h Monitor renal fx cx vanc lvls prn  Height: 5\' 6"  (167.6 cm) Weight: 298 lb 4.8 oz (135.3 kg) IBW/kg (Calculated) : 59.3  Temp (24hrs), Avg:100.2 F (37.9 C), Min:98.4 F (36.9 C), Max:100.6 F (38.1 C)  Recent Labs  Lab 09/16/19 1840 09/16/19 2005 09/17/19 0638  WBC 10.1  --  8.8  CREATININE 0.61  --  0.55  LATICACIDVEN  --  1.0  --     Estimated Creatinine Clearance: 124.4 mL/min (by C-G formula based on SCr of 0.55 mg/dL).    No Known Allergies  Barth Kirks, PharmD, BCPS, BCCCP Clinical Pharmacist [phone]  Please check AMION for all [facility] numbers  09/17/2019 10:15 PM

## 2019-09-17 NOTE — ED Notes (Signed)
Help get patient cleaned up on the monitor patient is resting with family at bedside and call bell in reach

## 2019-09-17 NOTE — Progress Notes (Signed)
pMN admission for encephalopathy, sepsis presumed secondary to UTI . See H&P for full details. Still confused this AM, but vitals are improving. Continue abx. She is COVID negative. Will change her admit orders to reflect that. Otherwise, continue as per H&P.   General: 48 y.o. female resting in bed in NAD Eyes: PERRL, normal sclera Cardiovascular: tachy, +S1, S2, no m/g/r Respiratory: CTABL, no w/r/r GI: BS+, NDNT, no masses noted, no organomegaly noted MSK: No e/c/c Neuro: Alert following some commands  .Jonnie Finner, DO

## 2019-09-17 NOTE — ED Notes (Signed)
Pt placed on 2L O2 ; patients oxygen saturation would drop to mid 80s. No signs of respiratory distress or pain, pt is alert and oriented.

## 2019-09-17 NOTE — Chronic Care Management (AMB) (Signed)
  Chronic Care Management   Follow Up Note   09/17/2019 Name: Bernadene Merlo MRN: [id number] DOB: [birth year]  Referred by: Minette Brine, FNP Reason for referral : Chronic Care Management (CCM RNCM Telephone Follow up )   Shelby Robertson is a 49 y.o. year old female who is a primary care patient of Minette Brine, [place]. The CCM team was consulted for assistance with chronic disease management and care coordination needs.    Review of patient status, including review of consultants reports, relevant laboratory and other test results, and collaboration with appropriate care team members and the patient's provider was performed as part of comprehensive patient evaluation and provision of chronic care management services.    Upon review of chart in preparation to follow up with patient, noted Ms. Bratz was admitted to [facility] on 09/16/19 with dx: SIRS (systemic inflammatory response syndrome) with unknown origin of infection.    Plan:   The CCM team will continue to monitor IP status and will plan outreach to patient upon discharge.    Barb Merino, RN, BSN, CCM Care Management Coordinator [facility]  Direct Phone: [phone]

## 2019-09-17 NOTE — ED Notes (Signed)
Breakfast tray ordered 

## 2019-09-17 NOTE — ED Notes (Signed)
Admitting at bedside 

## 2019-09-17 NOTE — ED Notes (Signed)
Ordered diet tray 

## 2019-09-17 NOTE — Progress Notes (Addendum)
Magda Paganini, ED RN, said she would send patient's medications to the pharmacy. Pt no longer has her home meds at bedside.  Pt is alert, oriented, cooperative, tearful. Skin was assessed, pt complaining of mild headache.

## 2019-09-17 NOTE — H&P (Signed)
TRH H&P    Patient Demographics:    Shelby Robertson, is a 48 y.o. female  MRN: [id number]  DOB - 1971/10/10  Admit Date - 09/16/2019  Referring MD/NP/PA:    Outpatient Primary MD for the patient is Minette Brine, [place]  Patient coming from:  home  Chief complaint-  headache   HPI:    Shelby Robertson  is a 48 y.o. female,  w rheumatoid arthritis, fibromyalgia, hypertension, w recent abscess of the left distal lower ext, and uti, apparently presents with c/o headache yesterday.  ? Slight photophobia, pt is unable to explain more.  Pt denies cough, cp, palp, sob, n/v, abd pain, diarrhea, dysuria, hematuria, brbrp.  Pt denies neck stiffness.   In Ed,  T 100.6 P 106, R 24, Bp  182/102  98% onRA Wt 136.1kg  CT brain IMPRESSION: No acute intracranial abnormality. Findings consistent with mild chronic small vessel ischemia  CXR IMPRESSION: Limited low volume portable exam. Bilateral interstitial pulmonary opacities may represent edema in the appropriate clinical setting.  Wbc 10.1, Hgb 14.6, Plt 347 Na 137, K 4.3,  Bun 7, Creatinine 0.61 Ast 26,Alt 15 Lactic acid 1.0  covid-19 negative  Blood culture x2 Urinalysis wbc 6-10, rbc >50  CSF pending  Pt will be admitted for w/up of sepsis (fever, tachycardia, tachypnea), secondary to UTI,    Review of systems:    In addition to the HPI above, pt unable to give ROS due to AMS No Fever-chills, No Headache, No changes with Vision or hearing, No problems swallowing food or Liquids, No Chest pain, Cough or Shortness of Breath, No Abdominal pain, No Nausea or Vomiting, bowel movements are regular, No Blood in stool or Urine, No dysuria, No new skin rashes or bruises, No new joints pains-aches,  No new weakness, tingling, numbness in any extremity, No recent weight gain or loss, No polyuria, polydypsia or polyphagia, No significant Mental  Stressors.  All other systems reviewed and are negative.    Past History of the following :    Past Medical History:  Diagnosis Date  . Fibromyalgia   . Fibromyalgia   . Hypertension   . Osteoarthritis   . Rheumatoid arthritis(714.0)   . Rheumatoid arthritis(714.0)       History reviewed. No pertinent surgical history.    Social History:      Social History   Tobacco Use  . Smoking status: Current Every Day Smoker    Packs/day: 0.50    Years: 20.00    Pack years: 10.00    Types: Cigarettes  . Smokeless tobacco: Never Used  Substance Use Topics  . Alcohol use: Not Currently       Family History :     Family History  Problem Relation Age of Onset  . Diabetes Mother   . Healthy Father   . Heart attack Maternal Grandmother   . Stroke Maternal Grandmother        Home Medications:   Prior to Admission medications   Medication Sig Start Date End Date Taking? Authorizing Provider  HYDROcodone-acetaminophen (NORCO) 10-325 MG tablet Take 1 tablet by mouth every 6 (six) hours. 08/20/19  Yes [provider]  tiZANidine (ZANAFLEX) 4 MG capsule Take 4 mg by mouth every 6 (six) hours. 08/10/19  Yes [provider]  Adalimumab (HUMIRA) 40 MG/0.4ML PSKT Inject 40 mg into the skin every 14 (fourteen) days. 04/30/19   Ofilia Neas, PA-C  amLODipine (NORVASC) 10 MG tablet TAKE 1 TABLET BY MOUTH EVERY DAY 07/13/19   Minette Brine, FNP  B-D TB SYRINGE 1CC/27GX1/2" 27G X 1/2" 1 ML MISC USE 1 SYRINGE TO INJECT METHOTREXATE INTO THE SKIN ONCE A WEEK. 07/23/19   Bo Merino, MD  cephALEXin (KEFLEX) 500 MG capsule Take 1 capsule (500 mg total) by mouth 4 (four) times daily. 09/09/19   Avegno, Darrelyn Hillock, FNP  diclofenac sodium (VOLTAREN) 1 % GEL Apply 2 grams to 4 grams topically to affected area up to 4 times daily PRN 01/05/19   Ofilia Neas, PA-C  DULoxetine (CYMBALTA) 60 MG capsule Take 60 mg by mouth daily.      [provider]  ezetimibe  (ZETIA) 10 MG tablet TAKE 1 TABLET BY MOUTH EVERY DAY 07/13/19   Minette Brine, FNP  folic acid (FOLVITE) 1 MG tablet Take 2 tablets (2 mg total) by mouth daily. 01/05/19   Ofilia Neas, PA-C  HUMIRA PEN 40 MG/0.8ML PNKT INJECT 1 PEN INTO THE SKIN EVERY 14 DAYS 05/14/19   Bo Merino, MD  hydrochlorothiazide (HYDRODIURIL) 12.5 MG tablet Take 1 tablet (12.5 mg total) by mouth daily as needed. 04/12/19   Minette Brine, FNP  metFORMIN (GLUCOPHAGE) 500 MG tablet TAKE 1 TABLET EVERY DAY WITH MORNING AND EVENING MEALS 07/13/19   Minette Brine, FNP  methotrexate 50 MG/2ML injection INJECT 0.8 ML SUBCUTANEOUSLY ONCE WEEKLY. 05/14/19   Bo Merino, MD  metoprolol tartrate (LOPRESSOR) 50 MG tablet TAKE 1 TABLET (50 MG TOTAL) BY MOUTH 2 (TWO) TIMES DAILY. PLEASE SCHEDULE APPOINTMENT FOR REFILLS. 07/13/19   Minette Brine, FNP     Allergies:    No Known Allergies   Physical Exam:   Vitals  Blood pressure (!) 181/113, pulse (!) 113, temperature (!) 100.6 F (38.1 C), temperature source Rectal, resp. rate (!) 23, height 5' 6"  (1.676 m), weight 136.1 kg, SpO2 97 %.  1.  General: axoxo1  2. Psychiatric: somnolent  3. Neurologic: cn2-12 intact, reflexes 2+ symmetric, diffuse with no clonus, motor 5/5 in all 4 ext (moves to painful stimuli)  4. HEENMT:  Anicteric, pupils 1.33m symmetric, direct, consensual intact Neck: no jvd   5. Respiratory : CTAB  6. Cardiovascular : Tachy s1, s2, no m/g/r  7. Gastrointestinal:  Abd: soft, morbidly obese, nt, nd, +bs  8. Skin:  Ext: no c/c/e,  No rash, didn't see drainage from behind left distal lower ext, no fluctuance  9.Musculoskeletal:  Good ROM    Data Review:    CBC Recent Labs  Lab 09/16/19 1840 09/16/19 1903  WBC 10.1  --   HGB 14.6 15.0  HCT 42.3 44.0  PLT 347  --   MCV 84.8  --   MCH 29.3  --   MCHC 34.5  --   RDW 14.2  --   LYMPHSABS 2.0  --   MONOABS 0.6  --   EOSABS 0.0  --   BASOSABS 0.0  --     ------------------------------------------------------------------------------------------------------------------  Results for orders placed or performed during the hospital encounter of 09/16/19 (from the past 48 hour(s))  CBG monitoring,  ED     Status: Abnormal   Collection Time: 09/16/19  6:30 PM  Result Value Ref Range   Glucose-Capillary 119 (H) 70 - 99 mg/dL  CBC with Differential     Status: None   Collection Time: 09/16/19  6:40 PM  Result Value Ref Range   WBC 10.1 4.0 - 10.5 K/uL   RBC 4.99 3.87 - 5.11 MIL/uL   Hemoglobin 14.6 12.0 - 15.0 g/dL   HCT 42.3 36.0 - 46.0 %   MCV 84.8 80.0 - 100.0 fL   MCH 29.3 26.0 - 34.0 pg   MCHC 34.5 30.0 - 36.0 g/dL   RDW 14.2 11.5 - 15.5 %   Platelets 347 150 - 400 K/uL   nRBC 0.0 0.0 - 0.2 %   Neutrophils Relative % 75 %   Neutro Abs 7.4 1.7 - 7.7 K/uL   Lymphocytes Relative 19 %   Lymphs Abs 2.0 0.7 - 4.0 K/uL   Monocytes Relative 6 %   Monocytes Absolute 0.6 0.1 - 1.0 K/uL   Eosinophils Relative 0 %   Eosinophils Absolute 0.0 0.0 - 0.5 K/uL   Basophils Relative 0 %   Basophils Absolute 0.0 0.0 - 0.1 K/uL   Immature Granulocytes 0 %   Abs Immature Granulocytes 0.03 0.00 - 0.07 K/uL    Comment: Performed at [facility], [address], [place], [place] [address]  Comprehensive metabolic panel     Status: Abnormal   Collection Time: 09/16/19  6:40 PM  Result Value Ref Range   Sodium 137 135 - 145 mmol/L   Potassium 4.3 3.5 - 5.1 mmol/L    Comment: SLIGHT HEMOLYSIS   Chloride 101 98 - 111 mmol/L   CO2 26 22 - 32 mmol/L   Glucose, Bld 129 (H) 70 - 99 mg/dL   BUN 7 6 - 20 mg/dL   Creatinine, Ser 0.61 0.44 - 1.00 mg/dL   Calcium 9.2 8.9 - 10.3 mg/dL   Total Protein 8.3 (H) 6.5 - 8.1 g/dL   Albumin 3.7 3.5 - 5.0 g/dL   AST 26 15 - 41 U/L   ALT 15 0 - 44 U/L   Alkaline Phosphatase 83 38 - 126 U/L   Total Bilirubin 0.9 0.3 - 1.2 mg/dL   GFR calc non Af Amer >60 >60 mL/min   GFR calc Af Amer >60 >60 mL/min   Anion gap  10 5 - 15    Comment: Performed at [facility] [address], [place], [place] [address]  POC beta hCG blood, ED     Status: None   Collection Time: 09/16/19  7:01 PM  Result Value Ref Range   I-stat hCG, quantitative <5.0 <5 mIU/mL   Comment 3            Comment:   GEST. AGE      CONC.  (mIU/mL)   <=1 WEEK        5 - 50     2 WEEKS       50 - 500     3 WEEKS       100 - 10,000     4 WEEKS     1,000 - 30,000        FEMALE AND NON-PREGNANT FEMALE:     LESS THAN 5 mIU/mL   POCT I-Stat EG7     Status: Abnormal   Collection Time: 09/16/19  7:03 PM  Result Value Ref Range   pH, Ven 7.355  7.250 - 7.430   pCO2, Ven 53.3 44.0 - 60.0 mmHg   pO2, Ven 74.0 (H) 32.0 - 45.0 mmHg   Bicarbonate 29.8 (H) 20.0 - 28.0 mmol/L   TCO2 31 22 - 32 mmol/L   O2 Saturation 94.0 %   Acid-Base Excess 3.0 (H) 0.0 - 2.0 mmol/L   Sodium 137 135 - 145 mmol/L   Potassium 4.2 3.5 - 5.1 mmol/L   Calcium, Ion 1.16 1.15 - 1.40 mmol/L   HCT 44.0 36.0 - 46.0 %   Hemoglobin 15.0 12.0 - 15.0 g/dL   Patient temperature HIDE    Sample type VENOUS   Lactic acid, plasma     Status: None   Collection Time: 09/16/19  8:05 PM  Result Value Ref Range   Lactic Acid, Venous 1.0 0.5 - 1.9 mmol/L    Comment: Performed at [facility] [address] [place], [place] [address]  Urinalysis, Routine w reflex microscopic     Status: Abnormal   Collection Time: 09/16/19  9:32 PM  Result Value Ref Range   Color, Urine YELLOW YELLOW   APPearance CLEAR CLEAR   Specific Gravity, Urine 1.009 1.005 - 1.030   pH 7.0 5.0 - 8.0   Glucose, UA NEGATIVE NEGATIVE mg/dL   Hgb urine dipstick LARGE (A) NEGATIVE   Bilirubin Urine NEGATIVE NEGATIVE   Ketones, ur NEGATIVE NEGATIVE mg/dL   Protein, ur 30 (A) NEGATIVE mg/dL   Nitrite NEGATIVE NEGATIVE   Leukocytes,Ua TRACE (A) NEGATIVE   RBC / HPF >50 (H) 0 - 5 RBC/hpf   WBC, UA 6-10 0 - 5 WBC/hpf   Bacteria, UA RARE (A) NONE SEEN   Squamous Epithelial / LPF 0-5 0 - 5     Comment: Performed at [facility] [address], [place], [place] [address]  CSF culture     Status: None (Preliminary result)   Collection Time: 09/16/19 11:49 PM   Specimen: CSF; Cerebrospinal Fluid  Result Value Ref Range   Specimen Description CSF    Special Requests NONE    [place] Stain      NO WBC SEEN NO ORGANISMS SEEN CYTOSPIN SMEAR Performed at [facility], [place] [address], [place], [place] [address]    Culture PENDING    Report Status PENDING   Glucose, CSF     Status: Abnormal   Collection Time: 09/16/19 11:49 PM  Result Value Ref Range   Glucose, CSF 88 (H) 40 - 70 mg/dL    Comment: Performed at [facility] [address], [place], [place] [address]  Protein, CSF     Status: None   Collection Time: 09/16/19 11:49 PM  Result Value Ref Range   Total  Protein, CSF 28 15 - 45 mg/dL    Comment: Performed at [facility] [address], [place], [place] [address]    Chemistries  Recent Labs  Lab 09/16/19 1840 09/16/19 1903  NA 137 137  K 4.3 4.2  CL 101  --   CO2 26  --   GLUCOSE 129*  --   BUN 7  --   CREATININE 0.61  --   CALCIUM 9.2  --   AST 26  --   ALT 15  --   ALKPHOS 83  --   BILITOT 0.9  --    ------------------------------------------------------------------------------------------------------------------  ------------------------------------------------------------------------------------------------------------------ GFR: Estimated Creatinine Clearance: 124.8 mL/min (by C-G formula based on SCr of 0.61 mg/dL). Liver Function Tests: Recent Labs  Lab 09/16/19 1840  AST 26  ALT 15  ALKPHOS 83  BILITOT 0.9  PROT 8.3*  ALBUMIN 3.7   No results for input(s): LIPASE, AMYLASE in the last 168 hours. No results for input(s): AMMONIA in the last 168 hours. Coagulation Profile: No results for input(s): INR, PROTIME in the last 168 hours. Cardiac Enzymes: No results for input(s): CKTOTAL, CKMB, CKMBINDEX, TROPONINI in  the last 168 hours. BNP (last 3 results) No results for input(s): PROBNP in the last 8760 hours. HbA1C: No results for input(s): HGBA1C in the last 72 hours. CBG: Recent Labs  Lab 09/16/19 1830  GLUCAP 119*   Lipid Profile: No results for input(s): CHOL, HDL, LDLCALC, TRIG, CHOLHDL, LDLDIRECT in the last 72 hours. Thyroid Function Tests: No results for input(s): TSH, T4TOTAL, FREET4, T3FREE, THYROIDAB in the last 72 hours. Anemia Panel: No results for input(s): VITAMINB12, FOLATE, FERRITIN, TIBC, IRON, RETICCTPCT in the last 72 hours.  --------------------------------------------------------------------------------------------------------------- Urine analysis:    Component Value Date/Time   COLORURINE YELLOW 09/16/2019 2132   APPEARANCEUR CLEAR 09/16/2019 2132   LABSPEC 1.009 09/16/2019 2132   PHURINE 7.0 09/16/2019 2132   GLUCOSEU NEGATIVE 09/16/2019 2132   HGBUR LARGE (A) 09/16/2019 2132   HGBUR small 05/04/2010 [place] 09/16/2019 2132   [place] NEGATIVE 09/16/2019 2132   PROTEINUR 30 (A) 09/16/2019 2132   UROBILINOGEN 0.2 09/09/2019 1750   NITRITE NEGATIVE 09/16/2019 2132   LEUKOCYTESUR TRACE (A) 09/16/2019 2132      Imaging Results:    Ct Head Wo Contrast  Result Date: 09/16/2019 CLINICAL DATA:  Head trauma, headache for 2 days EXAM: CT HEAD WITHOUT CONTRAST TECHNIQUE: Contiguous axial images were obtained from the base of the skull through the vertex without intravenous contrast. COMPARISON:  September 06, 2016 FINDINGS: Brain: No evidence of acute territorial infarction, hemorrhage, hydrocephalus,extra-axial collection or mass lesion/mass effect. The ventricles and sulci are normal in appearance. Low-attenuation changes in the deep white matter consistent with small vessel ischemia. Vascular: No hyperdense vessel or unexpected calcification. Skull: The skull is intact. No fracture or focal lesion identified. Sinuses/Orbits: The visualized  paranasal sinuses and mastoid air cells are clear. The orbits and globes intact. Other: None IMPRESSION: No acute intracranial abnormality. Findings consistent with mild chronic small vessel ischemia Electronically Signed   By: Prudencio Pair M.D.   On: 09/16/2019 19:14   Dg Chest Port 1 View  Result Date: 09/16/2019 CLINICAL DATA:  Evaluate for sepsis. EXAM: PORTABLE CHEST 1 VIEW COMPARISON:  Chest radiograph 05/18/2019 FINDINGS: Monitoring leads overlie the patient. Low lung volumes. Stable cardiac and mediastinal contours. Interval development of bilateral interstitial pulmonary opacities. No pleural effusion or pneumothorax. IMPRESSION: Limited low volume portable exam. Bilateral interstitial pulmonary opacities may represent edema in the appropriate clinical setting. Electronically Signed   By: Lovey Newcomer M.D.   On: 09/16/2019 21:09    ekg st at 110, nl axis, nl int, no st-t changes c/w ischemia   Assessment & Plan:    Principal Problem:   Sepsis ([place]) Active Problems:   Mixed hyperlipidemia   HYPERTENSION, BENIGN ESSENTIAL   Rheumatoid arthritis of multiple sites without rheumatoid factor (HCC)   AMS (altered mental status)  Sepsis (fever, tachycardia, leukocytosis) ? Secondary to UTI Blood culture x2 Urine culture Check esr vanco iv, cefepime iv pharmacy to dose   Acute lower UTI Urine culture abx as above  AMS CSF results -> negative for meningitis Check b12, esr, ana, tsh, rpr Consider MRI brain if  not improving Monitor  Tachycardia Check trop  Check d dimer, if positive then please order CTA chest Check tsh as above Check cardiac echo  Hypertension Cont Amlodipine 43m po qday Cont Metoprolol 594mpo bid  Hyperlipidemia Cont Zetia 1055mo qday  Dm2 Hold Metformin (due to elevated lactic acid) fsbs ac and qhs, ISS  RA STOP Humira for now Hold Norco      DVT Prophylaxis-   Lovenox - SCDs   AM Labs Ordered, also please review Full Orders  Family  Communication: Admission, patients condition and plan of care including tests being ordered have been discussed with the patient  who indicate understanding and agree with the plan and Code Status.  Code Status:  FULL CODE per patient  Admission status:  Inpatient: Based on patients clinical presentation and evaluation of above clinical data, I have made determination that patient meets Inpatient criteria at this time.  Pt meets sepsis criteria and will require iv abx, pt has high risk of clinical deterioration.  Pt will require >2 nites stay  Time spent in minutes : 70   JamJani GravelD on 09/17/2019 at 2:09 AM

## 2019-09-17 NOTE — ED Notes (Signed)
Report given to Lori RN

## 2019-09-17 NOTE — ED Provider Notes (Addendum)
[facility] EMERGENCY DEPARTMENT Provider Note   CSN: [id number] Arrival date & time: 09/16/19  1806     History   Chief Complaint Chief Complaint  Patient presents with   Headache    HPI Shelby Robertson is a 48 y.o. female history of hypertension, fibromyalgia, migraine headaches per patient     HPI  Patient presents to ED lethargic with altered mental status with severe headache and subjective fever.  Patient states she has had severe, constant, throbbing, frontal headache for 2 days with associated photophobia and phonophobia.   Patient states that headache came on yesterday with associated fatigue and felt "hot "she went home and slept she is unsure for how long.  Patient states that she took some medication at home for the pain but is uncertain what she took.  Denies any ibuprofen or Tylenol use.  Is uncertain of what happened after this however she is aware that she is in the hospital.  Patient states she feels confused and tired.  States that she has a history of headaches that are typically frontal however she has never had this constellation of symptoms before and states this is worse headache of her life.   Per son: Patient return to house this morning.  Was walking and talking okay and answering questions appropriately when she first returned home. She laid down to sleep (at approximately 9am) for several hours she then woke up and was slurring words, confused and lethargic (approximately 5:30pm). Son is uncertain what medications she took.   Per EMR Patient recently seen 11/22 by PCP and given keflex per note to cover her abscess of the left posterior knee and to cover for her concern that she had a UTI--however urine culture was equivocal and likely contaminated and dipstick showed only small leukocytes.    History is unreliable due to patient being extremely lethargic.  Level 5 caveat due to altered mental status.   Past Medical History:    Diagnosis Date   Fibromyalgia    Fibromyalgia    Hypertension    Osteoarthritis    Rheumatoid arthritis(714.0)    Rheumatoid arthritis(714.0)     Patient Active Problem List   Diagnosis Date Noted   Sepsis ([place]) 09/17/2019   Morbid obesity ([place]) 01/10/2019   Prediabetes 01/10/2019   Tobacco use disorder, continuous 01/10/2019   Soft tissue swelling of back 01/10/2019   Fibromyalgia 07/27/2017   Other fatigue 07/27/2017   History of hidradenitis suppurativa 07/27/2017   History of hypertension 07/27/2017   History of hyperlipidemia 07/27/2017   Smoker 07/27/2017   High risk medication use 08/21/2016   Rheumatoid arthritis of multiple sites without rheumatoid factor ([place]) 08/12/2016   ARTHRITIS, GENERALIZED 07/14/2010   HYPERTENSION, BENIGN ESSENTIAL 03/04/2009   POLYURIA 03/04/2009   FIBROMYALGIA 11/09/2007   HIDRADENITIS SUPPURATIVA 07/11/2007   Mixed hyperlipidemia 06/28/2007   LEG EDEMA, BILATERAL 05/05/2007    No past surgical history on file.   OB History   No obstetric history on file.      Home Medications    Prior to Admission medications   Medication Sig Start Date End Date Taking? Authorizing Provider  HYDROcodone-acetaminophen (NORCO) 10-325 MG tablet Take 1 tablet by mouth every 6 (six) hours. 08/20/19  Yes [provider]  tiZANidine (ZANAFLEX) 4 MG capsule Take 4 mg by mouth every 6 (six) hours. 08/10/19  Yes [provider]  Adalimumab (HUMIRA) 40 MG/0.4ML PSKT Inject 40 mg into the skin every 14 (fourteen) days. 04/30/19  Ofilia Neas, PA-C  amLODipine (NORVASC) 10 MG tablet TAKE 1 TABLET BY MOUTH EVERY DAY 07/13/19   Minette Brine, FNP  B-D TB SYRINGE 1CC/27GX1/2" 27G X 1/2" 1 ML MISC USE 1 SYRINGE TO INJECT METHOTREXATE INTO THE SKIN ONCE A WEEK. 07/23/19   Bo Merino, MD  cephALEXin (KEFLEX) 500 MG capsule Take 1 capsule (500 mg total) by mouth 4 (four) times daily. 09/09/19   Avegno, Darrelyn Hillock,  FNP  diclofenac sodium (VOLTAREN) 1 % GEL Apply 2 grams to 4 grams topically to affected area up to 4 times daily PRN 01/05/19   Ofilia Neas, PA-C  DULoxetine (CYMBALTA) 60 MG capsule Take 60 mg by mouth daily.      [provider]  ezetimibe (ZETIA) 10 MG tablet TAKE 1 TABLET BY MOUTH EVERY DAY 07/13/19   Minette Brine, FNP  folic acid (FOLVITE) 1 MG tablet Take 2 tablets (2 mg total) by mouth daily. 01/05/19   Ofilia Neas, PA-C  HUMIRA PEN 40 MG/0.8ML PNKT INJECT 1 PEN INTO THE SKIN EVERY 14 DAYS 05/14/19   Bo Merino, MD  hydrochlorothiazide (HYDRODIURIL) 12.5 MG tablet Take 1 tablet (12.5 mg total) by mouth daily as needed. 04/12/19   Minette Brine, FNP  metFORMIN (GLUCOPHAGE) 500 MG tablet TAKE 1 TABLET EVERY DAY WITH MORNING AND EVENING MEALS 07/13/19   Minette Brine, FNP  methotrexate 50 MG/2ML injection INJECT 0.8 ML SUBCUTANEOUSLY ONCE WEEKLY. 05/14/19   Bo Merino, MD  metoprolol tartrate (LOPRESSOR) 50 MG tablet TAKE 1 TABLET (50 MG TOTAL) BY MOUTH 2 (TWO) TIMES DAILY. PLEASE SCHEDULE APPOINTMENT FOR REFILLS. 07/13/19   Minette Brine, FNP    Family History Family History  Problem Relation Age of Onset   Diabetes Mother    Healthy Father    Heart attack Maternal Grandmother    Stroke Maternal Grandmother     Social History Social History   Tobacco Use   Smoking status: Current Every Day Smoker    Packs/day: 0.50    Years: 20.00    Pack years: 10.00    Types: Cigarettes   Smokeless tobacco: Never Used  Substance Use Topics   Alcohol use: Not Currently   Drug use: Not Currently     Allergies   Patient has no known allergies.   Review of Systems Review of Systems  Unable to perform ROS: Mental status change     Physical Exam Updated Vital Signs BP (!) 181/113    Pulse (!) 113    Temp (!) 100.6 F (38.1 C) (Rectal)    Resp (!) 23    Ht 5\' 6"  (1.676 m)    Wt 136.1 kg    SpO2 97%    BMI 48.42 kg/m   Physical Exam Vitals signs and  nursing note reviewed.  Constitutional:      General: She is not in acute distress.    Appearance: She is ill-appearing.     Comments: Patient is extremely lethargic, required sternal rub to arouse.  Barely able to stay awake to answer questions. Warm to touch.  HENT:     Head: Normocephalic and atraumatic.     Nose: Nose normal.     Mouth/Throat:     Mouth: Mucous membranes are moist.  Eyes:     Extraocular Movements: Extraocular movements intact.     Pupils: Pupils are equal, round, and reactive to light.  Neck:     Musculoskeletal: Neck rigidity present.     Comments: Unable to touch chin  to chest Cardiovascular:     Rate and Rhythm: Regular rhythm. Tachycardia present.     Pulses: Normal pulses.     Heart sounds: Normal heart sounds.  Pulmonary:     Effort: Pulmonary effort is normal. No respiratory distress.     Breath sounds: No wheezing.  Abdominal:     General: There is no distension.     Palpations: Abdomen is soft. There is no mass.     Tenderness: There is no abdominal tenderness.  Musculoskeletal:     Right lower leg: No edema.     Left lower leg: No edema.  Skin:    General: Skin is warm and dry.     Capillary Refill: Capillary refill takes less than 2 seconds.     Comments: Well healing incision posterior left knee (recent I&D). No erythema or tenderness.  Neurological:     Mental Status: She is alert. Mental status is at baseline. She is disoriented.     Sensory: No sensory deficit.     Comments: Oriented to self and location not date or events.  Psychiatric:        Mood and Affect: Mood normal.        Behavior: Behavior normal.      ED Treatments / Results  Labs (all labs ordered are listed, but only abnormal results are displayed) Labs Reviewed  COMPREHENSIVE METABOLIC PANEL - Abnormal; Notable for the following components:      Result Value   Glucose, Bld 129 (*)    Total Protein 8.3 (*)    All other components within normal limits  URINALYSIS,  ROUTINE W REFLEX MICROSCOPIC - Abnormal; Notable for the following components:   Hgb urine dipstick LARGE (*)    Protein, ur 30 (*)    Leukocytes,Ua TRACE (*)    RBC / HPF >50 (*)    Bacteria, UA RARE (*)    All other components within normal limits  GLUCOSE, CSF - Abnormal; Notable for the following components:   Glucose, CSF 88 (*)    All other components within normal limits  CBG MONITORING, ED - Abnormal; Notable for the following components:   Glucose-Capillary 119 (*)    All other components within normal limits  POCT I-STAT EG7 - Abnormal; Notable for the following components:   pO2, Ven 74.0 (*)    Bicarbonate 29.8 (*)    Acid-Base Excess 3.0 (*)    All other components within normal limits  CSF CULTURE  CULTURE, BLOOD (ROUTINE X 2)  CULTURE, BLOOD (ROUTINE X 2)  URINE CULTURE  SARS CORONAVIRUS 2 (TAT 6-24 HRS)  CBC WITH DIFFERENTIAL/PLATELET  LACTIC ACID, PLASMA  PROTEIN, CSF  PROTIME-INR  CSF CELL COUNT WITH DIFFERENTIAL  CSF CELL COUNT WITH DIFFERENTIAL  HIV ANTIBODY (ROUTINE TESTING W REFLEX)  COMPREHENSIVE METABOLIC PANEL  CBC  I-STAT VENOUS BLOOD GAS, ED  I-STAT BETA HCG BLOOD, ED ([facility], [facility], [facility] ONLY)  POC SARS CORONAVIRUS 2 AG -  ED    EKG EKG Interpretation  Date/Time:  Sunday September 16 2019 18:22:02 EST Ventricular Rate:  108 PR Interval:    QRS Duration: 98 QT Interval:  350 QTC Calculation: 470 R Axis:   93 Text Interpretation: Sinus tachycardia Borderline right axis deviation No significant change since last tracing Confirmed by Blanchie Dessert [phone]) on 09/16/2019 7:45:18 PM   Radiology Ct Head Wo Contrast  Result Date: 09/16/2019 CLINICAL DATA:  Head trauma, headache for 2 days EXAM: CT HEAD WITHOUT CONTRAST TECHNIQUE: Contiguous axial  images were obtained from the base of the skull through the vertex without intravenous contrast. COMPARISON:  September 06, 2016 FINDINGS: Brain: No evidence of acute territorial infarction, hemorrhage,  hydrocephalus,extra-axial collection or mass lesion/mass effect. The ventricles and sulci are normal in appearance. Low-attenuation changes in the deep white matter consistent with small vessel ischemia. Vascular: No hyperdense vessel or unexpected calcification. Skull: The skull is intact. No fracture or focal lesion identified. Sinuses/Orbits: The visualized paranasal sinuses and mastoid air cells are clear. The orbits and globes intact. Other: None IMPRESSION: No acute intracranial abnormality. Findings consistent with mild chronic small vessel ischemia Electronically Signed   By: Prudencio Pair M.D.   On: 09/16/2019 19:14   Dg Chest Port 1 View  Result Date: 09/16/2019 CLINICAL DATA:  Evaluate for sepsis. EXAM: PORTABLE CHEST 1 VIEW COMPARISON:  Chest radiograph 05/18/2019 FINDINGS: Monitoring leads overlie the patient. Low lung volumes. Stable cardiac and mediastinal contours. Interval development of bilateral interstitial pulmonary opacities. No pleural effusion or pneumothorax. IMPRESSION: Limited low volume portable exam. Bilateral interstitial pulmonary opacities may represent edema in the appropriate clinical setting. Electronically Signed   By: Lovey Newcomer M.D.   On: 09/16/2019 21:09    Procedures Procedures (including critical care time)  CRITICAL CARE Performed by: Tedd Sias   Total critical care time: 31 minutes  Critical care time was exclusive of separately billable procedures and treating other patients.   Critical care was necessary to treat or prevent imminent or life-threatening deterioration.  Critical care was time spent personally by me on the following activities: development of treatment plan with patient and/or surrogate as well as nursing, discussions with consultants, evaluation of patient's response to treatment, examination of patient, obtaining history from patient or surrogate, ordering and performing treatments and interventions, ordering and review of  laboratory studies, ordering and review of radiographic studies, pulse oximetry and re-evaluation of patient's condition.  Patient required require reassessment during ED visit also required consultation with ED attending as well as discussion with internal medicine attending.  Patient also required multiple fluid boluses due to tachycardia and meets SIRS criteria.   Medications Ordered in ED Medications  enoxaparin (LOVENOX) injection 40 mg (has no administration in time range)  0.9 %  sodium chloride infusion (has no administration in time range)  acetaminophen (TYLENOL) tablet 650 mg (has no administration in time range)    Or  acetaminophen (TYLENOL) suppository 650 mg (has no administration in time range)  insulin aspart (novoLOG) injection 0-9 Units (has no administration in time range)  insulin aspart (novoLOG) injection 0-5 Units (has no administration in time range)  metoCLOPramide (REGLAN) injection 10 mg (10 mg Intravenous Given 09/16/19 2018)  sodium chloride 0.9 % bolus 500 mL (0 mLs Intravenous Stopped 09/16/19 2247)  diphenhydrAMINE (BENADRYL) injection 25 mg (25 mg Intravenous Given 09/16/19 2017)  dexamethasone (DECADRON) injection 4 mg (4 mg Intravenous Given 09/16/19 2020)  sodium chloride 0.9 % bolus 1,000 mL (1,000 mLs Intravenous New Bag/Given 09/16/19 2111)  cefTRIAXone (ROCEPHIN) 2 g in sodium chloride 0.9 % 100 mL IVPB (0 g Intravenous Stopped 09/16/19 2144)  lidocaine (PF) (XYLOCAINE) 1 % injection 30 mL (30 mLs Infiltration Given by Other 09/16/19 2338)     Initial Impression / Assessment and Plan / ED Course  I have reviewed the triage vital signs and the nursing notes.  Pertinent labs & imaging results that were available during my care of the patient were reviewed by me and considered in my medical decision making (  see chart for details).         Patient currently on Humira presenting with severe headache, fever, altered mental status, tachycardia.   Concern for meningitis as patient has neck stiffness and photophobia with altered mental status.   Sepsis labs ordered, Covid test, UA, CBC, CMP, lactate, blood cultures, UA, urine cultures, CSF/CSF cultures, EKG, chest x-ray, head CT  Patient is acutely altered with tachycardia, fever, elevated blood pressure elevated respiratory rate.  Patient meet SIRS criteria.  Unable to discern infectious source.   Patient with recent abscess drainage of left knee however this area does not appear infected is not tender to touch no surrounding erythema or purulence or fluctuance.  Urine does not appear infected with trace leukocytes and few bacteria.  Patient does not have a leukocytosis.  Chest x-ray does not appear to have a focal consolidation and patient has not been coughing.  CT head without acute finding.  CSF, blood, urine cultures pending.  Given 2 g Rocephin for empiric meningitis treatment.  Reglan, Benadryl, Decadron, 2 L IV fluids.  Patient states headache is much improved but continues to be altered and A/O x2.   Source of infection undetermined at this time. Doubt pulmonary, skin, CNS. Unlikely to be urinary as patient was treated for what was unlikely a UTI. Cultures will hopefully direct further management.   2 AM discussed patient case with Dr. Maudie Mercury who will accept care of patient at this time.  Final Clinical Impressions(s) / ED Diagnoses   Final diagnoses:  SIRS (systemic inflammatory response syndrome) (HCC)  Fever and chills  Frontal headache  Disorientation    ED Discharge Orders    None       Tedd Sias, [place] 09/17/19 0201    Tedd Sias, PA 09/17/19 0327    Blanchie Dessert, MD 09/18/19 2157

## 2019-09-18 DIAGNOSIS — N390 Urinary tract infection, site not specified: Secondary | ICD-10-CM

## 2019-09-18 LAB — CBC WITH DIFFERENTIAL/PLATELET
Abs Immature Granulocytes: 0.05 10*3/uL (ref 0.00–0.07)
Basophils Absolute: 0 10*3/uL (ref 0.0–0.1)
Basophils Relative: 0 %
Eosinophils Absolute: 0 10*3/uL (ref 0.0–0.5)
Eosinophils Relative: 0 %
HCT: 38.4 % (ref 36.0–46.0)
Hemoglobin: 12.9 g/dL (ref 12.0–15.0)
Immature Granulocytes: 1 %
Lymphocytes Relative: 35 %
Lymphs Abs: 3.5 10*3/uL (ref 0.7–4.0)
MCH: 28.9 pg (ref 26.0–34.0)
MCHC: 33.6 g/dL (ref 30.0–36.0)
MCV: 85.9 fL (ref 80.0–100.0)
Monocytes Absolute: 0.7 10*3/uL (ref 0.1–1.0)
Monocytes Relative: 7 %
Neutro Abs: 5.8 10*3/uL (ref 1.7–7.7)
Neutrophils Relative %: 57 %
Platelets: 313 10*3/uL (ref 150–400)
RBC: 4.47 MIL/uL (ref 3.87–5.11)
RDW: 14.3 % (ref 11.5–15.5)
WBC: 10.1 10*3/uL (ref 4.0–10.5)
nRBC: 0 % (ref 0.0–0.2)

## 2019-09-18 LAB — RENAL FUNCTION PANEL
Albumin: 3.4 g/dL — ABNORMAL LOW (ref 3.5–5.0)
Anion gap: 9 (ref 5–15)
BUN: 13 mg/dL (ref 6–20)
CO2: 26 mmol/L (ref 22–32)
Calcium: 9.2 mg/dL (ref 8.9–10.3)
Chloride: 104 mmol/L (ref 98–111)
Creatinine, Ser: 0.65 mg/dL (ref 0.44–1.00)
GFR calc Af Amer: >60 mL/min (ref >60)
GFR calc non Af Amer: >60 mL/min (ref >60)
Glucose, Bld: 125 mg/dL — ABNORMAL HIGH (ref 70–99)
Phosphorus: 2.7 mg/dL (ref 2.5–4.6)
Potassium: 3.8 mmol/L (ref 3.5–5.1)
Sodium: 139 mmol/L (ref 135–145)

## 2019-09-18 LAB — URINE CULTURE: Culture: 80000 — AB

## 2019-09-18 LAB — GLUCOSE, CAPILLARY
Glucose-Capillary: 121 mg/dL — ABNORMAL HIGH (ref 70–99)
Glucose-Capillary: 139 mg/dL — ABNORMAL HIGH (ref 70–99)
Glucose-Capillary: 115 mg/dL — ABNORMAL HIGH (ref 70–99)
Glucose-Capillary: 107 mg/dL — ABNORMAL HIGH (ref 70–99)

## 2019-09-18 LAB — MAGNESIUM: Magnesium: 2 mg/dL (ref 1.7–2.4)

## 2019-09-18 LAB — PATHOLOGIST SMEAR REVIEW

## 2019-09-18 LAB — HEMOGLOBIN A1C
Hgb A1c MFr Bld: 7 % — ABNORMAL HIGH (ref 4.8–5.6)
Mean Plasma Glucose: 154.2 mg/dL

## 2019-09-18 MED ORDER — SULFAMETHOXAZOLE-TRIMETHOPRIM 800-160 MG PO TABS
1.0000 | ORAL_TABLET | Freq: Two times a day (BID) | ORAL | Status: DC
  Administered 2019-09-18 – 2019-09-19 (×3): 1 via ORAL
  Filled 2019-09-18 (×4): qty 1

## 2019-09-18 NOTE — ED Provider Notes (Signed)
.  Lumbar Puncture  Date/Time: 09/18/2019 9:54 PM Performed by: Blanchie Dessert, MD Authorized by: Blanchie Dessert, MD   Consent:    Consent obtained:  Written   Consent given by:  Patient   Risks discussed:  Bleeding, infection, pain and headache   Alternatives discussed:  No treatment Pre-procedure details:    Procedure purpose:  Diagnostic   Preparation: Patient was prepped and draped in usual sterile fashion   Anesthesia (see MAR for exact dosages):    Anesthesia method:  Local infiltration   Local anesthetic:  Lidocaine 2% WITH epi Procedure details:    Lumbar space:  L4-L5 interspace   Patient position:  Sitting   Needle gauge:  22   Needle type:  Diamond point   Ultrasound guidance: no     Number of attempts:  1   Fluid appearance:  Blood-tinged then clearing   Tubes of fluid:  4   Total volume (ml):  4 Post-procedure:    Puncture site:  Adhesive bandage applied   Patient tolerance of procedure:  Tolerated well, no immediate complications      Blanchie Dessert, MD 09/18/19 2155

## 2019-09-18 NOTE — Progress Notes (Signed)
[name]  PROGRESS NOTE    Shelby Robertson  [id number] DOB: Aug 29, 1971 DOA: 09/16/2019 PCP: Minette Brine, FNP   Brief Narrative:   48 yo F presenting with AMS and sepsis secondary to UTI.  12/1: Mentation is improving today. She is more interactive. She is confused about what brought her here and what her situation is, but is appropriate interacting today.     Assessment & Plan:   Principal Problem:   Sepsis ([place]) Active Problems:   Mixed hyperlipidemia   HYPERTENSION, BENIGN ESSENTIAL   Rheumatoid arthritis of multiple sites without rheumatoid factor (HCC)   AMS (altered mental status)  Sepsis secondary to UTI AMS     - fever, tachycardia, tachypnea, mentation changes, urinary source     - Bld Cx NTD     - CSF results -> negative for meningitis     - COVID negative     - UCx Enterobacter; sensative to bactrim, cipro, ceftriaxone     - narrow vanc/cefepime to bactrim     - mentation improved today; active and engaged in care today  Tachycardia     - result of sepsis; see above  Hypertension     - continue amlodipine 10mg  po qday and metoprolol 50mg  BID  Hyperlipidemia     - continue zetia 10mg  po qday  DM2     - metformin held     - continue SSI     - A1c pending  RA     - humira and norco held  More with it today. UCx with enterobacter. Bld Cx neg x 2 days. Let's get a third day on that. Transition her to Bactrim. Look for d/c in AM  DVT prophylaxis: lovenox Code Status: FULL   Disposition Plan: TBD  Antimicrobials:   Bactrim   ROS:  Denies CP, N, V, dyspnea, ab pain. Remainder 10-pt ROS is negative for all not previously mentioned.  Subjective: "I don't know what is going on."  Objective: Vitals:   09/17/19 1800 09/17/19 1900 09/17/19 1946 09/18/19 0427  BP: (!) 146/99 (!) 144/88 (!) 144/95 129/89  Pulse: 68 66 74 76  Resp: 19  20 18   Temp:   98.4 F (36.9 C) 98.2 F (36.8 C)  TempSrc:    Oral  SpO2: 99% 100% 95% 95%  Weight:   135.3  kg 135.3 kg  Height:   5\' 6"  (1.676 m)     Intake/Output Summary (Last 24 hours) at 09/18/2019 1203 Last data filed at 09/18/2019 1100 Gross per 24 hour  Intake 1280 ml  Output 1050 ml  Net 230 ml   Filed Weights   09/16/19 2120 09/17/19 1946 09/18/19 0427  Weight: 136.1 kg 135.3 kg 135.3 kg    Examination:  General: 48 y.o. female resting in bed in NAD Cardiovascular: RRR, +S1, S2, no m/g/r Respiratory: CTABL, no w/r/r GI: BS+, NDNT, no masses noted, no organomegaly noted MSK: No e/c/c Neuro: A&O x 3, no focal deficits Psyc: Appropriate interaction and affect, calm/cooperative   Data Reviewed: I have personally reviewed following labs and imaging studies.  CBC: Recent Labs  Lab 09/16/19 1840 09/16/19 1903 09/17/19 0638 09/18/19 0033  WBC 10.1  --  8.8 10.1  NEUTROABS 7.4  --   --  5.8  HGB 14.6 15.0 13.5 12.9  HCT 42.3 44.0 40.3 38.4  MCV 84.8  --  88.0 85.9  PLT 347  --  329 [id number]   Basic Metabolic Panel: Recent Labs  Lab 09/16/19 1840 09/16/19  1903 09/17/19 0638 09/18/19 0033  NA 137 137 139 139  K 4.3 4.2 3.7 3.8  CL 101  --  104 104  CO2 26  --  26 26  GLUCOSE 129*  --  127* 125*  BUN 7  --  6 13  CREATININE 0.61  --  0.55 0.65  CALCIUM 9.2  --  9.3 9.2  MG  --   --   --  2.0  PHOS  --   --   --  2.7   GFR: Estimated Creatinine Clearance: 124.4 mL/min (by C-G formula based on SCr of 0.65 mg/dL). Liver Function Tests: Recent Labs  Lab 09/16/19 1840 09/17/19 0638 09/18/19 0033  AST 26 15  --   ALT 15 13  --   ALKPHOS 83 77  --   BILITOT 0.9 0.7  --   PROT 8.3* 8.1  --   ALBUMIN 3.7 3.5 3.4*   No results for input(s): LIPASE, AMYLASE in the last 168 hours. No results for input(s): AMMONIA in the last 168 hours. Coagulation Profile: Recent Labs  Lab 09/17/19 0638  INR 1.0   Cardiac Enzymes: No results for input(s): CKTOTAL, CKMB, CKMBINDEX, TROPONINI in the last 168 hours. BNP (last 3 results) No results for input(s): PROBNP in the  last 8760 hours. HbA1C: No results for input(s): HGBA1C in the last 72 hours. CBG: Recent Labs  Lab 09/17/19 0343 09/17/19 0733 09/17/19 1523 09/17/19 2107 09/18/19 0806  GLUCAP 154* 161* 160* 156* 121*   Lipid Profile: No results for input(s): CHOL, HDL, LDLCALC, TRIG, CHOLHDL, LDLDIRECT in the last 72 hours. Thyroid Function Tests: No results for input(s): TSH, T4TOTAL, FREET4, T3FREE, THYROIDAB in the last 72 hours. Anemia Panel: No results for input(s): VITAMINB12, FOLATE, FERRITIN, TIBC, IRON, RETICCTPCT in the last 72 hours. Sepsis Labs: Recent Labs  Lab 09/16/19 2005  LATICACIDVEN 1.0    Recent Results (from the past 240 hour(s))  Urine culture     Status: Abnormal   Collection Time: 09/09/19  6:09 PM   Specimen: Urine, Clean Catch  Result Value Ref Range Status   Specimen Description URINE, CLEAN CATCH  Final   Special Requests   Final    NONE Performed at [facility], [place] [address] [place], [place] [address]    Culture MULTIPLE SPECIES PRESENT, SUGGEST RECOLLECTION (A)  Final   Report Status 09/11/2019 FINAL  Final  SARS CORONAVIRUS 2 (TAT 6-24 HRS) Nasopharyngeal Nasopharyngeal Swab     Status: None   Collection Time: 09/16/19  9:05 PM   Specimen: Nasopharyngeal Swab  Result Value Ref Range Status   SARS Coronavirus 2 NEGATIVE NEGATIVE Final    Comment: (NOTE) SARS-CoV-2 target nucleic acids are NOT DETECTED. The SARS-CoV-2 RNA is generally detectable in upper and lower respiratory specimens during the acute phase of infection. Negative results do not preclude SARS-CoV-2 infection, do not rule out co-infections with other pathogens, and should not be used as the sole basis for treatment or other patient management decisions. Negative results must be combined with clinical observations, patient history, and epidemiological information. The expected result is Negative. Fact Sheet for Patients: SugarRoll.be Fact Sheet  for Healthcare Providers: https://www.woods-mathews.com/ This test is not yet approved or cleared by the [place] FDA and  has been authorized for detection and/or diagnosis of SARS-CoV-2 by FDA under an Emergency Use Authorization (EUA). This EUA will remain  in effect (meaning this test can be used) for the duration of the COVID-19 declaration  under Section 56 4(b)(1) of the Act, 21 U.S.C. section 360bbb-3(b)(1), unless the authorization is terminated or revoked sooner. Performed at [facility], [place] [address], [place], [place] [address]   Urine culture     Status: Abnormal   Collection Time: 09/16/19  9:15 PM   Specimen: Urine, Clean Catch  Result Value Ref Range Status   Specimen Description URINE, CLEAN CATCH  Final   Special Requests   Final    NONE Performed at [facility], [place] [address] [place], [place] [address]    Culture 80,000 COLONIES/mL ENTEROBACTER AEROGENES (A)  Final   Report Status 09/18/2019 FINAL  Final   Organism ID, Bacteria ENTEROBACTER AEROGENES (A)  Final      Susceptibility   Enterobacter aerogenes - MIC*    CEFAZOLIN >=64 RESISTANT Resistant     CEFTRIAXONE <=1 SENSITIVE Sensitive     CIPROFLOXACIN <=0.25 SENSITIVE Sensitive     GENTAMICIN <=1 SENSITIVE Sensitive     IMIPENEM 1 SENSITIVE Sensitive     NITROFURANTOIN 64 INTERMEDIATE Intermediate     TRIMETH/SULFA <=20 SENSITIVE Sensitive     PIP/TAZO <=4 SENSITIVE Sensitive     * 80,000 COLONIES/mL ENTEROBACTER AEROGENES  Blood Culture (routine x 2)     Status: None (Preliminary result)   Collection Time: 09/16/19  9:24 PM   Specimen: BLOOD  Result Value Ref Range Status   Specimen Description BLOOD LEFT ANTECUBITAL  Final   Special Requests   Final    BOTTLES DRAWN AEROBIC AND ANAEROBIC Blood Culture adequate volume   Culture   Final    NO GROWTH 2 DAYS Performed at [facility], [address], [place], [place] [address]    Report Status PENDING   Incomplete  Blood Culture (routine x 2)     Status: None (Preliminary result)   Collection Time: 09/16/19  9:40 PM   Specimen: BLOOD RIGHT HAND  Result Value Ref Range Status   Specimen Description BLOOD RIGHT HAND  Final   Special Requests   Final    BOTTLES DRAWN AEROBIC ONLY Blood Culture results may not be optimal due to an inadequate volume of blood received in culture bottles   Culture   Final    NO GROWTH 2 DAYS Performed at [facility], [place] [address] [place], [place] [address]    Report Status PENDING  Incomplete  CSF culture     Status: None (Preliminary result)   Collection Time: 09/16/19 11:49 PM   Specimen: CSF; Cerebrospinal Fluid  Result Value Ref Range Status   Specimen Description CSF  Final   Special Requests NONE  Final   Gram Stain NO WBC SEEN NO ORGANISMS SEEN CYTOSPIN SMEAR   Final   Culture   Final    NO GROWTH 1 DAY Performed at [facility], [place] [address] [place], [place] [address]    Report Status PENDING  Incomplete      Radiology Studies: Ct Head Wo Contrast  Result Date: 09/16/2019 CLINICAL DATA:  Head trauma, headache for 2 days EXAM: CT HEAD WITHOUT CONTRAST TECHNIQUE: Contiguous axial images were obtained from the base of the skull through the vertex without intravenous contrast. COMPARISON:  September 06, 2016 FINDINGS: Brain: No evidence of acute territorial infarction, hemorrhage, hydrocephalus,extra-axial collection or mass lesion/mass effect. The ventricles and sulci are normal in appearance. Low-attenuation changes in the deep white matter consistent with small vessel ischemia. Vascular: No hyperdense vessel or unexpected calcification. Skull:  The skull is intact. No fracture or focal lesion identified. Sinuses/Orbits: The visualized paranasal sinuses and mastoid air cells are clear. The orbits and globes intact. Other: None IMPRESSION: No acute intracranial abnormality. Findings consistent with mild chronic small vessel ischemia  Electronically Signed   By: Prudencio Pair M.D.   On: 09/16/2019 19:14   Dg Chest Port 1 View  Result Date: 09/16/2019 CLINICAL DATA:  Evaluate for sepsis. EXAM: PORTABLE CHEST 1 VIEW COMPARISON:  Chest radiograph 05/18/2019 FINDINGS: Monitoring leads overlie the patient. Low lung volumes. Stable cardiac and mediastinal contours. Interval development of bilateral interstitial pulmonary opacities. No pleural effusion or pneumothorax. IMPRESSION: Limited low volume portable exam. Bilateral interstitial pulmonary opacities may represent edema in the appropriate clinical setting. Electronically Signed   By: Lovey Newcomer M.D.   On: 09/16/2019 21:09     Scheduled Meds:  amLODipine  10 mg Oral Daily   DULoxetine  60 mg Oral Daily   enoxaparin (LOVENOX) injection  65 mg Subcutaneous Q24H   ezetimibe  10 mg Oral Daily   folic acid  2 mg Oral Daily   insulin aspart  0-5 Units Subcutaneous QHS   insulin aspart  0-9 Units Subcutaneous TID WC   metoprolol tartrate  50 mg Oral BID   sulfamethoxazole-trimethoprim  1 tablet Oral Q12H   tiZANidine  4 mg Oral Q6H   Continuous Infusions:   LOS: 1 day    Time spent: 25 minutes spent in the coordination of care today.    Jonnie Finner, DO [facility] Pager [phone]  If 7PM-7AM, please contact night-coverage [email or web address] Password [email or web address] 09/18/2019, 12:03 PM

## 2019-09-19 ENCOUNTER — Telehealth: Payer: Self-pay | Admitting: Pharmacist

## 2019-09-19 DIAGNOSIS — G9341 Metabolic encephalopathy: Secondary | ICD-10-CM

## 2019-09-19 DIAGNOSIS — A419 Sepsis, unspecified organism: Principal | ICD-10-CM

## 2019-09-19 DIAGNOSIS — I10 Essential (primary) hypertension: Secondary | ICD-10-CM

## 2019-09-19 LAB — GLUCOSE, CAPILLARY
Glucose-Capillary: 117 mg/dL — ABNORMAL HIGH (ref 70–99)
Glucose-Capillary: 114 mg/dL — ABNORMAL HIGH (ref 70–99)

## 2019-09-19 MED ORDER — METHOTREXATE SODIUM CHEMO INJECTION 50 MG/2ML: 20.0000 mg | INTRAMUSCULAR | Status: DC

## 2019-09-19 MED ORDER — SULFAMETHOXAZOLE-TRIMETHOPRIM 800-160 MG PO TABS: 1.0000 | ORAL_TABLET | Freq: Two times a day (BID) | ORAL | 0 refills | Status: AC

## 2019-09-19 MED ORDER — METFORMIN HCL 500 MG PO TABS: 500.0000 mg | ORAL_TABLET | Freq: Two times a day (BID) | ORAL | Status: DC

## 2019-09-19 NOTE — Progress Notes (Signed)
Pt given discharge instructions, prescriptions, and care notes. Home meds returned from Pharmacy. Pt verbalized understanding AEB no further questions or concerns at this time. IV was discontinued, no redness, pain, or swelling noted at this time. Telemetry discontinued and Centralized Telemetry was notified. Pt left the floor via wheelchair with staff in stable condition.

## 2019-09-19 NOTE — Telephone Encounter (Signed)
Prior Authorization request received from [facility] for [place] via fax.  Humira is no longer on plan's formulary and will need to submit a non-formulary PA. Please submit via covermymeds.    Mariella Saa, PharmD, [place], [place] Clinical Specialty Pharmacist [phone]  09/19/2019 4:05 PM

## 2019-09-19 NOTE — Discharge Summary (Signed)
Physician Discharge Summary  Shelby Robertson [id number] DOB: May 15, 1971 DOA: 09/16/2019  PCP: Minette Brine, FNP  Admit date: 09/16/2019 Discharge date: 09/19/2019  Admitted From: Home Disposition: Home  Recommendations for Outpatient Follow-up:  1. Follow up with PCP in 1 week with repeat CBC/BMP 2. Follow up in ED if symptoms worsen or new appear   Home Health: No Equipment/Devices: None  Discharge Condition: Stable CODE STATUS: Full Diet recommendation: Heart healthy/carb modified  Brief/Interim Summary: 48 year old female with history of rheumatoid arthritis, fibromyalgia, hypertension, recent abscess of the left distal lower extremity, UTI presented on 09/16/2019 with altered mental status and sepsis secondary to UTI.  She was started on broad-spectrum antibiotics.  During the hospitalization, her condition improved.  Urine culture grew Enterobacter and antibiotics were changed to Bactrim.  She feels much better and she will be discharged home today on oral Bactrim for 5 more days.   Discharge Diagnoses:  Sepsis secondary to UTI: Present on admission Acute metabolic encephalopathy -Presented with altered mental status.  Blood cultures negative so far.  LP was negative for meningitis -Mental status is much improved. -COVID-19 negative. -Initially started on broad-spectrum antibiotics.  Urine culture grew Enterobacter; antibiotic switched to Bactrim.  Feels much better and wants to go home.  Will discharge on Bactrim for 5 more days. -Sepsis is resolved.  Hypertension -Resume home regimen  Hyperlipidemia -Continue Zetia  Diabetes mellitus type 2 -Resume metformin.  Carb modified diet  Rheumatoid arthritis -Resume home regimen.  Morbid obesity -Outpatient follow-up   Discharge Instructions  Discharge Instructions    Diet - low sodium heart healthy   Complete by: As directed    Diet Carb Modified   Complete by: As directed    Increase activity slowly    Complete by: As directed      Allergies as of 09/19/2019   No Known Allergies     Medication List    STOP taking these medications   cephALEXin 500 MG capsule Commonly known as: KEFLEX   tiZANidine 4 MG capsule Commonly known as: ZANAFLEX     TAKE these medications   acetaminophen 650 MG CR tablet Commonly known as: TYLENOL Take 1,300 mg by mouth every 8 (eight) hours as needed for pain.   amLODipine 10 MG tablet Commonly known as: NORVASC TAKE 1 TABLET BY MOUTH EVERY DAY   aspirin 325 MG tablet Take 325 mg by mouth every 6 (six) hours as needed (pain).   B-D TB SYRINGE 1CC/27GX1/2" 27G X 1/2" 1 ML Misc Generic drug: TUBERCULIN SYR 1CC/27GX1/2" USE 1 SYRINGE TO INJECT METHOTREXATE INTO THE SKIN ONCE A WEEK.   diclofenac sodium 1 % Gel Commonly known as: VOLTAREN Apply 2 grams to 4 grams topically to affected area up to 4 times daily PRN What changed:   how much to take  how to take this  when to take this  reasons to take this  additional instructions   diphenhydrAMINE 25 MG tablet Commonly known as: BENADRYL Take 25 mg by mouth every 6 (six) hours as needed for itching, allergies or sleep.   DULoxetine 60 MG capsule Commonly known as: CYMBALTA Take 60 mg by mouth daily.   ezetimibe 10 MG tablet Commonly known as: ZETIA TAKE 1 TABLET BY MOUTH EVERY DAY   folic acid 1 MG tablet Commonly known as: FOLVITE Take 2 tablets (2 mg total) by mouth daily.   Humira Pen 40 MG/0.8ML Pnkt Generic drug: Adalimumab INJECT 1 PEN INTO THE SKIN EVERY 14 DAYS What changed:  See the new instructions.  Another medication with the same name was removed. Continue taking this medication, and follow the directions you see here.   hydrochlorothiazide 12.5 MG tablet Commonly known as: HYDRODIURIL Take 1 tablet (12.5 mg total) by mouth daily as needed. What changed: reasons to take this   HYDROcodone-acetaminophen 10-325 MG tablet Commonly known as: NORCO Take 1  tablet by mouth every 6 (six) hours.   metFORMIN 500 MG tablet Commonly known as: GLUCOPHAGE Take 1 tablet (500 mg total) by mouth 2 (two) times daily with a meal.   methotrexate 50 MG/2ML injection Inject 0.8 mLs (20 mg total) into the skin See admin instructions. Inject 0.8 mls (20 mg) subcutaneously every 14 days as needed for severe pain (use with Humira injection) What changed: See the new instructions.   metoprolol tartrate 50 MG tablet Commonly known as: LOPRESSOR TAKE 1 TABLET (50 MG TOTAL) BY MOUTH 2 (TWO) TIMES DAILY. PLEASE SCHEDULE APPOINTMENT FOR REFILLS.   sulfamethoxazole-trimethoprim 800-160 MG tablet Commonly known as: BACTRIM DS Take 1 tablet by mouth every 12 (twelve) hours for 5 days.      Follow-up Information    Minette Brine, FNP. Schedule an appointment as soon as possible for a visit in 1 week(s).   Specialty: General Practice Contact information: [address] [place] [address] [phone]          No Known Allergies  Consultations: None  Procedures/Studies: Ct Head Wo Contrast  Result Date: 09/16/2019 CLINICAL DATA:  Head trauma, headache for 2 days EXAM: CT HEAD WITHOUT CONTRAST TECHNIQUE: Contiguous axial images were obtained from the base of the skull through the vertex without intravenous contrast. COMPARISON:  September 06, 2016 FINDINGS: Brain: No evidence of acute territorial infarction, hemorrhage, hydrocephalus,extra-axial collection or mass lesion/mass effect. The ventricles and sulci are normal in appearance. Low-attenuation changes in the deep white matter consistent with small vessel ischemia. Vascular: No hyperdense vessel or unexpected calcification. Skull: The skull is intact. No fracture or focal lesion identified. Sinuses/Orbits: The visualized paranasal sinuses and mastoid air cells are clear. The orbits and globes intact. Other: None IMPRESSION: No acute intracranial abnormality. Findings consistent with mild  chronic small vessel ischemia Electronically Signed   By: Prudencio Pair M.D.   On: 09/16/2019 19:14   Dg Chest Port 1 View  Result Date: 09/16/2019 CLINICAL DATA:  Evaluate for sepsis. EXAM: PORTABLE CHEST 1 VIEW COMPARISON:  Chest radiograph 05/18/2019 FINDINGS: Monitoring leads overlie the patient. Low lung volumes. Stable cardiac and mediastinal contours. Interval development of bilateral interstitial pulmonary opacities. No pleural effusion or pneumothorax. IMPRESSION: Limited low volume portable exam. Bilateral interstitial pulmonary opacities may represent edema in the appropriate clinical setting. Electronically Signed   By: Lovey Newcomer M.D.   On: 09/16/2019 21:09       Subjective: Patient seen and examined at bedside.  She feels much better and wants to go home.  No overnight fever or vomiting.  Discharge Exam: Vitals:   09/18/19 2129 09/19/19 0438  BP: 116/69 131/84  Pulse: 88 66  Resp: 18 18  Temp: 98.1 F (36.7 C) 98.1 F (36.7 C)  SpO2: 96% 98%    General: Pt is alert, awake, not in acute distress Cardiovascular: rate controlled, S1/S2 + Respiratory: bilateral decreased breath sounds at bases with some scattered crackles Abdominal: Soft, morbidly obese, NT, ND, bowel sounds + Extremities: no edema, no cyanosis    The results of significant diagnostics from this hospitalization (including imaging, microbiology, ancillary and  laboratory) are listed below for reference.     Microbiology: Recent Results (from the past 240 hour(s))  Urine culture     Status: Abnormal   Collection Time: 09/09/19  6:09 PM   Specimen: Urine, Clean Catch  Result Value Ref Range Status   Specimen Description URINE, CLEAN CATCH  Final   Special Requests   Final    NONE Performed at [facility], [place] [address], [place], [place] [address]    Culture MULTIPLE SPECIES PRESENT, SUGGEST RECOLLECTION (A)  Final   Report Status 09/11/2019 FINAL  Final  SARS CORONAVIRUS 2 (TAT 6-24  HRS) Nasopharyngeal Nasopharyngeal Swab     Status: None   Collection Time: 09/16/19  9:05 PM   Specimen: Nasopharyngeal Swab  Result Value Ref Range Status   SARS Coronavirus 2 NEGATIVE NEGATIVE Final    Comment: (NOTE) SARS-CoV-2 target nucleic acids are NOT DETECTED. The SARS-CoV-2 RNA is generally detectable in upper and lower respiratory specimens during the acute phase of infection. Negative results do not preclude SARS-CoV-2 infection, do not rule out co-infections with other pathogens, and should not be used as the sole basis for treatment or other patient management decisions. Negative results must be combined with clinical observations, patient history, and epidemiological information. The expected result is Negative. Fact Sheet for Patients: SugarRoll.be Fact Sheet for Healthcare Providers: https://www.woods-mathews.com/ This test is not yet approved or cleared by the [place] FDA and  has been authorized for detection and/or diagnosis of SARS-CoV-2 by FDA under an Emergency Use Authorization (EUA). This EUA will remain  in effect (meaning this test can be used) for the duration of the COVID-19 declaration under Section 56 4(b)(1) of the Act, 21 U.S.C. section 360bbb-3(b)(1), unless the authorization is terminated or revoked sooner. Performed at [facility], [place] [address], [place], [place] [address]   Urine culture     Status: Abnormal   Collection Time: 09/16/19  9:15 PM   Specimen: Urine, Clean Catch  Result Value Ref Range Status   Specimen Description URINE, CLEAN CATCH  Final   Special Requests   Final    NONE Performed at [facility], [place] [address] [place], [place] [address]    Culture 80,000 COLONIES/mL ENTEROBACTER AEROGENES (A)  Final   Report Status 09/18/2019 FINAL  Final   Organism ID, Bacteria ENTEROBACTER AEROGENES (A)  Final      Susceptibility   Enterobacter aerogenes - MIC*     CEFAZOLIN >=64 RESISTANT Resistant     CEFTRIAXONE <=1 SENSITIVE Sensitive     CIPROFLOXACIN <=0.25 SENSITIVE Sensitive     GENTAMICIN <=1 SENSITIVE Sensitive     IMIPENEM 1 SENSITIVE Sensitive     NITROFURANTOIN 64 INTERMEDIATE Intermediate     TRIMETH/SULFA <=20 SENSITIVE Sensitive     PIP/TAZO <=4 SENSITIVE Sensitive     * 80,000 COLONIES/mL ENTEROBACTER AEROGENES  Blood Culture (routine x 2)     Status: None (Preliminary result)   Collection Time: 09/16/19  9:24 PM   Specimen: BLOOD  Result Value Ref Range Status   Specimen Description BLOOD LEFT ANTECUBITAL  Final   Special Requests   Final    BOTTLES DRAWN AEROBIC AND ANAEROBIC Blood Culture adequate volume   Culture   Final    NO GROWTH 3 DAYS Performed at [facility], [address] [place], [place] [address]    Report Status PENDING  Incomplete  Blood Culture (routine x 2)     Status:  None (Preliminary result)   Collection Time: 09/16/19  9:40 PM   Specimen: BLOOD RIGHT HAND  Result Value Ref Range Status   Specimen Description BLOOD RIGHT HAND  Final   Special Requests   Final    BOTTLES DRAWN AEROBIC ONLY Blood Culture results may not be optimal due to an inadequate volume of blood received in culture bottles   Culture   Final    NO GROWTH 3 DAYS Performed at [facility], [place] [address] [place], [place] [address]    Report Status PENDING  Incomplete  CSF culture     Status: None (Preliminary result)   Collection Time: 09/16/19 11:49 PM   Specimen: CSF; Cerebrospinal Fluid  Result Value Ref Range Status   Specimen Description CSF  Final   Special Requests NONE  Final   Gram Stain NO WBC SEEN NO ORGANISMS SEEN CYTOSPIN SMEAR   Final   Culture   Final    NO GROWTH 2 DAYS Performed at [facility], [place] [address], [place],  [address]    Report Status PENDING  Incomplete     Labs: BNP (last 3 results) No results for input(s): BNP in the last 8760 hours. Basic Metabolic  Panel: Recent Labs  Lab 09/16/19 1840 09/16/19 1903 09/17/19 0638 09/18/19 0033  NA 137 137 139 139  K 4.3 4.2 3.7 3.8  CL 101  --  104 104  CO2 26  --  26 26  GLUCOSE 129*  --  127* 125*  BUN 7  --  6 13  CREATININE 0.61  --  0.55 0.65  CALCIUM 9.2  --  9.3 9.2  MG  --   --   --  2.0  PHOS  --   --   --  2.7   Liver Function Tests: Recent Labs  Lab 09/16/19 1840 09/17/19 0638 09/18/19 0033  AST 26 15  --   ALT 15 13  --   ALKPHOS 83 77  --   BILITOT 0.9 0.7  --   PROT 8.3* 8.1  --   ALBUMIN 3.7 3.5 3.4*   No results for input(s): LIPASE, AMYLASE in the last 168 hours. No results for input(s): AMMONIA in the last 168 hours. CBC: Recent Labs  Lab 09/16/19 1840 09/16/19 1903 09/17/19 0638 09/18/19 0033  WBC 10.1  --  8.8 10.1  NEUTROABS 7.4  --   --  5.8  HGB 14.6 15.0 13.5 12.9  HCT 42.3 44.0 40.3 38.4  MCV 84.8  --  88.0 85.9  PLT 347  --  329 313   Cardiac Enzymes: No results for input(s): CKTOTAL, CKMB, CKMBINDEX, TROPONINI in the last 168 hours. BNP: Invalid input(s): POCBNP CBG: Recent Labs  Lab 09/18/19 1223 09/18/19 1640 09/18/19 2127 09/19/19 0819 09/19/19 1146  GLUCAP 139* 115* 107* 117* 114*   D-Dimer Recent Labs    09/17/19 0638  DDIMER 0.44   Hgb A1c Recent Labs    09/18/19 1216  HGBA1C 7.0*   Lipid Profile No results for input(s): CHOL, HDL, LDLCALC, TRIG, CHOLHDL, LDLDIRECT in the last 72 hours. Thyroid function studies No results for input(s): TSH, T4TOTAL, T3FREE, THYROIDAB in the last 72 hours.  Invalid input(s): FREET3 Anemia work up No results for input(s): VITAMINB12, FOLATE, FERRITIN, TIBC, IRON, RETICCTPCT in the last 72 hours. Urinalysis    Component Value Date/Time   COLORURINE YELLOW 09/16/2019 2132   APPEARANCEUR CLEAR 09/16/2019 2132   LABSPEC 1.009 09/16/2019 2132  PHURINE 7.0 09/16/2019 2132   GLUCOSEU NEGATIVE 09/16/2019 2132   HGBUR LARGE (A) 09/16/2019 2132   HGBUR small 05/04/2010 [place] 09/16/2019 2132   [place] 09/16/2019 2132   PROTEINUR 30 (A) 09/16/2019 2132   UROBILINOGEN 0.2 09/09/2019 1750   NITRITE NEGATIVE 09/16/2019 2132   LEUKOCYTESUR TRACE (A) 09/16/2019 2132   Sepsis Labs Invalid input(s): PROCALCITONIN,  WBC,  LACTICIDVEN Microbiology Recent Results (from the past 240 hour(s))  Urine culture     Status: Abnormal   Collection Time: 09/09/19  6:09 PM   Specimen: Urine, Clean Catch  Result Value Ref Range Status   Specimen Description URINE, CLEAN CATCH  Final   Special Requests   Final    NONE Performed at [facility], [place] [address] [place], [place] [address]    Culture MULTIPLE SPECIES PRESENT, SUGGEST RECOLLECTION (A)  Final   Report Status 09/11/2019 FINAL  Final  SARS CORONAVIRUS 2 (TAT 6-24 HRS) Nasopharyngeal Nasopharyngeal Swab     Status: None   Collection Time: 09/16/19  9:05 PM   Specimen: Nasopharyngeal Swab  Result Value Ref Range Status   SARS Coronavirus 2 NEGATIVE NEGATIVE Final    Comment: (NOTE) SARS-CoV-2 target nucleic acids are NOT DETECTED. The SARS-CoV-2 RNA is generally detectable in upper and lower respiratory specimens during the acute phase of infection. Negative results do not preclude SARS-CoV-2 infection, do not rule out co-infections with other pathogens, and should not be used as the sole basis for treatment or other patient management decisions. Negative results must be combined with clinical observations, patient history, and epidemiological information. The expected result is Negative. Fact Sheet for Patients: SugarRoll.be Fact Sheet for Healthcare Providers: https://www.woods-mathews.com/ This test is not yet approved or cleared by the [place] FDA and  has been authorized for detection and/or diagnosis of SARS-CoV-2 by FDA under an Emergency Use Authorization (EUA). This EUA will remain  in effect (meaning this test can be  used) for the duration of the COVID-19 declaration under Section 56 4(b)(1) of the Act, 21 U.S.C. section 360bbb-3(b)(1), unless the authorization is terminated or revoked sooner. Performed at [facility], [place] [address] [place], [place] [address]   Urine culture     Status: Abnormal   Collection Time: 09/16/19  9:15 PM   Specimen: Urine, Clean Catch  Result Value Ref Range Status   Specimen Description URINE, CLEAN CATCH  Final   Special Requests   Final    NONE Performed at [facility], [place] [address], [place], [place] [address]    Culture 80,000 COLONIES/mL ENTEROBACTER AEROGENES (A)  Final   Report Status 09/18/2019 FINAL  Final   Organism ID, Bacteria ENTEROBACTER AEROGENES (A)  Final      Susceptibility   Enterobacter aerogenes - MIC*    CEFAZOLIN >=64 RESISTANT Resistant     CEFTRIAXONE <=1 SENSITIVE Sensitive     CIPROFLOXACIN <=0.25 SENSITIVE Sensitive     GENTAMICIN <=1 SENSITIVE Sensitive     IMIPENEM 1 SENSITIVE Sensitive     NITROFURANTOIN 64 INTERMEDIATE Intermediate     TRIMETH/SULFA <=20 SENSITIVE Sensitive     PIP/TAZO <=4 SENSITIVE Sensitive     * 80,000 COLONIES/mL ENTEROBACTER AEROGENES  Blood Culture (routine x 2)     Status: None (Preliminary result)   Collection Time: 09/16/19  9:24 PM   Specimen: BLOOD  Result Value Ref Range Status   Specimen Description BLOOD LEFT ANTECUBITAL  Final  Special Requests   Final    BOTTLES DRAWN AEROBIC AND ANAEROBIC Blood Culture adequate volume   Culture   Final    NO GROWTH 3 DAYS Performed at [facility], [place] [address], [place], [place] [address]    Report Status PENDING  Incomplete  Blood Culture (routine x 2)     Status: None (Preliminary result)   Collection Time: 09/16/19  9:40 PM   Specimen: BLOOD RIGHT HAND  Result Value Ref Range Status   Specimen Description BLOOD RIGHT HAND  Final   Special Requests   Final    BOTTLES DRAWN AEROBIC ONLY Blood Culture results may not be optimal  due to an inadequate volume of blood received in culture bottles   Culture   Final    NO GROWTH 3 DAYS Performed at [facility], [place] [address], [place], [place] [address]    Report Status PENDING  Incomplete  CSF culture     Status: None (Preliminary result)   Collection Time: 09/16/19 11:49 PM   Specimen: CSF; Cerebrospinal Fluid  Result Value Ref Range Status   Specimen Description CSF  Final   Special Requests NONE  Final   Gram Stain NO WBC SEEN NO ORGANISMS SEEN CYTOSPIN SMEAR   Final   Culture   Final    NO GROWTH 2 DAYS Performed at [facility], [place] [address], [place], [place] [address]    Report Status PENDING  Incomplete     Time coordinating discharge: 35 minutes  SIGNED:   Aline August, MD  [facility] 09/19/2019, 2:43 PM

## 2019-09-20 ENCOUNTER — Telehealth: Payer: Self-pay

## 2019-09-20 LAB — CSF CULTURE W GRAM STAIN
Culture: NO GROWTH
Gram Stain: NONE SEEN

## 2019-09-20 NOTE — Telephone Encounter (Signed)
Located new billing info  ID# for patient- [id number], [place], [id number], [id number]  Submitted a Prior Authorization request to [facility] for [place] via Cover My Meds. Will update once we receive a response.

## 2019-09-20 NOTE — Telephone Encounter (Signed)
Transition Care Management Follow-up Telephone Call  Date of discharge and from where: 09/19/2019 [facility]   How have you been since you were released from the hospital? Much better  Any questions or concerns? No   Items Reviewed:  Did the pt receive and understand the discharge instructions provided? Yes   Medications obtained and verified? Yes   Any new allergies since your discharge? No   Dietary orders reviewed? Yes  Do you have support at home? Yes   Other (ie: DME, Home Health, etc) Home health comes in  Functional Questionnaire: (I = Independent and D = Dependent) ADL's: I  Bathing/Dressing- I has a nurse assist getting in shower   Meal Prep- D son is taking care  Eating- I  Maintaining continence- I  Transferring/Ambulation- I  Managing Meds- I   Follow up appointments reviewed:    PCP Hospital f/u appt confirmed? Yes  Scheduled to see Minette Brine FNP on 09/27/2019 @ 2:00.  Are transportation arrangements needed? No   If their condition worsens, is the pt aware to call  their PCP or go to the ED? Yes  Was the patient provided with contact information for the PCP's office or ED? Yes  Was the pt encouraged to call back with questions or concerns? Yes

## 2019-09-20 NOTE — Telephone Encounter (Signed)
Was not able to locate active Part d coverage for patient. She is currently filling medications only with her Medicaid plan. May be a change for 2021. Will look for active coverage in the new year.  9:16 AM Beatriz Chancellor, CPhT

## 2019-09-20 NOTE — Telephone Encounter (Signed)
Received notification from [facility] regarding a prior authorization for [place]. Authorization has been APPROVED from 09/20/19 to 10/17/20.   Will send document to scan center.  Authorization # [id number] Phone # [phone]  9:53 AM Beatriz Chancellor, CPhT

## 2019-09-21 LAB — CULTURE, BLOOD (ROUTINE X 2)
Culture: NO GROWTH
Culture: NO GROWTH
Special Requests: ADEQUATE

## 2019-09-24 ENCOUNTER — Telehealth: Payer: Self-pay

## 2019-09-27 ENCOUNTER — Other Ambulatory Visit: Payer: Self-pay

## 2019-09-27 ENCOUNTER — Telehealth (INDEPENDENT_AMBULATORY_CARE_PROVIDER_SITE_OTHER): Payer: Medicare Other | Admitting: Nurse Practitioner

## 2019-09-27 ENCOUNTER — Encounter: Payer: Self-pay | Admitting: Nurse Practitioner

## 2019-09-27 ENCOUNTER — Inpatient Hospital Stay: Payer: Self-pay | Admitting: Nurse Practitioner

## 2019-09-27 VITALS — Ht 66.0 in | Wt 288.0 lb

## 2019-09-27 DIAGNOSIS — R7303 Prediabetes: Secondary | ICD-10-CM | POA: Diagnosis not present

## 2019-09-27 DIAGNOSIS — R319 Hematuria, unspecified: Secondary | ICD-10-CM

## 2019-09-27 DIAGNOSIS — I10 Essential (primary) hypertension: Secondary | ICD-10-CM | POA: Diagnosis not present

## 2019-09-27 DIAGNOSIS — E782 Mixed hyperlipidemia: Secondary | ICD-10-CM | POA: Diagnosis not present

## 2019-09-27 DIAGNOSIS — N390 Urinary tract infection, site not specified: Secondary | ICD-10-CM

## 2019-09-27 MED ORDER — EZETIMIBE 10 MG PO TABS: 10.0000 mg | ORAL_TABLET | Freq: Every day | ORAL | 1 refills | Status: DC

## 2019-09-27 MED ORDER — METFORMIN HCL 500 MG PO TABS: 500.0000 mg | ORAL_TABLET | Freq: Two times a day (BID) | ORAL | Status: DC

## 2019-09-27 MED ORDER — METOPROLOL TARTRATE 50 MG PO TABS: 50.0000 mg | ORAL_TABLET | Freq: Two times a day (BID) | ORAL | 1 refills | Status: DC

## 2019-09-27 MED ORDER — AMLODIPINE BESYLATE 10 MG PO TABS: 10.0000 mg | ORAL_TABLET | Freq: Every day | ORAL | 1 refills | Status: DC

## 2019-09-27 MED ORDER — FOLIC ACID 1 MG PO TABS: 2.0000 mg | ORAL_TABLET | Freq: Every day | ORAL | 1 refills | Status: DC

## 2019-09-27 NOTE — Progress Notes (Signed)
Virtual Visit via Video   This visit type was conducted due to national recommendations for restrictions regarding the COVID-19 Pandemic (e.g. social distancing) in an effort to limit this patient's exposure and mitigate transmission in our community.  Due to her co-morbid illnesses, this patient is at least at moderate risk for complications without adequate follow up.  This format is felt to be most appropriate for this patient at this time.  All issues noted in this document were discussed and addressed.  A limited physical exam was performed with this format.    This visit type was conducted due to national recommendations for restrictions regarding the COVID-19 Pandemic (e.g. social distancing) in an effort to limit this patient's exposure and mitigate transmission in our community.  Patients identity confirmed using two different identifiers.  This format is felt to be most appropriate for this patient at this time.  All issues noted in this document were discussed and addressed.  No physical exam was performed (except for noted visual exam findings with Video Visits).    Date:  09/27/2019   ID:  Shelby Robertson, DOB [birth year], MRN [id number]  Patient Location:  Home - spoke with Shelby Robertson  Provider location:   Office    Chief Complaint:  Hospital follow up for urinary tract infection  History of Present Illness:    Shelby Robertson is a 48 y.o. female who presents via video conferencing for a telehealth visit today.    The patient does not have symptoms concerning for COVID-19 infection (fever, chills, cough, or new shortness of breath).   Virtual visit for hospital admission follow up on 11/29-12/11/2018 for lethargy she was found to have a urinary tract infection.  She went to urgent care thought she had a urinary tract infection then a week later she "blinked out", unable to talk. Flat tire by the time she got back to [place] her god son, called family and then called  32, she woke up with multiple people around her bed.  She does not remember anything, she says she received a ticket for speeding. On her way back from [place].    She is feeling great now.  She had a COVID test but was sleep during that time but felt it.      Past Medical History:  Diagnosis Date  . Fibromyalgia   . Fibromyalgia   . Hypertension   . Osteoarthritis   . Rheumatoid arthritis(714.0)   . Rheumatoid arthritis(714.0)    No past surgical history on file.   Current Meds  Medication Sig  . acetaminophen (TYLENOL) 650 MG CR tablet Take 1,300 mg by mouth every 8 (eight) hours as needed for pain.  [name] amLODipine (NORVASC) 10 MG tablet TAKE 1 TABLET BY MOUTH EVERY DAY (Patient taking differently: Take 10 mg by mouth daily. )  . aspirin 325 MG tablet Take 325 mg by mouth every 6 (six) hours as needed (pain).  [name] B-D TB SYRINGE 1CC/27GX1/2" 27G X 1/2" 1 ML MISC USE 1 SYRINGE TO INJECT METHOTREXATE INTO THE SKIN ONCE A WEEK.  . diclofenac sodium (VOLTAREN) 1 % GEL Apply 2 grams to 4 grams topically to affected area up to 4 times daily PRN (Patient taking differently: Apply 2-4 g topically 4 (four) times daily as needed (pain). )  . DULoxetine (CYMBALTA) 60 MG capsule Take 60 mg by mouth daily.    [name] ezetimibe (ZETIA) 10 MG tablet TAKE 1 TABLET BY MOUTH EVERY DAY (Patient taking differently: Take 10 mg  by mouth daily. )  . folic acid (FOLVITE) 1 MG tablet Take 2 tablets (2 mg total) by mouth daily.  [name] HUMIRA PEN 40 MG/0.8ML PNKT INJECT 1 PEN INTO THE SKIN EVERY 14 DAYS (Patient taking differently: Inject 40 mg into the skin See admin instructions. Inject 0.8 mls (40 mg) subcutaneously every 2 weeks as needed for severe pain (use with methotrexate))  . hydrochlorothiazide (HYDRODIURIL) 12.5 MG tablet Take 1 tablet (12.5 mg total) by mouth daily as needed. (Patient taking differently: Take 12.5 mg by mouth daily as needed (fluid/edema). )  . HYDROcodone-acetaminophen (NORCO) 10-325 MG  tablet Take 1 tablet by mouth every 6 (six) hours.  . metFORMIN (GLUCOPHAGE) 500 MG tablet Take 1 tablet (500 mg total) by mouth 2 (two) times daily with a meal.  . methotrexate 50 MG/2ML injection Inject 0.8 mLs (20 mg total) into the skin See admin instructions. Inject 0.8 mls (20 mg) subcutaneously every 14 days as needed for severe pain (use with Humira injection)  . metoprolol tartrate (LOPRESSOR) 50 MG tablet TAKE 1 TABLET (50 MG TOTAL) BY MOUTH 2 (TWO) TIMES DAILY. PLEASE SCHEDULE APPOINTMENT FOR REFILLS.     Allergies:   Patient has no known allergies.   Social History   Tobacco Use  . Smoking status: Current Every Day Smoker    Packs/day: 0.50    Years: 20.00    Pack years: 10.00    Types: Cigarettes  . Smokeless tobacco: Never Used  Substance Use Topics  . Alcohol use: Not Currently  . Drug use: Not Currently     Family Hx: The patient's family history includes Diabetes in her mother; Healthy in her father; Heart attack in her maternal grandmother; Stroke in her maternal grandmother.  ROS:   Please see the history of present illness.    Review of Systems  Constitutional: Negative.   Respiratory: Negative.   Cardiovascular: Negative.   Genitourinary: Negative.   Neurological: Negative.   Psychiatric/Behavioral: Negative.     All other systems reviewed and are negative.   Labs/Other Tests and Data Reviewed:    Recent Labs: 09/17/2019: ALT 13 09/18/2019: BUN 13; Creatinine, Ser 0.65; Hemoglobin 12.9; Magnesium 2.0; Platelets 313; Potassium 3.8; Sodium 139   Recent Lipid Panel Lab Results  Component Value Date/Time   CHOL 202 (H) 01/10/2019 12:25 PM   TRIG 161 (H) 01/10/2019 12:25 PM   HDL 43 01/10/2019 12:25 PM   CHOLHDL 4.7 (H) 01/10/2019 12:25 PM   LDLCALC 127 (H) 01/10/2019 12:25 PM    Wt Readings from Last 3 Encounters:  09/27/19 288 lb (130.6 kg)  09/19/19 296 lb 6.4 oz (134.4 kg)  05/14/19 298 lb (135.2 kg)     Exam:    Vital Signs:  Ht _0   (1.676 m)   Wt 288 lb (130.6 kg)   BMI 46.48 kg/m     Physical Exam  Constitutional: She is oriented to person, place, and time and well-developed, well-nourished, and in no distress. No distress.  Pulmonary/Chest: Effort normal. No respiratory distress.  Neurological: She is alert and oriented to person, place, and time. No cranial nerve deficit.  Psychiatric: Mood, memory, affect and judgment normal.    ASSESSMENT & PLAN:    1. Urinary tract infection with hematuria, site unspecified  Recent hospitalization for urinary tract infection she is feeling better TCM Performed. A member of the clinical team spoke with the patient upon dischare. Discharge summary was reviewed in full detail during the visit. Meds reconciled and compared  to discharge meds. Medication list is updated and reviewed with the patient.  Greater than 50% face to face time was spent in counseling an coordination of care.  All questions were answered to the satisfaction of the patient.   - CBC without diff; Future - BMP8+eGFR; Future  2. Essential hypertension . B/P is controlled.  . CMP ordered to check renal function.  . The importance of regular exercise and dietary modification was stressed to the patient.  - amLODipine (NORVASC) 10 MG tablet; Take 1 tablet (10 mg total) by mouth daily.  Dispense: 90 tablet; Refill: 1 - metoprolol tartrate (LOPRESSOR) 50 MG tablet; Take 1 tablet (50 mg total) by mouth 2 (two) times daily. Please schedule appointment for refills.  Dispense: 180 tablet; Refill: 1  3. Mixed hyperlipidemia  Chronic, controlled  Continue with current medications - ezetimibe (ZETIA) 10 MG tablet; Take 1 tablet (10 mg total) by mouth daily.  Dispense: 90 tablet; Refill: 1  4. Prediabetes  Chronic, stable  Continue with current medications  Encouraged to limit intake of sugary foods and drinks  Encouraged to increase physical activity to 150 minutes per week as tolerated once she gets her  energy back  Will check her HgbA1c, already had her kidney functions done while hospitalized, normal. - Hemoglobin A1c; Future - metFORMIN (GLUCOPHAGE) 500 MG tablet; Take 1 tablet (500 mg total) by mouth 2 (two) times daily with a meal.  Dispense:     COVID-19 Education: The signs and symptoms of COVID-19 were discussed with the patient and how to seek care for testing (follow up with PCP or arrange E-visit).  The importance of social distancing was discussed today.  Patient Risk:   After full review of this patients clinical status, I feel that they are at least moderate risk at this time.  Time:   Today, I have spent 13 minutes/ seconds with the patient with telehealth technology discussing above diagnoses.     Medication Adjustments/Labs and Tests Ordered: Current medicines are reviewed at length with the patient today.  Concerns regarding medicines are outlined above.   Tests Ordered: No orders of the defined types were placed in this encounter.   Medication Changes: No orders of the defined types were placed in this encounter.   Disposition:  Follow up in 3 month(s)  Signed, Minette Brine, FNP

## 2019-10-01 NOTE — Progress Notes (Signed)
Virtual Visit via Telephone note  Location: Patient: home  Provider: Clinic  This service was conducted via virtual visit.   The patient was located at home. I was located in my office.  Consent was obtained prior to the virtual visit and is aware of possible charges through their insurance for this visit.  The patient is an established patient.  Dr. Estanislado Pandy, MD conducted the virtual visit and Hazel Sams, PA-C acted as scribe during the service.  Office staff helped with scheduling follow up visits after the service was conducted.     I discussed the limitations of evaluation and management by telemedicine and the availability of in person appointments. The patient expressed understanding and agreed to proceed.  CC: Medication monitoring  History of Present Illness: Patient is a 48 year old female with past medical history of seronegative rheumatoid arthritis, osteoarthritis, and fibromyalgia. She is on Humira 40 mg sq injections every 14 days, MTX 0.8 ml sq once weekly, and folic acid 2 mg po daily.  She denies any recent rheumatoid arthritis flares. She continues to have chronic pain in both knee joints, but she is having less difficulty with mobility since having visco gel injections. She states her fibromyalgia pain has been manageable recently as well.  She continues to have chronic fatigue related to insomnia.  She was recently hospitalized and treated with broad spectrum antibiotics for sepsis secondary to UTIs.  She also had an abscess on her left lower extremity, which has since healed. According to the patient she did not hold MTX or humira.    Review of Systems  Constitutional: Positive for malaise/fatigue. Negative for fever.  Eyes: Negative for photophobia, pain, discharge and redness.  Respiratory: Negative for cough, shortness of breath and wheezing.   Cardiovascular: Negative for chest pain and palpitations.  Gastrointestinal: Negative for blood in stool, constipation and  diarrhea.  Genitourinary: Negative for dysuria.  Musculoskeletal: Positive for joint pain and myalgias. Negative for back pain and neck pain.  Skin: Negative for rash.  Neurological: Negative for dizziness and headaches.  Psychiatric/Behavioral: Negative for depression. The patient has insomnia. The patient is not nervous/anxious.       Observations/Objective: Physical Exam  Constitutional: She is oriented to person, place, and time and well-developed, well-nourished, and in no distress.  HENT:  Head: Normocephalic and atraumatic.  Eyes: Conjunctivae are normal.  Pulmonary/Chest: Effort normal.  Neurological: She is alert and oriented to person, place, and time.  Psychiatric: Mood, memory, affect and judgment normal.   Patient reports morning stiffness for 0 minutes.   Patient denies nocturnal pain.  Difficulty dressing/grooming: Denies Difficulty climbing stairs: Reports Difficulty getting out of chair: Reports Difficulty using hands for taps, buttons, cutlery, and/or writing: Denies  She currently rates her RA a 1/10.   Assessment and Plan: Visit Diagnoses: Rheumatoid arthritis of multiple sites without rheumatoid factor ([place]) -She has not had any recent rheumatoid arthritis flares.  She has chronic pain in both knee joints but no nflammation at this time. She has had less difficulty with ambulating since having visco gel injections in both knee joints.  She has no other joint pain or inflammation at this time.  She is on Humira 40 mg sq injections every 14 days, MTX 0.8 ml sq once weekly, and folic acid 2 mg po daily. She reports she was recently discharged from the hospital after receiving broad spectrum antibiotics for sepsis secondary to a UTI.  She has not had any recurrence of urinary symptoms. She  also had a recent abscess of the left distal lower extremity, which has healed.  According to the patient she did not hold Humira or MTX during this time.  She was advised to hold  Humira and MTX anytime she has an infection and to resume once the infection has completely cleared.  She will continue on the current treatment regimen. She will follow up in 1-2 months.   High risk medication use - Humira 40 mg sq once every 14 days, MTX 0.8 ml weekly, folic acid 2 mg daily.  CBC on 09/18/19 and CMP on 09/16/29.  TB gold negative on 04/30/19.   Fibromyalgia -  her fibromyalgia pain has been manageable recently.  She continues to take Cymbalta 60 mg po daily and Zanaflex 4 mg at bedtime PRN for muscle spasms.  She has chronic fatigue related to insomnia.  The need for regular exercise and good sleep hygiene were discussed.   Trapezius muscle spasm -She has not had any trapezius muscle spasms recently.  She had trigger point injections on 04/30/19.    Primary osteoarthritis of both knees: She has chronic pain in both knee joints. She has no inflammation at this time.  She has noticed less difficulty with ambulation since having visco gel injections.   Other medical conditions are listed as follows:   Other fatigue: She has intermittent fatigue related to insomnia.    History of hypertension   History of hidradenitis suppurativa   History of hyperlipidemia    Follow Up Instructions: She will follow up in 1-2 months   I discussed the assessment and treatment plan with the patient. The patient was provided an opportunity to ask questions and all were answered. The patient agreed with the plan and demonstrated an understanding of the instructions.   The patient was advised to call back or seek an in-person evaluation if the symptoms worsen or if the condition fails to improve as anticipated.  I provided 25 minutes of non-face-to-face time during this encounter.   Bo Merino, MD   Scribed by-  Hazel Sams, PA-C

## 2019-10-02 ENCOUNTER — Telehealth (INDEPENDENT_AMBULATORY_CARE_PROVIDER_SITE_OTHER): Payer: Medicare Other | Admitting: Rheumatology

## 2019-10-02 ENCOUNTER — Other Ambulatory Visit: Payer: Self-pay

## 2019-10-02 ENCOUNTER — Encounter: Payer: Self-pay | Admitting: Rheumatology

## 2019-10-02 DIAGNOSIS — M797 Fibromyalgia: Secondary | ICD-10-CM

## 2019-10-02 DIAGNOSIS — Z79899 Other long term (current) drug therapy: Secondary | ICD-10-CM

## 2019-10-02 DIAGNOSIS — R5383 Other fatigue: Secondary | ICD-10-CM

## 2019-10-02 DIAGNOSIS — M0609 Rheumatoid arthritis without rheumatoid factor, multiple sites: Secondary | ICD-10-CM

## 2019-10-02 DIAGNOSIS — Z872 Personal history of diseases of the skin and subcutaneous tissue: Secondary | ICD-10-CM

## 2019-10-02 DIAGNOSIS — M170 Bilateral primary osteoarthritis of knee: Secondary | ICD-10-CM

## 2019-10-02 DIAGNOSIS — Z8639 Personal history of other endocrine, nutritional and metabolic disease: Secondary | ICD-10-CM

## 2019-10-05 ENCOUNTER — Other Ambulatory Visit: Payer: Self-pay | Admitting: Rheumatology

## 2019-10-05 NOTE — Telephone Encounter (Signed)
Last Visit: 10/02/2019 telemedicine  Next Visit: message sent to the front desk to schedule.  Labs: CBC on 09/18/19 and CMP on 09/16/29. TB Gold: 04/30/2019 negative   Okay to refill per Dr. Estanislado Pandy.

## 2019-10-10 ENCOUNTER — Telehealth: Payer: Self-pay | Admitting: Rheumatology

## 2019-10-10 NOTE — Telephone Encounter (Signed)
-----   Message from Shona Needles, RT sent at 10/02/2019 12:42 PM EST ----- Regarding: 1-2 MONTH F/U

## 2019-10-10 NOTE — Telephone Encounter (Signed)
LMOM for patient to call and schedule 1-2 month follow-up appointment. °

## 2019-10-11 ENCOUNTER — Encounter (HOSPITAL_COMMUNITY): Payer: Self-pay | Admitting: Urgent Care

## 2019-10-11 ENCOUNTER — Ambulatory Visit (HOSPITAL_COMMUNITY)
Admission: EM | Admit: 2019-10-11 | Discharge: 2019-10-11 | Disposition: A | Discharge status: Home / Self Care | Payer: Medicare Other | Attending: Family Medicine | Admitting: Family Medicine

## 2019-10-11 ENCOUNTER — Other Ambulatory Visit: Payer: Self-pay

## 2019-10-11 DIAGNOSIS — R0789 Other chest pain: Secondary | ICD-10-CM | POA: Diagnosis not present

## 2019-10-11 MED ORDER — ALBUTEROL SULFATE HFA 108 (90 BASE) MCG/ACT IN AERS: 2.0000 | INHALATION_SPRAY | RESPIRATORY_TRACT | 4 refills | Status: AC | PRN

## 2019-10-11 MED ORDER — ALBUTEROL SULFATE (2.5 MG/3ML) 0.083% IN NEBU: 2.5000 mg | INHALATION_SOLUTION | Freq: Four times a day (QID) | RESPIRATORY_TRACT | 12 refills | Status: DC | PRN

## 2019-10-11 NOTE — ED Triage Notes (Signed)
Pt presents with left rib pain; pt states when she gets overheated she coughs really hard and makes her left rib area hurt X 2 days.

## 2019-10-11 NOTE — ED Provider Notes (Signed)
[facility]    CSN: [id number] Arrival date & time: 10/11/19  1517      History   Chief Complaint Chief Complaint  Patient presents with  . Rib Pain    HPI Shelby Robertson is a 48 y.o. female.   [facility] patient presents with rib pain.   Pt presents with left rib pain; pt states when she gets overheated she coughs really hard and makes her left rib area hurt X 2 days.  She is being treated for RA with immunosuppressant medications.  Tested negative for SARS-2 on November 29th of this year.   Note from 10/02/2019 by rheumatologist: Patient is a 48 year old female with past medical history of seronegative rheumatoid arthritis, osteoarthritis, and fibromyalgia. She is on Humira 40 mg sq injections every 14 days, MTX 0.8 ml sq once weekly, and folic acid 2 mg po daily.  She denies any recent rheumatoid arthritis flares. She continues to have chronic pain in both knee joints, but she is having less difficulty with mobility since having visco gel injections. She states her fibromyalgia pain has been manageable recently as well.  She continues to have chronic fatigue related to insomnia.  She was recently hospitalized and treated with broad spectrum antibiotics for sepsis secondary to UTIs.  She also had an abscess on her left lower extremity, which has since healed. According to the patient she did not hold MTX or humira.        Past Medical History:  Diagnosis Date  . Fibromyalgia   . Fibromyalgia   . Hypertension   . Osteoarthritis   . Rheumatoid arthritis(714.0)   . Rheumatoid arthritis(714.0)     Patient Active Problem List   Diagnosis Date Noted  . Sepsis ([place]) 09/17/2019  . AMS (altered mental status) 09/17/2019  . Morbid obesity ([place]) 01/10/2019  . Prediabetes 01/10/2019  . Tobacco use disorder, continuous 01/10/2019  . Soft tissue swelling of back 01/10/2019  . Fibromyalgia 07/27/2017  . Other fatigue 07/27/2017  . History of hidradenitis  suppurativa 07/27/2017  . History of hypertension 07/27/2017  . History of hyperlipidemia 07/27/2017  . Smoker 07/27/2017  . High risk medication use 08/21/2016  . Rheumatoid arthritis of multiple sites without rheumatoid factor ([place]) 08/12/2016  . ARTHRITIS, GENERALIZED 07/14/2010  . HYPERTENSION, BENIGN ESSENTIAL 03/04/2009  . POLYURIA 03/04/2009  . FIBROMYALGIA 11/09/2007  . HIDRADENITIS SUPPURATIVA 07/11/2007  . Mixed hyperlipidemia 06/28/2007  . LEG EDEMA, BILATERAL 05/05/2007    History reviewed. No pertinent surgical history.  OB History   No obstetric history on file.      Home Medications    Prior to Admission medications   Medication Sig Start Date End Date Taking? Authorizing Provider  acetaminophen (TYLENOL) 650 MG CR tablet Take 1,300 mg by mouth every 8 (eight) hours as needed for pain.    [provider]  amLODipine (NORVASC) 10 MG tablet Take 1 tablet (10 mg total) by mouth daily. 09/27/19   Minette Brine, FNP  aspirin 325 MG tablet Take 325 mg by mouth every 6 (six) hours as needed (pain).    [provider]  B-D TB SYRINGE 1CC/27GX1/2" 27G X 1/2" 1 ML MISC USE 1 SYRINGE TO INJECT METHOTREXATE INTO THE SKIN ONCE A WEEK. 07/23/19   Bo Merino, MD  diclofenac sodium (VOLTAREN) 1 % GEL Apply 2 grams to 4 grams topically to affected area up to 4 times daily PRN Patient taking differently: Apply 2-4 g topically 4 (four) times daily as  needed (pain).  01/05/19   Ofilia Neas, PA-C  diphenhydrAMINE (BENADRYL) 25 MG tablet Take 25 mg by mouth every 6 (six) hours as needed for itching, allergies or sleep.    [provider]  DULoxetine (CYMBALTA) 60 MG capsule Take 60 mg by mouth daily.      [provider]  ezetimibe (ZETIA) 10 MG tablet Take 1 tablet (10 mg total) by mouth daily. 09/27/19   Minette Brine, FNP  folic acid (FOLVITE) 1 MG tablet Take 2 tablets (2 mg total) by mouth daily. 09/27/19   Minette Brine, FNP  HUMIRA PEN  40 MG/0.8ML PNKT INJECT 1 PEN INTO THE SKIN EVERY 14 DAYS 10/05/19   Bo Merino, MD  hydrochlorothiazide (HYDRODIURIL) 12.5 MG tablet Take 1 tablet (12.5 mg total) by mouth daily as needed. Patient taking differently: Take 12.5 mg by mouth daily as needed (fluid/edema).  04/12/19   Minette Brine, FNP  HYDROcodone-acetaminophen (NORCO) 10-325 MG tablet Take 1 tablet by mouth every 6 (six) hours. 08/20/19   [provider]  metFORMIN (GLUCOPHAGE) 500 MG tablet Take 1 tablet (500 mg total) by mouth 2 (two) times daily with a meal. 09/27/19   Minette Brine, FNP  methotrexate 50 MG/2ML injection Inject 0.8 mLs (20 mg total) into the skin See admin instructions. Inject 0.8 mls (20 mg) subcutaneously every 14 days as needed for severe pain (use with Humira injection) 09/19/19   Aline August, MD  metoprolol tartrate (LOPRESSOR) 50 MG tablet Take 1 tablet (50 mg total) by mouth 2 (two) times daily. Please schedule appointment for refills. 09/27/19   Minette Brine, FNP    Family History Family History  Problem Relation Age of Onset  . Diabetes Mother   . Healthy Father   . Heart attack Maternal Grandmother   . Stroke Maternal Grandmother     Social History Social History   Tobacco Use  . Smoking status: Current Every Day Smoker    Packs/day: 0.50    Years: 20.00    Pack years: 10.00    Types: Cigarettes  . Smokeless tobacco: Never Used  Substance Use Topics  . Alcohol use: Not Currently  . Drug use: Not Currently     Allergies   Patient has no known allergies.   Review of Systems Review of Systems   Physical Exam Triage Vital Signs ED Triage Vitals [10/11/19 1526]  Enc Vitals Group     BP      Pulse      Resp      Temp      Temp src      SpO2      Weight      Height      Head Circumference      Peak Flow      Pain Score 6     Pain Loc      Pain Edu?      Excl. in [place]?    No data found.  Updated Vital Signs BP 121/80 (BP Location: Left Arm)   Pulse  62   Temp 98.3 F (36.8 C) (Oral)   Resp 18   SpO2 98%    Physical Exam Vitals and nursing note reviewed.  Constitutional:      Appearance: Normal appearance.  HENT:     Head: Normocephalic.  Cardiovascular:     Rate and Rhythm: Normal rate.  Pulmonary:     Effort: Pulmonary effort is normal.     Breath sounds: Normal breath sounds.  Comments: Tender left rib #8 laterally Musculoskeletal:        General: Normal range of motion.     Cervical back: Normal range of motion and neck supple.  Skin:    General: Skin is warm and dry.  Neurological:     General: No focal deficit present.     Mental Status: She is alert.  Psychiatric:        Mood and Affect: Mood normal.      UC Treatments / Results  Labs (all labs ordered are listed, but only abnormal results are displayed) Labs Reviewed - No data to display  EKG   Radiology No results found.  Procedures Procedures (including critical care time)  Medications Ordered in UC Medications - No data to display  Initial Impression / Assessment and Plan / UC Course  I have reviewed the triage vital signs and the nursing notes.  Pertinent labs & imaging results that were available during my care of the patient were reviewed by me and considered in my medical decision making (see chart for details).    Final Clinical Impressions(s) / UC Diagnoses   Final diagnoses:  None   Discharge Instructions   None    ED Prescriptions    None     I have reviewed the PDMP during this encounter.   Robyn Haber, MD 10/11/19 [phone]

## 2019-10-15 MED FILL — HUMIRA PEN 40 MG/0.8ML PNKT: 40 | 28 days supply | Qty: 2 | Fill #0

## 2019-11-22 MED FILL — HUMIRA PEN 40 MG/0.8ML PNKT: 40 | 28 days supply | Qty: 2 | Fill #1

## 2019-12-25 MED FILL — HUMIRA PEN 40 MG/0.8ML PNKT: 40 | 28 days supply | Qty: 2 | Fill #2

## 2020-01-16 ENCOUNTER — Ambulatory Visit (INDEPENDENT_AMBULATORY_CARE_PROVIDER_SITE_OTHER): Payer: Medicare Other

## 2020-01-16 ENCOUNTER — Encounter: Payer: Self-pay | Admitting: Nurse Practitioner

## 2020-01-16 ENCOUNTER — Ambulatory Visit (INDEPENDENT_AMBULATORY_CARE_PROVIDER_SITE_OTHER): Payer: Medicare Other | Admitting: Nurse Practitioner

## 2020-01-16 ENCOUNTER — Other Ambulatory Visit: Payer: Self-pay

## 2020-01-16 VITALS — BP 142/82 | HR 95 | Temp 98.0°F | Ht 66.0 in | Wt 301.2 lb

## 2020-01-16 VITALS — BP 142/82 | HR 95 | Temp 98.0°F | Ht 66.0 in | Wt 301.1 lb

## 2020-01-16 DIAGNOSIS — R21 Rash and other nonspecific skin eruption: Secondary | ICD-10-CM | POA: Diagnosis not present

## 2020-01-16 DIAGNOSIS — Z0000 Encounter for general adult medical examination without abnormal findings: Secondary | ICD-10-CM | POA: Diagnosis not present

## 2020-01-16 DIAGNOSIS — R7303 Prediabetes: Secondary | ICD-10-CM

## 2020-01-16 DIAGNOSIS — E782 Mixed hyperlipidemia: Secondary | ICD-10-CM

## 2020-01-16 DIAGNOSIS — I10 Essential (primary) hypertension: Secondary | ICD-10-CM

## 2020-01-16 DIAGNOSIS — Z6841 Body Mass Index (BMI) 40.0 and over, adult: Secondary | ICD-10-CM

## 2020-01-16 DIAGNOSIS — M0609 Rheumatoid arthritis without rheumatoid factor, multiple sites: Secondary | ICD-10-CM

## 2020-01-16 DIAGNOSIS — Z23 Encounter for immunization: Secondary | ICD-10-CM

## 2020-01-16 LAB — POCT UA - MICROALBUMIN
Creatinine, POC: 200 mg/dL
Microalbumin Ur, POC: 80 mg/L

## 2020-01-16 LAB — POCT URINALYSIS DIPSTICK
Bilirubin, UA: NEGATIVE
Glucose, UA: NEGATIVE
Ketones, UA: NEGATIVE
Nitrite, UA: NEGATIVE
Protein, UA: NEGATIVE
Spec Grav, UA: 1.02 (ref 1.010–1.025)
Urobilinogen, UA: 0.2 E.U./dL
pH, UA: 6 (ref 5.0–8.0)

## 2020-01-16 MED ORDER — MOMETASONE FUROATE 0.1 % EX CREA: TOPICAL_CREAM | CUTANEOUS | 1 refills | Status: AC

## 2020-01-16 MED ORDER — BOOSTRIX 5-2.5-18.5 LF-MCG/0.5 IM SUSP: 0.5000 mL | Freq: Once | INTRAMUSCULAR | 0 refills | Status: AC

## 2020-01-16 NOTE — Progress Notes (Signed)
This visit occurred during the SARS-CoV-2 public health emergency.  Safety protocols were in place, including screening questions prior to the visit, additional usage of staff PPE, and extensive cleaning of exam room while observing appropriate contact time as indicated for disinfecting solutions.  Subjective:   Shelby Robertson is a 49 y.o. female who presents for Medicare Annual (Subsequent) preventive examination.  Review of Systems:  n/a Cardiac Risk Factors include: hypertension;obesity (BMI >30kg/m2);sedentary lifestyle;smoking/ tobacco exposure     Objective:     Vitals: BP (!) 142/82 (BP Location: Left Arm, Patient Position: Sitting, Cuff Size: Large)   Pulse 95   Temp 98 F (36.7 C) (Oral)   Ht 5\' 6"  (1.676 m)   Wt (!) 301 lb 3.2 oz (136.6 kg)   SpO2 99%   BMI 48.61 kg/m   Body mass index is 48.61 kg/m.  Advanced Directives 01/16/2020 09/17/2019 05/14/2019 02/23/2019 02/08/2019 01/10/2019 12/10/2018  Does Patient Have a Medical Advance Directive? No Yes No No No No No  Type of Advance Directive - [place]  Does patient want to make changes to medical advance directive? - No - Patient declined - - - - -  Copy of [place] in Chart? - No - copy requested - - - - -  Would patient like information on creating a medical advance directive? Yes (MAU/Ambulatory/Procedural Areas - Information given) - No - Patient declined Yes (MAU/Ambulatory/Procedural Areas - Information given) No - Guardian declined Yes (MAU/Ambulatory/Procedural Areas - Information given) No - Patient declined    Tobacco Social History   Tobacco Use  Smoking Status Current Every Day Smoker  . Packs/day: 0.50  . Years: 20.00  . Pack years: 10.00  . Types: Cigarettes  Smokeless Tobacco Never Used     Ready to quit: No Counseling given: Not Answered   Clinical Intake:  Pre-visit preparation completed: Yes  Pain : 0-10 Pain Score: 5  Pain Type: Chronic  pain Pain Location: Generalized Pain Descriptors / Indicators: Aching, Throbbing Pain Onset: More than a month ago Pain Frequency: Constant Pain Relieving Factors: rest helps  Pain Relieving Factors: rest helps  Nutritional Status: BMI > 30  Obese Nutritional Risks: None Diabetes: No  How often do you need to have someone help you when you read instructions, pamphlets, or other written materials from your doctor or pharmacy?: 1 - Never What is the last grade level you completed in school?: 2 years college  Interpreter Needed?: No  Information entered by :: NAllen LPN  Past Medical History:  Diagnosis Date  . Fibromyalgia   . Fibromyalgia   . Hypertension   . Osteoarthritis   . Rheumatoid arthritis(714.0)   . Rheumatoid arthritis(714.0)    History reviewed. No pertinent surgical history. Family History  Problem Relation Age of Onset  . Diabetes Mother   . Healthy Father   . Heart attack Maternal Grandmother   . Stroke Maternal Grandmother    Social History   Socioeconomic History  . Marital status: Single    Spouse name: Not on file  . Number of children: Not on file  . Years of education: Not on file  . Highest education level: Not on file  Occupational History  . Occupation: disability  Tobacco Use  . Smoking status: Current Every Day Smoker    Packs/day: 0.50    Years: 20.00    Pack years: 10.00    Types: Cigarettes  . Smokeless tobacco: Never Used  Substance and Sexual Activity  . Alcohol use: Not Currently  . Drug use: Not Currently    Types: Hydrocodone  . Sexual activity: Yes    Birth control/protection: Condom  Other Topics Concern  . Not on file  Social History Narrative  . Not on file   Social Determinants of Health   Financial Resource Strain: Low Risk   . Difficulty of Paying Living Expenses: Not hard at all  Food Insecurity: No Food Insecurity  . Worried About Charity fundraiser in the Last Year: Never true  . Ran Out of Food in the  Last Year: Never true  Transportation Needs: No Transportation Needs  . Lack of Transportation (Medical): No  . Lack of Transportation (Non-Medical): No  Physical Activity: Inactive  . Days of Exercise per Week: 0 days  . Minutes of Exercise per Session: 0 min  Stress: Stress Concern Present  . Feeling of Stress : To some extent  Social Connections: Moderately Isolated  . Frequency of Communication with Friends and Family: More than three times a week  . Frequency of Social Gatherings with Friends and Family: More than three times a week  . Attends Religious Services: Never  . Active Member of Clubs or Organizations: No  . Attends Archivist Meetings: Never  . Marital Status: Never married    Outpatient Encounter Medications as of 01/16/2020  Medication Sig  . acetaminophen (TYLENOL) 650 MG CR tablet Take 1,300 mg by mouth every 8 (eight) hours as needed for pain.  [name] albuterol (PROVENTIL) (2.5 MG/3ML) 0.083% nebulizer solution Take 3 mLs (2.5 mg total) by nebulization every 6 (six) hours as needed for wheezing or shortness of breath.  [name] albuterol (VENTOLIN HFA) 108 (90 Base) MCG/ACT inhaler Inhale 2 puffs into the lungs every 4 (four) hours as needed for wheezing or shortness of breath (cough, shortness of breath or wheezing.).  [name] amLODipine (NORVASC) 10 MG tablet Take 1 tablet (10 mg total) by mouth daily.  [name] aspirin 325 MG tablet Take 325 mg by mouth every 6 (six) hours as needed (pain).  [name] B-D TB SYRINGE 1CC/27GX1/2" 27G X 1/2" 1 ML MISC USE 1 SYRINGE TO INJECT METHOTREXATE INTO THE SKIN ONCE A WEEK.  . diclofenac sodium (VOLTAREN) 1 % GEL Apply 2 grams to 4 grams topically to affected area up to 4 times daily PRN (Patient taking differently: Apply 2-4 g topically 4 (four) times daily as needed (pain). )  . diphenhydrAMINE (BENADRYL) 25 MG tablet Take 25 mg by mouth every 6 (six) hours as needed for itching, allergies or sleep.  . DULoxetine (CYMBALTA) 60 MG capsule Take 60 mg  by mouth daily.    [name] ezetimibe (ZETIA) 10 MG tablet Take 1 tablet (10 mg total) by mouth daily.  . folic acid (FOLVITE) 1 MG tablet Take 2 tablets (2 mg total) by mouth daily.  [name] HUMIRA PEN 40 MG/0.8ML PNKT INJECT 1 PEN INTO THE SKIN EVERY 14 DAYS  . hydrochlorothiazide (HYDRODIURIL) 12.5 MG tablet Take 1 tablet (12.5 mg total) by mouth daily as needed. (Patient taking differently: Take 12.5 mg by mouth daily as needed (fluid/edema). )  . HYDROcodone-acetaminophen (NORCO) 10-325 MG tablet Take 1 tablet by mouth every 6 (six) hours.  . metFORMIN (GLUCOPHAGE) 500 MG tablet Take 1 tablet (500 mg total) by mouth 2 (two) times daily with a meal.  . methotrexate 50 MG/2ML injection Inject 0.8 mLs (20 mg total) into the skin See admin instructions. Inject 0.8 mls (  20 mg) subcutaneously every 14 days as needed for severe pain (use with Humira injection)  . metoprolol tartrate (LOPRESSOR) 50 MG tablet Take 1 tablet (50 mg total) by mouth 2 (two) times daily. Please schedule appointment for refills.  . Tdap (BOOSTRIX) 5-2.5-18.5 LF-MCG/0.5 injection Inject 0.5 mLs into the muscle once for 1 dose.   No facility-administered encounter medications on file as of 01/16/2020.    Activities of Daily Living In your present state of health, do you have any difficulty performing the following activities: 01/16/2020 09/17/2019  Hearing? N N  Vision? N N  Difficulty concentrating or making decisions? N N  Walking or climbing stairs? Y N  Comment has had falls -  Dressing or bathing? N N  Doing errands, shopping? N N  Preparing Food and eating ? N -  Using the Toilet? N -  In the past six months, have you accidently leaked urine? Y -  Do you have problems with loss of bowel control? N -  Managing your Medications? N -  Managing your Finances? N -  Housekeeping or managing your Housekeeping? N -  Some recent data might be hidden    Patient Care Team: Minette Brine, FNP as PCP - General ([place]) Daneen Schick as Social Worker [place], Claudette Stapler, RN as Case Manager    Assessment:   This is a routine wellness examination for Djuana.  Exercise Activities and Dietary recommendations Current Exercise Habits: The patient does not participate in regular exercise at present  Goals    . "I would like to learn more about Diabetes" (pt-stated)     Current Barriers:  [name] Knowledge Deficits related to disease process and Self Health Management of Diabetes  Nurse Case Manager Clinical Goal(s):  [name] Over the next 30 days, patient will verbalize basic understanding of Diabetes disease process and self health management plan as evidenced by patient will review printed educational materials mailed by [facility] and will verbalize having a basic understanding of this disease process and how to improve and or maintain current A1C.   Interventions:   Completed initial CCM RN Telephone Follow Up with patient to establish plan of care . Evaluation of current treatment plan related to prediabetes and patient's adherence to plan as established by provider. . Provided education to patient re: current A1C; discussed target A1C . Reviewed medications with patient and discussed importance of taking medications exactly as prescribed w/o missed doses for best effectiveness . Discussed plans with patient for ongoing care management follow up and provided patient with direct contact information for care management team . Provided patient with printed  educational materials related to diabetes disease process; meal planning using the plate method; know your A1C; Signs and Symptoms of Hypo/Hyperglycemia   . Provided contact # for [facility] and discussed hours of nurse availability  . Scheduled a CCM follow up call with patient in about 2 weeks  Patient Self Care Activities:  . Self administers medications as prescribed . Attends all scheduled provider appointments . Calls pharmacy for medication refills . Attends  church or other social activities . Performs ADL's independently . Performs IADL's independently . Calls provider office for new concerns or questions  Initial goal documentation  l    . Assist with Chronic Disease Management and Care Coordination      Current Barriers:  [name] Knowledge Barriers related to resources and support available to address needs related to Chronic Disease Management and Care Coordination Services  Case Manager Clinical Goal(s):  .  Over the next 30 days, patient will work with the CCM team to address needs related to Chronic Disease Management and [employer].   Interventions:  . Collaborated with BSW and initiated plan of care to address needs related to Advanced Directives and patient specified chronic disease management.   Patient Self Care Activities:  . Self administers medications as prescribed . Attends all scheduled provider appointments . Calls provider office for new concerns or questions  Initial goal documentation     . Patient Stated     01/16/2020, wants to lose as much weight as possible     . Weight (lb) < 200 lb (90.7 kg) (pt-stated)       Fall Risk Fall Risk  01/16/2020 09/27/2019 04/12/2019 01/10/2019 01/10/2019  Falls in the past year? 1 0 1 1 0  Comment slides down the stairs - - - -  Number falls in past yr: 1 - 0 0 -  Comment - - - went to stand up and leg gave out -  Injury with Fall? 0 - 0 0 -  Risk for fall due to : History of fall(s);Medication side effect - - History of fall(s) -  Follow up Falls evaluation completed;Education provided;Falls prevention discussed - - Falls prevention discussed;Education provided -   Is the patient's home free of loose throw rugs in walkways, pet beds, electrical cords, etc?   yes      Grab bars in the bathroom? no      Handrails on the stairs?   yes      Adequate lighting?   yes  Timed Get Up and Go performed: n/a  Depression Screen PHQ 2/9 Scores 01/16/2020 09/27/2019 04/12/2019  02/23/2019  PHQ - 2 Score 1 0 0 0  PHQ- 9 Score - - - -     Cognitive Function     6CIT Screen 01/16/2020 01/10/2019  What Year? 0 points 0 points  What month? 0 points 0 points  What time? 0 points 0 points  Count back from 20 0 points 0 points  Months in reverse 2 points 4 points  Repeat phrase 0 points 0 points  Total Score 2 4    Immunization History  Administered Date(s) Administered  . Influenza,inj,Quad PF,6+ Mos 09/18/2019  . Influenza-Unspecified 07/18/2014  . Td 11/11/2008    Qualifies for Shingles Vaccine? n/a  Screening Tests Health Maintenance  Topic Date Due  . PAP SMEAR-Modifier  02/09/2010  . TETANUS/TDAP  11/11/2018  . URINE MICROALBUMIN  01/15/2021  . INFLUENZA VACCINE  Completed  . HIV Screening  Completed    Cancer Screenings: Lung: Low Dose CT Chest recommended if Age 32-80 years, 30 pack-year currently smoking OR have quit w/in 15years. Patient does not qualify. Breast:  Up to date on Mammogram? Yes   Up to date of Bone Density/Dexa? n/a Colorectal: n/a  Additional Screenings: : Hepatitis C Screening: n/a     Plan:    Patient wants to lose as much weight as possible.   I have personally reviewed and noted the following in the patient's chart:   . Medical and social history . Use of alcohol, tobacco or illicit drugs  . Current medications and supplements . Functional ability and status . Nutritional status . Physical activity . Advanced directives . List of other physicians . Hospitalizations, surgeries, and ER visits in previous 12 months . Vitals . Screenings to include cognitive, depression, and falls . Referrals and appointments  In addition, I have reviewed and discussed  with patient certain preventive protocols, quality metrics, and best practice recommendations. A written personalized care plan for preventive services as well as general preventive health recommendations were provided to patient.     Kellie Simmering,  LPN  [id number]

## 2020-01-16 NOTE — Patient Instructions (Signed)
Shelby Robertson , Thank you for taking time to come for your Medicare Wellness Visit. I appreciate your ongoing commitment to your health goals. Please review the following plan we discussed and let me know if I can assist you in the future.   Screening recommendations/referrals: Colonoscopy: n/a Mammogram: 11/2017 Bone Density: n/a Recommended yearly ophthalmology/optometry visit for glaucoma screening and checkup Recommended yearly dental visit for hygiene and checkup  Vaccinations: Influenza vaccine: 09/2019 Pneumococcal vaccine: n/a Tdap vaccine: sent to pharmacy Shingles vaccine: n/a    Advanced directives: Advance directive discussed with you today. I have provided a copy for you to complete at home and have notarized. Once this is complete please bring a copy in to our office so we can scan it into your chart.   Conditions/risks identified: obesity  Next appointment: 01/22/2021 at 10:00  Preventive Care 40-64 Years, Female Preventive care refers to lifestyle choices and visits with your health care provider that can promote health and wellness. What does preventive care include?  A yearly physical exam. This is also called an annual well check.  Dental exams once or twice a year.  Routine eye exams. Ask your health care provider how often you should have your eyes checked.  Personal lifestyle choices, including:  Daily care of your teeth and gums.  Regular physical activity.  Eating a healthy diet.  Avoiding tobacco and drug use.  Limiting alcohol use.  Practicing safe sex.  Taking low-dose aspirin daily starting at age 47.  Taking vitamin and mineral supplements as recommended by your health care provider. What happens during an annual well check? The services and screenings done by your health care provider during your annual well check will depend on your age, overall health, lifestyle risk factors, and family history of disease. Counseling  Your health care  provider may ask you questions about your:  Alcohol use.  Tobacco use.  Drug use.  Emotional well-being.  Home and relationship well-being.  Sexual activity.  Eating habits.  Work and work Statistician.  Method of birth control.  Menstrual cycle.  Pregnancy history. Screening  You may have the following tests or measurements:  Height, weight, and BMI.  Blood pressure.  Lipid and cholesterol levels. These may be checked every 5 years, or more frequently if you are over 6 years old.  Skin check.  Lung cancer screening. You may have this screening every year starting at age 47 if you have a 30-pack-year history of smoking and currently smoke or have quit within the past 15 years.  Fecal occult blood test (FOBT) of the stool. You may have this test every year starting at age 47.  Flexible sigmoidoscopy or colonoscopy. You may have a sigmoidoscopy every 5 years or a colonoscopy every 10 years starting at age 47.  Hepatitis C blood test.  Hepatitis B blood test.  Sexually transmitted disease (STD) testing.  Diabetes screening. This is done by checking your blood sugar (glucose) after you have not eaten for a while (fasting). You may have this done every 1-3 years.  Mammogram. This may be done every 1-2 years. Talk to your health care provider about when you should start having regular mammograms. This may depend on whether you have a family history of breast cancer.  BRCA-related cancer screening. This may be done if you have a family history of breast, ovarian, tubal, or peritoneal cancers.  Pelvic exam and Pap test. This may be done every 3 years starting at age 47. Starting at age  30, this may be done every 5 years if you have a Pap test in combination with an HPV test.  Bone density scan. This is done to screen for osteoporosis. You may have this scan if you are at high risk for osteoporosis. Discuss your test results, treatment options, and if necessary, the need  for more tests with your health care provider. Vaccines  Your health care provider may recommend certain vaccines, such as:  Influenza vaccine. This is recommended every year.  Tetanus, diphtheria, and acellular pertussis (Tdap, Td) vaccine. You may need a Td booster every 10 years.  Zoster vaccine. You may need this after age 47.  Pneumococcal 13-valent conjugate (PCV13) vaccine. You may need this if you have certain conditions and were not previously vaccinated.  Pneumococcal polysaccharide (PPSV23) vaccine. You may need one or two doses if you smoke cigarettes or if you have certain conditions. Talk to your health care provider about which screenings and vaccines you need and how often you need them. This information is not intended to replace advice given to you by your health care provider. Make sure you discuss any questions you have with your health care provider. Document Released: 10/31/2015 Document Revised: 06/23/2016 Document Reviewed: 08/05/2015 Elsevier Interactive Patient Education  2017 [place] Prevention in the Home Falls can cause injuries. They can happen to people of all ages. There are many things you can do to make your home safe and to help prevent falls. What can I do on the outside of my home?  Regularly fix the edges of walkways and driveways and fix any cracks.  Remove anything that might make you trip as you walk through a door, such as a raised step or threshold.  Trim any bushes or trees on the path to your home.  Use bright outdoor lighting.  Clear any walking paths of anything that might make someone trip, such as rocks or tools.  Regularly check to see if handrails are loose or broken. Make sure that both sides of any steps have handrails.  Any raised decks and porches should have guardrails on the edges.  Have any leaves, snow, or ice cleared regularly.  Use sand or salt on walking paths during winter.  Clean up any spills in  your garage right away. This includes oil or grease spills. What can I do in the bathroom?  Use night lights.  Install grab bars by the toilet and in the tub and shower. Do not use towel bars as grab bars.  Use non-skid mats or decals in the tub or shower.  If you need to sit down in the shower, use a plastic, non-slip stool.  Keep the floor dry. Clean up any water that spills on the floor as soon as it happens.  Remove soap buildup in the tub or shower regularly.  Attach bath mats securely with double-sided non-slip rug tape.  Do not have throw rugs and other things on the floor that can make you trip. What can I do in the bedroom?  Use night lights.  Make sure that you have a light by your bed that is easy to reach.  Do not use any sheets or blankets that are too big for your bed. They should not hang down onto the floor.  Have a firm chair that has side arms. You can use this for support while you get dressed.  Do not have throw rugs and other things on the floor that can  make you trip. What can I do in the kitchen?  Clean up any spills right away.  Avoid walking on wet floors.  Keep items that you use a lot in easy-to-reach places.  If you need to reach something above you, use a strong step stool that has a grab bar.  Keep electrical cords out of the way.  Do not use floor polish or wax that makes floors slippery. If you must use wax, use non-skid floor wax.  Do not have throw rugs and other things on the floor that can make you trip. What can I do with my stairs?  Do not leave any items on the stairs.  Make sure that there are handrails on both sides of the stairs and use them. Fix handrails that are broken or loose. Make sure that handrails are as long as the stairways.  Check any carpeting to make sure that it is firmly attached to the stairs. Fix any carpet that is loose or worn.  Avoid having throw rugs at the top or bottom of the stairs. If you do have  throw rugs, attach them to the floor with carpet tape.  Make sure that you have a light switch at the top of the stairs and the bottom of the stairs. If you do not have them, ask someone to add them for you. What else can I do to help prevent falls?  Wear shoes that:  Do not have high heels.  Have rubber bottoms.  Are comfortable and fit you well.  Are closed at the toe. Do not wear sandals.  If you use a stepladder:  Make sure that it is fully opened. Do not climb a closed stepladder.  Make sure that both sides of the stepladder are locked into place.  Ask someone to hold it for you, if possible.  Clearly mark and make sure that you can see:  Any grab bars or handrails.  First and last steps.  Where the edge of each step is.  Use tools that help you move around (mobility aids) if they are needed. These include:  Canes.  Walkers.  Scooters.  Crutches.  Turn on the lights when you go into a dark area. Replace any light bulbs as soon as they burn out.  Set up your furniture so you have a clear path. Avoid moving your furniture around.  If any of your floors are uneven, fix them.  If there are any pets around you, be aware of where they are.  Review your medicines with your doctor. Some medicines can make you feel dizzy. This can increase your chance of falling. Ask your doctor what other things that you can do to help prevent falls. This information is not intended to replace advice given to you by your health care provider. Make sure you discuss any questions you have with your health care provider. Document Released: 07/31/2009 Document Revised: 03/11/2016 Document Reviewed: 11/08/2014 Elsevier Interactive Patient Education  2017 [employer].

## 2020-01-17 ENCOUNTER — Other Ambulatory Visit: Payer: Self-pay

## 2020-01-17 DIAGNOSIS — Z23 Encounter for immunization: Secondary | ICD-10-CM

## 2020-01-17 LAB — LIPID PANEL
Chol/HDL Ratio: 5.4 ratio — ABNORMAL HIGH (ref 0.0–4.4)
Cholesterol, Total: 206 mg/dL — ABNORMAL HIGH (ref 100–199)
HDL: 38 mg/dL — ABNORMAL LOW (ref >39)
LDL Chol Calc (NIH): 135 mg/dL — ABNORMAL HIGH (ref 0–99)
Triglycerides: 182 mg/dL — ABNORMAL HIGH (ref 0–149)
VLDL Cholesterol Cal: 33 mg/dL (ref 5–40)

## 2020-01-17 LAB — CMP14+EGFR
ALT: 11 IU/L (ref 0–32)
AST: 14 IU/L (ref 0–40)
Albumin/Globulin Ratio: 1.1 — ABNORMAL LOW (ref 1.2–2.2)
Albumin: 4 g/dL (ref 3.8–4.8)
Alkaline Phosphatase: 91 IU/L (ref 39–117)
BUN/Creatinine Ratio: 17 (ref 9–23)
BUN: 10 mg/dL (ref 6–24)
Bilirubin Total: <0.2 mg/dL (ref 0.0–1.2)
CO2: 23 mmol/L (ref 20–29)
Calcium: 9.2 mg/dL (ref 8.7–10.2)
Chloride: 101 mmol/L (ref 96–106)
Creatinine, Ser: 0.58 mg/dL (ref 0.57–1.00)
GFR calc Af Amer: 127 mL/min/{1.73_m2} (ref >59)
GFR calc non Af Amer: 110 mL/min/{1.73_m2} (ref >59)
Globulin, Total: 3.6 g/dL (ref 1.5–4.5)
Glucose: 124 mg/dL — ABNORMAL HIGH (ref 65–99)
Potassium: 4.2 mmol/L (ref 3.5–5.2)
Sodium: 138 mmol/L (ref 134–144)
Total Protein: 7.6 g/dL (ref 6.0–8.5)

## 2020-01-17 LAB — HEMOGLOBIN A1C
Est. average glucose Bld gHb Est-mCnc: 140 mg/dL
Hgb A1c MFr Bld: 6.5 % — ABNORMAL HIGH (ref 4.8–5.6)

## 2020-01-17 MED ORDER — TETANUS-DIPHTH-ACELL PERTUSSIS 5-2.5-18.5 LF-MCG/0.5 IM SUSP: 0.5000 mL | Freq: Once | INTRAMUSCULAR | 0 refills | Status: AC

## 2020-01-17 MED ORDER — BLOOD GLUCOSE MONITOR KIT: PACK | 0 refills | Status: AC

## 2020-01-23 ENCOUNTER — Other Ambulatory Visit: Payer: Self-pay | Admitting: Rheumatology

## 2020-01-23 NOTE — Telephone Encounter (Signed)
ok 

## 2020-01-23 NOTE — Telephone Encounter (Signed)
Last Visit: 10/02/2019 telemedicine  Next Visit: 02/07/20 Labs: CBC on 09/18/19 and CMP on 01/16/20 TB Gold: 04/30/2019 negative   Patient to come to the office to update CBC 01/24/20.   Okay to refill 30 day supply Humira?

## 2020-01-24 ENCOUNTER — Other Ambulatory Visit: Payer: Self-pay

## 2020-01-24 DIAGNOSIS — Z79899 Other long term (current) drug therapy: Secondary | ICD-10-CM

## 2020-01-24 LAB — CBC WITH DIFFERENTIAL/PLATELET
Absolute Monocytes: 428 cells/uL (ref 200–950)
Basophils Absolute: 53 cells/uL (ref 0–200)
Basophils Relative: 0.7 %
Eosinophils Absolute: 128 cells/uL (ref 15–500)
Eosinophils Relative: 1.7 %
HCT: 39.2 % (ref 35.0–45.0)
Hemoglobin: 13.2 g/dL (ref 11.7–15.5)
Lymphs Abs: 2828 cells/uL (ref 850–3900)
MCH: 29.7 pg (ref 27.0–33.0)
MCHC: 33.7 g/dL (ref 32.0–36.0)
MCV: 88.3 fL (ref 80.0–100.0)
MPV: 9.4 fL (ref 7.5–12.5)
Monocytes Relative: 5.7 %
Neutro Abs: 4065 cells/uL (ref 1500–7800)
Neutrophils Relative %: 54.2 %
Platelets: 345 10*3/uL (ref 140–400)
RBC: 4.44 10*6/uL (ref 3.80–5.10)
RDW: 14.1 % (ref 11.0–15.0)
Total Lymphocyte: 37.7 %
WBC: 7.5 10*3/uL (ref 3.8–10.8)

## 2020-01-30 MED FILL — HUMIRA PEN 40 MG/0.8ML PNKT: 40 | 28 days supply | Qty: 2 | Fill #0

## 2020-01-30 NOTE — Progress Notes (Signed)
Office Visit Note  Patient: Shelby Robertson             Date of Birth: 12-03-1970           MRN: [id number]             PCP: Minette Brine, FNP Referring: Minette Brine, FNP Visit Date: 02/07/2020 Occupation: @GUAROCC @  Subjective:  Right knee joint pain   History of Present Illness: Joellyn Zoltowski is a 49 y.o. female with history of seronegative rheumatoid arthritis, osteoarthritis, and fibromyalgia.  Patient is on Humira 40 mg subcutaneous injections every 14 days, methotrexate 0.8 mL once weekly, and folic acid 2 mg daily.  She has not missed any doses of Humira or methotrexate recently.  She continues to have pain and inflammation in the right knee joint.  She would like a cortisone injection today.  She continues to have stiffness and intermittent inflammation in the left hand.  Overall she states that Humira and methotrexate have been effective and she does not want to make any medication changes at this time.  Activities of Daily Living:  Patient reports morning stiffness for 1 hour.   Patient Denies nocturnal pain.  Difficulty dressing/grooming: Denies Difficulty climbing stairs: Reports Difficulty getting out of chair: Denies Difficulty using hands for taps, buttons, cutlery, and/or writing: Denies  Review of Systems  Constitutional: Positive for fatigue.  HENT: Positive for mouth dryness. Negative for mouth sores and nose dryness.   Eyes: Negative for pain, visual disturbance and dryness.  Respiratory: Negative for cough, hemoptysis, shortness of breath and difficulty breathing.   Cardiovascular: Positive for swelling in legs/feet. Negative for chest pain, palpitations and hypertension.  Gastrointestinal: Negative for blood in stool, constipation and diarrhea.  Endocrine: Negative for increased urination.  Genitourinary: Negative for difficulty urinating and painful urination.  Musculoskeletal: Positive for joint swelling, muscle weakness, morning stiffness and muscle  tenderness. Negative for arthralgias, joint pain, myalgias and myalgias.  Skin: Negative for color change, pallor, rash, hair loss, nodules/bumps, skin tightness, ulcers and sensitivity to sunlight.  Allergic/Immunologic: Negative for susceptible to infections.  Neurological: Negative for dizziness, numbness, headaches and weakness.  Hematological: Negative for bruising/bleeding tendency and swollen glands.  Psychiatric/Behavioral: Positive for sleep disturbance. Negative for depressed mood. The patient is not nervous/anxious.     PMFS History:  Patient Active Problem List   Diagnosis Date Noted  . Sepsis ([place]) 09/17/2019  . AMS (altered mental status) 09/17/2019  . Morbid obesity ([place]) 01/10/2019  . Prediabetes 01/10/2019  . Tobacco use disorder, continuous 01/10/2019  . Soft tissue swelling of back 01/10/2019  . Fibromyalgia 07/27/2017  . Other fatigue 07/27/2017  . History of hidradenitis suppurativa 07/27/2017  . History of hypertension 07/27/2017  . History of hyperlipidemia 07/27/2017  . Smoker 07/27/2017  . High risk medication use 08/21/2016  . Rheumatoid arthritis of multiple sites without rheumatoid factor ([place]) 08/12/2016  . ARTHRITIS, GENERALIZED 07/14/2010  . HYPERTENSION, BENIGN ESSENTIAL 03/04/2009  . POLYURIA 03/04/2009  . FIBROMYALGIA 11/09/2007  . HIDRADENITIS SUPPURATIVA 07/11/2007  . Mixed hyperlipidemia 06/28/2007  . LEG EDEMA, BILATERAL 05/05/2007    Past Medical History:  Diagnosis Date  . Fibromyalgia   . Fibromyalgia   . Hypertension   . Osteoarthritis   . Rheumatoid arthritis(714.0)   . Rheumatoid arthritis(714.0)     Family History  Problem Relation Age of Onset  . Diabetes Mother   . Healthy Father   . Heart attack Maternal Grandmother   . Stroke Maternal Grandmother  History reviewed. No pertinent surgical history. Social History   Social History Narrative  . Not on file   Immunization History  Administered Date(s) Administered    . Influenza,inj,Quad PF,6+ Mos 09/18/2019  . Influenza-Unspecified 07/18/2014  . Td 11/11/2008     Objective: Vital Signs: BP 131/86 (BP Location: Left Arm, Patient Position: Sitting, Cuff Size: Normal)   Pulse 99   Resp 20   Ht 5\' 6"  (1.676 m)   Wt 298 lb (135.2 kg)   BMI 48.10 kg/m    Physical Exam Vitals and nursing note reviewed.  Constitutional:      Appearance: She is well-developed.  HENT:     Head: Normocephalic and atraumatic.  Eyes:     Conjunctiva/sclera: Conjunctivae normal.  Pulmonary:     Effort: Pulmonary effort is normal.  Abdominal:     General: Bowel sounds are normal.     Palpations: Abdomen is soft.  Musculoskeletal:     Cervical back: Normal range of motion.  Lymphadenopathy:     Cervical: No cervical adenopathy.  Skin:    General: Skin is warm and dry.     Capillary Refill: Capillary refill takes less than 2 seconds.  Neurological:     Mental Status: She is alert and oriented to person, place, and time.  Psychiatric:        Behavior: Behavior normal.      Musculoskeletal Exam: C-spine, thoracic spine, and lumbar spine good ROM.  Shoulder joints, elbow joints, wrist joints good ROM with no synovitis.  Ulnar deviation of left 3rd, 4th and 5th MCP joint.  Hip joints good ROM with no discomfort.  Right knee has painful ROM.  Left knee has good ROM with no discomfort.  No warmth or effusion of knee joints noted.  Ankle joints good ROM with no discomfort.   CDAI Exam: CDAI Score: 0.4  Patient Global: 2 mm; Provider Global: 2 mm Swollen: 0 ; Tender: 0  Joint Exam 02/07/2020   No joint exam has been documented for this visit   There is currently no information documented on the homunculus. Go to the Rheumatology activity and complete the homunculus joint exam.  Investigation: No additional findings.  Imaging: No results found.  Recent Labs: Lab Results  Component Value Date   WBC 7.5 01/24/2020   HGB 13.2 01/24/2020   PLT 345 01/24/2020    NA 138 01/16/2020   K 4.2 01/16/2020   CL 101 01/16/2020   CO2 23 01/16/2020   GLUCOSE 124 (H) 01/16/2020   BUN 10 01/16/2020   CREATININE 0.58 01/16/2020   BILITOT <0.2 01/16/2020   ALKPHOS 91 01/16/2020   AST 14 01/16/2020   ALT 11 01/16/2020   PROT 7.6 01/16/2020   ALBUMIN 4.0 01/16/2020   CALCIUM 9.2 01/16/2020   GFRAA 127 01/16/2020   QFTBGOLDPLUS NEGATIVE 04/30/2019    Speciality Comments: No specialty comments available.  Procedures:  Large Joint Inj: R knee on 02/07/2020 2:46 PM Indications: pain Details: 27 G 1.5 in needle, medial approach  Arthrogram: No  Medications: 1.5 mL lidocaine 1 %; 40 mg triamcinolone acetonide 40 MG/ML Aspirate: 0 mL Outcome: tolerated well, no immediate complications Procedure, treatment alternatives, risks and benefits explained, specific risks discussed. Consent was given by the patient. Immediately prior to procedure a time out was called to verify the correct patient, procedure, equipment, support staff and site/side marked as required. Patient was prepped and draped in the usual sterile fashion.     Allergies: Patient has no known  allergies.   Assessment / Plan:     Visit Diagnoses: Rheumatoid arthritis of multiple sites without rheumatoid factor [facility]): She presents today with acute on chronic right knee joint pain.  She has painful range of motion of the right knee on exam.  No warmth or effusion was noted.  She requested a right knee joint cortisone injection.  She continues to experience intermittent stiffness and inflammation in the left hand.  She does not currently have any synovitis.  She has ulnar deviation of the left third fourth, and fifth MCP joints.  Overall she is clinically been doing well on Humira 40 mg subcutaneous injections every 14 days, methotrexate 0.8 mL once weekly, folic acid 2 mg by mouth daily.  She has not missed any doses of these medications recently.  She will continue on the current treatment regimen.  A  refill of folic acid was sent to the pharmacy today.  She was advised to notify us if she develops increased joint pain or joint swelling.  She will follow-up in the office in 5 months.  High risk medication use - Humira 40 mg sq once every 14 days, MTX 0.8 ml sq once weekly, folic acid 2 mg daily.  CBC within normal limits on 01/24/2020.  CMP on 01/16/2020 TB gold - 04/30/2019.  Future order for TB gold was placed today.  Standing orders for CBC and CMP are in place..- Plan: QuantiFERON-TB Gold Plus  Fibromyalgia: She continues to experience intermittent myalgias and muscle tenderness due to fibromyalgia.  Overall her discomfort has been tolerable recently.  She continues to have chronic fatigue secondary to insomnia.  We discussed the importance of regular exercise and good sleep hygiene.   Trapezius muscle spasm: She experiences intermittent trapezius muscle spasms bilaterally.  Other fatigue: Chronic and secondary to insomnia.  Primary osteoarthritis of both knees: Chronic pain.  She presents today with acute on chronic pain of the right knee joint.  She has painful range of motion of the right knee on exam.  She states at times she has difficulty bearing weight due to the discomfort.  She has no warmth or effusion on exam today.  She requested a right knee joint cortisone injection.  Shoulder the procedure well.  The procedure was completed above.  Aftercare was discussed.  Other medical conditions are listed as follows:   History of hidradenitis suppurativa  History of hyperlipidemia  History of hypertension    Orders: Orders Placed This Encounter  Procedures  . Large Joint Inj  . QuantiFERON-TB Gold Plus   Meds ordered this encounter  Medications  . folic acid (FOLVITE) 1 MG tablet    Sig: Take 2 tablets (2 mg total) by mouth daily.    Dispense:  180 tablet    Refill:  3  . methotrexate 50 MG/2ML injection    Sig: Inject 0.8 mls (20 mg total) into the skin weekly.    Dispense:   2 mL     Follow-Up Instructions: Return in about 5 months (around 07/09/2020) for Rheumatoid arthritis, Osteoarthritis, Fibromyalgia.   Leonia Reader  I examined and evaluated the patient with Hazel Sams PA.  She is doing well on current regimen.  I do not see synovitis on examination.  He has been having ongoing pain in her right knee joint.  Per patient's request right knee joint was injected with cortisone as described below.  She tolerated the procedure well.  The plan of care was discussed as noted above.  Bo Merino,  MD  Note - This record has been created using Editor, commissioning.  Chart creation errors have been sought, but may not always  have been located. Such creation errors do not reflect on  the standard of medical care.

## 2020-02-07 ENCOUNTER — Encounter: Payer: Self-pay | Admitting: Rheumatology

## 2020-02-07 ENCOUNTER — Ambulatory Visit (INDEPENDENT_AMBULATORY_CARE_PROVIDER_SITE_OTHER): Payer: Medicare Other | Admitting: Rheumatology

## 2020-02-07 ENCOUNTER — Other Ambulatory Visit: Payer: Self-pay

## 2020-02-07 VITALS — BP 131/86 | HR 99 | Resp 20 | Ht 66.0 in | Wt 298.0 lb

## 2020-02-07 DIAGNOSIS — Z872 Personal history of diseases of the skin and subcutaneous tissue: Secondary | ICD-10-CM

## 2020-02-07 DIAGNOSIS — M797 Fibromyalgia: Secondary | ICD-10-CM | POA: Diagnosis not present

## 2020-02-07 DIAGNOSIS — M25561 Pain in right knee: Secondary | ICD-10-CM

## 2020-02-07 DIAGNOSIS — Z8639 Personal history of other endocrine, nutritional and metabolic disease: Secondary | ICD-10-CM

## 2020-02-07 DIAGNOSIS — Z79899 Other long term (current) drug therapy: Secondary | ICD-10-CM

## 2020-02-07 DIAGNOSIS — Z8679 Personal history of other diseases of the circulatory system: Secondary | ICD-10-CM

## 2020-02-07 DIAGNOSIS — G8929 Other chronic pain: Secondary | ICD-10-CM

## 2020-02-07 DIAGNOSIS — R5383 Other fatigue: Secondary | ICD-10-CM | POA: Diagnosis not present

## 2020-02-07 DIAGNOSIS — M0609 Rheumatoid arthritis without rheumatoid factor, multiple sites: Secondary | ICD-10-CM

## 2020-02-07 DIAGNOSIS — M62838 Other muscle spasm: Secondary | ICD-10-CM

## 2020-02-07 DIAGNOSIS — M170 Bilateral primary osteoarthritis of knee: Secondary | ICD-10-CM

## 2020-02-07 LAB — HEMOGLOBIN A1C: Hemoglobin A1C: 6.6

## 2020-02-07 MED ORDER — FOLIC ACID 1 MG PO TABS: 2.0000 mg | ORAL_TABLET | Freq: Every day | ORAL | 3 refills | Status: DC

## 2020-02-07 MED ORDER — METHOTREXATE SODIUM CHEMO INJECTION 50 MG/2ML: INTRAMUSCULAR | Status: DC

## 2020-02-07 NOTE — Patient Instructions (Signed)
Standing Labs We placed an order today for your standing lab work.    Please come back and get your standing labs in July and every 3 months  We have open lab daily Monday through Thursday from 8:30-12:30 PM and 1:30-4:30 PM and Friday from 8:30-12:30 PM and 1:30-4:00 PM at the office of Dr. Shaili Deveshwar.   You may experience shorter wait times on Monday and Friday afternoons. The office is located at [address], [address], [place], [place] [address] No appointment is necessary.   Labs are drawn by [facility].  You may receive a bill from [facility] for your lab work.  If you wish to have your labs drawn at another location, please call the office 24 hours in advance to send orders.  If you have any questions regarding directions or hours of operation,  please call [phone].   Just as a reminder please drink plenty of water prior to coming for your lab work. Thanks!  

## 2020-02-11 ENCOUNTER — Ambulatory Visit: Payer: Medicare Other | Attending: Internal Medicine

## 2020-02-11 ENCOUNTER — Other Ambulatory Visit: Payer: Self-pay | Admitting: Nurse Practitioner

## 2020-02-11 DIAGNOSIS — Z23 Encounter for immunization: Secondary | ICD-10-CM

## 2020-02-11 NOTE — Progress Notes (Signed)
   Covid-19 Vaccination Clinic  Name:  Shelby Robertson    MRN: [id number] DOB: 12/02/1970  02/11/2020  Ms. Shelby Robertson was observed post Covid-19 immunization for 15 minutes without incident. She was provided with Vaccine Information Sheet and instruction to access the V-Safe system.   Ms. Shelby Robertson was instructed to call 911 with any severe reactions post vaccine: [name] Difficulty breathing  . Swelling of face and throat  . A fast heartbeat  . A bad rash all over body  . Dizziness and weakness   Immunizations Administered    Name Date Dose VIS Date Route   Pfizer COVID-19 Vaccine 02/11/2020  4:39 PM 0.3 mL 12/12/2018 Intramuscular   Manufacturer: [place]   Lot: [id number]   [place]: [id number]

## 2020-02-14 NOTE — Progress Notes (Signed)
This visit occurred during the SARS-CoV-2 public health emergency.  Safety protocols were in place, including screening questions prior to the visit, additional usage of staff PPE, and extensive cleaning of exam room while observing appropriate contact time as indicated for disinfecting solutions.  Subjective:     Patient ID: Shelby Robertson , female    DOB: 12-10-1970 , 49 y.o.   MRN: [id number]   Chief Complaint  Patient presents with  . Prediabetes    patient presents today for a 3 month f/u    HPI  She is doing okay since her last visit.   Wt Readings from Last 3 Encounters: 01/16/20 : (!) 301 lb 2.4 oz (136.6 kg) 01/16/20 : (!) 301 lb 3.2 oz (136.6 kg) 09/27/19 : 288 lb (130.6 kg)   Diabetes She presents for her follow-up diabetic visit. Diabetes type: prediabetes. Her disease course has been stable. There are no hypoglycemic associated symptoms. Pertinent negatives for hypoglycemia include no dizziness or headaches. There are no diabetic associated symptoms. Pertinent negatives for diabetes include no chest pain and no fatigue. There are no hypoglycemic complications. There are no diabetic complications. Risk factors for coronary artery disease include obesity. Current diabetic treatment includes oral agent (monotherapy). She is compliant with treatment all of the time. When asked about meal planning, she reported none. She has not had a previous visit with a dietitian. She rarely participates in exercise. (She does not check her blood sugars) An ACE inhibitor/angiotensin II receptor blocker is being taken. She does not see a podiatrist.Eye exam is not current (she is having a mobile eye exam next week).  Rash This is a recurrent problem. The current episode started more than 1 month ago. Location: forehead and behind bilateral ears. The rash is characterized by scaling, itchiness and dryness. Pertinent negatives include no anorexia, congestion, fatigue or fever. Past treatments include  cold compress.     Past Medical History:  Diagnosis Date  . Fibromyalgia   . Fibromyalgia   . Hypertension   . Osteoarthritis   . Rheumatoid arthritis(714.0)   . Rheumatoid arthritis(714.0)      Family History  Problem Relation Age of Onset  . Diabetes Mother   . Healthy Father   . Heart attack Maternal Grandmother   . Stroke Maternal Grandmother      Current Outpatient Medications:  .  acetaminophen (TYLENOL) 650 MG CR tablet, Take 1,300 mg by mouth every 8 (eight) hours as needed for pain., Disp: , Rfl:  .  albuterol (PROVENTIL) (2.5 MG/3ML) 0.083% nebulizer solution, Take 3 mLs (2.5 mg total) by nebulization every 6 (six) hours as needed for wheezing or shortness of breath., Disp: 75 mL, Rfl: 12 .  albuterol (VENTOLIN HFA) 108 (90 Base) MCG/ACT inhaler, Inhale 2 puffs into the lungs every 4 (four) hours as needed for wheezing or shortness of breath (cough, shortness of breath or wheezing.)., Disp: 6.7 g, Rfl: 4 .  amLODipine (NORVASC) 10 MG tablet, Take 1 tablet (10 mg total) by mouth daily., Disp: 90 tablet, Rfl: 1 .  aspirin 325 MG tablet, Take 325 mg by mouth every 6 (six) hours as needed (pain)., Disp: , Rfl:  .  B-D TB SYRINGE 1CC/27GX1/2" 27G X 1/2" 1 ML MISC, USE 1 SYRINGE TO INJECT METHOTREXATE INTO THE SKIN ONCE A WEEK., Disp: 12 each, Rfl: 3 .  diclofenac sodium (VOLTAREN) 1 % GEL, Apply 2 grams to 4 grams topically to affected area up to 4 times daily PRN (Patient taking differently: Apply  2-4 g topically 4 (four) times daily as needed (pain). ), Disp: 4 Tube, Rfl: 2 .  diphenhydrAMINE (BENADRYL) 25 MG tablet, Take 25 mg by mouth every 6 (six) hours as needed for itching, allergies or sleep., Disp: , Rfl:  .  DULoxetine (CYMBALTA) 60 MG capsule, Take 60 mg by mouth daily.  , Disp: , Rfl:  .  ezetimibe (ZETIA) 10 MG tablet, Take 1 tablet (10 mg total) by mouth daily., Disp: 90 tablet, Rfl: 1 .  folic acid (FOLVITE) 1 MG tablet, Take 2 tablets (2 mg total) by mouth  daily., Disp: 180 tablet, Rfl: 1 .  HUMIRA PEN 40 MG/0.8ML PNKT, INJECT 1 PEN INTO THE SKIN EVERY 14 DAYS, Disp: 3 each, Rfl: 0 .  hydrochlorothiazide (HYDRODIURIL) 12.5 MG tablet, Take 1 tablet (12.5 mg total) by mouth daily as needed. (Patient taking differently: Take 12.5 mg by mouth daily as needed (fluid/edema). ), Disp: 30 tablet, Rfl: 2 .  HYDROcodone-acetaminophen (NORCO) 10-325 MG tablet, Take 1 tablet by mouth every 6 (six) hours., Disp: , Rfl:  .  metFORMIN (GLUCOPHAGE) 500 MG tablet, Take 1 tablet (500 mg total) by mouth 2 (two) times daily with a meal., Disp:  , Rfl:  .  methotrexate 50 MG/2ML injection, Inject 0.8 mLs (20 mg total) into the skin See admin instructions. Inject 0.8 mls (20 mg) subcutaneously every 14 days as needed for severe pain (use with Humira injection), Disp: , Rfl:  .  metoprolol tartrate (LOPRESSOR) 50 MG tablet, Take 1 tablet (50 mg total) by mouth 2 (two) times daily. Please schedule appointment for refills., Disp: 180 tablet, Rfl: 1 .  Tdap (BOOSTRIX) 5-2.5-18.5 LF-MCG/0.5 injection, Inject 0.5 mLs into the muscle once for 1 dose., Disp: 0.5 mL, Rfl: 0   No Known Allergies   Review of Systems  Constitutional: Negative.  Negative for fatigue and fever.  HENT: Negative for congestion.   Respiratory: Negative.   Cardiovascular: Negative.  Negative for chest pain, palpitations and leg swelling.  Gastrointestinal: Negative for anorexia.  Skin: Positive for rash.  Neurological: Negative for dizziness and headaches.  Psychiatric/Behavioral: Negative.      Today's Vitals   01/16/20 1425  BP: (!) 142/82  Pulse: 95  Temp: 98 F (36.7 C)  TempSrc: Oral  Weight: (!) 301 lb 2.4 oz (136.6 kg)  Height: 5' 6"  (1.676 m)   Body mass index is 48.61 kg/m.   Objective:  Physical Exam Constitutional:      Appearance: Normal appearance.  Cardiovascular:     Rate and Rhythm: Normal rate and regular rhythm.     Pulses: Normal pulses.     Heart sounds: Normal  heart sounds. No murmur.  Pulmonary:     Effort: Pulmonary effort is normal. No respiratory distress.     Breath sounds: Normal breath sounds.  Skin:    Capillary Refill: Capillary refill takes less than 2 seconds.  Neurological:     General: No focal deficit present.     Mental Status: She is alert and oriented to person, place, and time.     Cranial Nerves: No cranial nerve deficit.  Psychiatric:        Mood and Affect: Mood normal.        Behavior: Behavior normal.        Thought Content: Thought content normal.        Judgment: Judgment normal.         Assessment And Plan:     1. Essential hypertension  Chronic, fair control  Continue with current medications and continue to avoid high salt foods - CMP14+EGFR  2. Mixed hyperlipidemia  Chronic, tolerating zetia well  Will check lipid panel - Lipid panel  3. Prediabetes  Chronic, stable  Continue to focus on healthy diet low in sugar and starches - Hemoglobin A1c  4. Rheumatoid arthritis of multiple sites without rheumatoid factor (HCC)  Chronic, she is to follow up with Dr. Bronson Curb  5. Morbid obesity ([place])  She has gained approximately 10 lbs since December.   Encouraged to increase her physical activity  6. Rash and nonspecific skin eruption  Dry scaly rash to forehead and behind both ears  - mometasone (ELOCON) 0.1 % cream; Apply to affected area daily  Dispense: 45 g; Refill: [place], FNP

## 2020-02-25 ENCOUNTER — Encounter: Payer: Self-pay | Admitting: Nurse Practitioner

## 2020-03-03 ENCOUNTER — Ambulatory Visit: Payer: Medicare Other

## 2020-03-03 ENCOUNTER — Other Ambulatory Visit: Payer: Self-pay | Admitting: Rheumatology

## 2020-03-03 NOTE — Telephone Encounter (Signed)
Last Visit: 02/07/2020 Next Visit: 07/10/2020 Labs: 01/16/2020 Glucose 124 Albumin/Globulin Ratio 1.1 CBC 01/24/2020 WNL TB Gold: 04/30/2019 Neg   Current Dose per office note 02/07/2020: Humira 40 mg sq once every 14 days  Okay to refill per Dr. Estanislado Pandy

## 2020-03-18 MED FILL — HUMIRA PEN 40 MG/0.8ML PNKT: 40 | 28 days supply | Qty: 2 | Fill #0

## 2020-03-22 IMAGING — CT CT HEAD W/O CM
3 of 4 series · 14 of 47 positions shown, 16 images · non-contrast
Comparison: September 06, 2016

CLINICAL DATA: Head trauma, headache for 2 days

EXAM:
CT HEAD WITHOUT CONTRAST
TECHNIQUE: Contiguous axial images were obtained from the base of the skull
through the vertex without intravenous contrast.

[Series 4: head 3.0 mpr cor · coronal · 0.34mm/px · 3 of 77 slices shown]
[im 26/77  brain]
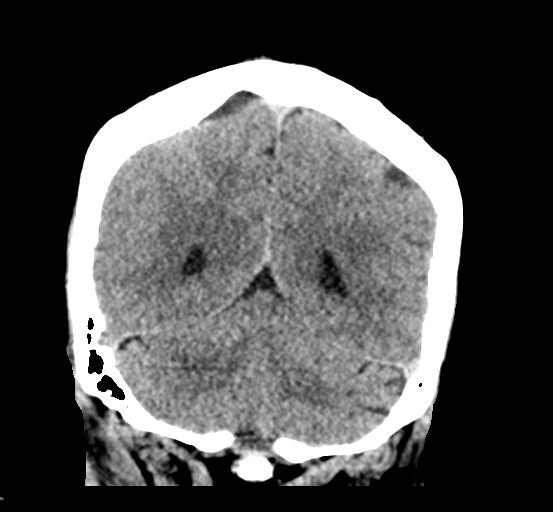
[im 34/77  brain]
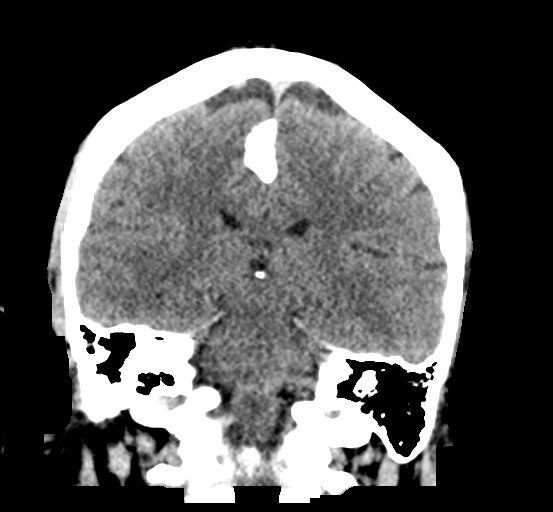
[im 43/77  brain]
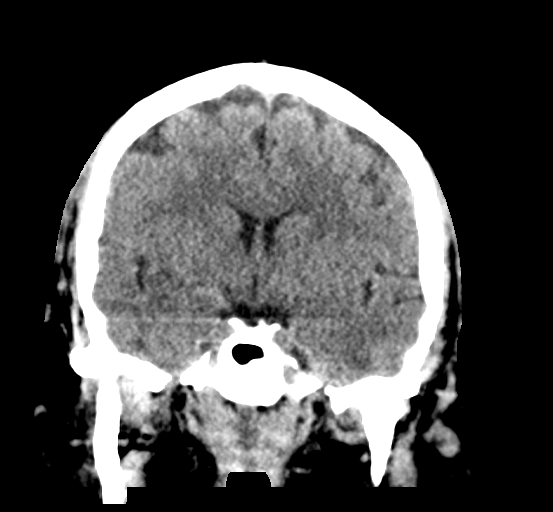

[Series 5: head 2.0 h70h · axial · 0.46mm/px · z∈[+1171,+1309]mm · 8 of 87 slices shown, 10 images]
[im 9/87  brain]
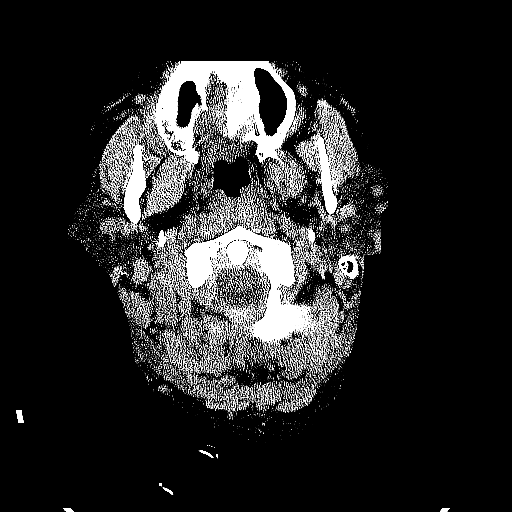
[im 9/87  bone]
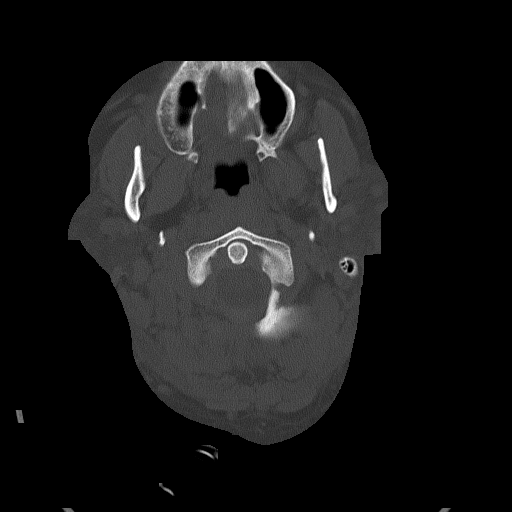
[im 18/87  brain]
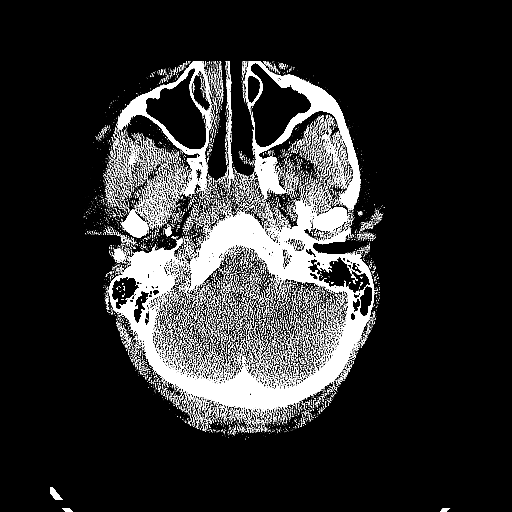
[im 26/87  brain]
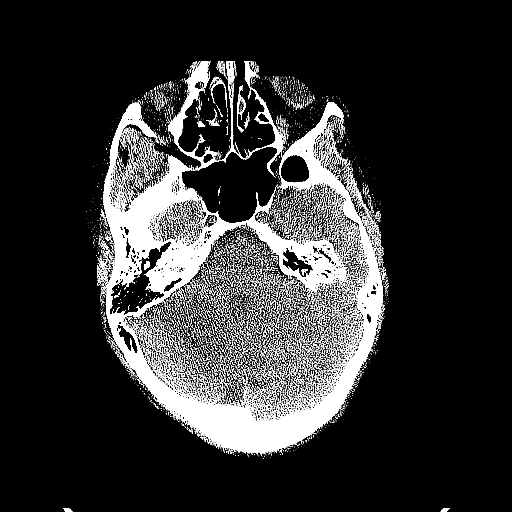
[im 39/87  brain]
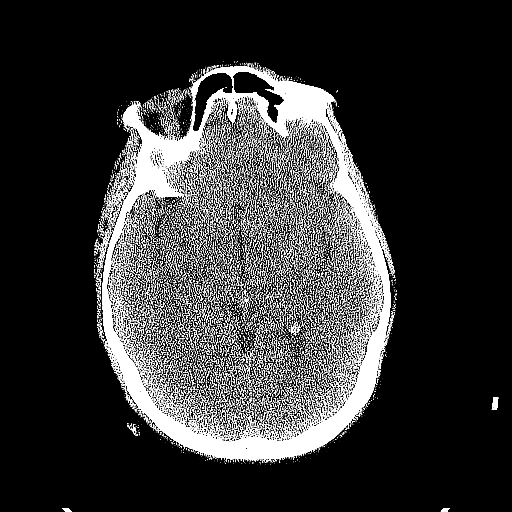
[im 48/87  brain]
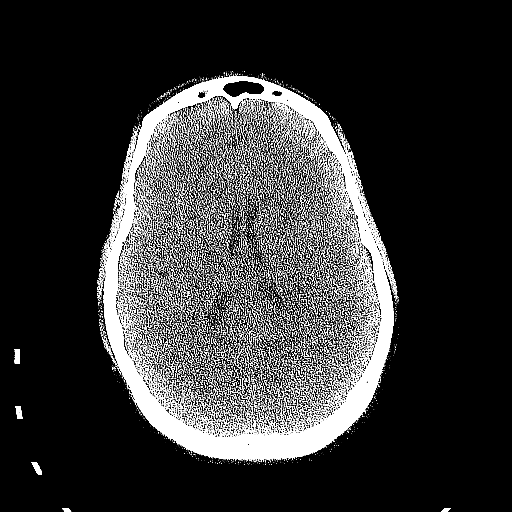
[im 48/87  bone]
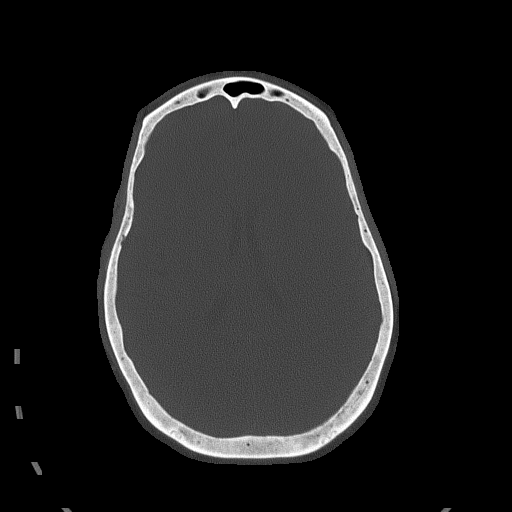
[im 61/87  brain]
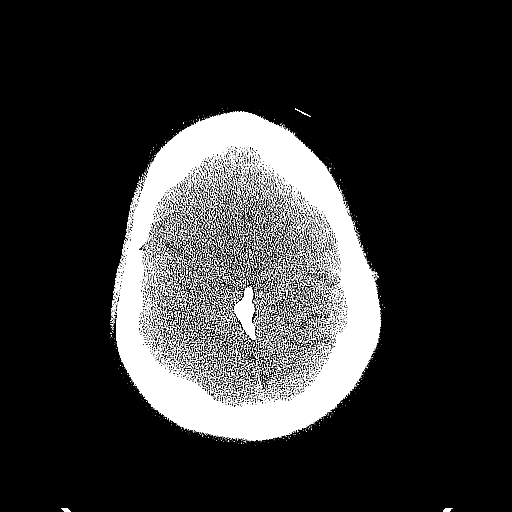
[im 69/87  brain]
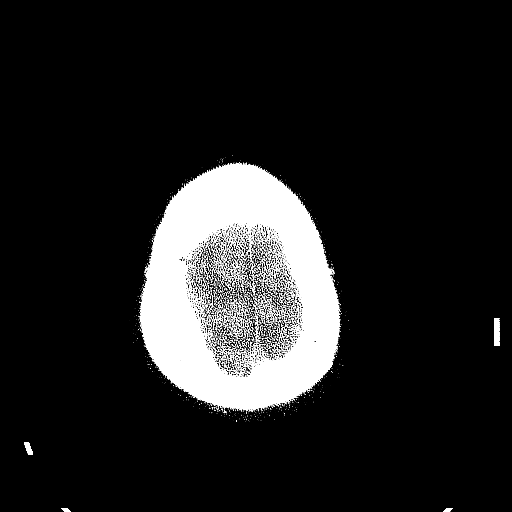
[im 78/87  brain]
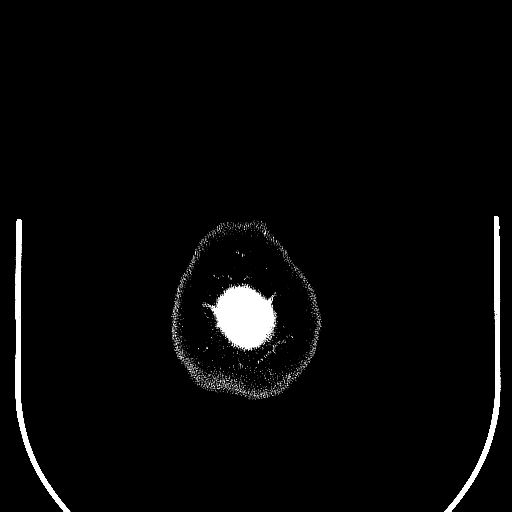

[Series 6: head 3.0 mpr sag · sagittal · 0.34mm/px · 3 of 66 slices shown]
[im 22/66  brain]
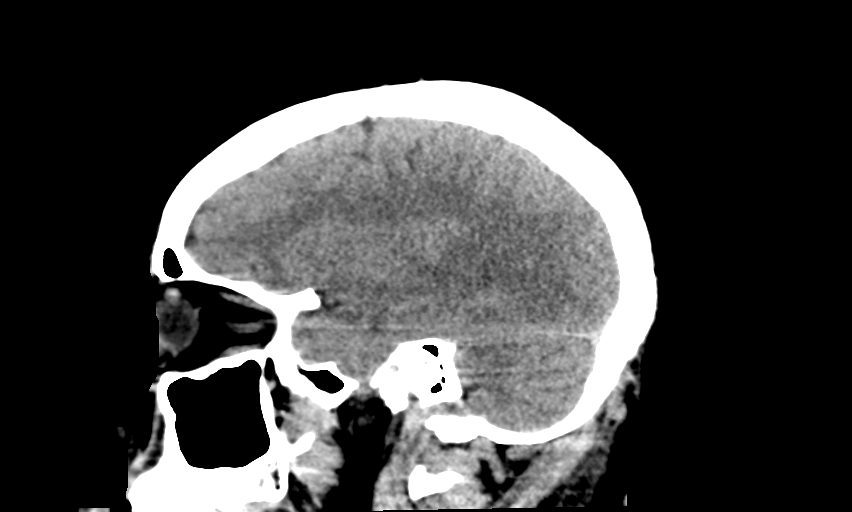
[im 33/66  brain]
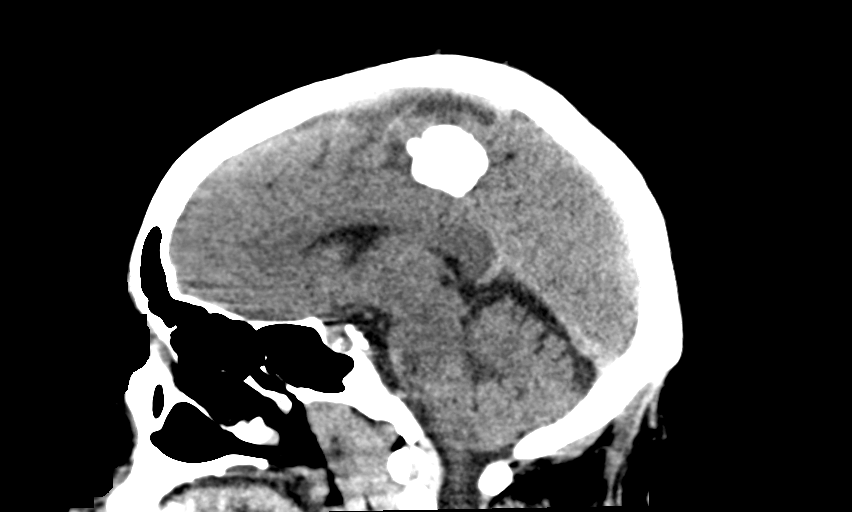
[im 44/66  brain]
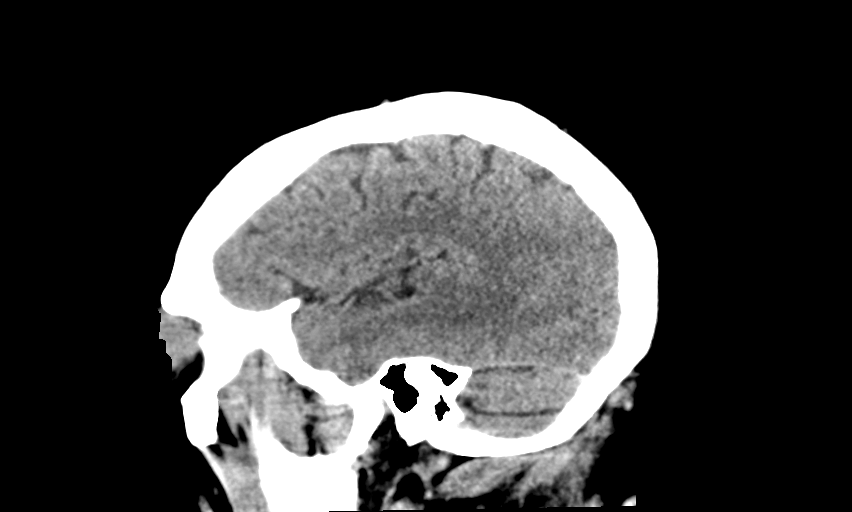

[14 of 47 positions shown; findings below may reference images not displayed]

FINDINGS: Brain: No evidence of acute territorial infarction, hemorrhage,
hydrocephalus,extra-axial collection or mass lesion/mass effect. The
ventricles and sulci are normal in appearance. Low-attenuation
changes in the deep white matter consistent with small vessel
ischemia.

Vascular: No hyperdense vessel or unexpected calcification.

Skull: The skull is intact. No fracture or focal lesion identified.

Sinuses/Orbits: The visualized paranasal sinuses and mastoid air
cells are clear. The orbits and globes intact.

Other: None
IMPRESSION: No acute intracranial abnormality.

Findings consistent with mild chronic small vessel ischemia

## 2020-03-22 IMAGING — DX DG CHEST 1V PORT
1 series · 1 of 1 positions shown · non-contrast
Comparison: Chest radiograph 05/18/2019

CLINICAL DATA: Evaluate for sepsis.

EXAM:
PORTABLE CHEST 1 VIEW

[chest]
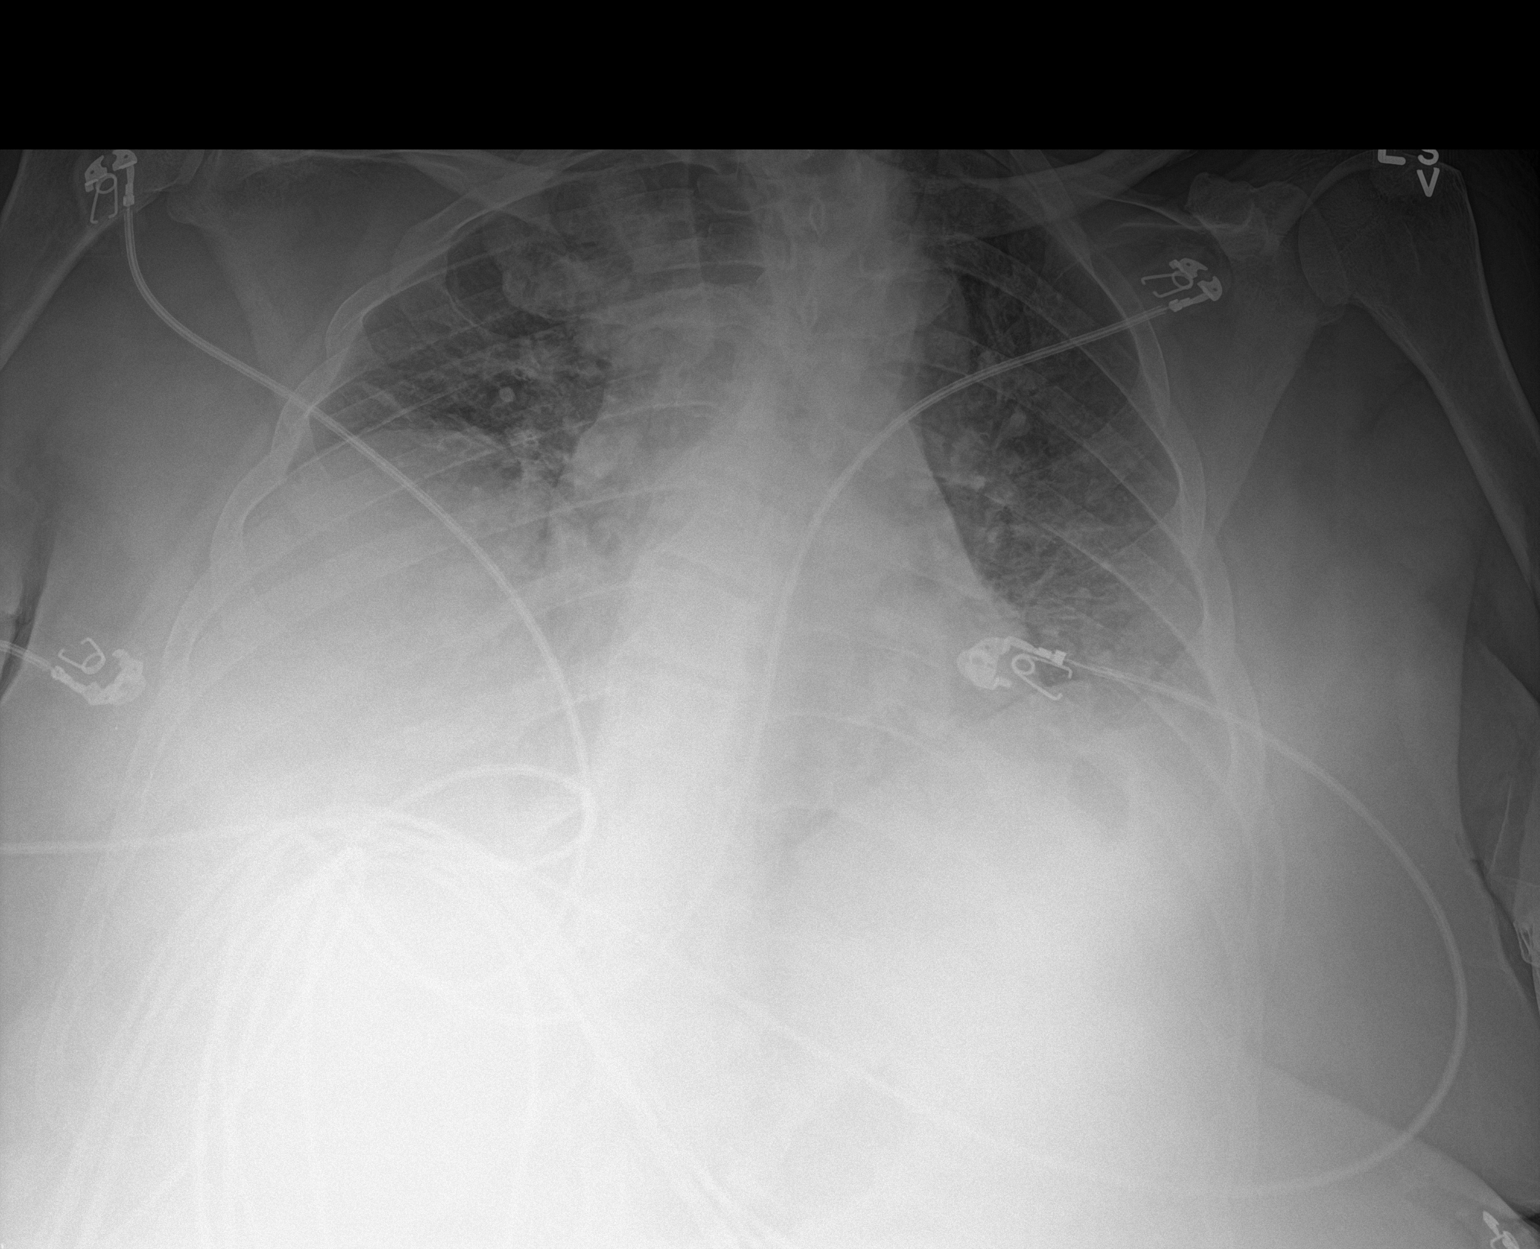

[1 of 1 positions shown; findings below may reference images not displayed]

FINDINGS: Monitoring leads overlie the patient. Low lung volumes. Stable
cardiac and mediastinal contours. Interval development of bilateral
interstitial pulmonary opacities. No pleural effusion or
pneumothorax.
IMPRESSION: Limited low volume portable exam.

Bilateral interstitial pulmonary opacities may represent edema in
the appropriate clinical setting.

## 2020-04-16 ENCOUNTER — Other Ambulatory Visit: Payer: Self-pay

## 2020-04-16 ENCOUNTER — Encounter: Payer: Self-pay | Admitting: Nurse Practitioner

## 2020-04-16 ENCOUNTER — Ambulatory Visit (INDEPENDENT_AMBULATORY_CARE_PROVIDER_SITE_OTHER): Payer: Medicare Other | Admitting: Nurse Practitioner

## 2020-04-16 VITALS — BP 162/102 | HR 98 | Temp 97.9°F | Ht 66.0 in | Wt 297.4 lb

## 2020-04-16 DIAGNOSIS — R7303 Prediabetes: Secondary | ICD-10-CM | POA: Diagnosis not present

## 2020-04-16 DIAGNOSIS — E782 Mixed hyperlipidemia: Secondary | ICD-10-CM | POA: Diagnosis not present

## 2020-04-16 DIAGNOSIS — R600 Localized edema: Secondary | ICD-10-CM

## 2020-04-16 DIAGNOSIS — I10 Essential (primary) hypertension: Secondary | ICD-10-CM | POA: Diagnosis not present

## 2020-04-16 DIAGNOSIS — Z1159 Encounter for screening for other viral diseases: Secondary | ICD-10-CM

## 2020-04-16 MED ORDER — METFORMIN HCL 500 MG PO TABS: 500.0000 mg | ORAL_TABLET | Freq: Two times a day (BID) | ORAL | Status: DC

## 2020-04-16 MED ORDER — AMLODIPINE BESYLATE 10 MG PO TABS: 10.0000 mg | ORAL_TABLET | Freq: Every day | ORAL | 1 refills | Status: DC

## 2020-04-16 MED ORDER — EZETIMIBE 10 MG PO TABS: 10.0000 mg | ORAL_TABLET | Freq: Every day | ORAL | 1 refills | Status: DC

## 2020-04-16 MED ORDER — METOPROLOL TARTRATE 50 MG PO TABS: 50.0000 mg | ORAL_TABLET | Freq: Two times a day (BID) | ORAL | 1 refills | Status: DC

## 2020-04-16 MED ORDER — HYDROCHLOROTHIAZIDE 12.5 MG PO TABS: 12.5000 mg | ORAL_TABLET | Freq: Every day | ORAL | 2 refills | Status: DC | PRN

## 2020-04-16 MED FILL — HUMIRA PEN 40 MG/0.8ML PNKT: 40 | 28 days supply | Qty: 2 | Fill #1

## 2020-04-16 NOTE — Progress Notes (Signed)
This visit occurred during the SARS-CoV-2 public health emergency.  Safety protocols were in place, including screening questions prior to the visit, additional usage of staff PPE, and extensive cleaning of exam room while observing appropriate contact time as indicated for disinfecting solutions.  Subjective:     Patient ID: Shelby Robertson , female    DOB: 03-29-1971 , 49 y.o.   MRN: [id number]   Chief Complaint  Patient presents with  . Hypertension    HPI  Hypertension This is a chronic problem. The current episode started more than 1 year ago. The problem is unchanged. The problem is uncontrolled. Pertinent negatives include no anxiety, chest pain, headaches, palpitations or shortness of breath. There are no associated agents to hypertension. Risk factors for coronary artery disease include obesity and sedentary lifestyle.     Past Medical History:  Diagnosis Date  . Fibromyalgia   . Fibromyalgia   . Hypertension   . Osteoarthritis   . Rheumatoid arthritis(714.0)   . Rheumatoid arthritis(714.0)      Family History  Problem Relation Age of Onset  . Diabetes Mother   . Healthy Father   . Heart attack Maternal Grandmother   . Stroke Maternal Grandmother      Current Outpatient Medications:  .  acetaminophen (TYLENOL) 650 MG CR tablet, Take 1,300 mg by mouth every 8 (eight) hours as needed for pain., Disp: , Rfl:  .  albuterol (PROVENTIL) (2.5 MG/3ML) 0.083% nebulizer solution, Take 3 mLs (2.5 mg total) by nebulization every 6 (six) hours as needed for wheezing or shortness of breath., Disp: 75 mL, Rfl: 12 .  albuterol (VENTOLIN HFA) 108 (90 Base) MCG/ACT inhaler, Inhale 2 puffs into the lungs every 4 (four) hours as needed for wheezing or shortness of breath (cough, shortness of breath or wheezing.)., Disp: 6.7 g, Rfl: 4 .  amLODipine (NORVASC) 10 MG tablet, Take 1 tablet (10 mg total) by mouth daily., Disp: 90 tablet, Rfl: 1 .  aspirin 325 MG tablet, Take 325 mg by mouth  every 6 (six) hours as needed (pain)., Disp: , Rfl:  .  B-D TB SYRINGE 1CC/27GX1/2" 27G X 1/2" 1 ML MISC, USE 1 SYRINGE TO INJECT METHOTREXATE INTO THE SKIN ONCE A WEEK., Disp: 12 each, Rfl: 3 .  blood glucose meter kit and supplies KIT, Dispense based on patient and insurance preference. Use up to two times daily as directed. (FOR ICD-9 250.00, 250.01)., Disp: 1 each, Rfl: 0 .  diclofenac sodium (VOLTAREN) 1 % GEL, Apply 2 grams to 4 grams topically to affected area up to 4 times daily PRN (Patient taking differently: Apply 2-4 g topically 4 (four) times daily as needed (pain). ), Disp: 4 Tube, Rfl: 2 .  diphenhydrAMINE (BENADRYL) 25 MG tablet, Take 25 mg by mouth every 6 (six) hours as needed for itching, allergies or sleep., Disp: , Rfl:  .  DULoxetine (CYMBALTA) 60 MG capsule, Take 60 mg by mouth daily.  , Disp: , Rfl:  .  ezetimibe (ZETIA) 10 MG tablet, Take 1 tablet (10 mg total) by mouth daily., Disp: 90 tablet, Rfl: 1 .  folic acid (FOLVITE) 1 MG tablet, Take 2 tablets (2 mg total) by mouth daily., Disp: 180 tablet, Rfl: 3 .  HUMIRA PEN 40 MG/0.8ML PNKT, INJECT 1 PEN INTO THE SKIN EVERY 14 DAYS, Disp: 6 each, Rfl: 0 .  hydrochlorothiazide (HYDRODIURIL) 12.5 MG tablet, Take 1 tablet (12.5 mg total) by mouth daily as needed. (Patient taking differently: Take 12.5 mg by mouth  daily as needed (fluid/edema). ), Disp: 30 tablet, Rfl: 2 .  HYDROcodone-acetaminophen (NORCO) 10-325 MG tablet, Take 1 tablet by mouth every 6 (six) hours., Disp: , Rfl:  .  Lancets (ONETOUCH DELICA PLUS [id number]) MISC, 2 (two) times daily. use for testing, Disp: , Rfl:  .  metFORMIN (GLUCOPHAGE) 500 MG tablet, Take 1 tablet (500 mg total) by mouth 2 (two) times daily with a meal., Disp:  , Rfl:  .  methotrexate 50 MG/2ML injection, Inject 0.8 mls (20 mg total) into the skin weekly., Disp: 2 mL, Rfl:  .  metoprolol tartrate (LOPRESSOR) 50 MG tablet, Take 1 tablet (50 mg total) by mouth 2 (two) times daily. Please schedule  appointment for refills., Disp: 180 tablet, Rfl: 1 .  mometasone (ELOCON) 0.1 % cream, Apply to affected area daily, Disp: 45 g, Rfl: 1 .  ONETOUCH VERIO test strip, TEST TWICE DAILY, Disp: 100 strip, Rfl: 3 .  tiZANidine (ZANAFLEX) 4 MG capsule, Take 4 mg by mouth every 6 (six) hours., Disp: , Rfl:    No Known Allergies   Review of Systems  Constitutional: Negative.  Negative for fatigue.  Eyes: Negative for visual disturbance.  Respiratory: Negative.  Negative for shortness of breath.   Cardiovascular: Negative.  Negative for chest pain, palpitations and leg swelling.  Gastrointestinal: Negative.   Endocrine: Negative.   Musculoskeletal: Negative.   Skin: Negative.   Neurological: Negative for dizziness, weakness and headaches.  Psychiatric/Behavioral: Negative for confusion. The patient is not nervous/anxious.      Today's Vitals   04/16/20 1542  BP: (!) 162/102  Pulse: 98  Temp: 97.9 F (36.6 C)  TempSrc: Oral  Weight: 297 lb 6.4 oz (134.9 kg)  Height: 5' 6"  (1.676 m)  PainSc: 0-No pain   Body mass index is 48 kg/m.   Objective:  Physical Exam Vitals reviewed.  Constitutional:      Appearance: She is well-developed.  HENT:     Head: Normocephalic and atraumatic.  Eyes:     Pupils: Pupils are equal, round, and reactive to light.  Cardiovascular:     Rate and Rhythm: Normal rate and regular rhythm.     Pulses: Normal pulses.     Heart sounds: Normal heart sounds. No murmur heard.   Pulmonary:     Effort: Pulmonary effort is normal.     Breath sounds: Normal breath sounds.  Musculoskeletal:        General: Normal range of motion.  Skin:    General: Skin is warm and dry.     Capillary Refill: Capillary refill takes less than 2 seconds.  Neurological:     General: No focal deficit present.     Mental Status: She is alert and oriented to person, place, and time.     Cranial Nerves: No cranial nerve deficit.  Psychiatric:        Mood and Affect: Mood normal.          Assessment And Plan:     1. Essential hypertension  Her blood pressure is elevated today, she feels is related to stressors prior to coming to the office  She is to check her blood pressure at home once she gets settled to see if it is better - amLODipine (NORVASC) 10 MG tablet; Take 1 tablet (10 mg total) by mouth daily.  Dispense: 90 tablet; Refill: 1 - metoprolol tartrate (LOPRESSOR) 50 MG tablet; Take 1 tablet (50 mg total) by mouth 2 (two) times daily. Please schedule appointment for  refills.  Dispense: 180 tablet; Refill: 1 - CMP14+EGFR  2. Mixed hyperlipidemia  Chronic, controlled  Continue with current medications, tolerating well  Advised to avoid fried and fatty foods. - ezetimibe (ZETIA) 10 MG tablet; Take 1 tablet (10 mg total) by mouth daily.  Dispense: 90 tablet; Refill: 1 - Lipid panel  3. Localized edema  Continues to have 1+ edema, nonpitting  She is advised to eat high potassium containing foods - hydrochlorothiazide (HYDRODIURIL) 12.5 MG tablet; Take 1 tablet (12.5 mg total) by mouth daily as needed (fluid/edema).  Dispense: 30 tablet; Refill: 2  4. Prediabetes  Chronic, stable  Continue with metformin  Discussed importance of healthy diet and exercise. - metFORMIN (GLUCOPHAGE) 500 MG tablet; Take 1 tablet (500 mg total) by mouth 2 (two) times daily with a meal. - Hemoglobin A1c - CMP14+EGFR  5. Encounter for hepatitis C screening test for low risk patient  Will check Hepatitis C screening due to recent recommendations to screen all adults 18 years and older - Hepatitis C antibody         Minette Brine, FNP    THE PATIENT IS ENCOURAGED TO PRACTICE SOCIAL DISTANCING DUE TO THE COVID-19 PANDEMIC.

## 2020-04-17 LAB — CMP14+EGFR
ALT: 19 IU/L (ref 0–32)
AST: 16 IU/L (ref 0–40)
Albumin/Globulin Ratio: 1 — ABNORMAL LOW (ref 1.2–2.2)
Albumin: 4 g/dL (ref 3.8–4.8)
Alkaline Phosphatase: 99 IU/L (ref 48–121)
BUN/Creatinine Ratio: 12 (ref 9–23)
BUN: 6 mg/dL (ref 6–24)
Bilirubin Total: 0.4 mg/dL (ref 0.0–1.2)
CO2: 25 mmol/L (ref 20–29)
Calcium: 9.8 mg/dL (ref 8.7–10.2)
Chloride: 101 mmol/L (ref 96–106)
Creatinine, Ser: 0.51 mg/dL — ABNORMAL LOW (ref 0.57–1.00)
GFR calc Af Amer: 132 mL/min/{1.73_m2} (ref >59)
GFR calc non Af Amer: 115 mL/min/{1.73_m2} (ref >59)
Globulin, Total: 4 g/dL (ref 1.5–4.5)
Glucose: 107 mg/dL — ABNORMAL HIGH (ref 65–99)
Potassium: 4.2 mmol/L (ref 3.5–5.2)
Sodium: 138 mmol/L (ref 134–144)
Total Protein: 8 g/dL (ref 6.0–8.5)

## 2020-04-17 LAB — LIPID PANEL
Chol/HDL Ratio: 5.6 ratio — ABNORMAL HIGH (ref 0.0–4.4)
Cholesterol, Total: 214 mg/dL — ABNORMAL HIGH (ref 100–199)
HDL: 38 mg/dL — ABNORMAL LOW (ref >39)
LDL Chol Calc (NIH): 143 mg/dL — ABNORMAL HIGH (ref 0–99)
Triglycerides: 180 mg/dL — ABNORMAL HIGH (ref 0–149)
VLDL Cholesterol Cal: 33 mg/dL (ref 5–40)

## 2020-04-17 LAB — HEPATITIS C ANTIBODY: Hep C Virus Ab: <0.1 s/co ratio (ref 0.0–0.9)

## 2020-04-17 LAB — HEMOGLOBIN A1C
Est. average glucose Bld gHb Est-mCnc: 128 mg/dL
Hgb A1c MFr Bld: 6.1 % — ABNORMAL HIGH (ref 4.8–5.6)

## 2020-04-23 ENCOUNTER — Other Ambulatory Visit: Payer: Self-pay | Admitting: Nurse Practitioner

## 2020-04-23 DIAGNOSIS — E782 Mixed hyperlipidemia: Secondary | ICD-10-CM

## 2020-04-23 MED ORDER — ATORVASTATIN CALCIUM 10 MG PO TABS
10.0000 mg | ORAL_TABLET | Freq: Every day | ORAL | 2 refills | Status: DC
Start: 2020-04-23 — End: 2020-09-29

## 2020-04-23 MED ORDER — EZETIMIBE 10 MG PO TABS: 10.0000 mg | ORAL_TABLET | Freq: Every day | ORAL | 1 refills | Status: DC

## 2020-05-04 ENCOUNTER — Encounter: Payer: Self-pay | Admitting: Nurse Practitioner

## 2020-05-13 ENCOUNTER — Other Ambulatory Visit: Payer: Self-pay

## 2020-05-13 ENCOUNTER — Emergency Department (HOSPITAL_COMMUNITY)
Admission: EM | Admit: 2020-05-13 | Discharge: 2020-05-13 | Discharge status: Against Medical Advice | Payer: Medicare Other

## 2020-05-13 NOTE — ED Triage Notes (Signed)
Pt has a rash on her scalp X3 months stating treatment from her PCP is not effective.  Pain is caused by her itching  As RN was triaging, pt decided to leave.

## 2020-05-15 ENCOUNTER — Other Ambulatory Visit: Payer: Self-pay

## 2020-05-15 ENCOUNTER — Encounter (HOSPITAL_COMMUNITY): Payer: Self-pay

## 2020-05-15 ENCOUNTER — Ambulatory Visit (HOSPITAL_COMMUNITY)
Admission: EM | Admit: 2020-05-15 | Discharge: 2020-05-15 | Disposition: A | Discharge status: Home / Self Care | Payer: Medicare Other | Attending: Internal Medicine | Admitting: Internal Medicine

## 2020-05-15 DIAGNOSIS — L219 Seborrheic dermatitis, unspecified: Secondary | ICD-10-CM

## 2020-05-15 MED ORDER — SELENIUM SULFIDE 2.5 % EX LOTN: 1.0000 "application " | TOPICAL_LOTION | Freq: Every day | CUTANEOUS | 2 refills | Status: DC | PRN

## 2020-05-15 MED ORDER — HYDROXYZINE HCL 25 MG PO TABS
25.0000 mg | ORAL_TABLET | Freq: Three times a day (TID) | ORAL | 0 refills | Status: AC | PRN
Start: 2020-05-15 — End: ?

## 2020-05-15 NOTE — ED Provider Notes (Signed)
[facility]    CSN: [id number] Arrival date & time: 05/15/20  1753      History   Chief Complaint Chief Complaint  Patient presents with  . Rash    HPI Shelby Robertson is a 49 y.o. female comes to the urgent care with complaints of worsening rash over the scalp and the neck as well as the forehead.  Patient symptoms have been worsening over the past 3 months.  She has tried shampoo with no improvement.  She denies any change in cosmetics or elevation.  Patient said the symptoms have become more bothersome over the past few days and hence the visit to the urgent care.  She has not seen a dermatologist in the past.  She has a history of rheumatoid arthritis on immunosuppressant agents.  No fever or chills.   HPI  Past Medical History:  Diagnosis Date  . Fibromyalgia   . Fibromyalgia   . Hypertension   . Osteoarthritis   . Rheumatoid arthritis(714.0)   . Rheumatoid arthritis(714.0)     Patient Active Problem List   Diagnosis Date Noted  . Sepsis ([place]) 09/17/2019  . AMS (altered mental status) 09/17/2019  . Morbid obesity ([place]) 01/10/2019  . Prediabetes 01/10/2019  . Tobacco use disorder, continuous 01/10/2019  . Soft tissue swelling of back 01/10/2019  . Fibromyalgia 07/27/2017  . Other fatigue 07/27/2017  . History of hidradenitis suppurativa 07/27/2017  . History of hypertension 07/27/2017  . History of hyperlipidemia 07/27/2017  . Smoker 07/27/2017  . High risk medication use 08/21/2016  . Rheumatoid arthritis of multiple sites without rheumatoid factor ([place]) 08/12/2016  . ARTHRITIS, GENERALIZED 07/14/2010  . HYPERTENSION, BENIGN ESSENTIAL 03/04/2009  . POLYURIA 03/04/2009  . FIBROMYALGIA 11/09/2007  . HIDRADENITIS SUPPURATIVA 07/11/2007  . Mixed hyperlipidemia 06/28/2007  . LEG EDEMA, BILATERAL 05/05/2007    History reviewed. No pertinent surgical history.  OB History   No obstetric history on file.      Home Medications    Prior to  Admission medications   Medication Sig Start Date End Date Taking? Authorizing Provider  acetaminophen (TYLENOL) 650 MG CR tablet Take 1,300 mg by mouth every 8 (eight) hours as needed for pain.    [provider]  albuterol (PROVENTIL) (2.5 MG/3ML) 0.083% nebulizer solution Take 3 mLs (2.5 mg total) by nebulization every 6 (six) hours as needed for wheezing or shortness of breath. 10/11/19   Robyn Haber, MD  albuterol (VENTOLIN HFA) 108 (90 Base) MCG/ACT inhaler Inhale 2 puffs into the lungs every 4 (four) hours as needed for wheezing or shortness of breath (cough, shortness of breath or wheezing.). 10/11/19   Robyn Haber, MD  amLODipine (NORVASC) 10 MG tablet Take 1 tablet (10 mg total) by mouth daily. 04/16/20   Minette Brine, FNP  aspirin 325 MG tablet Take 325 mg by mouth every 6 (six) hours as needed (pain).    [provider]  atorvastatin (LIPITOR) 10 MG tablet Take 1 tablet (10 mg total) by mouth at bedtime. 04/23/20 04/23/21  Minette Brine, FNP  B-D TB SYRINGE 1CC/27GX1/2" 27G X 1/2" 1 ML MISC USE 1 SYRINGE TO INJECT METHOTREXATE INTO THE SKIN ONCE A WEEK. 07/23/19   Bo Merino, MD  blood glucose meter kit and supplies KIT Dispense based on patient and insurance preference. Use up to two times daily as directed. (FOR ICD-9 250.00, 250.01). 01/17/20   Minette Brine, FNP  diclofenac sodium (VOLTAREN) 1 % GEL Apply 2 grams to 4 grams topically to  affected area up to 4 times daily PRN Patient taking differently: Apply 2-4 g topically 4 (four) times daily as needed (pain).  01/05/19   Ofilia Neas, PA-C  diphenhydrAMINE (BENADRYL) 25 MG tablet Take 25 mg by mouth every 6 (six) hours as needed for itching, allergies or sleep.    [provider]  DULoxetine (CYMBALTA) 60 MG capsule Take 60 mg by mouth daily.      [provider]  ezetimibe (ZETIA) 10 MG tablet Take 1 tablet (10 mg total) by mouth daily. 04/23/20   Minette Brine, FNP  folic acid (FOLVITE)  1 MG tablet Take 2 tablets (2 mg total) by mouth daily. 02/07/20   Ofilia Neas, PA-C  HUMIRA PEN 40 MG/0.8ML PNKT INJECT 1 PEN INTO THE SKIN EVERY 14 DAYS 03/03/20   Bo Merino, MD  hydrochlorothiazide (HYDRODIURIL) 12.5 MG tablet Take 1 tablet (12.5 mg total) by mouth daily as needed (fluid/edema). 04/16/20   Minette Brine, FNP  HYDROcodone-acetaminophen (NORCO) 10-325 MG tablet Take 1 tablet by mouth every 6 (six) hours. 08/20/19   [provider]  hydrOXYzine (ATARAX/VISTARIL) 25 MG tablet Take 1 tablet (25 mg total) by mouth every 8 (eight) hours as needed for itching. 05/15/20   Pollie Poma, Myrene Galas, MD  Lancets (ONETOUCH DELICA PLUS [id number]) MISC 2 (two) times daily. use for testing 01/17/20   [provider]  metFORMIN (GLUCOPHAGE) 500 MG tablet Take 1 tablet (500 mg total) by mouth 2 (two) times daily with a meal. 04/16/20   Minette Brine, FNP  methotrexate 50 MG/2ML injection Inject 0.8 mls (20 mg total) into the skin weekly. 02/07/20   Ofilia Neas, PA-C  metoprolol tartrate (LOPRESSOR) 50 MG tablet Take 1 tablet (50 mg total) by mouth 2 (two) times daily. Please schedule appointment for refills. 04/16/20   Minette Brine, FNP  mometasone (ELOCON) 0.1 % cream Apply to affected area daily 01/16/20 01/15/21  Minette Brine, FNP  [facility] VERIO test strip TEST TWICE DAILY 02/11/20   Minette Brine, FNP  selenium sulfide (SELSUN) 2.5 % shampoo Apply 1 application topically daily as needed for irritation or itching. 05/15/20   Demitrious Mccannon, Myrene Galas, MD  tiZANidine (ZANAFLEX) 4 MG capsule Take 4 mg by mouth every 6 (six) hours. 10/15/19   [provider]    Family History Family History  Problem Relation Age of Onset  . Diabetes Mother   . Healthy Father   . Heart attack Maternal Grandmother   . Stroke Maternal Grandmother     Social History Social History   Tobacco Use  . Smoking status: Current Every Day Smoker    Packs/day: 0.50    Years: 20.00    Pack years:  10.00    Types: Cigarettes  . Smokeless tobacco: Never Used  Vaping Use  . Vaping Use: Never used  Substance Use Topics  . Alcohol use: Not Currently  . Drug use: Not Currently    Types: Hydrocodone     Allergies   Patient has no known allergies.   Review of Systems Review of Systems  Constitutional: Negative.   HENT: Negative.   Respiratory: Negative.   Musculoskeletal: Negative.   Skin: Negative for color change, rash and wound.  Neurological: Negative.      Physical Exam Triage Vital Signs ED Triage Vitals  Enc Vitals Group     BP 05/15/20 1831 (!) 143/91     Pulse Rate 05/15/20 1831 103     Resp 05/15/20 1831 17  Temp 05/15/20 1831 98.2 F (36.8 C)     Temp Source 05/15/20 1831 Oral     SpO2 05/15/20 1831 100 %     Weight --      Height --      Head Circumference --      Peak Flow --      Pain Score 05/15/20 1830 0     Pain Loc --      Pain Edu? --      Excl. in [place]? --    No data found.  Updated Vital Signs BP (!) 143/91 (BP Location: Right Arm)   Pulse 103   Temp 98.2 F (36.8 C) (Oral)   Resp 17   SpO2 100%   Visual Acuity Right Eye Distance:   Left Eye Distance:   Bilateral Distance:    Right Eye Near:   Left Eye Near:    Bilateral Near:     Physical Exam Vitals and nursing note reviewed.  Constitutional:      General: She is not in acute distress.    Appearance: Normal appearance. She is not ill-appearing.  Cardiovascular:     Rate and Rhythm: Normal rate and regular rhythm.  Pulmonary:     Effort: Pulmonary effort is normal.  Abdominal:     General: Bowel sounds are normal.     Palpations: Abdomen is soft.     Tenderness: There is no abdominal tenderness. There is no guarding or rebound.  Musculoskeletal:        General: No swelling, tenderness or deformity. Normal range of motion.  Skin:    General: Skin is warm.     Capillary Refill: Capillary refill takes less than 2 seconds.     Coloration: Skin is not jaundiced.      Findings: Erythema and rash present. No bruising.     Comments: Hyperpigmented, scaly rash over the posterior aspect of the neck as well as the forehead.  Dandruff noted on the scalp.  Neurological:     Mental Status: She is alert.      UC Treatments / Results  Labs (all labs ordered are listed, but only abnormal results are displayed) Labs Reviewed - No data to display  EKG   Radiology No results found.  Procedures Procedures (including critical care time)  Medications Ordered in UC Medications - No data to display  Initial Impression / Assessment and Plan / UC Course  I have reviewed the triage vital signs and the nursing notes.  Pertinent labs & imaging results that were available during my care of the patient were reviewed by me and considered in my medical decision making (see chart for details).     1.  Seborrheic dermatitis of the scalp: Hydroxyzine 25 mg orally every 8 hours as needed for itching Selenium sulfide 2.5% shampoo to be applied topically daily as needed Dermatology referral given to the patient Patient will call and make the dermatology appointment Return precautions given. Final Clinical Impressions(s) / UC Diagnoses   Final diagnoses:  Seborrheic dermatitis of scalp   Discharge Instructions   None    ED Prescriptions    Medication Sig Dispense Auth. Provider   hydrOXYzine (ATARAX/VISTARIL) 25 MG tablet Take 1 tablet (25 mg total) by mouth every 8 (eight) hours as needed for itching. 30 tablet Aleyza Salmi, Myrene Galas, MD   selenium sulfide (SELSUN) 2.5 % shampoo Apply 1 application topically daily as needed for irritation or itching. 118 mL Rhilee Currin, Myrene Galas, MD  PDMP not reviewed this encounter.   Chase Picket, MD 05/15/20 2001

## 2020-05-15 NOTE — ED Triage Notes (Signed)
Pt presents with ongoing chronic rash in scalp, on neck, and forehead X 3 months; pt states she was diagnosed with eczema by PCP and was prescribed a cream that has not given her any relief, pt states she feels like the rash has worsened.

## 2020-05-27 ENCOUNTER — Telehealth: Payer: Medicare Other

## 2020-05-28 MED FILL — HUMIRA PEN 40 MG/0.8ML PNKT: 40 | 28 days supply | Qty: 2 | Fill #2

## 2020-05-29 DIAGNOSIS — L209 Atopic dermatitis, unspecified: Secondary | ICD-10-CM | POA: Insufficient documentation

## 2020-06-04 ENCOUNTER — Telehealth: Payer: Self-pay

## 2020-06-04 NOTE — Telephone Encounter (Signed)
Pt called to check on forms. Per JM ask pt what are the forms for? LVM for her to call the office

## 2020-06-17 ENCOUNTER — Telehealth: Payer: Self-pay

## 2020-06-17 NOTE — Telephone Encounter (Signed)
I called patient to notify her that her forms have been completed and she can come pick them up. YL,RMA

## 2020-06-25 ENCOUNTER — Other Ambulatory Visit: Payer: Self-pay | Admitting: Rheumatology

## 2020-07-02 ENCOUNTER — Other Ambulatory Visit: Payer: Self-pay

## 2020-07-02 ENCOUNTER — Telehealth: Payer: Medicare Other

## 2020-07-02 ENCOUNTER — Ambulatory Visit: Payer: Self-pay

## 2020-07-02 DIAGNOSIS — I10 Essential (primary) hypertension: Secondary | ICD-10-CM

## 2020-07-02 DIAGNOSIS — M0609 Rheumatoid arthritis without rheumatoid factor, multiple sites: Secondary | ICD-10-CM

## 2020-07-02 DIAGNOSIS — E782 Mixed hyperlipidemia: Secondary | ICD-10-CM

## 2020-07-02 DIAGNOSIS — R7303 Prediabetes: Secondary | ICD-10-CM

## 2020-07-04 NOTE — Progress Notes (Deleted)
Office Visit Note  Patient: Shelby Robertson             Date of Birth: 02/20/1971           MRN: [id number]             PCP: Minette Brine, FNP Referring: Minette Brine, FNP Visit Date: 07/10/2020 Occupation: @GUAROCC @  Subjective:  No chief complaint on file.   History of Present Illness: Shelby Robertson is a 49 y.o. female ***   Activities of Daily Living:  Patient reports morning stiffness for *** {minute/hour:19697}.   Patient {ACTIONS;DENIES/REPORTS:21021675::"Denies"} nocturnal pain.  Difficulty dressing/grooming: {ACTIONS;DENIES/REPORTS:21021675::"Denies"} Difficulty climbing stairs: {ACTIONS;DENIES/REPORTS:21021675::"Denies"} Difficulty getting out of chair: {ACTIONS;DENIES/REPORTS:21021675::"Denies"} Difficulty using hands for taps, buttons, cutlery, and/or writing: {ACTIONS;DENIES/REPORTS:21021675::"Denies"}  No Rheumatology ROS completed.   PMFS History:  Patient Active Problem List   Diagnosis Date Noted  . Sepsis ([place]) 09/17/2019  . AMS (altered mental status) 09/17/2019  . Morbid obesity ([place]) 01/10/2019  . Prediabetes 01/10/2019  . Tobacco use disorder, continuous 01/10/2019  . Soft tissue swelling of back 01/10/2019  . Fibromyalgia 07/27/2017  . Other fatigue 07/27/2017  . History of hidradenitis suppurativa 07/27/2017  . History of hypertension 07/27/2017  . History of hyperlipidemia 07/27/2017  . Smoker 07/27/2017  . High risk medication use 08/21/2016  . Rheumatoid arthritis of multiple sites without rheumatoid factor ([place]) 08/12/2016  . ARTHRITIS, GENERALIZED 07/14/2010  . HYPERTENSION, BENIGN ESSENTIAL 03/04/2009  . POLYURIA 03/04/2009  . FIBROMYALGIA 11/09/2007  . HIDRADENITIS SUPPURATIVA 07/11/2007  . Mixed hyperlipidemia 06/28/2007  . LEG EDEMA, BILATERAL 05/05/2007    Past Medical History:  Diagnosis Date  . Fibromyalgia   . Fibromyalgia   . Hypertension   . Osteoarthritis   . Rheumatoid arthritis(714.0)   . Rheumatoid  arthritis(714.0)     Family History  Problem Relation Age of Onset  . Diabetes Mother   . Healthy Father   . Heart attack Maternal Grandmother   . Stroke Maternal Grandmother    No past surgical history on file. Social History   Social History Narrative  . Not on file   Immunization History  Administered Date(s) Administered  . Influenza,inj,Quad PF,6+ Mos 09/18/2019  . Influenza-Unspecified 07/18/2014  . PFIZER SARS-COV-2 Vaccination 02/11/2020, 03/03/2020  . Td 11/11/2008     Objective: Vital Signs: There were no vitals taken for this visit.   Physical Exam   Musculoskeletal Exam: ***  CDAI Exam: CDAI Score: -- Patient Global: --; Provider Global: -- Swollen: --; Tender: -- Joint Exam 07/10/2020   No joint exam has been documented for this visit   There is currently no information documented on the homunculus. Go to the Rheumatology activity and complete the homunculus joint exam.  Investigation: No additional findings.  Imaging: No results found.  Recent Labs: Lab Results  Component Value Date   WBC 7.5 01/24/2020   HGB 13.2 01/24/2020   PLT 345 01/24/2020   NA 138 04/16/2020   K 4.2 04/16/2020   CL 101 04/16/2020   CO2 25 04/16/2020   GLUCOSE 107 (H) 04/16/2020   BUN 6 04/16/2020   CREATININE 0.51 (L) 04/16/2020   BILITOT 0.4 04/16/2020   ALKPHOS 99 04/16/2020   AST 16 04/16/2020   ALT 19 04/16/2020   PROT 8.0 04/16/2020   ALBUMIN 4.0 04/16/2020   CALCIUM 9.8 04/16/2020   GFRAA 132 04/16/2020   QFTBGOLDPLUS NEGATIVE 04/30/2019    Speciality Comments: No specialty comments available.  Procedures:  No procedures performed Allergies: Patient has no known  allergies.   Assessment / Plan:     Visit Diagnoses: Rheumatoid arthritis of multiple sites without rheumatoid factor (HCC)  High risk medication use  Fibromyalgia  Other fatigue  Primary osteoarthritis of both knees  History of hidradenitis suppurativa  History of  hyperlipidemia  History of hypertension  Trapezius muscle spasm  Orders: No orders of the defined types were placed in this encounter.  No orders of the defined types were placed in this encounter.   Face-to-face time spent with patient was *** minutes. Greater than 50% of time was spent in counseling and coordination of care.  Follow-Up Instructions: No follow-ups on file.   Ofilia Neas, PA-C  Note - This record has been created using Dragon software.  Chart creation errors have been sought, but may not always  have been located. Such creation errors do not reflect on  the standard of medical care.

## 2020-07-07 NOTE — Patient Instructions (Signed)
Visit Information  Goals Addressed      Patient Stated   .  "I would like to learn more about Diabetes" (pt-stated)   On track     Current Barriers:  [name] Knowledge Deficits related to disease process and Self Health Management of Diabetes . Chronic Disease Management support and education needs related to HTN, rheumatoid arthritis, mixed hyperlipidemia, prediabetes  Nurse Case Manager Clinical Goal(s):  [name] Over the next 30 days, patient will verbalize basic understanding of Diabetes disease process and self health management plan as evidenced by patient will review printed educational materials mailed by [facility] and will verbalize having a basic understanding of this disease process and how to improve and or maintain current A1C.  Goal Met  . New 07/02/20 Over the next 90 days, patient will continue to work with the CCM team and PCP for disease education and support to improve Self Health management of Prediabetes  Interventions:  07/02/20 call completed with patient  . Evaluation of current treatment plan related to prediabetes and patient's adherence to plan as established by provider . Re-educated patient on target A1c; Reviewed and discussed current A1c  6.1%; Re-educated patient re: ongoing dietary and exercise recommendations; Re-educated patient on daily glycemic control . Positive reinforcement given to patient for making dietary and exercise modifications to improve the Self Health management of her Prediabetes . Reviewed medications with patient and discussed importance of taking medications exactly as prescribed w/o missed doses for best effectiveness . Printed mailed patient educational materials related to Diabetes Care Schedule; Grocery Shopping with Diabetes; Preventing Complications from Diabetes . Discussed plans with patient for ongoing care management follow up and provided patient with direct contact information for care management team  Patient Self Care Activities:  . Self  administers medications as prescribed . Attends all scheduled provider appointments . Calls pharmacy for medication refills . Performs ADL's independently . Performs IADL's independently . Calls provider office for new concerns or questions  Please see past updates related to this goal by clicking on the "Past Updates" button in the selected goal   l    .  "to get good control over my BP" (pt-stated)        CARE PLAN ENTRY (see longitudinal plan of care for additional care plan information)  Current Barriers:  [name] Knowledge Deficits related to disease process and Self Health management of HTN . Chronic Disease Management support and education needs related to HTN, rheumatoid arthritis, mixed hyperlipidemia, prediabetes  Nurse Case Manager Clinical Goal(s):  [name] Over the next 180 days, patient will work with the CCM team and PCP to address needs related to disease education and support for improved Self Health management of HTN  CCM RN CM Interventions:  07/02/20 call completed with patient  . Inter-disciplinary care team collaboration (see longitudinal plan of care) . Evaluation of current treatment plan related to HTN and patient's adherence to plan as established by provider. . Provided education to patient re: BP <130/80; Educated on dietary and exercise recommendations; Determined patient's BP was elevated at last OV BP was elevated to 162/102, P 98; Determined patient is Self monitoring her BP and will continue to monitor and record . Reviewed medications with patient and discussed patient is adhering to her prescribed medication regimen; Educated on the importance of taking her BP medications exactly as prescribed w/o missed does; Current treatment:  o amLODipine (NORVASC) 10 MG tablet; Take 1 tablet (10 mg total) by mouth daily o metoprolol tartrate (LOPRESSOR) 50  MG tablet; Take 1 tablet (50 mg total) by mouth 2 (two) times daily . Discussed plans with patient for ongoing care  management follow up and provided patient with direct contact information for care management team . Advised patient, providing education and rationale, to monitor blood pressure daily and record, calling the CCM team and or PCP for findings outside established parameters . Provided patient with printed educational materials related to "What is High Blood Pressure?"; "High Blood Pressure and African Americans"; "Why Should I Lower Sodium"; "High Blood Pressure and Stroke"; "Life's Simple 7"  Patient Self Care Activities:  . Self administers medications as prescribed . Attends all scheduled provider appointments . Calls pharmacy for medication refills . Calls provider office for new concerns or questions  Initial goal documentation     .  "to lower my Cholesterol levels" (pt-stated)        CARE PLAN ENTRY (see longitudinal plan of care for additional care plan information)  Current Barriers:  [name] Knowledge Deficits related to disease process and Self Health management of HLD . Chronic Disease Management support and education needs related to HTN, rheumatoid arthritis, mixed hyperlipidemia, prediabetes  Nurse Case Manager Clinical Goal(s):  [name] Over the next 180 days, patient will work with the CCM team and PCP to address needs related to disease education and support for improved Self Health management of mixed hyperlipidemia  CCM RN CM Interventions:  07/02/20 call completed with patient  . Inter-disciplinary care team collaboration (see longitudinal plan of care) . Evaluation of current treatment plan related to mixed hyperlipidemia and patient's adherence to plan as established by provider. . Provided education to patient re: current lipid levels; Educated patient on target ranges; Educated on dietary and exercise recommendations to help lower her cholesterol levels . Reviewed medications with patient and discussed patient is adhering to taking Zetia 10 mg qd as directed, patient was not  aware PCP added Atorvastatin; Instructed patient per PCP she should add Atorvastatin 10 mg take 1 tablet at bedtime; Discussed patient will fill this Rx today and will start this med tonight  . Discussed plans with patient for ongoing care management follow up and provided patient with direct contact information for care management team . Provided patient with printed educational materials related to !3 Cholesterol Lowering Foods   Patient Self Care Activities:  . Self administers medications as prescribed . Attends all scheduled provider appointments . Calls pharmacy for medication refills . Calls provider office for new concerns or questions  Initial goal documentation      Other   .  COMPLETED: Assist with Chronic Disease Management and Care Coordination         Current Barriers:  [name] Knowledge Barriers related to resources and support available to address needs related to Chronic Disease Management and Care Coordination Services  Case Manager Clinical Goal(s):  [name] Over the next 30 days, patient will work with the CCM team to address needs related to Chronic Disease Management and [employer].   Interventions:  . Collaborated with BSW and initiated plan of care to address needs related to Advanced Directives and patient specified chronic disease management.   Patient Self Care Activities:  . Self administers medications as prescribed . Attends all scheduled provider appointments . Calls provider office for new concerns or questions  Initial goal documentation        Patient verbalizes understanding of instructions provided today.   Telephone follow up appointment with care management team member scheduled for: 08/27/20  Barb Merino, RN, BSN, CCM Care Management Coordinator [place] [facility]  Direct Phone: [phone]

## 2020-07-07 NOTE — Chronic Care Management (AMB) (Signed)
Chronic Care Management   Follow Up Note   07/02/2020 Name: Shelby Robertson MRN: [id number] DOB: 1971/09/26  Referred by: Minette Brine, FNP Reason for referral : Chronic Care Management (FU RN CM Call)   Shelby Robertson is a 49 y.o. year old female who is a primary care patient of Minette Brine, [place]. The CCM team was consulted for assistance with chronic disease management and care coordination needs.    Review of patient status, including review of consultants reports, relevant laboratory and other test results, and collaboration with appropriate care team members and the patient's provider was performed as part of comprehensive patient evaluation and provision of chronic care management services.    SDOH (Social Determinants of Health) assessments performed: Yes See Care Plan activities for detailed interventions related to [place])   Placed outbound CCM RN CM follow up call to patient for a care plan update.    Outpatient Encounter Medications as of 07/02/2020  Medication Sig Note  . acetaminophen (TYLENOL) 650 MG CR tablet Take 1,300 mg by mouth every 8 (eight) hours as needed for pain.   [name] albuterol (PROVENTIL) (2.5 MG/3ML) 0.083% nebulizer solution Take 3 mLs (2.5 mg total) by nebulization every 6 (six) hours as needed for wheezing or shortness of breath.   [name] albuterol (VENTOLIN HFA) 108 (90 Base) MCG/ACT inhaler Inhale 2 puffs into the lungs every 4 (four) hours as needed for wheezing or shortness of breath (cough, shortness of breath or wheezing.).   [name] amLODipine (NORVASC) 10 MG tablet Take 1 tablet (10 mg total) by mouth daily.   [name] aspirin 325 MG tablet Take 325 mg by mouth every 6 (six) hours as needed (pain).   [name] atorvastatin (LIPITOR) 10 MG tablet Take 1 tablet (10 mg total) by mouth at bedtime.   . B-D TB SYRINGE 1CC/27GX1/2" 27G X 1/2" 1 ML MISC USE 1 SYRINGE TO INJECT METHOTREXATE INTO THE SKIN ONCE A WEEK.   . blood glucose meter kit and supplies KIT Dispense based on  patient and insurance preference. Use up to two times daily as directed. (FOR ICD-9 250.00, 250.01).   [name] diclofenac sodium (VOLTAREN) 1 % GEL Apply 2 grams to 4 grams topically to affected area up to 4 times daily PRN (Patient taking differently: Apply 2-4 g topically 4 (four) times daily as needed (pain). ) 09/17/2019: Son in not sure if patient is still using this or not  . diphenhydrAMINE (BENADRYL) 25 MG tablet Take 25 mg by mouth every 6 (six) hours as needed for itching, allergies or sleep.   . DULoxetine (CYMBALTA) 60 MG capsule Take 60 mg by mouth daily.   09/17/2019: #30 filled 08/10/19 [facility] - bottle is empty  . ezetimibe (ZETIA) 10 MG tablet Take 1 tablet (10 mg total) by mouth daily.   . folic acid (FOLVITE) 1 MG tablet Take 2 tablets (2 mg total) by mouth daily.   [name] HUMIRA PEN 40 MG/0.8ML PNKT INJECT 1 PEN INTO THE SKIN EVERY 14 DAYS   . hydrochlorothiazide (HYDRODIURIL) 12.5 MG tablet Take 1 tablet (12.5 mg total) by mouth daily as needed (fluid/edema).   [name] HYDROcodone-acetaminophen (NORCO) 10-325 MG tablet Take 1 tablet by mouth every 6 (six) hours. 09/17/2019: #120 filled [facility]/20/19 [facility] per PMP AWARE - bottle is not in pt's medication bag  . hydrOXYzine (ATARAX/VISTARIL) 25 MG tablet Take 1 tablet (25 mg total) by mouth every 8 (eight) hours as needed for itching.   . Lancets (ONETOUCH DELICA PLUS [id number]) MISC 2 (two)  times daily. use for testing   . metFORMIN (GLUCOPHAGE) 500 MG tablet Take 1 tablet (500 mg total) by mouth 2 (two) times daily with a meal.   . methotrexate 50 MG/2ML injection Inject 0.8 mls (20 mg total) into the skin weekly.   . metoprolol tartrate (LOPRESSOR) 50 MG tablet Take 1 tablet (50 mg total) by mouth 2 (two) times daily. Please schedule appointment for refills.   . mometasone (ELOCON) 0.1 % cream Apply to affected area daily   . ONETOUCH VERIO test strip TEST TWICE DAILY   . selenium sulfide (SELSUN) 2.5 % shampoo Apply 1 application topically daily as needed  for irritation or itching.   [name] tiZANidine (ZANAFLEX) 4 MG capsule Take 4 mg by mouth every 6 (six) hours.    No facility-administered encounter medications on file as of 07/02/2020.     Objective:  Lab Results  Component Value Date   HGBA1C 6.1 (H) 04/16/2020   HGBA1C 6.6 02/07/2020   HGBA1C 6.5 (H) 01/16/2020   Lab Results  Component Value Date   MICROALBUR 80 01/16/2020   LDLCALC 143 (H) 04/16/2020   CREATININE 0.51 (L) 04/16/2020   BP Readings from Last 3 Encounters:  05/15/20 (!) 143/91  04/16/20 (!) 162/102  02/07/20 131/86    Goals Addressed      Patient Stated   .  "I would like to learn more about Diabetes" (pt-stated)   On track     Current Barriers:  [name] Knowledge Deficits related to disease process and Self Health Management of Diabetes . Chronic Disease Management support and education needs related to HTN, rheumatoid arthritis, mixed hyperlipidemia, prediabetes  Nurse Case Manager Clinical Goal(s):  [name] Over the next 30 days, patient will verbalize basic understanding of Diabetes disease process and self health management plan as evidenced by patient will review printed educational materials mailed by [facility] and will verbalize having a basic understanding of this disease process and how to improve and or maintain current A1C.  Goal Met  . New 07/02/20 Over the next 90 days, patient will continue to work with the CCM team and PCP for disease education and support to improve Self Health management of Prediabetes  Interventions:  07/02/20 call completed with patient  . Evaluation of current treatment plan related to prediabetes and patient's adherence to plan as established by provider . Re-educated patient on target A1c; Reviewed and discussed current A1c  6.1%; Re-educated patient re: ongoing dietary and exercise recommendations; Re-educated patient on daily glycemic control . Positive reinforcement given to patient for making dietary and exercise modifications to  improve the Self Health management of her Prediabetes . Reviewed medications with patient and discussed importance of taking medications exactly as prescribed w/o missed doses for best effectiveness . Printed mailed patient educational materials related to Diabetes Care Schedule; Grocery Shopping with Diabetes; Preventing Complications from Diabetes . Discussed plans with patient for ongoing care management follow up and provided patient with direct contact information for care management team  Patient Self Care Activities:  . Self administers medications as prescribed . Attends all scheduled provider appointments . Calls pharmacy for medication refills . Performs ADL's independently . Performs IADL's independently . Calls provider office for new concerns or questions  Please see past updates related to this goal by clicking on the "Past Updates" button in the selected goal   l    .  "to get good control over my BP" (pt-stated)        CARE PLAN ENTRY (  see longitudinal plan of care for additional care plan information)  Current Barriers:  [name] Knowledge Deficits related to disease process and Self Health management of HTN . Chronic Disease Management support and education needs related to HTN, rheumatoid arthritis, mixed hyperlipidemia, prediabetes  Nurse Case Manager Clinical Goal(s):  [name] Over the next 180 days, patient will work with the CCM team and PCP to address needs related to disease education and support for improved Self Health management of HTN  CCM RN CM Interventions:  07/02/20 call completed with patient  . Inter-disciplinary care team collaboration (see longitudinal plan of care) . Evaluation of current treatment plan related to HTN and patient's adherence to plan as established by provider. . Provided education to patient re: BP <130/80; Educated on dietary and exercise recommendations; Determined patient's BP was elevated at last OV BP was elevated to 162/102, P 98;  Determined patient is Self monitoring her BP and will continue to monitor and record . Reviewed medications with patient and discussed patient is adhering to her prescribed medication regimen; Educated on the importance of taking her BP medications exactly as prescribed w/o missed does; Current treatment:  o amLODipine (NORVASC) 10 MG tablet; Take 1 tablet (10 mg total) by mouth daily o metoprolol tartrate (LOPRESSOR) 50 MG tablet; Take 1 tablet (50 mg total) by mouth 2 (two) times daily . Discussed plans with patient for ongoing care management follow up and provided patient with direct contact information for care management team . Advised patient, providing education and rationale, to monitor blood pressure daily and record, calling the CCM team and or PCP for findings outside established parameters . Provided patient with printed educational materials related to "What is High Blood Pressure?"; "High Blood Pressure and African Americans"; "Why Should I Lower Sodium"; "High Blood Pressure and Stroke"; "Life's Simple 7"  Patient Self Care Activities:  . Self administers medications as prescribed . Attends all scheduled provider appointments . Calls pharmacy for medication refills . Calls provider office for new concerns or questions  Initial goal documentation    .  "to lower my Cholesterol levels" (pt-stated)        CARE PLAN ENTRY (see longitudinal plan of care for additional care plan information)  Current Barriers:  [name] Knowledge Deficits related to disease process and Self Health management of HLD . Chronic Disease Management support and education needs related to HTN, rheumatoid arthritis, mixed hyperlipidemia, prediabetes  Nurse Case Manager Clinical Goal(s):  [name] Over the next 180 days, patient will work with the CCM team and PCP to address needs related to disease education and support for improved Self Health management of mixed hyperlipidemia  CCM RN CM Interventions:  07/02/20  call completed with patient  . Inter-disciplinary care team collaboration (see longitudinal plan of care) . Evaluation of current treatment plan related to mixed hyperlipidemia and patient's adherence to plan as established by provider. . Provided education to patient re: current lipid levels; Educated patient on target ranges; Educated on dietary and exercise recommendations to help lower her cholesterol levels . Reviewed medications with patient and discussed patient is adhering to taking Zetia 10 mg qd as directed, patient was not aware PCP added Atorvastatin; Instructed patient per PCP she should add Atorvastatin 10 mg take 1 tablet at bedtime; Discussed patient will fill this Rx today and will start this med tonight  . Discussed plans with patient for ongoing care management follow up and provided patient with direct contact information for care management team . Provided patient  with printed educational materials related to !3 Cholesterol Lowering Foods   Patient Self Care Activities:  . Self administers medications as prescribed . Attends all scheduled provider appointments . Calls pharmacy for medication refills . Calls provider office for new concerns or questions  Initial goal documentation      Other   .  COMPLETED: Assist with Chronic Disease Management and Care Coordination         Current Barriers:  [name] Knowledge Barriers related to resources and support available to address needs related to Chronic Disease Management and Care Coordination Services  Case Manager Clinical Goal(s):  [name] Over the next 30 days, patient will work with the CCM team to address needs related to Chronic Disease Management and [employer].   Interventions:  . Collaborated with BSW and initiated plan of care to address needs related to Advanced Directives and patient specified chronic disease management.   Patient Self Care Activities:  . Self administers medications as prescribed . Attends all  scheduled provider appointments . Calls provider office for new concerns or questions  Initial goal documentation       Plan:   Telephone follow up appointment with care management team member scheduled for: 08/27/20  Barb Merino, RN, BSN, CCM Care Management Coordinator [place] [facility]  Direct Phone: [phone]

## 2020-07-10 ENCOUNTER — Other Ambulatory Visit: Payer: Self-pay

## 2020-07-10 ENCOUNTER — Ambulatory Visit: Payer: Medicare Other | Admitting: Rheumatology

## 2020-07-10 DIAGNOSIS — Z79899 Other long term (current) drug therapy: Secondary | ICD-10-CM

## 2020-07-11 NOTE — Progress Notes (Signed)
Office Visit Note  Patient: Shelby Robertson             Date of Birth: 1971/05/15           MRN: [id number]             PCP: Minette Brine, FNP Referring: Minette Brine, FNP Visit Date: 07/15/2020 Occupation: @GUAROCC @  Subjective:  Pain of the Left Elbow and Medication Management   History of Present Illness: Shelby Robertson is a 49 y.o. female with history of rheumatoid arthritis and osteoarthritis overlap.  She was accompanied by her father today.  She states she was having swelling in her left elbow before. She continues to have some discomfort in her left elbow. Overall her rheumatoid arthritis is much better controlled with the combination of Humira and methotrexate. She has been taking her medications on a regular basis. None of the other joints are painful.  Activities of Daily Living:  Patient reports morning stiffness for 20-30 minutes.   Patient Denies nocturnal pain.  Difficulty dressing/grooming: Denies Difficulty climbing stairs: Reports Difficulty getting out of chair: Reports Difficulty using hands for taps, buttons, cutlery, and/or writing: Reports  Review of Systems  Constitutional: Positive for fatigue.  HENT: Positive for mouth dryness. Negative for mouth sores and nose dryness.   Eyes: Negative for pain, visual disturbance and dryness.  Respiratory: Negative for cough, hemoptysis, shortness of breath and difficulty breathing.   Cardiovascular: Negative for chest pain, palpitations and swelling in legs/feet.  Gastrointestinal: Negative for abdominal pain, blood in stool, constipation and diarrhea.  Endocrine: Negative for increased urination.  Genitourinary: Negative for difficulty urinating and painful urination.  Musculoskeletal: Positive for arthralgias, joint pain, myalgias, morning stiffness, muscle tenderness and myalgias. Negative for joint swelling and muscle weakness.  Skin: Negative for color change, rash and redness.  Allergic/Immunologic: Negative  for susceptible to infections.  Neurological: Negative for dizziness, headaches, memory loss and weakness.  Hematological: Negative for swollen glands.  Psychiatric/Behavioral: Negative for depressed mood, confusion and sleep disturbance. The patient is not nervous/anxious.     PMFS History:  Patient Active Problem List   Diagnosis Date Noted  . Sepsis ([place]) 09/17/2019  . AMS (altered mental status) 09/17/2019  . Morbid obesity ([place]) 01/10/2019  . Prediabetes 01/10/2019  . Tobacco use disorder, continuous 01/10/2019  . Soft tissue swelling of back 01/10/2019  . Fibromyalgia 07/27/2017  . Other fatigue 07/27/2017  . History of hidradenitis suppurativa 07/27/2017  . History of hypertension 07/27/2017  . History of hyperlipidemia 07/27/2017  . Smoker 07/27/2017  . High risk medication use 08/21/2016  . Rheumatoid arthritis of multiple sites without rheumatoid factor ([place]) 08/12/2016  . ARTHRITIS, GENERALIZED 07/14/2010  . HYPERTENSION, BENIGN ESSENTIAL 03/04/2009  . POLYURIA 03/04/2009  . FIBROMYALGIA 11/09/2007  . HIDRADENITIS SUPPURATIVA 07/11/2007  . Mixed hyperlipidemia 06/28/2007  . LEG EDEMA, BILATERAL 05/05/2007    Past Medical History:  Diagnosis Date  . Fibromyalgia   . Fibromyalgia   . Hypertension   . Osteoarthritis   . Rheumatoid arthritis(714.0)   . Rheumatoid arthritis(714.0)     Family History  Problem Relation Age of Onset  . Diabetes Mother   . Healthy Father   . Heart attack Maternal Grandmother   . Stroke Maternal Grandmother    History reviewed. No pertinent surgical history. Social History   Social History Narrative  . Not on file   Immunization History  Administered Date(s) Administered  . Influenza,inj,Quad PF,6+ Mos 09/18/2019  . Influenza-Unspecified 07/18/2014  . PFIZER SARS-COV-2  Vaccination 02/11/2020, 03/03/2020  . Td 11/11/2008     Objective: Vital Signs: BP (!) 152/102 (BP Location: Left Arm, Patient Position: Sitting, Cuff  Size: Small)   Pulse (!) 111   Ht 5\' 6"  (1.676 m)   Wt 298 lb 3.2 oz (135.3 kg)   BMI 48.13 kg/m    Physical Exam Vitals and nursing note reviewed.  Constitutional:      Appearance: She is well-developed.  HENT:     Head: Normocephalic and atraumatic.  Eyes:     Conjunctiva/sclera: Conjunctivae normal.  Cardiovascular:     Rate and Rhythm: Normal rate and regular rhythm.     Heart sounds: Normal heart sounds.  Pulmonary:     Effort: Pulmonary effort is normal.     Breath sounds: Normal breath sounds.  Abdominal:     General: Bowel sounds are normal.     Palpations: Abdomen is soft.  Musculoskeletal:     Cervical back: Normal range of motion.  Lymphadenopathy:     Cervical: No cervical adenopathy.  Skin:    General: Skin is warm and dry.     Capillary Refill: Capillary refill takes less than 2 seconds.  Neurological:     Mental Status: She is alert and oriented to person, place, and time.  Psychiatric:        Behavior: Behavior normal.      Musculoskeletal Exam: C-spine was in good range of motion.  Shoulder joints were in good range of motion.  She has about 5 degrees contracture in her left elbow joint and discomfort.  Wrist joints were in good range of motion.  She is contracture in her left hand MCPs.  There was no synovitis.  Hip joints, knee joints, ankles, MTPs been good range of motion with no tenderness.  CDAI Exam: CDAI Score: 1.4  Patient Global: 2 mm; Provider Global: 2 mm Swollen: 0 ; Tender: 1  Joint Exam 07/15/2020      Right  Left  Elbow      Tender     Investigation: No additional findings.  Imaging: No results found.  Recent Labs: Lab Results  Component Value Date   WBC 7.0 07/10/2020   HGB 13.4 07/10/2020   PLT 396 07/10/2020   NA 136 07/10/2020   K 4.0 07/10/2020   CL 102 07/10/2020   CO2 27 07/10/2020   GLUCOSE 117 (H) 07/10/2020   BUN 11 07/10/2020   CREATININE 0.60 07/10/2020   BILITOT 0.3 07/10/2020   ALKPHOS 99 04/16/2020     AST 13 07/10/2020   ALT 13 07/10/2020   PROT 7.5 07/10/2020   ALBUMIN 4.0 04/16/2020   CALCIUM 9.0 07/10/2020   GFRAA 126 07/10/2020   QFTBGOLDPLUS NEGATIVE 07/10/2020    Speciality Comments: No specialty comments available.  Procedures:  No procedures performed Allergies: Patient has no known allergies.   Assessment / Plan:     Visit Diagnoses: Rheumatoid arthritis of multiple sites without rheumatoid factor (HCC)-she had no synovitis on examination.  She has left elbow discomfort and contracture.  She has been tolerating medications well.  High risk medication use - Humira 40 mg sq once every 14 days, MTX 0.8 ml sq once weekly, folic acid 2 mg daily.  Her labs are stable.  She has been advised to get labs every 3 months.  Pain in left elbow-she continues to have some discomfort in left elbow joint and 5 degree contracture.  I wanted to inject her left elbow joint but her blood pressure  was elevated.  I have advised her to contact me in case her blood pressure improves.  Fibromyalgia-she continues to have some joint discomfort from fibromyalgia but is tolerable.  Other fatigue-improved.  Primary osteoarthritis of both knees-knee joint discomfort is manageable.  History of hidradenitis suppurativa-no recent episodes since last visit.  History of hypertension-her blood pressure was high today.  Patient states she has not taken her blood pressure medication.  The need for taking her blood pressure medication on a regular basis were discussed.  History of hyperlipidemia-monitoring of cholesterol was also discussed.  We had detailed discussion regarding increased risk of heart disease in patients with rheumatoid arthritis.  Weight loss diet and exercise was discussed.  Monitoring blood pressure and cholesterol was discussed.  I advised her to get a blood pressure monitor.  Smoker-smoking cessation was discussed.  Association of smoking with rheumatoid arthritis was  discussed.  Educated about COVID-19 virus infection-per ACR guidelines instructions were placed in the AVS.  All the guidelines were discussed at length.  Orders: No orders of the defined types were placed in this encounter.  No orders of the defined types were placed in this encounter.   Follow-Up Instructions: Return in about 3 months (around 10/14/2020) for Rheumatoid arthritis, Osteoarthritis.   Bo Merino, MD  Note - This record has been created using Editor, commissioning.  Chart creation errors have been sought, but may not always  have been located. Such creation errors do not reflect on  the standard of medical care.

## 2020-07-11 NOTE — Progress Notes (Signed)
CBC and CMP normal

## 2020-07-13 LAB — COMPLETE METABOLIC PANEL WITH GFR
AG Ratio: 1.1 (calc) (ref 1.0–2.5)
ALT: 13 U/L (ref 6–29)
AST: 13 U/L (ref 10–35)
Albumin: 3.9 g/dL (ref 3.6–5.1)
Alkaline phosphatase (APISO): 80 U/L (ref 31–125)
BUN: 11 mg/dL (ref 7–25)
CO2: 27 mmol/L (ref 20–32)
Calcium: 9 mg/dL (ref 8.6–10.2)
Chloride: 102 mmol/L (ref 98–110)
Creat: 0.6 mg/dL (ref 0.50–1.10)
GFR, Est African American: 126 mL/min/{1.73_m2} (ref >=60)
GFR, Est Non African American: 109 mL/min/{1.73_m2} (ref >=60)
Globulin: 3.6 g/dL (calc) (ref 1.9–3.7)
Glucose, Bld: 117 mg/dL — ABNORMAL HIGH (ref 65–99)
Potassium: 4 mmol/L (ref 3.5–5.3)
Sodium: 136 mmol/L (ref 135–146)
Total Bilirubin: 0.3 mg/dL (ref 0.2–1.2)
Total Protein: 7.5 g/dL (ref 6.1–8.1)

## 2020-07-13 LAB — CBC WITH DIFFERENTIAL/PLATELET
Absolute Monocytes: 399 cells/uL (ref 200–950)
Basophils Absolute: 21 cells/uL (ref 0–200)
Basophils Relative: 0.3 %
Eosinophils Absolute: 189 cells/uL (ref 15–500)
Eosinophils Relative: 2.7 %
HCT: 40.1 % (ref 35.0–45.0)
Hemoglobin: 13.4 g/dL (ref 11.7–15.5)
Lymphs Abs: 2604 cells/uL (ref 850–3900)
MCH: 29.1 pg (ref 27.0–33.0)
MCHC: 33.4 g/dL (ref 32.0–36.0)
MCV: 87.2 fL (ref 80.0–100.0)
MPV: 9.5 fL (ref 7.5–12.5)
Monocytes Relative: 5.7 %
Neutro Abs: 3787 cells/uL (ref 1500–7800)
Neutrophils Relative %: 54.1 %
Platelets: 396 10*3/uL (ref 140–400)
RBC: 4.6 10*6/uL (ref 3.80–5.10)
RDW: 14.2 % (ref 11.0–15.0)
Total Lymphocyte: 37.2 %
WBC: 7 10*3/uL (ref 3.8–10.8)

## 2020-07-13 LAB — QUANTIFERON-TB GOLD PLUS
Mitogen-NIL: >10 IU/mL
NIL: 0.06 IU/mL
QuantiFERON-TB Gold Plus: NEGATIVE
TB1-NIL: <0 IU/mL
TB2-NIL: <0 IU/mL

## 2020-07-14 NOTE — Progress Notes (Signed)
TB gold negative

## 2020-07-15 ENCOUNTER — Encounter: Payer: Self-pay | Admitting: Rheumatology

## 2020-07-15 ENCOUNTER — Other Ambulatory Visit: Payer: Self-pay

## 2020-07-15 ENCOUNTER — Ambulatory Visit (INDEPENDENT_AMBULATORY_CARE_PROVIDER_SITE_OTHER): Payer: Medicare Other | Admitting: Rheumatology

## 2020-07-15 VITALS — BP 152/102 | HR 111 | Ht 66.0 in | Wt 298.2 lb

## 2020-07-15 DIAGNOSIS — Z8639 Personal history of other endocrine, nutritional and metabolic disease: Secondary | ICD-10-CM

## 2020-07-15 DIAGNOSIS — M25522 Pain in left elbow: Secondary | ICD-10-CM | POA: Diagnosis not present

## 2020-07-15 DIAGNOSIS — M0609 Rheumatoid arthritis without rheumatoid factor, multiple sites: Secondary | ICD-10-CM | POA: Diagnosis not present

## 2020-07-15 DIAGNOSIS — M797 Fibromyalgia: Secondary | ICD-10-CM

## 2020-07-15 DIAGNOSIS — Z7189 Other specified counseling: Secondary | ICD-10-CM

## 2020-07-15 DIAGNOSIS — M170 Bilateral primary osteoarthritis of knee: Secondary | ICD-10-CM

## 2020-07-15 DIAGNOSIS — Z872 Personal history of diseases of the skin and subcutaneous tissue: Secondary | ICD-10-CM

## 2020-07-15 DIAGNOSIS — Z79899 Other long term (current) drug therapy: Secondary | ICD-10-CM

## 2020-07-15 DIAGNOSIS — R5383 Other fatigue: Secondary | ICD-10-CM

## 2020-07-15 DIAGNOSIS — F17200 Nicotine dependence, unspecified, uncomplicated: Secondary | ICD-10-CM

## 2020-07-15 DIAGNOSIS — Z8679 Personal history of other diseases of the circulatory system: Secondary | ICD-10-CM

## 2020-07-15 NOTE — Patient Instructions (Addendum)
COVID-19 vaccine recommendations:   COVID-19 vaccine is recommended for everyone (unless you are allergic to a vaccine component), even if you are on a medication that suppresses your immune system.   If you are on Methotrexate, Cellcept (mycophenolate), Rinvoq, Morrie Sheldon, and Olumiant- hold the medication for 1 week after each vaccine. Hold Methotrexate for 2 weeks after the single dose COVID-19 vaccine.   If you are on Orencia subcutaneous injection - hold medication one week prior to and one week after the first COVID-19 vaccine dose (only).   If you are on Orencia IV infusions- time vaccination administration so that the first COVID-19 vaccination will occur four weeks after the infusion and postpone the subsequent infusion by one week.   If you are on Cyclophosphamide or Rituxan infusions please contact your doctor prior to receiving the COVID-19 vaccine.   Do not take Tylenol or any anti-inflammatory medications (NSAIDs) 24 hours prior to the COVID-19 vaccination.   There is no direct evidence about the efficacy of the COVID-19 vaccine in individuals who are on medications that suppress the immune system.   Even if you are fully vaccinated, and you are on any medications that suppress your immune system, please continue to wear a mask, maintain at least six feet social distance and practice hand hygiene.   If you develop a COVID-19 infection, please contact your PCP or our office to determine if you need antibody infusion.  The booster vaccine is now available for immunocompromised patients. It is advised that if you had Pfizer vaccine you should get [employer] booster.  If you had a Moderna vaccine then you should get a Moderna booster. Johnson and Wynetta Emery does not have a booster vaccine at this time.  Please see the following web sites for updated information.    [email or web address]://[email or web address]/[email or web address]-Vaccination-Patient-Resources.[email or web address]  [email or web address]://[email or web address]/About-[email or web address]-Releases/ID/1159  Standing Labs We placed an order today for your standing lab work.   Please have your standing labs drawn in December and every 3 months  If possible, please have your labs drawn 2 weeks prior to your appointment so that the provider can discuss your results at your appointment.  We have open lab daily Monday through Thursday from 8:30-12:30 PM and 1:30-4:30 PM and Friday from 8:30-12:30 PM and 1:30-4:00 PM at the office of Dr. Bo Merino, [facility].   Please be advised, patients with office appointments requiring lab work will take precedents over walk-in lab work.  If possible, please come for your lab work on Monday and Friday afternoons, as you may experience shorter wait times. The office is located at [address] [place], [place], [place] [address] No appointment is necessary.   Labs are drawn by [facility]. Please bring your co-pay at the time of your lab draw.  You may receive a bill from [facility] for your lab work.  If you wish to have your labs drawn at another location, please call the office 24 hours in advance to send orders.  If you have any questions regarding directions or hours of operation,  please call [phone].   As a reminder, please drink plenty of water prior to coming for your lab work. Thanks!  Please monitor your blood pressure closely and take your blood pressure medications on a regular basis.  I would recommend getting a blood pressure monitor.  If your blood pressure continues to be elevated please follow-up with your PCP.  People with rheumatoid arthritis have increased risk of heart disease.  I would recommend weight loss, regular exercise, control of  blood pressure and cholesterol.

## 2020-07-21 ENCOUNTER — Encounter: Payer: Medicare Other | Admitting: Nurse Practitioner

## 2020-07-28 ENCOUNTER — Other Ambulatory Visit: Payer: Self-pay | Admitting: Rheumatology

## 2020-07-28 MED FILL — HUMIRA PEN 40 MG/0.8ML PNKT: 40 | 28 days supply | Qty: 2 | Fill #0

## 2020-07-28 NOTE — Telephone Encounter (Signed)
Last Visit: 07/15/2020 Next Visit: 10/21/2020 Labs: 07/10/2020 CBC and CMP normal. TB Gold: 07/10/2020  Current Dose per office note 07/15/2020: Humira 40 mg sq once every 14 days  DX:  Rheumatoid arthritis of multiple sites without rheumatoid factor   Okay to refill per Dr. Estanislado Pandy

## 2020-08-06 ENCOUNTER — Ambulatory Visit: Payer: Medicare Other | Admitting: Physician Assistant

## 2020-08-21 ENCOUNTER — Ambulatory Visit (HOSPITAL_COMMUNITY)
Admission: EM | Admit: 2020-08-21 | Discharge: 2020-08-21 | Disposition: A | Discharge status: Home / Self Care | Payer: Medicare Other | Attending: Family Medicine | Admitting: Family Medicine

## 2020-08-21 ENCOUNTER — Other Ambulatory Visit: Payer: Self-pay

## 2020-08-21 ENCOUNTER — Encounter (HOSPITAL_COMMUNITY): Payer: Self-pay | Admitting: Emergency Medicine

## 2020-08-21 DIAGNOSIS — M542 Cervicalgia: Secondary | ICD-10-CM

## 2020-08-21 MED ORDER — KETOROLAC TROMETHAMINE 60 MG/2ML IM SOLN
INTRAMUSCULAR | Status: AC
  Filled 2020-08-21: qty 2

## 2020-08-21 MED ORDER — KETOROLAC TROMETHAMINE 60 MG/2ML IM SOLN
60.0000 mg | Freq: Once | INTRAMUSCULAR | Status: AC
  Administered 2020-08-21: 60 mg via INTRAMUSCULAR

## 2020-08-21 MED ORDER — DIAZEPAM 5 MG PO TABS: 5.0000 mg | ORAL_TABLET | Freq: Two times a day (BID) | ORAL | 0 refills | Status: DC

## 2020-08-21 NOTE — ED Provider Notes (Signed)
[facility]    CSN: [id number] Arrival date & time: 08/21/20  1445      History   Chief Complaint Chief Complaint  Patient presents with  . Back Pain    HPI Shelby Robertson is a 49 y.o. female.   HPI   Patient has chronic pain.  She is on a pain contract.  She is here today for increased pain.  She has pain in her right shoulder blade and rhomboid region.  Its been present for a few days but much worse the last couple of days.  She did not sleep last night.  She tried tizanidine.  She is on hydrocodone.  These things have not helped her. She has rheumatoid disease She has fibromyalgia She has osteoarthritis  Past Medical History:  Diagnosis Date  . Fibromyalgia   . Fibromyalgia   . Hypertension   . Osteoarthritis   . Rheumatoid arthritis(714.0)   . Rheumatoid arthritis(714.0)     Patient Active Problem List   Diagnosis Date Noted  . Sepsis ([place]) 09/17/2019  . AMS (altered mental status) 09/17/2019  . Morbid obesity ([place]) 01/10/2019  . Prediabetes 01/10/2019  . Tobacco use disorder, continuous 01/10/2019  . Soft tissue swelling of back 01/10/2019  . Fibromyalgia 07/27/2017  . Other fatigue 07/27/2017  . History of hidradenitis suppurativa 07/27/2017  . History of hypertension 07/27/2017  . History of hyperlipidemia 07/27/2017  . Smoker 07/27/2017  . High risk medication use 08/21/2016  . Rheumatoid arthritis of multiple sites without rheumatoid factor ([place]) 08/12/2016  . ARTHRITIS, GENERALIZED 07/14/2010  . HYPERTENSION, BENIGN ESSENTIAL 03/04/2009  . POLYURIA 03/04/2009  . FIBROMYALGIA 11/09/2007  . HIDRADENITIS SUPPURATIVA 07/11/2007  . Mixed hyperlipidemia 06/28/2007  . LEG EDEMA, BILATERAL 05/05/2007    History reviewed. No pertinent surgical history.  OB History   No obstetric history on file.      Home Medications    Prior to Admission medications   Medication Sig Start Date End Date Taking? Authorizing Provider  HUMIRA PEN 40  MG/0.8ML PNKT INJECT 1 PEN INTO THE SKIN EVERY 14 DAYS 07/28/20   Bo Merino, MD  acetaminophen (TYLENOL) 650 MG CR tablet Take 1,300 mg by mouth every 8 (eight) hours as needed for pain.    [provider]  albuterol (PROVENTIL) (2.5 MG/3ML) 0.083% nebulizer solution Take 3 mLs (2.5 mg total) by nebulization every 6 (six) hours as needed for wheezing or shortness of breath. 10/11/19   Robyn Haber, MD  albuterol (VENTOLIN HFA) 108 (90 Base) MCG/ACT inhaler Inhale 2 puffs into the lungs every 4 (four) hours as needed for wheezing or shortness of breath (cough, shortness of breath or wheezing.). 10/11/19   Robyn Haber, MD  amLODipine (NORVASC) 10 MG tablet Take 1 tablet (10 mg total) by mouth daily. 04/16/20   Minette Brine, FNP  aspirin 325 MG tablet Take 325 mg by mouth every 6 (six) hours as needed (pain).    [provider]  atorvastatin (LIPITOR) 10 MG tablet Take 1 tablet (10 mg total) by mouth at bedtime. 04/23/20 04/23/21  Minette Brine, FNP  B-D TB SYRINGE 1CC/27GX1/2" 27G X 1/2" 1 ML MISC USE 1 SYRINGE TO INJECT METHOTREXATE INTO THE SKIN ONCE A WEEK. 07/23/19   Bo Merino, MD  blood glucose meter kit and supplies KIT Dispense based on patient and insurance preference. Use up to two times daily as directed. (FOR ICD-9 250.00, 250.01). 01/17/20   Minette Brine, FNP  diazepam (VALIUM) 5 MG tablet Take 1  tablet (5 mg total) by mouth 2 (two) times daily. 08/21/20   Raylene Everts, MD  diphenhydrAMINE (BENADRYL) 25 MG tablet Take 25 mg by mouth every 6 (six) hours as needed for itching, allergies or sleep.    [provider]  DULoxetine (CYMBALTA) 60 MG capsule Take 60 mg by mouth daily.      [provider]  ezetimibe (ZETIA) 10 MG tablet Take 1 tablet (10 mg total) by mouth daily. 04/23/20   Minette Brine, FNP  folic acid (FOLVITE) 1 MG tablet Take 2 tablets (2 mg total) by mouth daily. 02/07/20   Ofilia Neas, PA-C  hydrochlorothiazide  (HYDRODIURIL) 12.5 MG tablet Take 1 tablet (12.5 mg total) by mouth daily as needed (fluid/edema). 04/16/20   Minette Brine, FNP  HYDROcodone-acetaminophen (NORCO) 10-325 MG tablet Take 1 tablet by mouth every 6 (six) hours. 08/20/19   [provider]  hydrOXYzine (ATARAX/VISTARIL) 25 MG tablet Take 1 tablet (25 mg total) by mouth every 8 (eight) hours as needed for itching. 05/15/20   Lamptey, Myrene Galas, MD  Lancets (ONETOUCH DELICA PLUS [id number]) MISC 2 (two) times daily. use for testing 01/17/20   [provider]  metFORMIN (GLUCOPHAGE) 500 MG tablet Take 1 tablet (500 mg total) by mouth 2 (two) times daily with a meal. 04/16/20   Minette Brine, FNP  methotrexate 50 MG/2ML injection Inject 0.8 mls (20 mg total) into the skin weekly. 02/07/20   Ofilia Neas, PA-C  metoprolol tartrate (LOPRESSOR) 50 MG tablet Take 1 tablet (50 mg total) by mouth 2 (two) times daily. Please schedule appointment for refills. 04/16/20   Minette Brine, FNP  mometasone (ELOCON) 0.1 % cream Apply to affected area daily 01/16/20 01/15/21  Minette Brine, FNP  [facility] VERIO test strip TEST TWICE DAILY 02/11/20   Minette Brine, FNP  tiZANidine (ZANAFLEX) 4 MG capsule Take 4 mg by mouth every 6 (six) hours. 10/15/19   [provider]    Family History Family History  Problem Relation Age of Onset  . Diabetes Mother   . Healthy Father   . Heart attack Maternal Grandmother   . Stroke Maternal Grandmother     Social History Social History   Tobacco Use  . Smoking status: Current Every Day Smoker    Packs/day: 0.50    Years: 20.00    Pack years: 10.00    Types: Cigarettes  . Smokeless tobacco: Never Used  Vaping Use  . Vaping Use: Never used  Substance Use Topics  . Alcohol use: Not Currently  . Drug use: Not Currently    Types: Hydrocodone     Allergies   Patient has no known allergies.   Review of Systems Review of Systems See HPI  Physical Exam Triage Vital Signs ED Triage  Vitals  Enc Vitals Group     BP 08/21/20 1539 (!) 155/98     Pulse Rate 08/21/20 1539 97     Resp 08/21/20 1539 19     Temp 08/21/20 1539 98.2 F (36.8 C)     Temp Source 08/21/20 1539 Oral     SpO2 08/21/20 1539 97 %     Weight --      Height --      Head Circumference --      Peak Flow --      Pain Score 08/21/20 1537 8     Pain Loc --      Pain Edu? --      Excl. in [place]? --  No data found.  Updated Vital Signs BP (!) 155/98 (BP Location: Left Arm)   Pulse 97   Temp 98.2 F (36.8 C) (Oral)   Resp 19   SpO2 97%     Physical Exam Constitutional:      General: She is not in acute distress.    Appearance: She is well-developed. She is obese.     Comments: Appears uncomfortable  HENT:     Head: Normocephalic and atraumatic.     Mouth/Throat:     Comments: Mask is in place Eyes:     Conjunctiva/sclera: Conjunctivae normal.     Pupils: Pupils are equal, round, and reactive to light.  Cardiovascular:     Rate and Rhythm: Normal rate.  Pulmonary:     Effort: Pulmonary effort is normal. No respiratory distress.  Abdominal:     General: There is no distension.     Palpations: Abdomen is soft.  Musculoskeletal:        General: Normal range of motion.     Cervical back: Normal range of motion.     Comments: Tenderness in the right upper body trapezius and on the medial border of the right scapula.  Increased muscle tone.  No spasm noted  Skin:    General: Skin is warm and dry.  Neurological:     Mental Status: She is alert.  Psychiatric:        Behavior: Behavior normal.      UC Treatments / Results  Labs (all labs ordered are listed, but only abnormal results are displayed) Labs Reviewed - No data to display  EKG   Radiology No results found.  Procedures Procedures (including critical care time)  Medications Ordered in UC Medications  ketorolac (TORADOL) injection 60 mg (60 mg Intramuscular Given 08/21/20 1620)    Initial Impression / Assessment  and Plan / UC Course  I have reviewed the triage vital signs and the nursing notes.  Pertinent labs & imaging results that were available during my care of the patient were reviewed by me and considered in my medical decision making (see chart for details).     *Patient initially indicated that she wanted stronger pain medication.  I explained to her that because of her pain contract I was unable to give her narcotics.  I did give her a shot of Toradol for pain.  I gave her diazepam as a muscle relaxer.  She is cautioned not to take diazepam with her hydrocodone.  She can use ice or heat.  Follow-up with her usual providers Final Clinical Impressions(s) / UC Diagnoses   Final diagnoses:  Musculoskeletal neck pain     Discharge Instructions     I am unable to give you stronger pain medication because of your narcotic contract I will prescribe diazepam as a muscle relaxer Be careful taking diazepam with hydrocodone because it can cause sedation (drowsiness) Use ice or heat to painful muscles Follow-up with your usual providers   ED Prescriptions    Medication Sig Dispense Auth. Provider   diazepam (VALIUM) 5 MG tablet Take 1 tablet (5 mg total) by mouth 2 (two) times daily. 10 tablet Raylene Everts, MD     I have reviewed the PDMP during this encounter.   Raylene Everts, MD 08/21/20 Karl Bales

## 2020-08-21 NOTE — ED Triage Notes (Signed)
Pt presents with mid back pain xs 2-3 days. States was worse this am.

## 2020-08-21 NOTE — Discharge Instructions (Addendum)
I am unable to give you stronger pain medication because of your narcotic contract I will prescribe diazepam as a muscle relaxer Be careful taking diazepam with hydrocodone because it can cause sedation (drowsiness) Use ice or heat to painful muscles Follow-up with your usual providers

## 2020-08-27 ENCOUNTER — Telehealth: Payer: Self-pay

## 2020-08-27 ENCOUNTER — Telehealth: Payer: Medicare Other

## 2020-08-27 NOTE — Telephone Encounter (Cosign Needed Addendum)
  Chronic Care Management   Outreach Note  08/27/2020 Name: Shelby Robertson MRN: [id number] DOB: 02-Feb-1971  Referred by: Minette Brine, FNP Reason for referral : Chronic Care Management (CCM RNCM FU Call )   An unsuccessful telephone outreach was attempted today. The patient was referred to the case management team for assistance with care management and care coordination.   Follow Up Plan: Telephone follow up appointment with care management team member scheduled for: 09/23/20  Barb Merino, RN, BSN, CCM Care Management Coordinator [facility]  Direct Phone: [phone]

## 2020-09-08 ENCOUNTER — Other Ambulatory Visit: Payer: Self-pay

## 2020-09-08 ENCOUNTER — Ambulatory Visit (HOSPITAL_COMMUNITY)
Admission: EM | Admit: 2020-09-08 | Discharge: 2020-09-08 | Disposition: A | Discharge status: Home / Self Care | Payer: Medicare Other | Attending: Family Medicine | Admitting: Family Medicine

## 2020-09-08 ENCOUNTER — Encounter (HOSPITAL_COMMUNITY): Payer: Self-pay

## 2020-09-08 DIAGNOSIS — S161XXA Strain of muscle, fascia and tendon at neck level, initial encounter: Secondary | ICD-10-CM

## 2020-09-08 MED ORDER — PREDNISONE 20 MG PO TABS: 40.0000 mg | ORAL_TABLET | Freq: Every day | ORAL | 0 refills | Status: DC

## 2020-09-08 NOTE — ED Triage Notes (Signed)
Pt in with c/o left neck pain that radiates down her shoulder and arm. States that the pain has been going on for 1 month.  Says "it feels like it's burning"  Pt has been using icy hot with no relief

## 2020-09-08 NOTE — ED Provider Notes (Signed)
[place]    CSN: [id number] Arrival date & time: 09/08/20  1714      History   Chief Complaint Chief Complaint  Patient presents with  . Neck Pain  . Arm Pain    HPI Shelby Robertson is a 49 y.o. female.   Presenting today with about a month of ongoing left sided neck pain that she states feels like a big muscle cramp all the way down to her left elbow. No known injury that started the pain. Some positions she is able to get the pain to stop but otherwise feels like the entire area is locked up like a muscle spasm. Denies sharp radiating pain, numbness or tingling down into hand, weakness of arm. No known history of neck injuries but does have hx of RA, fibromyalgia, and OA followed by [facility] (on pain contract for her hydrocodone). Also taking zanaflex, tylenol, NSAIDs, cymbalta and using lots of icy hot with only mild relief.      Past Medical History:  Diagnosis Date  . Fibromyalgia   . Fibromyalgia   . Hypertension   . Osteoarthritis   . Rheumatoid arthritis(714.0)   . Rheumatoid arthritis(714.0)     Patient Active Problem List   Diagnosis Date Noted  . Sepsis ([place]) 09/17/2019  . AMS (altered mental status) 09/17/2019  . Morbid obesity ([place]) 01/10/2019  . Prediabetes 01/10/2019  . Tobacco use disorder, continuous 01/10/2019  . Soft tissue swelling of back 01/10/2019  . Fibromyalgia 07/27/2017  . Other fatigue 07/27/2017  . History of hidradenitis suppurativa 07/27/2017  . History of hypertension 07/27/2017  . History of hyperlipidemia 07/27/2017  . Smoker 07/27/2017  . High risk medication use 08/21/2016  . Rheumatoid arthritis of multiple sites without rheumatoid factor ([place]) 08/12/2016  . ARTHRITIS, GENERALIZED 07/14/2010  . HYPERTENSION, BENIGN ESSENTIAL 03/04/2009  . POLYURIA 03/04/2009  . FIBROMYALGIA 11/09/2007  . HIDRADENITIS SUPPURATIVA 07/11/2007  . Mixed hyperlipidemia 06/28/2007  . LEG EDEMA, BILATERAL 05/05/2007     History reviewed. No pertinent surgical history.  OB History   No obstetric history on file.      Home Medications    Prior to Admission medications   Medication Sig Start Date End Date Taking? Authorizing Provider  HUMIRA PEN 40 MG/0.8ML PNKT INJECT 1 PEN INTO THE SKIN EVERY 14 DAYS 07/28/20   Bo Merino, MD  acetaminophen (TYLENOL) 650 MG CR tablet Take 1,300 mg by mouth every 8 (eight) hours as needed for pain.    [provider]  albuterol (PROVENTIL) (2.5 MG/3ML) 0.083% nebulizer solution Take 3 mLs (2.5 mg total) by nebulization every 6 (six) hours as needed for wheezing or shortness of breath. 10/11/19   Robyn Haber, MD  albuterol (VENTOLIN HFA) 108 (90 Base) MCG/ACT inhaler Inhale 2 puffs into the lungs every 4 (four) hours as needed for wheezing or shortness of breath (cough, shortness of breath or wheezing.). 10/11/19   Robyn Haber, MD  amLODipine (NORVASC) 10 MG tablet Take 1 tablet (10 mg total) by mouth daily. 04/16/20   Minette Brine, FNP  aspirin 325 MG tablet Take 325 mg by mouth every 6 (six) hours as needed (pain).    [provider]  atorvastatin (LIPITOR) 10 MG tablet Take 1 tablet (10 mg total) by mouth at bedtime. 04/23/20 04/23/21  Minette Brine, FNP  B-D TB SYRINGE 1CC/27GX1/2" 27G X 1/2" 1 ML MISC USE 1 SYRINGE TO INJECT METHOTREXATE INTO THE SKIN ONCE A WEEK. 07/23/19   Bo Merino,  MD  blood glucose meter kit and supplies KIT Dispense based on patient and insurance preference. Use up to two times daily as directed. (FOR ICD-9 250.00, 250.01). 01/17/20   Minette Brine, FNP  diazepam (VALIUM) 5 MG tablet Take 1 tablet (5 mg total) by mouth 2 (two) times daily. 08/21/20   Raylene Everts, MD  diphenhydrAMINE (BENADRYL) 25 MG tablet Take 25 mg by mouth every 6 (six) hours as needed for itching, allergies or sleep.    [provider]  DULoxetine (CYMBALTA) 60 MG capsule Take 60 mg by mouth daily.      [provider]  ezetimibe (ZETIA) 10 MG tablet Take 1 tablet (10 mg total) by mouth daily. 04/23/20   Minette Brine, FNP  folic acid (FOLVITE) 1 MG tablet Take 2 tablets (2 mg total) by mouth daily. 02/07/20   Ofilia Neas, PA-C  hydrochlorothiazide (HYDRODIURIL) 12.5 MG tablet Take 1 tablet (12.5 mg total) by mouth daily as needed (fluid/edema). 04/16/20   Minette Brine, FNP  HYDROcodone-acetaminophen (NORCO) 10-325 MG tablet Take 1 tablet by mouth every 6 (six) hours. 08/20/19   [provider]  hydrOXYzine (ATARAX/VISTARIL) 25 MG tablet Take 1 tablet (25 mg total) by mouth every 8 (eight) hours as needed for itching. 05/15/20   Lamptey, Myrene Galas, MD  Lancets (ONETOUCH DELICA PLUS [id number]) MISC 2 (two) times daily. use for testing 01/17/20   [provider]  metFORMIN (GLUCOPHAGE) 500 MG tablet Take 1 tablet (500 mg total) by mouth 2 (two) times daily with a meal. 04/16/20   Minette Brine, FNP  methotrexate 50 MG/2ML injection Inject 0.8 mls (20 mg total) into the skin weekly. 02/07/20   Ofilia Neas, PA-C  metoprolol tartrate (LOPRESSOR) 50 MG tablet Take 1 tablet (50 mg total) by mouth 2 (two) times daily. Please schedule appointment for refills. 04/16/20   Minette Brine, FNP  mometasone (ELOCON) 0.1 % cream Apply to affected area daily 01/16/20 01/15/21  Minette Brine, FNP  [facility] VERIO test strip TEST TWICE DAILY 02/11/20   Minette Brine, FNP  predniSONE (DELTASONE) 20 MG tablet Take 2 tablets (40 mg total) by mouth daily with breakfast. 09/08/20   Volney American, PA-C  tiZANidine (ZANAFLEX) 4 MG capsule Take 4 mg by mouth every 6 (six) hours. 10/15/19   [provider]    Family History Family History  Problem Relation Age of Onset  . Diabetes Mother   . Healthy Father   . Heart attack Maternal Grandmother   . Stroke Maternal Grandmother     Social History Social History   Tobacco Use  . Smoking status: Current Every Day Smoker    Packs/day: 0.50    Years: 20.00     Pack years: 10.00    Types: Cigarettes  . Smokeless tobacco: Never Used  Vaping Use  . Vaping Use: Never used  Substance Use Topics  . Alcohol use: Not Currently  . Drug use: Not Currently    Types: Hydrocodone     Allergies   Patient has no known allergies.   Review of Systems Review of Systems PER HPI   Physical Exam Triage Vital Signs ED Triage Vitals  Enc Vitals Group     BP 09/08/20 1844 (!) 144/92     Pulse Rate 09/08/20 1844 69     Resp 09/08/20 1844 19     Temp 09/08/20 1844 98.6 F (37 C)     Temp Source 09/08/20 1844 Oral  SpO2 09/08/20 1844 97 %     Weight --      Height --      Head Circumference --      Peak Flow --      Pain Score 09/08/20 1842 6     Pain Loc --      Pain Edu? --      Excl. in [place]? --    No data found.  Updated Vital Signs BP (!) 144/92 (BP Location: Right Arm)   Pulse 69   Temp 98.6 F (37 C) (Oral)   Resp 19   SpO2 97%   Visual Acuity Right Eye Distance:   Left Eye Distance:   Bilateral Distance:    Right Eye Near:   Left Eye Near:    Bilateral Near:     Physical Exam Vitals and nursing note reviewed.  Constitutional:      Appearance: Normal appearance. She is not ill-appearing.  HENT:     Head: Atraumatic.  Eyes:     Extraocular Movements: Extraocular movements intact.     Conjunctiva/sclera: Conjunctivae normal.  Cardiovascular:     Rate and Rhythm: Normal rate and regular rhythm.     Heart sounds: Normal heart sounds.  Pulmonary:     Effort: Pulmonary effort is normal.     Breath sounds: Normal breath sounds.  Musculoskeletal:        General: Tenderness present. Normal range of motion.     Cervical back: Normal range of motion and neck supple.     Comments: No c spine ttp or deformity on palpation Left trapezius and deltoid ttp with mild spasm Strength full and equal b/l UEs  Skin:    General: Skin is warm and dry.     Findings: No erythema.  Neurological:     Mental Status: She is alert and  oriented to person, place, and time.     Comments: B/l UEs neurovascularly intact  Psychiatric:        Mood and Affect: Mood normal.        Thought Content: Thought content normal.        Judgment: Judgment normal.      UC Treatments / Results  Labs (all labs ordered are listed, but only abnormal results are displayed) Labs Reviewed - No data to display  EKG   Radiology No results found.  Procedures Procedures (including critical care time)  Medications Ordered in UC Medications - No data to display  Initial Impression / Assessment and Plan / UC Course  I have reviewed the triage vital signs and the nursing notes.  Pertinent labs & imaging results that were available during my care of the patient were reviewed by me and considered in my medical decision making (see chart for details).     No evidence of c spine injury or radiculopathy, suspect muscular in nature. Already on numerous types of pain medication, muscle relaxer - will cautiously use short prednisone burst with close monitoring of BSs to help ease her pain.   Final Clinical Impressions(s) / UC Diagnoses   Final diagnoses:  Acute strain of neck muscle, initial encounter   Discharge Instructions   None    ED Prescriptions    Medication Sig Dispense Auth. Provider   predniSONE (DELTASONE) 20 MG tablet Take 2 tablets (40 mg total) by mouth daily with breakfast. 10 tablet Volney American, [place]     PDMP not reviewed this encounter.   Volney American, [place] 09/08/20 1933

## 2020-09-15 MED FILL — HUMIRA PEN 40 MG/0.8ML PNKT: 40 | 28 days supply | Qty: 2 | Fill #1

## 2020-09-16 ENCOUNTER — Telehealth: Payer: Self-pay

## 2020-09-16 NOTE — Telephone Encounter (Signed)
[facility] LVM pt would like home care services returned call to [place] [phone] she was unavailable

## 2020-09-18 ENCOUNTER — Telehealth: Payer: Self-pay

## 2020-09-18 NOTE — Telephone Encounter (Signed)
We received a PCS form for pt but we are unable to fill it out at this time due to the patient not being seen within the last 90 days. I have reached out to the pt to schedule an appointment I was unable to leave her a v/m. YL,RMA

## 2020-09-23 ENCOUNTER — Telehealth: Payer: Self-pay

## 2020-09-23 ENCOUNTER — Telehealth: Payer: Medicare Other

## 2020-09-23 NOTE — Telephone Encounter (Signed)
Spoke with [employer]. Patient needs appointment.

## 2020-09-23 NOTE — Telephone Encounter (Cosign Needed)
  Chronic Care Management   Outreach Note  09/23/2020 Name: Shelby Robertson MRN: [id number] DOB: [birth year]  Referred by: Minette Brine, FNP Reason for referral : Chronic Care Management (#2 RNCM FU Call )   Shelby Robertson is enrolled in a Managed Medicaid Health Plan: No  A second unsuccessful telephone outreach was attempted today. The patient was referred to the case management team for assistance with care management and care coordination.   Follow Up Plan: Telephone follow up appointment with care management team member scheduled for: 11/03/20  Barb Merino, RN, BSN, CCM Care Management Coordinator [facility]  Direct Phone: [phone]

## 2020-09-28 ENCOUNTER — Other Ambulatory Visit: Payer: Self-pay | Admitting: Nurse Practitioner

## 2020-10-06 ENCOUNTER — Telehealth: Payer: Self-pay | Admitting: Pharmacist

## 2020-10-06 NOTE — Telephone Encounter (Signed)
Received notification from [facility] regarding a prior authorization for HUMIRA 40mg /0.18mL. Authorization has been APPROVED from 10/02/20 to 10/17/21.   Authorization # [id number] Phone # [phone]  Knox Saliva, PharmD, MPH Clinical Pharmacist (Rheumatology and Pulmonology)

## 2020-10-14 ENCOUNTER — Ambulatory Visit (INDEPENDENT_AMBULATORY_CARE_PROVIDER_SITE_OTHER): Payer: Medicare Other | Admitting: Nurse Practitioner

## 2020-10-14 ENCOUNTER — Other Ambulatory Visit: Payer: Self-pay

## 2020-10-14 ENCOUNTER — Encounter: Payer: Self-pay | Admitting: Nurse Practitioner

## 2020-10-14 VITALS — BP 156/92 | HR 110 | Temp 97.6°F | Ht 66.0 in | Wt 294.0 lb

## 2020-10-14 DIAGNOSIS — E1169 Type 2 diabetes mellitus with other specified complication: Secondary | ICD-10-CM

## 2020-10-14 DIAGNOSIS — Z6841 Body Mass Index (BMI) 40.0 and over, adult: Secondary | ICD-10-CM

## 2020-10-14 DIAGNOSIS — E782 Mixed hyperlipidemia: Secondary | ICD-10-CM | POA: Diagnosis not present

## 2020-10-14 DIAGNOSIS — Z23 Encounter for immunization: Secondary | ICD-10-CM | POA: Diagnosis not present

## 2020-10-14 DIAGNOSIS — M797 Fibromyalgia: Secondary | ICD-10-CM

## 2020-10-14 DIAGNOSIS — R600 Localized edema: Secondary | ICD-10-CM

## 2020-10-14 DIAGNOSIS — I10 Essential (primary) hypertension: Secondary | ICD-10-CM

## 2020-10-14 DIAGNOSIS — E118 Type 2 diabetes mellitus with unspecified complications: Secondary | ICD-10-CM | POA: Insufficient documentation

## 2020-10-14 MED ORDER — HYDROCHLOROTHIAZIDE 12.5 MG PO TABS
12.5000 mg | ORAL_TABLET | Freq: Every day | ORAL | 1 refills | Status: DC | PRN
Start: 2020-10-14 — End: 2021-03-27

## 2020-10-14 MED ORDER — EZETIMIBE 10 MG PO TABS: 10.0000 mg | ORAL_TABLET | Freq: Every day | ORAL | 1 refills | Status: DC

## 2020-10-14 MED ORDER — METOPROLOL TARTRATE 50 MG PO TABS: 50.0000 mg | ORAL_TABLET | Freq: Two times a day (BID) | ORAL | 1 refills | Status: DC

## 2020-10-14 MED ORDER — METFORMIN HCL 500 MG PO TABS: 500.0000 mg | ORAL_TABLET | Freq: Two times a day (BID) | ORAL | 1 refills | Status: DC

## 2020-10-14 NOTE — Patient Instructions (Addendum)
Diabetes Mellitus and Foot Care Foot care is an important part of your health, especially when you have diabetes. Diabetes may cause you to have problems because of poor blood flow (circulation) to your feet and legs, which can cause your skin to:  Become thinner and drier.  Break more easily.  Heal more slowly.  Peel and crack. You may also have nerve damage (neuropathy) in your legs and feet, causing decreased feeling in them. This means that you may not notice minor injuries to your feet that could lead to more serious problems. Noticing and addressing any potential problems early is the best way to prevent future foot problems. How to care for your feet Foot hygiene  Wash your feet daily with warm water and mild soap. Do not use hot water. Then, pat your feet and the areas between your toes until they are completely dry. Do not soak your feet as this can dry your skin.  Trim your toenails straight across. Do not dig under them or around the cuticle. File the edges of your nails with an emery board or nail file.  Apply a moisturizing lotion or petroleum jelly to the skin on your feet and to dry, brittle toenails. Use lotion that does not contain alcohol and is unscented. Do not apply lotion between your toes. Shoes and socks  Wear clean socks or stockings every day. Make sure they are not too tight. Do not wear knee-high stockings since they may decrease blood flow to your legs.  Wear shoes that fit properly and have enough cushioning. Always look in your shoes before you put them on to be sure there are no objects inside.  To break in new shoes, wear them for just a few hours a day. This prevents injuries on your feet. Wounds, scrapes, corns, and calluses  Check your feet daily for blisters, cuts, bruises, sores, and redness. If you cannot see the bottom of your feet, use a mirror or ask someone for help.  Do not cut corns or calluses or try to remove them with medicine.  If you  find a minor scrape, cut, or break in the skin on your feet, keep it and the skin around it clean and dry. You may clean these areas with mild soap and water. Do not clean the area with peroxide, alcohol, or iodine.  If you have a wound, scrape, corn, or callus on your foot, look at it several times a day to make sure it is healing and not infected. Check for: ? Redness, swelling, or pain. ? Fluid or blood. ? Warmth. ? Pus or a bad smell. General instructions  Do not cross your legs. This may decrease blood flow to your feet.  Do not use heating pads or hot water bottles on your feet. They may burn your skin. If you have lost feeling in your feet or legs, you may not know this is happening until it is too late.  Protect your feet from hot and cold by wearing shoes, such as at the beach or on hot pavement.  Schedule a complete foot exam at least once a year (annually) or more often if you have foot problems. If you have foot problems, report any cuts, sores, or bruises to your health care provider immediately. Contact a health care provider if:  You have a medical condition that increases your risk of infection and you have any cuts, sores, or bruises on your feet.  You have an injury that is not   healing.  You have redness on your legs or feet.  You feel burning or tingling in your legs or feet.  You have pain or cramps in your legs and feet.  Your legs or feet are numb.  Your feet always feel cold.  You have pain around a toenail. Get help right away if:  You have a wound, scrape, corn, or callus on your foot and: ? You have pain, swelling, or redness that gets worse. ? You have fluid or blood coming from the wound, scrape, corn, or callus. ? Your wound, scrape, corn, or callus feels warm to the touch. ? You have pus or a bad smell coming from the wound, scrape, corn, or callus. ? You have a fever. ? You have a red line going up your leg. Summary  Check your feet every day  for cuts, sores, red spots, swelling, and blisters.  Moisturize feet and legs daily.  Wear shoes that fit properly and have enough cushioning.  If you have foot problems, report any cuts, sores, or bruises to your health care provider immediately.  Schedule a complete foot exam at least once a year (annually) or more often if you have foot problems. This information is not intended to replace advice given to you by your health care provider. Make sure you discuss any questions you have with your health care provider. Document Revised: 06/27/2019 Document Reviewed: 11/05/2016 Elsevier Patient Education  2020 Elsevier Inc.  

## 2020-10-14 NOTE — Progress Notes (Signed)
I,Katawbba Wiggins,acting as a Education administrator for [employer], FNP.,have documented all relevant documentation on the behalf of Minette Brine, FNP,as directed by  Minette Brine, FNP while in the presence of Minette Brine, [place].  This visit occurred during the SARS-CoV-2 public health emergency.  Safety protocols were in place, including screening questions prior to the visit, additional usage of staff PPE, and extensive cleaning of exam room while observing appropriate contact time as indicated for disinfecting solutions.  Subjective:     Patient ID: Shelby Robertson , female    DOB: 10-Feb-1971 , 49 y.o.   MRN: [id number]   Chief Complaint  Patient presents with  . Hypertension    HPI  The patient is here today for a follow-up on her blood pressure, prediabetes, and cholesterol. She has not taken her medication for today.    She has needed assistance with her washing dishes, light house cleaning. She had significant swelling and pain to her lower extremities.    Hypertension This is a chronic problem. The current episode started more than 1 year ago. The problem is unchanged. The problem is uncontrolled. Pertinent negatives include no anxiety, chest pain, headaches, palpitations or shortness of breath. There are no associated agents to hypertension. Risk factors for coronary artery disease include obesity and sedentary lifestyle. There is no history of angina. There is no history of chronic renal disease.     Past Medical History:  Diagnosis Date  . Fibromyalgia   . Fibromyalgia   . Hypertension   . Osteoarthritis   . Rheumatoid arthritis(714.0)   . Rheumatoid arthritis(714.0)      Family History  Problem Relation Age of Onset  . Diabetes Mother   . Healthy Father   . Heart attack Maternal Grandmother   . Stroke Maternal Grandmother      Current Outpatient Medications:  .  acetaminophen (TYLENOL) 650 MG CR tablet, Take 1,300 mg by mouth every 8 (eight) hours as needed for pain., Disp:  , Rfl:  .  albuterol (PROVENTIL) (2.5 MG/3ML) 0.083% nebulizer solution, Take 3 mLs (2.5 mg total) by nebulization every 6 (six) hours as needed for wheezing or shortness of breath., Disp: 75 mL, Rfl: 12 .  albuterol (VENTOLIN HFA) 108 (90 Base) MCG/ACT inhaler, Inhale 2 puffs into the lungs every 4 (four) hours as needed for wheezing or shortness of breath (cough, shortness of breath or wheezing.)., Disp: 6.7 g, Rfl: 4 .  amLODipine (NORVASC) 10 MG tablet, Take 1 tablet (10 mg total) by mouth daily., Disp: 90 tablet, Rfl: 1 .  aspirin 325 MG tablet, Take 325 mg by mouth every 6 (six) hours as needed (pain)., Disp: , Rfl:  .  atorvastatin (LIPITOR) 10 MG tablet, TAKE 1 TABLET BY MOUTH EVERYDAY AT BEDTIME, Disp: 90 tablet, Rfl: 0 .  B-D TB SYRINGE 1CC/27GX1/2" 27G X 1/2" 1 ML MISC, USE 1 SYRINGE TO INJECT METHOTREXATE INTO THE SKIN ONCE A WEEK., Disp: 12 each, Rfl: 3 .  blood glucose meter kit and supplies KIT, Dispense based on patient and insurance preference. Use up to two times daily as directed. (FOR ICD-9 250.00, 250.01)., Disp: 1 each, Rfl: 0 .  diphenhydrAMINE (BENADRYL) 25 MG tablet, Take 25 mg by mouth every 6 (six) hours as needed for itching, allergies or sleep., Disp: , Rfl:  .  DULoxetine (CYMBALTA) 60 MG capsule, Take 60 mg by mouth daily., Disp: , Rfl:  .  folic acid (FOLVITE) 1 MG tablet, Take 2 tablets (2 mg total) by mouth daily.,  Disp: 180 tablet, Rfl: 3 .  HUMIRA PEN 40 MG/0.8ML PNKT, INJECT 1 PEN INTO THE SKIN EVERY 14 DAYS, Disp: 6 each, Rfl: 0 .  HYDROcodone-acetaminophen (NORCO) 10-325 MG tablet, Take 1 tablet by mouth every 6 (six) hours., Disp: , Rfl:  .  Lancets (ONETOUCH DELICA PLUS [id number]) MISC, 2 (two) times daily. use for testing, Disp: , Rfl:  .  methotrexate 50 MG/2ML injection, Inject 0.8 mls (20 mg total) into the skin weekly., Disp: 2 mL, Rfl:  .  mometasone (ELOCON) 0.1 % cream, Apply to affected area daily, Disp: 45 g, Rfl: 1 .  ONETOUCH VERIO test strip, TEST  TWICE DAILY, Disp: 100 strip, Rfl: 3 .  tiZANidine (ZANAFLEX) 4 MG capsule, Take 4 mg by mouth every 6 (six) hours., Disp: , Rfl:  .  ezetimibe (ZETIA) 10 MG tablet, Take 1 tablet (10 mg total) by mouth daily., Disp: 90 tablet, Rfl: 1 .  hydrochlorothiazide (HYDRODIURIL) 12.5 MG tablet, Take 1 tablet (12.5 mg total) by mouth daily as needed (fluid/edema)., Disp: 90 tablet, Rfl: 1 .  hydrOXYzine (ATARAX/VISTARIL) 25 MG tablet, Take 1 tablet (25 mg total) by mouth every 8 (eight) hours as needed for itching. (Patient not taking: Reported on 10/14/2020), Disp: 30 tablet, Rfl: 0 .  metFORMIN (GLUCOPHAGE) 500 MG tablet, Take 1 tablet (500 mg total) by mouth 2 (two) times daily with a meal., Disp: 180 tablet, Rfl: 1 .  metoprolol tartrate (LOPRESSOR) 50 MG tablet, Take 1 tablet (50 mg total) by mouth 2 (two) times daily. Please schedule appointment for refills., Disp: 180 tablet, Rfl: 1   No Known Allergies   Review of Systems  Constitutional: Negative.   Respiratory: Negative.  Negative for shortness of breath and wheezing.   Cardiovascular: Negative.  Negative for chest pain, palpitations and leg swelling.  Gastrointestinal: Negative.   Neurological: Negative for dizziness and headaches.  Psychiatric/Behavioral: Negative.   All other systems reviewed and are negative.    Today's Vitals   10/14/20 1220  BP: (!) 156/92  Pulse: (!) 110  Temp: 97.6 F (36.4 C)  TempSrc: Oral  Weight: 294 lb (133.4 kg)  Height: 5' 6"  (1.676 m)   Body mass index is 47.45 kg/m.   Objective:  Physical Exam Vitals and nursing note reviewed.  Constitutional:      General: She is not in acute distress.    Appearance: Normal appearance. She is obese.  HENT:     Head: Normocephalic and atraumatic.  Cardiovascular:     Rate and Rhythm: Normal rate and regular rhythm.     Pulses: Normal pulses.     Heart sounds: Normal heart sounds. No murmur heard.   Pulmonary:     Effort: Pulmonary effort is normal. No  respiratory distress.     Breath sounds: Normal breath sounds. No wheezing.  Skin:    General: Skin is warm.  Neurological:     General: No focal deficit present.     Mental Status: She is alert and oriented to person, place, and time.     Cranial Nerves: No cranial nerve deficit.  Psychiatric:        Mood and Affect: Mood normal.        Behavior: Behavior normal.        Thought Content: Thought content normal.        Judgment: Judgment normal.         Assessment And Plan:     1. Essential hypertension  Chronic, elevated today she  has been without her medications for several days which can contribute to her elevated blood pressure today.  - metoprolol tartrate (LOPRESSOR) 50 MG tablet; Take 1 tablet (50 mg total) by mouth 2 (two) times daily. Please schedule appointment for refills.  Dispense: 180 tablet; Refill: 1 - CMP14+EGFR  2. Mixed hyperlipidemia  Chronic, stable  Continue with current medications and avoiding fatty foods - ezetimibe (ZETIA) 10 MG tablet; Take 1 tablet (10 mg total) by mouth daily.  Dispense: 90 tablet; Refill: 1 - Lipid panel  3. Controlled type 2 diabetes mellitus with complication, without long-term current use of insulin (HCC)  Stable with metformin and diet - metFORMIN (GLUCOPHAGE) 500 MG tablet; Take 1 tablet (500 mg total) by mouth 2 (two) times daily with a meal.  Dispense: 180 tablet; Refill: 1 - CMP14+EGFR - Hemoglobin A1c  4. Localized edema  Bilateral feet currently have trace edema  Continue with HCTZ daily as needed - hydrochlorothiazide (HYDRODIURIL) 12.5 MG tablet; Take 1 tablet (12.5 mg total) by mouth daily as needed (fluid/edema).  Dispense: 90 tablet; Refill: 1  5. Class 3 severe obesity due to excess calories without serious comorbidity with body mass index (BMI) of 45.0 to 49.9 in adult [facility]) Chronic Discussed healthy diet and regular exercise options  Encouraged to exercise at least 150 minutes per week with 2 days of  strength training  6. Need for vaccination  Influenza vaccine administered  Encouraged to take Tylenol as needed for fever or muscle aches. - Flu Vaccine QUAD 6+ mos PF IM (Fluarix Quad PF)   7. Fibromyalgia  She has intermittent episodes of fibromyalgia flare which causes her to have difficulty with performing her ADLs to include light house work   She is to schedule a HM with Physical.    Patient was given opportunity to ask questions. Patient verbalized understanding of the plan and was able to repeat key elements of the plan. All questions were answered to their satisfaction.  Minette Brine, FNP   I, Minette Brine, FNP, have reviewed all documentation for this visit. The documentation on 10/26/20 for the exam, diagnosis, procedures, and orders are all accurate and complete.   THE PATIENT IS ENCOURAGED TO PRACTICE SOCIAL DISTANCING DUE TO THE COVID-19 PANDEMIC.

## 2020-10-15 LAB — LIPID PANEL
Chol/HDL Ratio: 7.2 ratio — ABNORMAL HIGH (ref 0.0–4.4)
Cholesterol, Total: 244 mg/dL — ABNORMAL HIGH (ref 100–199)
HDL: 34 mg/dL — ABNORMAL LOW (ref >39)
LDL Chol Calc (NIH): 150 mg/dL — ABNORMAL HIGH (ref 0–99)
Triglycerides: 322 mg/dL — ABNORMAL HIGH (ref 0–149)
VLDL Cholesterol Cal: 60 mg/dL — ABNORMAL HIGH (ref 5–40)

## 2020-10-15 LAB — HEMOGLOBIN A1C
Est. average glucose Bld gHb Est-mCnc: 131 mg/dL
Hgb A1c MFr Bld: 6.2 % — ABNORMAL HIGH (ref 4.8–5.6)

## 2020-10-15 LAB — CMP14+EGFR
ALT: 11 IU/L (ref 0–32)
AST: 11 IU/L (ref 0–40)
Albumin/Globulin Ratio: 1.2 (ref 1.2–2.2)
Albumin: 4.3 g/dL (ref 3.8–4.8)
Alkaline Phosphatase: 94 IU/L (ref 44–121)
BUN/Creatinine Ratio: 9 (ref 9–23)
BUN: 6 mg/dL (ref 6–24)
Bilirubin Total: <0.2 mg/dL (ref 0.0–1.2)
CO2: 23 mmol/L (ref 20–29)
Calcium: 9.6 mg/dL (ref 8.7–10.2)
Chloride: 104 mmol/L (ref 96–106)
Creatinine, Ser: 0.65 mg/dL (ref 0.57–1.00)
GFR calc Af Amer: 122 mL/min/{1.73_m2} (ref >59)
GFR calc non Af Amer: 106 mL/min/{1.73_m2} (ref >59)
Globulin, Total: 3.5 g/dL (ref 1.5–4.5)
Glucose: 104 mg/dL — ABNORMAL HIGH (ref 65–99)
Potassium: 4.4 mmol/L (ref 3.5–5.2)
Sodium: 139 mmol/L (ref 134–144)
Total Protein: 7.8 g/dL (ref 6.0–8.5)

## 2020-10-20 NOTE — Progress Notes (Deleted)
Office Visit Note  Patient: Shelby Robertson             Date of Birth: Jun 26, 1971           MRN: [id number]             PCP: Arnette Felts, FNP Referring: Arnette Felts, FNP Visit Date: 10/21/2020 Occupation: @GUAROCC @  Subjective:  No chief complaint on file.   History of Present Illness: Shelby Robertson is a 50 y.o. female ***   Activities of Daily Living:  Patient reports morning stiffness for *** {minute/hour:19697}.   Patient {ACTIONS;DENIES/REPORTS:21021675::"Denies"} nocturnal pain.  Difficulty dressing/grooming: {ACTIONS;DENIES/REPORTS:21021675::"Denies"} Difficulty climbing stairs: {ACTIONS;DENIES/REPORTS:21021675::"Denies"} Difficulty getting out of chair: {ACTIONS;DENIES/REPORTS:21021675::"Denies"} Difficulty using hands for taps, buttons, cutlery, and/or writing: {ACTIONS;DENIES/REPORTS:21021675::"Denies"}  No Rheumatology ROS completed.   PMFS History:  Patient Active Problem List   Diagnosis Date Noted  . Controlled type 2 diabetes mellitus with complication, without long-term current use of insulin (HCC) 10/14/2020  . Sepsis (HCC) 09/17/2019  . AMS (altered mental status) 09/17/2019  . Morbid obesity (HCC) 01/10/2019  . Prediabetes 01/10/2019  . Tobacco use disorder, continuous 01/10/2019  . Soft tissue swelling of back 01/10/2019  . Fibromyalgia 07/27/2017  . Other fatigue 07/27/2017  . History of hidradenitis suppurativa 07/27/2017  . History of hypertension 07/27/2017  . History of hyperlipidemia 07/27/2017  . Smoker 07/27/2017  . High risk medication use 08/21/2016  . Rheumatoid arthritis of multiple sites without rheumatoid factor (HCC) 08/12/2016  . ARTHRITIS, GENERALIZED 07/14/2010  . HYPERTENSION, BENIGN ESSENTIAL 03/04/2009  . POLYURIA 03/04/2009  . FIBROMYALGIA 11/09/2007  . HIDRADENITIS SUPPURATIVA 07/11/2007  . Mixed hyperlipidemia 06/28/2007  . LEG EDEMA, BILATERAL 05/05/2007    Past Medical History:  Diagnosis Date  . Fibromyalgia    . Fibromyalgia   . Hypertension   . Osteoarthritis   . Rheumatoid arthritis(714.0)   . Rheumatoid arthritis(714.0)     Family History  Problem Relation Age of Onset  . Diabetes Mother   . Healthy Father   . Heart attack Maternal Grandmother   . Stroke Maternal Grandmother    No past surgical history on file. Social History   Social History Narrative  . Not on file   Immunization History  Administered Date(s) Administered  . Influenza,inj,Quad PF,6+ Mos 09/18/2019, 10/14/2020  . Influenza-Unspecified 07/18/2014  . PFIZER SARS-COV-2 Vaccination 02/11/2020, 03/03/2020  . Td 11/11/2008     Objective: Vital Signs: There were no vitals taken for this visit.   Physical Exam   Musculoskeletal Exam: ***  CDAI Exam: CDAI Score: -- Patient Global: --; Provider Global: -- Swollen: --; Tender: -- Joint Exam 10/21/2020   No joint exam has been documented for this visit   There is currently no information documented on the homunculus. Go to the Rheumatology activity and complete the homunculus joint exam.  Investigation: No additional findings.  Imaging: No results found.  Recent Labs: Lab Results  Component Value Date   WBC 7.0 07/10/2020   HGB 13.4 07/10/2020   PLT 396 07/10/2020   NA 139 10/14/2020   K 4.4 10/14/2020   CL 104 10/14/2020   CO2 23 10/14/2020   GLUCOSE 104 (H) 10/14/2020   BUN 6 10/14/2020   CREATININE 0.65 10/14/2020   BILITOT <0.2 10/14/2020   ALKPHOS 94 10/14/2020   AST 11 10/14/2020   ALT 11 10/14/2020   PROT 7.8 10/14/2020   ALBUMIN 4.3 10/14/2020   CALCIUM 9.6 10/14/2020   GFRAA 122 10/14/2020   QFTBGOLDPLUS NEGATIVE 07/10/2020  Speciality Comments: No specialty comments available.  Procedures:  No procedures performed Allergies: Patient has no known allergies.   Assessment / Plan:     Visit Diagnoses: No diagnosis found.  Orders: No orders of the defined types were placed in this encounter.  No orders of the defined  types were placed in this encounter.   Face-to-face time spent with patient was *** minutes. Greater than 50% of time was spent in counseling and coordination of care.  Follow-Up Instructions: No follow-ups on file.   Earnestine Mealing, CMA  Note - This record has been created using Editor, commissioning.  Chart creation errors have been sought, but may not always  have been located. Such creation errors do not reflect on  the standard of medical care.

## 2020-10-21 ENCOUNTER — Ambulatory Visit: Payer: Medicare Other | Admitting: Rheumatology

## 2020-10-21 DIAGNOSIS — R5383 Other fatigue: Secondary | ICD-10-CM

## 2020-10-21 DIAGNOSIS — F17200 Nicotine dependence, unspecified, uncomplicated: Secondary | ICD-10-CM

## 2020-10-21 DIAGNOSIS — Z8639 Personal history of other endocrine, nutritional and metabolic disease: Secondary | ICD-10-CM

## 2020-10-21 DIAGNOSIS — M0609 Rheumatoid arthritis without rheumatoid factor, multiple sites: Secondary | ICD-10-CM

## 2020-10-21 DIAGNOSIS — M170 Bilateral primary osteoarthritis of knee: Secondary | ICD-10-CM

## 2020-10-21 DIAGNOSIS — M797 Fibromyalgia: Secondary | ICD-10-CM

## 2020-10-21 DIAGNOSIS — Z872 Personal history of diseases of the skin and subcutaneous tissue: Secondary | ICD-10-CM

## 2020-10-21 DIAGNOSIS — Z8679 Personal history of other diseases of the circulatory system: Secondary | ICD-10-CM

## 2020-10-21 DIAGNOSIS — Z79899 Other long term (current) drug therapy: Secondary | ICD-10-CM

## 2020-10-24 NOTE — Progress Notes (Signed)
Virtual Visit via Telephone Note  I connected with Shelby Robertson on 11/04/20 at  2:00 PM EST by telephone and verified that I am speaking with the correct person using two identifiers.  Location: Patient: Home  Provider: Clinic   This service was conducted via virtual visit. The patient was located at home. I was located in my office.  Consent was obtained prior to the virtual visit and is aware of possible charges through their insurance for this visit.  The patient is an established patient.  Dr. Estanislado Pandy, MD conducted the virtual visit and Hazel Sams, PA-C acted as scribe during the service.  Office staff helped with scheduling follow up visits after the service was conducted.   I discussed the limitations, risks, security and privacy concerns of performing an evaluation and management service by telephone and the availability of in person appointments. I also discussed with the patient that there may be a patient responsible charge related to this service. The patient expressed understanding and agreed to proceed.  CC: Medication monitoring  History of Present Illness: Patient is a 50 year old female with a past medical history of seropositive rheumatoid arthritis, osteoarthritis, and fibromyalgia.  She is on methotrexate 0.8 ml sq injections once weekly, folic acid 2 mg po daily, and Humira 40 mg sq injections every 14 days.  She had a flare in the right foot on 10/29/20 and was evaluated in the ED.  She took prednisone 40 mg for 5 days, which resolved her symptoms.  She denies any joint pain or joint swelling at this time.  She is not having any discomfort in her knees at this time. She denies any recent fibromyalgia flares. She has not had any recent infections.    Review of Systems  Constitutional: Positive for malaise/fatigue.  HENT: Negative for congestion.   Eyes: Negative for pain.  Respiratory: Negative for shortness of breath.   Cardiovascular: Positive for leg swelling.   Gastrointestinal: Negative for constipation.  Genitourinary: Positive for frequency.  Musculoskeletal: Negative for joint pain.  Skin: Negative for rash.  Neurological: Positive for weakness.  Endo/Heme/Allergies: Does not bruise/bleed easily.  Psychiatric/Behavioral: The patient has insomnia.       Observations/Objective: Physical Exam Neurological:     Mental Status: She is alert and oriented to person, place, and time.  Psychiatric:        Mood and Affect: Mood and affect normal.        Cognition and Memory: Memory normal.        Judgment: Judgment normal.    .Patient reports morning stiffness for 20 minutes.   Patient reports nocturnal pain.  Difficulty dressing/grooming: Reports Difficulty climbing stairs: Reports Difficulty getting out of chair: Denies Difficulty using hands for taps, buttons, cutlery, and/or writing: Reports   Assessment and Plan: Visit Diagnoses: Rheumatoid arthritis of multiple sites without rheumatoid factor (HCC)-She was evaluated in the ED on 10/29/20 for right foot pain due to a rheumatoid arthritis flare.  She was treated with prednisone 40 mg daily for 5 days. Her symptoms resolved after starting on prednisone.  She has not had any other recent RA flares.  She remains on Humira 40 mg sq injections once every 14 days, MTX 0.8 ml sq injections once weekly, and folic acid 2 mg by mouth daily.  She is not experiencing any joint pain or joint swelling at this time.  Her morning stiffness continues to last about 20 minutes daily . She has no difficulty with ADLs at this time.  She will continue on the current treatment regimen. She was advised to notify us if she develops increased joint pain or joint swelling.  She will follow up in 3 months.    High risk medication use - Humira 40 mg sq once every 14 days, MTX 0.8 ml sq once weekly, and folic acid 2 mg daily.  CMP updated on 10/14/20.  CBC WNL on 07/10/20. She is due to update CBC. She plans on upcoming CBC  today.  Her next lab work will be due in April and every 3 months. Standing orders for CBC and CMP are in place. TB gold negative on 07/10/20 and will continue to be monitored yearly.   She has not had any recent infections. She was strongly encouraged to receive the covid-19 vaccine booster dose.  She was advised to hold MTX 1 week after receiving the booster dose.   Fibromyalgia-She has not had any recent fibromyalgia flares.  She will continue taking cymbalta 60 mg 1 capsule by mouth daily.   Other fatigue-Improved.  Primary osteoarthritis of both knees-She is not having any knee joint pain or joint swelling at this time.   Other medical conditions are listed as follows:   History of hidradenitis suppurativa-No recurrence of episodes since last visit.  History of hypertension  History of hyperlipidemia  Smoker   Follow Up Instructions: She will follow up in 3 months.    I discussed the assessment and treatment plan with the patient. The patient was provided an opportunity to ask questions and all were answered. The patient agreed with the plan and demonstrated an understanding of the instructions.   The patient was advised to call back or seek an in-person evaluation if the symptoms worsen or if the condition fails to improve as anticipated.  I provided 20 minutes of non-face-to-face time during this encounter.  Scribed byHazel Sams, PA-C  I reviewed the above note and attest to the accuracy of the document.   Bo Merino, MD

## 2020-10-28 MED FILL — HUMIRA PEN 40 MG/0.8ML PNKT: 40 | 28 days supply | Qty: 2 | Fill #2

## 2020-10-29 ENCOUNTER — Other Ambulatory Visit: Payer: Self-pay

## 2020-10-29 ENCOUNTER — Ambulatory Visit (HOSPITAL_COMMUNITY)
Admission: EM | Admit: 2020-10-29 | Discharge: 2020-10-29 | Disposition: A | Discharge status: Home / Self Care | Payer: Medicare Other | Attending: Family Medicine | Admitting: Family Medicine

## 2020-10-29 ENCOUNTER — Encounter (HOSPITAL_COMMUNITY): Payer: Self-pay

## 2020-10-29 DIAGNOSIS — M1990 Unspecified osteoarthritis, unspecified site: Secondary | ICD-10-CM

## 2020-10-29 DIAGNOSIS — M79671 Pain in right foot: Secondary | ICD-10-CM | POA: Diagnosis not present

## 2020-10-29 MED ORDER — PREDNISONE 20 MG PO TABS: 40.0000 mg | ORAL_TABLET | Freq: Every day | ORAL | 0 refills | Status: DC

## 2020-10-29 NOTE — ED Triage Notes (Signed)
Pt presents with right foot & ankle inflammation X 3 days; pt states she has arthritis and fibromyalgia.

## 2020-11-01 NOTE — ED Provider Notes (Signed)
[place]   [id number] 10/29/20 Arrival Time: 6270  ASSESSMENT & PLAN:  1. Foot pain, right   2. Arthritis     No indication for imaging.  Begin below. Reports has used before for temp relief. Plans f/u with her rheumatologist.  Meds ordered this encounter  Medications  . predniSONE (DELTASONE) 20 MG tablet    Sig: Take 2 tablets (40 mg total) by mouth daily.    Dispense:  10 tablet    Refill:  0    Recommend:  Follow-up Information    Schedule an appointment as soon as possible for a visit  with Minette Brine, [place].   Specialty: General Practice Contact information: [address] [place] [address] [phone]               Reviewed expectations re: course of current medical issues. Questions answered. Outlined signs and symptoms indicating need for more acute intervention. Patient verbalized understanding. After Visit Summary given.   SUBJECTIVE: History from: patient. Shelby Robertson is a 50 y.o. female who reports right foot/ankle swelling; acute on chronic arthritis exacerbation. No injury. Has used prednisone in the past and requests. Otherwise well.  History reviewed. No pertinent surgical history.    OBJECTIVE:  Vitals:   10/29/20 1848  BP: (!) 158/97  Pulse: 94  Resp: 17  Temp: 98 F (36.7 C)  TempSrc: Oral  SpO2: 100%    General appearance: alert; no distress HEENT: [place]; AT Neck: supple with FROM Resp: unlabored respirations Extremities: . RLE: warm with well perfused appearance; R ankle/foot with mild swelling; no bony TTP; FROM; normal distal sensation and cap refill Skin: warm and dry; no visible rashes Neurologic: gait normal; normal sensation and strength of RLE Psychological: alert and cooperative; normal mood and affect    No Known Allergies  Past Medical History:  Diagnosis Date  . Fibromyalgia   . Fibromyalgia   . Hypertension   . Osteoarthritis   . Rheumatoid arthritis(714.0)   .  Rheumatoid arthritis(714.0)    Social History   Socioeconomic History  . Marital status: Single    Spouse name: Not on file  . Number of children: Not on file  . Years of education: Not on file  . Highest education level: Not on file  Occupational History  . Occupation: disability  Tobacco Use  . Smoking status: Current Every Day Smoker    Packs/day: 0.50    Years: 20.00    Pack years: 10.00    Types: Cigarettes  . Smokeless tobacco: Never Used  Vaping Use  . Vaping Use: Never used  Substance and Sexual Activity  . Alcohol use: Not Currently  . Drug use: Not Currently    Types: Hydrocodone  . Sexual activity: Yes    Birth control/protection: Condom  Other Topics Concern  . Not on file  Social History Narrative  . Not on file   Social Determinants of Health   Financial Resource Strain: Low Risk   . Difficulty of Paying Living Expenses: Not hard at all  Food Insecurity: No Food Insecurity  . Worried About Charity fundraiser in the Last Year: Never true  . Ran Out of Food in the Last Year: Never true  Transportation Needs: No Transportation Needs  . Lack of Transportation (Medical): No  . Lack of Transportation (Non-Medical): No  Physical Activity: Inactive  . Days of Exercise per Week: 0 days  . Minutes of Exercise per Session: 0 min  Stress: Stress Concern  Present  . Feeling of Stress : To some extent  Social Connections: Not on file   Family History  Problem Relation Age of Onset  . Diabetes Mother   . Healthy Father   . Heart attack Maternal Grandmother   . Stroke Maternal Grandmother    History reviewed. No pertinent surgical history.    Vanessa Kick, MD 11/01/20 [phone]

## 2020-11-03 ENCOUNTER — Telehealth: Payer: Medicare Other

## 2020-11-04 ENCOUNTER — Other Ambulatory Visit: Payer: Self-pay

## 2020-11-04 ENCOUNTER — Encounter: Payer: Self-pay | Admitting: Rheumatology

## 2020-11-04 ENCOUNTER — Telehealth (INDEPENDENT_AMBULATORY_CARE_PROVIDER_SITE_OTHER): Payer: Medicare Other | Admitting: Rheumatology

## 2020-11-04 DIAGNOSIS — Z872 Personal history of diseases of the skin and subcutaneous tissue: Secondary | ICD-10-CM

## 2020-11-04 DIAGNOSIS — Z8679 Personal history of other diseases of the circulatory system: Secondary | ICD-10-CM

## 2020-11-04 DIAGNOSIS — Z8639 Personal history of other endocrine, nutritional and metabolic disease: Secondary | ICD-10-CM

## 2020-11-04 DIAGNOSIS — M25522 Pain in left elbow: Secondary | ICD-10-CM | POA: Diagnosis not present

## 2020-11-04 DIAGNOSIS — F17200 Nicotine dependence, unspecified, uncomplicated: Secondary | ICD-10-CM

## 2020-11-04 DIAGNOSIS — Z79899 Other long term (current) drug therapy: Secondary | ICD-10-CM | POA: Diagnosis not present

## 2020-11-04 DIAGNOSIS — R5383 Other fatigue: Secondary | ICD-10-CM

## 2020-11-04 DIAGNOSIS — M170 Bilateral primary osteoarthritis of knee: Secondary | ICD-10-CM

## 2020-11-04 DIAGNOSIS — M797 Fibromyalgia: Secondary | ICD-10-CM

## 2020-11-04 DIAGNOSIS — M0609 Rheumatoid arthritis without rheumatoid factor, multiple sites: Secondary | ICD-10-CM

## 2020-11-04 LAB — CBC WITH DIFFERENTIAL/PLATELET
Absolute Monocytes: 671 cells/uL (ref 200–950)
Basophils Absolute: 39 cells/uL (ref 0–200)
Basophils Relative: 0.7 %
Eosinophils Absolute: 28 cells/uL (ref 15–500)
Eosinophils Relative: 0.5 %
HCT: 42.9 % (ref 35.0–45.0)
Hemoglobin: 14.3 g/dL (ref 11.7–15.5)
Lymphs Abs: 1909 cells/uL (ref 850–3900)
MCH: 29.3 pg (ref 27.0–33.0)
MCHC: 33.3 g/dL (ref 32.0–36.0)
MCV: 87.9 fL (ref 80.0–100.0)
MPV: 9.6 fL (ref 7.5–12.5)
Monocytes Relative: 12.2 %
Neutro Abs: 2855 cells/uL (ref 1500–7800)
Neutrophils Relative %: 51.9 %
Platelets: 322 10*3/uL (ref 140–400)
RBC: 4.88 10*6/uL (ref 3.80–5.10)
RDW: 14.1 % (ref 11.0–15.0)
Total Lymphocyte: 34.7 %
WBC: 5.5 10*3/uL (ref 3.8–10.8)

## 2020-11-05 NOTE — Progress Notes (Signed)
CBC WNL

## 2020-11-26 ENCOUNTER — Telehealth: Payer: Self-pay

## 2020-11-26 NOTE — Telephone Encounter (Signed)
Jana Half from [employer] on behalf of medicaid called stated she received a referral for pt for personal care services but has been unable to reach pt she wanted to verify patient's phone number. [phone]  I returned her cal land advised her that I have gotten in touch with the pt and advised her to return her call and also verified number with Jana Half and it was correct. YL,RMA

## 2020-11-27 ENCOUNTER — Other Ambulatory Visit: Payer: Self-pay | Admitting: Physician Assistant

## 2020-11-27 ENCOUNTER — Other Ambulatory Visit: Payer: Self-pay | Admitting: Rheumatology

## 2020-11-27 NOTE — Telephone Encounter (Signed)
Last Visit: 11/04/2020 Next Visit: 02/12/2021 Labs: 10/14/2020,CMP Glucose 104, Hgb A1C 6.2, Cholesterol 24, Triglycerides 322, HDL34, VLDL Cholesterol 60, LDL Chol 150, Chol/HDL 7.2, 11/04/2020 CBC WNL TB Gold: 07/10/2020  Current Dose per office note 11/04/2020, Humira 40 mg sq once every 14 days  ZB:[id number] arthritis of multiple sites without rheumatoid factor   Last Fill: 07/28/2020  Okay to refill Humira?

## 2020-12-01 ENCOUNTER — Ambulatory Visit (HOSPITAL_COMMUNITY)
Admission: EM | Admit: 2020-12-01 | Discharge: 2020-12-01 | Disposition: A | Discharge status: Home / Self Care | Payer: Medicare HMO | Attending: Emergency Medicine | Admitting: Emergency Medicine

## 2020-12-01 ENCOUNTER — Encounter (HOSPITAL_COMMUNITY): Payer: Self-pay | Admitting: Emergency Medicine

## 2020-12-01 ENCOUNTER — Other Ambulatory Visit: Payer: Self-pay

## 2020-12-01 DIAGNOSIS — L732 Hidradenitis suppurativa: Secondary | ICD-10-CM

## 2020-12-01 DIAGNOSIS — M069 Rheumatoid arthritis, unspecified: Secondary | ICD-10-CM | POA: Diagnosis not present

## 2020-12-01 MED ORDER — CEPHALEXIN 500 MG PO CAPS: 1000.0000 mg | ORAL_CAPSULE | Freq: Two times a day (BID) | ORAL | 0 refills | Status: AC

## 2020-12-01 MED ORDER — HIBICLENS 4 % EX LIQD: Freq: Every day | CUTANEOUS | 0 refills | Status: DC | PRN

## 2020-12-01 MED ORDER — IBUPROFEN 600 MG PO TABS: 600.0000 mg | ORAL_TABLET | Freq: Four times a day (QID) | ORAL | 0 refills | Status: DC | PRN

## 2020-12-01 MED ORDER — PREDNISONE 10 MG (21) PO TBPK: ORAL_TABLET | ORAL | 0 refills | Status: DC

## 2020-12-01 MED ORDER — DOXYCYCLINE HYCLATE 100 MG PO CAPS: 100.0000 mg | ORAL_CAPSULE | Freq: Two times a day (BID) | ORAL | 0 refills | Status: AC

## 2020-12-01 NOTE — Discharge Instructions (Addendum)
I am giving you prednisone for the rheumatoid arthritis flare.  You can use the ibuprofen, but use it very sparingly as it can make your methotrexate toxic.  Take it no more than twice a day.  If you do not have to take it, then do not take it at all.  I am giving you Keflex and doxycycline for your armpits.  Keep clean with Hibiclens.  No deodorant.  Continue warm compresses.  Follow-up with dermatology  Go to the ER for fevers above 100.4 or for any of the signs and symptoms we discussed

## 2020-12-01 NOTE — ED Provider Notes (Signed)
HPI  SUBJECTIVE:  Shelby Robertson is a 50 y.o. female who presents with 2 issues: First she reports pain, swelling in her right knee, left shoulder and left elbow consistent with previous rheumatoid arthritis flares.  She denies trauma to these areas, change in physical activity, other joint swelling.  No joint erythema, fevers, body aches.  She has tried rest, BenGay, Ace wrap, Tylenol arthritis, hydrocodone, Cymbalta and Zanaflex.  These all help.  Symptoms are worse with movement.  She also reports a 4 days of bilateral painful axillary masses that are draining purulent material.  She has tried cleaning them with improvement in symptoms.  No aggravating factors.  She states this is identical to previous flares of here adenitis suppurativa which have responded well to Keflex and doxycycline in the past.  She has a past medical history of hypertension, rheumatoid arthritis/osteoarthritis on methotrexate/Humira, fibromyalgia, hypercholesterolemia, sepsis, diabetes.  No history of chronic kidney disease, peptic ulcer disease, GI bleed, MRSA.  She is a smoker.  ELF:YBOFB, Shelby Robertson, [place] She also has a rheumatologist.  She has an appointment with pain management on 2/17.   Past Medical History:  Diagnosis Date  . Fibromyalgia   . Fibromyalgia   . Hypertension   . Osteoarthritis   . Rheumatoid arthritis(714.0)   . Rheumatoid arthritis(714.0)     History reviewed. No pertinent surgical history.  Family History  Problem Relation Age of Onset  . Diabetes Mother   . Healthy Father   . Heart attack Maternal Grandmother   . Stroke Maternal Grandmother     Social History   Tobacco Use  . Smoking status: Current Every Day Smoker    Packs/day: 0.50    Years: 20.00    Pack years: 10.00    Types: Cigarettes  . Smokeless tobacco: Never Used  Vaping Use  . Vaping Use: Never used  Substance Use Topics  . Alcohol use: Not Currently  . Drug use: Not Currently    Types: Hydrocodone    No current  facility-administered medications for this encounter.  Current Outpatient Medications:  .  amLODipine (NORVASC) 10 MG tablet, Take 1 tablet (10 mg total) by mouth daily., Disp: 90 tablet, Rfl: 1 .  cephALEXin (KEFLEX) 500 MG capsule, Take 2 capsules (1,000 mg total) by mouth 2 (two) times daily for 5 days., Disp: 20 capsule, Rfl: 0 .  chlorhexidine (HIBICLENS) 4 % external liquid, Apply topically daily as needed. Dilute 10-15 mL in water, Use daily when bathing for 1-2 weeks, Disp: 120 mL, Rfl: 0 .  doxycycline (VIBRAMYCIN) 100 MG capsule, Take 1 capsule (100 mg total) by mouth 2 (two) times daily for 5 days., Disp: 10 capsule, Rfl: 0 .  DULoxetine (CYMBALTA) 60 MG capsule, Take 60 mg by mouth daily., Disp: , Rfl:  .  HUMIRA PEN 40 MG/0.8ML PNKT, INJECT 1 PEN INTO THE SKIN EVERY 14 DAYS, Disp: 2 each, Rfl: 2 .  HYDROcodone-acetaminophen (NORCO) 10-325 MG tablet, Take 1 tablet by mouth every 6 (six) hours., Disp: , Rfl:  .  ibuprofen (ADVIL) 600 MG tablet, Take 1 tablet (600 mg total) by mouth every 6 (six) hours as needed., Disp: 30 tablet, Rfl: 0 .  metFORMIN (GLUCOPHAGE) 500 MG tablet, Take 1 tablet (500 mg total) by mouth 2 (two) times daily with a meal., Disp: 180 tablet, Rfl: 1 .  methotrexate 50 MG/2ML injection, Inject 0.8 mls (20 mg total) into the skin weekly., Disp: 2 mL, Rfl:  .  metoprolol tartrate (LOPRESSOR) 50 MG tablet,  Take 1 tablet (50 mg total) by mouth 2 (two) times daily. Please schedule appointment for refills., Disp: 180 tablet, Rfl: 1 .  predniSONE (STERAPRED UNI-PAK 21 TAB) 10 MG (21) TBPK tablet, Dispense one 6 day pack. Take as directed with food., Disp: 21 tablet, Rfl: 0 .  tiZANidine (ZANAFLEX) 4 MG capsule, Take 4 mg by mouth every 6 (six) hours., Disp: , Rfl:  .  acetaminophen (TYLENOL) 650 MG CR tablet, Take 1,300 mg by mouth every 8 (eight) hours as needed for pain., Disp: , Rfl:  .  albuterol (PROVENTIL) (2.5 MG/3ML) 0.083% nebulizer solution, Take 3 mLs (2.5 mg  total) by nebulization every 6 (six) hours as needed for wheezing or shortness of breath., Disp: 75 mL, Rfl: 12 .  albuterol (VENTOLIN HFA) 108 (90 Base) MCG/ACT inhaler, Inhale 2 puffs into the lungs every 4 (four) hours as needed for wheezing or shortness of breath (cough, shortness of breath or wheezing.)., Disp: 6.7 g, Rfl: 4 .  aspirin 325 MG tablet, Take 325 mg by mouth every 6 (six) hours as needed (pain)., Disp: , Rfl:  .  atorvastatin (LIPITOR) 10 MG tablet, TAKE 1 TABLET BY MOUTH EVERYDAY AT BEDTIME, Disp: 90 tablet, Rfl: 0 .  B-D TB SYRINGE 1CC/27GX1/2" 27G X 1/2" 1 ML MISC, USE 1 SYRINGE TO INJECT METHOTREXATE INTO THE SKIN ONCE A WEEK., Disp: 12 each, Rfl: 3 .  blood glucose meter kit and supplies KIT, Dispense based on patient and insurance preference. Use up to two times daily as directed. (FOR ICD-9 250.00, 250.01)., Disp: 1 each, Rfl: 0 .  diphenhydrAMINE (BENADRYL) 25 MG tablet, Take 25 mg by mouth every 6 (six) hours as needed for itching, allergies or sleep., Disp: , Rfl:  .  ezetimibe (ZETIA) 10 MG tablet, Take 1 tablet (10 mg total) by mouth daily., Disp: 90 tablet, Rfl: 1 .  folic acid (FOLVITE) 1 MG tablet, Take 2 tablets (2 mg total) by mouth daily., Disp: 180 tablet, Rfl: 3 .  hydrochlorothiazide (HYDRODIURIL) 12.5 MG tablet, Take 1 tablet (12.5 mg total) by mouth daily as needed (fluid/edema)., Disp: 90 tablet, Rfl: 1 .  hydrOXYzine (ATARAX/VISTARIL) 25 MG tablet, Take 1 tablet (25 mg total) by mouth every 8 (eight) hours as needed for itching., Disp: 30 tablet, Rfl: 0 .  Lancets (ONETOUCH DELICA PLUS [id number]) MISC, 2 (two) times daily. use for testing, Disp: , Rfl:  .  mometasone (ELOCON) 0.1 % cream, Apply to affected area daily, Disp: 45 g, Rfl: 1 .  ONETOUCH VERIO test strip, TEST TWICE DAILY, Disp: 100 strip, Rfl: 3 .  tiZANidine (ZANAFLEX) 4 MG tablet, Take by mouth. (Patient not taking: Reported on 11/04/2020), Disp: , Rfl:  .  triamcinolone ointment (KENALOG) 0.1 %,  Apply topically., Disp: , Rfl:   No Known Allergies   ROS  As noted in HPI.   Physical Exam  BP (!) 151/95 (BP Location: Right Arm)   Pulse (!) 104   Temp 98.4 F (36.9 C) (Oral)   Resp (!) 21   SpO2 100%   Constitutional: Well developed, well nourished, no acute distress Eyes:  EOMI, conjunctiva normal bilaterally HENT: Normocephalic, atraumatic,mucus membranes moist Respiratory: Normal inspiratory effort Cardiovascular: Normal rate GI: nondistended skin:  Left axilla: Multiple tender masses.  White debris is deodorant.  No expressible purulent drainage.  Left axilla Appears identical to right axilla Right axilla.  Multiple nontender masses.  White debris is deodorant.  No expressible purulent drainage.     Musculoskeletal:  Left shoulder: Lshoulder with ROM normal, Drop test normal, Tenderness entire joint, no erythema, increased temperature.  Clavicle NT , A/C joint tender, scapula NT , proximal humerus NT,, Motor strength normal, Sensation intact LT over deltoid region, distal NVI with hand on affected side having intact sensation and strength in the distribution of the median, radial, and ulnar nerve.  Pain  with internal rotation, pain   with external rotation, negative tenderness in bicipital groove, pain aggravated with empty can test.  negative liftoff test, no instability with abduction/external rotation.  Left elbow: Elbow ROM Normal for Pt, body supracondylar region NT, Radial head  tender, Olecrenon process  tender, Medial epicondyle NT, Lateral epicondyle  Tender.  No erythema.  Mild swelling.  No increased temperature  Right knee: Knee ROM baseline for Pt, Flexion intact, positive joint swelling, tenderness above patella and around retinaculum.  No erythema.  No increased temperature.  Patella NT, Patellar tendon NT, Medial joint NT , Lateral joint NT , Popliteal region NT, Varus MCL stress testing stable, Valgus LCL stress testing stable, McMurray's testing normal  , Lachman's negative. Distal NVI with intact baseline sensation / motor / pulse distal to knee.[name] No crepitus.  Neurologic: Alert & oriented x 3, no focal neuro deficits Psychiatric: Speech and behavior appropriate   ED Course   Medications - No data to display  No orders of the defined types were placed in this encounter.   No results found for this or any previous visit (from the past 24 hour(s)). No results found.  ED Clinical Impression  1. Rheumatoid arthritis flare (HCC)   2. Hidradenitis suppurativa      ED Assessment/Plan  1.  Rheumatoid arthritis flare.  No evidence of septic arthritis or bursitis.  Will send home with 6-day prednisone taper and add ibuprofen 600 mg twice daily to her medication regimen.  Advised that ibuprofen may increase her methotrexate toxicity, and to not take the ibuprofen unless absolutely necessary.  Follow-up with her rheumatologist in 1 to 2 days to make sure that we have her on the right track.  Rewrapped left elbow and right knee with Ace wrap  2.  Hidradentitis suppurativa.  Will send home with Keflex, doxycycline, Hibiclens.  Continue warm compresses.  Follow-up with her dermatologist.  Discussed  MDM, treatment plan, and plan for follow-up with patient. Discussed sn/sx that should prompt return to the ED. patient agrees with plan.   Meds ordered this encounter  Medications  . ibuprofen (ADVIL) 600 MG tablet    Sig: Take 1 tablet (600 mg total) by mouth every 6 (six) hours as needed.    Dispense:  30 tablet    Refill:  0  . chlorhexidine (HIBICLENS) 4 % external liquid    Sig: Apply topically daily as needed. Dilute 10-15 mL in water, Use daily when bathing for 1-2 weeks    Dispense:  120 mL    Refill:  0  . doxycycline (VIBRAMYCIN) 100 MG capsule    Sig: Take 1 capsule (100 mg total) by mouth 2 (two) times daily for 5 days.    Dispense:  10 capsule    Refill:  0  . cephALEXin (KEFLEX) 500 MG capsule    Sig: Take 2 capsules (1,000  mg total) by mouth 2 (two) times daily for 5 days.    Dispense:  20 capsule    Refill:  0  . predniSONE (STERAPRED UNI-PAK 21 TAB) 10 MG (21) TBPK tablet    Sig: Dispense one 6  day pack. Take as directed with food.    Dispense:  21 tablet    Refill:  0    *This clinic note was created using [employer]. Therefore, there may be occasional mistakes despite careful proofreading.   ?    Melynda Ripple, MD 12/02/20 [phone]

## 2020-12-01 NOTE — ED Triage Notes (Signed)
Right knee, left elbow and left shoulder are painful.  These areas are chronic painful sites.  Reports changes in the weather aggravate these areas.  No knew injury

## 2020-12-03 MED FILL — HUMIRA PEN 40 MG/0.8ML PNKT: 40 | 28 days supply | Qty: 2 | Fill #0

## 2020-12-04 DIAGNOSIS — M25521 Pain in right elbow: Secondary | ICD-10-CM | POA: Diagnosis not present

## 2020-12-04 DIAGNOSIS — M25561 Pain in right knee: Secondary | ICD-10-CM | POA: Diagnosis not present

## 2020-12-04 DIAGNOSIS — F1120 Opioid dependence, uncomplicated: Secondary | ICD-10-CM | POA: Diagnosis not present

## 2020-12-04 DIAGNOSIS — Z79891 Long term (current) use of opiate analgesic: Secondary | ICD-10-CM | POA: Diagnosis not present

## 2020-12-04 DIAGNOSIS — M25571 Pain in right ankle and joints of right foot: Secondary | ICD-10-CM | POA: Diagnosis not present

## 2020-12-04 DIAGNOSIS — M79671 Pain in right foot: Secondary | ICD-10-CM | POA: Diagnosis not present

## 2020-12-04 DIAGNOSIS — G894 Chronic pain syndrome: Secondary | ICD-10-CM | POA: Diagnosis not present

## 2020-12-04 DIAGNOSIS — M542 Cervicalgia: Secondary | ICD-10-CM | POA: Diagnosis not present

## 2020-12-04 DIAGNOSIS — M25512 Pain in left shoulder: Secondary | ICD-10-CM | POA: Diagnosis not present

## 2020-12-04 DIAGNOSIS — M79605 Pain in left leg: Secondary | ICD-10-CM | POA: Diagnosis not present

## 2020-12-04 DIAGNOSIS — M79672 Pain in left foot: Secondary | ICD-10-CM | POA: Diagnosis not present

## 2020-12-04 DIAGNOSIS — G8929 Other chronic pain: Secondary | ICD-10-CM | POA: Diagnosis not present

## 2020-12-04 DIAGNOSIS — M79604 Pain in right leg: Secondary | ICD-10-CM | POA: Diagnosis not present

## 2020-12-08 ENCOUNTER — Emergency Department (HOSPITAL_COMMUNITY)
Admission: EM | Admit: 2020-12-08 | Discharge: 2020-12-08 | Disposition: A | Discharge status: Home / Self Care | Payer: Medicare HMO | Attending: Emergency Medicine | Admitting: Emergency Medicine

## 2020-12-08 ENCOUNTER — Encounter (HOSPITAL_COMMUNITY): Payer: Self-pay | Admitting: Emergency Medicine

## 2020-12-08 ENCOUNTER — Other Ambulatory Visit: Payer: Self-pay

## 2020-12-08 DIAGNOSIS — Z7982 Long term (current) use of aspirin: Secondary | ICD-10-CM | POA: Diagnosis not present

## 2020-12-08 DIAGNOSIS — Z7984 Long term (current) use of oral hypoglycemic drugs: Secondary | ICD-10-CM | POA: Insufficient documentation

## 2020-12-08 DIAGNOSIS — Z79899 Other long term (current) drug therapy: Secondary | ICD-10-CM | POA: Diagnosis not present

## 2020-12-08 DIAGNOSIS — E119 Type 2 diabetes mellitus without complications: Secondary | ICD-10-CM | POA: Diagnosis not present

## 2020-12-08 DIAGNOSIS — R030 Elevated blood-pressure reading, without diagnosis of hypertension: Secondary | ICD-10-CM

## 2020-12-08 DIAGNOSIS — R69 Illness, unspecified: Secondary | ICD-10-CM | POA: Diagnosis not present

## 2020-12-08 DIAGNOSIS — F17210 Nicotine dependence, cigarettes, uncomplicated: Secondary | ICD-10-CM | POA: Diagnosis not present

## 2020-12-08 DIAGNOSIS — I10 Essential (primary) hypertension: Secondary | ICD-10-CM | POA: Diagnosis not present

## 2020-12-08 NOTE — ED Notes (Signed)
Patient verbalizes understanding of discharge instructions. Opportunity for questioning and answers were provided. Armband removed by staff, pt discharged from ED.  

## 2020-12-08 NOTE — ED Notes (Signed)
The pt has had bp  Problems in the past higher bp than usual since yesterday.  The pt was fast asleep when I walked in no pain

## 2020-12-08 NOTE — ED Provider Notes (Signed)
[facility] EMERGENCY DEPARTMENT Provider Note   CSN: [id number] Arrival date & time: 12/08/20  2951     History Chief Complaint  Patient presents with  . Hypertension    Shelby Robertson is a 50 y.o. female with a history of HTN, HLD, fibromyalgia, diabetes mellitus, and rheumatoid arthritis who presents to the emergency department with a chief complaint of elevated blood pressure.  The patient reports that she has been checking her blood pressure at home and it has been elevated despite taking her home antihypertensive medications.  States that she takes 10 mg of amlodipine daily and has been on this medication for several months.  She reports compliance with no recent missed doses.  However, over the last 2 days, her blood pressure has been more elevated when she has checked it at home.  Reports that she has been receiving readings with systolic pressure in the 884Z.  She reports that she did have a headache earlier today, but it has since resolved.  She denies chest pain, shortness of breath, abdominal pain, difficulty urinating, dysuria, hematuria, polyuria, polydipsia, numbness, weakness, visual changes, palpitations, back pain, neck pain.  She is asymptomatic at this time.  No treatment prior to arrival.   The history is provided by the patient. No language interpreter was used.       Past Medical History:  Diagnosis Date  . Fibromyalgia   . Fibromyalgia   . Hypertension   . Osteoarthritis   . Rheumatoid arthritis(714.0)   . Rheumatoid arthritis(714.0)     Patient Active Problem List   Diagnosis Date Noted  . Controlled type 2 diabetes mellitus with complication, without long-term current use of insulin ([place]) 10/14/2020  . Sepsis ([place]) 09/17/2019  . AMS (altered mental status) 09/17/2019  . Morbid obesity ([place]) 01/10/2019  . Prediabetes 01/10/2019  . Tobacco use disorder, continuous 01/10/2019  . Soft tissue swelling of back 01/10/2019  .  Fibromyalgia 07/27/2017  . Other fatigue 07/27/2017  . History of hidradenitis suppurativa 07/27/2017  . History of hypertension 07/27/2017  . History of hyperlipidemia 07/27/2017  . Smoker 07/27/2017  . High risk medication use 08/21/2016  . Rheumatoid arthritis of multiple sites without rheumatoid factor ([place]) 08/12/2016  . ARTHRITIS, GENERALIZED 07/14/2010  . HYPERTENSION, BENIGN ESSENTIAL 03/04/2009  . POLYURIA 03/04/2009  . FIBROMYALGIA 11/09/2007  . HIDRADENITIS SUPPURATIVA 07/11/2007  . Mixed hyperlipidemia 06/28/2007  . LEG EDEMA, BILATERAL 05/05/2007    History reviewed. No pertinent surgical history.   OB History   No obstetric history on file.     Family History  Problem Relation Age of Onset  . Diabetes Mother   . Healthy Father   . Heart attack Maternal Grandmother   . Stroke Maternal Grandmother     Social History   Tobacco Use  . Smoking status: Current Every Day Smoker    Packs/day: 0.50    Years: 20.00    Pack years: 10.00    Types: Cigarettes  . Smokeless tobacco: Never Used  Vaping Use  . Vaping Use: Never used  Substance Use Topics  . Alcohol use: Not Currently  . Drug use: Not Currently    Types: Hydrocodone    Home Medications Prior to Admission medications   Medication Sig Start Date End Date Taking? Authorizing Provider  acetaminophen (TYLENOL) 650 MG CR tablet Take 1,300 mg by mouth every 8 (eight) hours as needed for pain.    [provider]  albuterol (PROVENTIL) (2.5 MG/3ML) 0.083% nebulizer solution Take  3 mLs (2.5 mg total) by nebulization every 6 (six) hours as needed for wheezing or shortness of breath. 10/11/19   Robyn Haber, MD  albuterol (VENTOLIN HFA) 108 (90 Base) MCG/ACT inhaler Inhale 2 puffs into the lungs every 4 (four) hours as needed for wheezing or shortness of breath (cough, shortness of breath or wheezing.). 10/11/19   Robyn Haber, MD  amLODipine (NORVASC) 10 MG tablet Take 1 tablet (10 mg total)  by mouth daily. 04/16/20   Minette Brine, FNP  aspirin 325 MG tablet Take 325 mg by mouth every 6 (six) hours as needed (pain).    [provider]  atorvastatin (LIPITOR) 10 MG tablet TAKE 1 TABLET BY MOUTH EVERYDAY AT BEDTIME 09/29/20   Minette Brine, FNP  B-D TB SYRINGE 1CC/27GX1/2" 27G X 1/2" 1 ML MISC USE 1 SYRINGE TO INJECT METHOTREXATE INTO THE SKIN ONCE A WEEK. 07/23/19   Bo Merino, MD  blood glucose meter kit and supplies KIT Dispense based on patient and insurance preference. Use up to two times daily as directed. (FOR ICD-9 250.00, 250.01). 01/17/20   Minette Brine, FNP  chlorhexidine (HIBICLENS) 4 % external liquid Apply topically daily as needed. Dilute 10-15 mL in water, Use daily when bathing for 1-2 weeks 12/01/20   Melynda Ripple, MD  diphenhydrAMINE (BENADRYL) 25 MG tablet Take 25 mg by mouth every 6 (six) hours as needed for itching, allergies or sleep.    [provider]  DULoxetine (CYMBALTA) 60 MG capsule Take 60 mg by mouth daily.    [provider]  ezetimibe (ZETIA) 10 MG tablet Take 1 tablet (10 mg total) by mouth daily. 10/14/20   Minette Brine, FNP  folic acid (FOLVITE) 1 MG tablet Take 2 tablets (2 mg total) by mouth daily. 02/07/20   Ofilia Neas, PA-C  HUMIRA PEN 40 MG/0.8ML PNKT INJECT 1 PEN INTO THE SKIN EVERY 14 DAYS 11/27/20   Ofilia Neas, PA-C  hydrochlorothiazide (HYDRODIURIL) 12.5 MG tablet Take 1 tablet (12.5 mg total) by mouth daily as needed (fluid/edema). 10/14/20   Minette Brine, FNP  HYDROcodone-acetaminophen (NORCO) 10-325 MG tablet Take 1 tablet by mouth every 6 (six) hours. 08/20/19   [provider]  hydrOXYzine (ATARAX/VISTARIL) 25 MG tablet Take 1 tablet (25 mg total) by mouth every 8 (eight) hours as needed for itching. 05/15/20   Lamptey, Myrene Galas, MD  ibuprofen (ADVIL) 600 MG tablet Take 1 tablet (600 mg total) by mouth every 6 (six) hours as needed. 12/01/20   Melynda Ripple, MD  Lancets [facility] DELICA  PLUS [id number]) MISC 2 (two) times daily. use for testing 01/17/20   [provider]  metFORMIN (GLUCOPHAGE) 500 MG tablet Take 1 tablet (500 mg total) by mouth 2 (two) times daily with a meal. 10/14/20   Minette Brine, FNP  methotrexate 50 MG/2ML injection Inject 0.8 mls (20 mg total) into the skin weekly. 02/07/20   Ofilia Neas, PA-C  metoprolol tartrate (LOPRESSOR) 50 MG tablet Take 1 tablet (50 mg total) by mouth 2 (two) times daily. Please schedule appointment for refills. 10/14/20   Minette Brine, FNP  mometasone (ELOCON) 0.1 % cream Apply to affected area daily 01/16/20 01/15/21  Minette Brine, FNP  [facility] VERIO test strip TEST TWICE DAILY 02/11/20   Minette Brine, FNP  predniSONE (STERAPRED UNI-PAK 21 TAB) 10 MG (21) TBPK tablet Dispense one 6 day pack. Take as directed with food. 12/01/20   Melynda Ripple, MD  tiZANidine (ZANAFLEX) 4 MG capsule Take 4 mg  by mouth every 6 (six) hours. 10/15/19   [provider]  tiZANidine (ZANAFLEX) 4 MG tablet Take by mouth. Patient not taking: Reported on 11/04/2020 10/08/20   [provider]  triamcinolone ointment (KENALOG) 0.1 % Apply topically. 09/30/20   [provider]    Allergies    Patient has no known allergies.  Review of Systems   Review of Systems  Constitutional: Negative for activity change, chills and fever.  HENT: Negative for congestion and sore throat.   Respiratory: Negative for shortness of breath and wheezing.   Cardiovascular: Negative for chest pain and palpitations.  Gastrointestinal: Negative for abdominal pain, diarrhea, nausea and vomiting.  Endocrine: Negative for polyphagia and polyuria.  Genitourinary: Negative for difficulty urinating, dysuria, hematuria and urgency.  Musculoskeletal: Negative for back pain, myalgias, neck pain and neck stiffness.  Skin: Negative for rash and wound.  Allergic/Immunologic: Negative for immunocompromised state.  Neurological: Positive for  headaches (resolved). Negative for dizziness, seizures, syncope, weakness and numbness.  Psychiatric/Behavioral: Negative for confusion.    Physical Exam Updated Vital Signs BP 130/82   Pulse 79   Temp 98 F (36.7 C) (Oral)   Resp 16   SpO2 97%   Physical Exam Vitals and nursing note reviewed.  Constitutional:      General: She is not in acute distress.    Appearance: She is not ill-appearing, toxic-appearing or diaphoretic.     Comments: Sleeping on arrival, but arouses easily to voice.  HENT:     Head: Normocephalic.  Eyes:     Conjunctiva/sclera: Conjunctivae normal.  Cardiovascular:     Rate and Rhythm: Normal rate and regular rhythm.     Pulses: Normal pulses.     Heart sounds: Normal heart sounds. No murmur heard. No friction rub. No gallop.   Pulmonary:     Effort: Pulmonary effort is normal. No respiratory distress.     Breath sounds: No stridor. No wheezing, rhonchi or rales.  Chest:     Chest wall: No tenderness.  Abdominal:     General: There is no distension.     Palpations: Abdomen is soft.  Musculoskeletal:     Cervical back: Neck supple.     Right lower leg: No edema.     Left lower leg: No edema.  Skin:    General: Skin is warm.     Findings: No rash.  Neurological:     General: No focal deficit present.     Mental Status: She is alert.  Psychiatric:        Behavior: Behavior normal.     ED Results / Procedures / Treatments   Labs (all labs ordered are listed, but only abnormal results are displayed) Labs Reviewed - No data to display  EKG None  Radiology No results found.  Procedures Procedures   Medications Ordered in ED Medications - No data to display  ED Course  I have reviewed the triage vital signs and the nursing notes.  Pertinent labs & imaging results that were available during my care of the patient were reviewed by me and considered in my medical decision making (see chart for details).    MDM  Rules/Calculators/A&P                          50 year old female with a history of HTN, HLD, fibromyalgia, diabetes mellitus, and rheumatoid arthritis who presents the emergency department with elevated blood pressure readings over the last 2 days  despite compliance with her home amlodipine.  She reports that she had a headache earlier today, but this is since resolved.  She has no complaints at this time.  Initially, BP 160/111 on arrival.  She was mildly tachycardic, but tachycardia has resolved without treatment and blood pressure is 116/70 on my evaluation.  Physical exam is reassuring.  At this time, no further emergent work-up is indicated.  I did consider the following in my clinical decision making including hypertensive urgency or emergency, hypertensive intracranial hemorrhage, aortic dissection, migraine, IIH.   However, that given that blood pressure has been downtrending without treatments and headache is resolved.  I think it is reasonable to have her follow-up with primary care for reevaluation.  ER return precautions given.  She is hemodynamically stable no acute distress.  Safer discharge to home with outpatient follow-up as indicated.  Final Clinical Impression(s) / ED Diagnoses Final diagnoses:  Elevated blood pressure reading    Rx / DC Orders ED Discharge Orders    None       Joanne Gavel, PA-C 40/33/53 [id number]    Delora Fuel, MD 09/92/78 2240

## 2020-12-08 NOTE — ED Triage Notes (Signed)
Pt reports her blood pressure is high despite taking her medication.  No other complaints. "I just wanted to make sure I was not going to have a stroke."

## 2020-12-08 NOTE — Discharge Instructions (Addendum)
Thank you for allowing me to care for you today in the Emergency Department.   Your blood pressure was 116/77 at discharge today. Try not to take it more than once every day or every other day.  Make sure they have been sitting for several minutes, you are relaxed when you check it at home.  If it continues to remain elevated, please follow-up with primary care.  If your blood pressure is significantly elevated and you develop a severe headache, uncontrollable abdominal pain, respiratory distress, if you become unable to urinate, have uncontrollable vomiting, dizziness, or other symptoms, you should return to the emergency department for reevaluation.

## 2020-12-09 ENCOUNTER — Telehealth: Payer: Medicare HMO

## 2020-12-09 ENCOUNTER — Ambulatory Visit (INDEPENDENT_AMBULATORY_CARE_PROVIDER_SITE_OTHER): Payer: Medicare HMO

## 2020-12-09 DIAGNOSIS — E782 Mixed hyperlipidemia: Secondary | ICD-10-CM

## 2020-12-09 DIAGNOSIS — I10 Essential (primary) hypertension: Secondary | ICD-10-CM | POA: Diagnosis not present

## 2020-12-09 DIAGNOSIS — M0609 Rheumatoid arthritis without rheumatoid factor, multiple sites: Secondary | ICD-10-CM

## 2020-12-09 DIAGNOSIS — R7303 Prediabetes: Secondary | ICD-10-CM

## 2020-12-12 NOTE — Chronic Care Management (AMB) (Signed)
Chronic Care Management   CCM RN Visit Note  12/09/2020 Name: Shelby Robertson MRN: [id number] DOB: [birth year]  Subjective: Shelby Robertson is a 50 y.o. year old female who is a primary care patient of Minette Brine, [place]. The care management team was consulted for assistance with disease management and care coordination needs.    Engaged with patient by telephone for follow up visit in response to provider referral for case management and/or care coordination services.   Consent to Services:  The patient was given information about Chronic Care Management services, agreed to services, and gave verbal consent prior to initiation of services.  Please see initial visit note for detailed documentation.   Patient agreed to services and verbal consent obtained.   Assessment: Review of patient past medical history, allergies, medications, health status, including review of consultants reports, laboratory and other test data, was performed as part of comprehensive evaluation and provision of chronic care management services.   SDOH (Social Determinants of Health) assessments and interventions performed:  Yes, no acute challenges identified  CCM Care Plan  No Known Allergies  Outpatient Encounter Medications as of 12/09/2020  Medication Sig Note  . acetaminophen (TYLENOL) 650 MG CR tablet Take 1,300 mg by mouth every 8 (eight) hours as needed for pain.   [name] albuterol (PROVENTIL) (2.5 MG/3ML) 0.083% nebulizer solution Take 3 mLs (2.5 mg total) by nebulization every 6 (six) hours as needed for wheezing or shortness of breath.   [name] albuterol (VENTOLIN HFA) 108 (90 Base) MCG/ACT inhaler Inhale 2 puffs into the lungs every 4 (four) hours as needed for wheezing or shortness of breath (cough, shortness of breath or wheezing.).   [name] amLODipine (NORVASC) 10 MG tablet Take 1 tablet (10 mg total) by mouth daily.   [name] aspirin 325 MG tablet Take 325 mg by mouth every 6 (six) hours as needed (pain).   [name]  atorvastatin (LIPITOR) 10 MG tablet TAKE 1 TABLET BY MOUTH EVERYDAY AT BEDTIME   . B-D TB SYRINGE 1CC/27GX1/2" 27G X 1/2" 1 ML MISC USE 1 SYRINGE TO INJECT METHOTREXATE INTO THE SKIN ONCE A WEEK.   . blood glucose meter kit and supplies KIT Dispense based on patient and insurance preference. Use up to two times daily as directed. (FOR ICD-9 250.00, 250.01).   . chlorhexidine (HIBICLENS) 4 % external liquid Apply topically daily as needed. Dilute 10-15 mL in water, Use daily when bathing for 1-2 weeks   . diphenhydrAMINE (BENADRYL) 25 MG tablet Take 25 mg by mouth every 6 (six) hours as needed for itching, allergies or sleep.   . DULoxetine (CYMBALTA) 60 MG capsule Take 60 mg by mouth daily. 09/17/2019: #30 filled [facility]/10/19 [facility] - bottle is empty  . ezetimibe (ZETIA) 10 MG tablet Take 1 tablet (10 mg total) by mouth daily.   . folic acid (FOLVITE) 1 MG tablet Take 2 tablets (2 mg total) by mouth daily.   [name] HUMIRA PEN 40 MG/0.8ML PNKT INJECT 1 PEN INTO THE SKIN EVERY 14 DAYS   . hydrochlorothiazide (HYDRODIURIL) 12.5 MG tablet Take 1 tablet (12.5 mg total) by mouth daily as needed (fluid/edema).   [name] HYDROcodone-acetaminophen (NORCO) 10-325 MG tablet Take 1 tablet by mouth every 6 (six) hours. 09/17/2019: #120 filled [facility]/20/19 [facility] per PMP AWARE - bottle is not in pt's medication bag  . hydrOXYzine (ATARAX/VISTARIL) 25 MG tablet Take 1 tablet (25 mg total) by mouth every 8 (eight) hours as needed for itching.   [name] ibuprofen (ADVIL) 600 MG tablet Take  1 tablet (600 mg total) by mouth every 6 (six) hours as needed.   . Lancets (ONETOUCH DELICA PLUS [id number]) MISC 2 (two) times daily. use for testing   . metFORMIN (GLUCOPHAGE) 500 MG tablet Take 1 tablet (500 mg total) by mouth 2 (two) times daily with a meal.   . methotrexate 50 MG/2ML injection Inject 0.8 mls (20 mg total) into the skin weekly.   . metoprolol tartrate (LOPRESSOR) 50 MG tablet Take 1 tablet (50 mg total) by mouth 2 (two) times daily. Please  schedule appointment for refills.   . mometasone (ELOCON) 0.1 % cream Apply to affected area daily   . ONETOUCH VERIO test strip TEST TWICE DAILY   . predniSONE (STERAPRED UNI-PAK 21 TAB) 10 MG (21) TBPK tablet Dispense one 6 day pack. Take as directed with food.   [name] tiZANidine (ZANAFLEX) 4 MG capsule Take 4 mg by mouth every 6 (six) hours.   [name] tiZANidine (ZANAFLEX) 4 MG tablet Take by mouth. (Patient not taking: Reported on 11/04/2020)   . triamcinolone ointment (KENALOG) 0.1 % Apply topically.    No facility-administered encounter medications on file as of 12/09/2020.    Patient Active Problem List   Diagnosis Date Noted  . Controlled type 2 diabetes mellitus with complication, without long-term current use of insulin ([place]) 10/14/2020  . Sepsis ([place]) 09/17/2019  . AMS (altered mental status) 09/17/2019  . Morbid obesity ([place]) 01/10/2019  . Prediabetes 01/10/2019  . Tobacco use disorder, continuous 01/10/2019  . Soft tissue swelling of back 01/10/2019  . Fibromyalgia 07/27/2017  . Other fatigue 07/27/2017  . History of hidradenitis suppurativa 07/27/2017  . History of hypertension 07/27/2017  . History of hyperlipidemia 07/27/2017  . Smoker 07/27/2017  . High risk medication use 08/21/2016  . Rheumatoid arthritis of multiple sites without rheumatoid factor ([place]) 08/12/2016  . ARTHRITIS, GENERALIZED 07/14/2010  . HYPERTENSION, BENIGN ESSENTIAL 03/04/2009  . POLYURIA 03/04/2009  . FIBROMYALGIA 11/09/2007  . HIDRADENITIS SUPPURATIVA 07/11/2007  . Mixed hyperlipidemia 06/28/2007  . LEG EDEMA, BILATERAL 05/05/2007    Conditions to be addressed/monitored:HTN, rheumatoid arthritis, mixed hyperlipidemia, prediabetes  Goals Addressed      Patient Stated   .  "to get good control over my BP" (pt-stated)   On track     [place] (see longitudinal plan of care for additional care plan information)  Current Barriers:  [name] Knowledge Deficits related to disease process and Self  Health management of HTN . Chronic Disease Management support and education needs related to HTN, rheumatoid arthritis, mixed hyperlipidemia, prediabetes Nurse Case Manager Clinical Goal(s):  [name] Over the next 180 days, patient will work with the CCM team and PCP to address needs related to disease education and support for improved Self Health management of HTN CCM RN CM Interventions:  12/09/20 call completed with patient  . Inter-disciplinary care team collaboration (see longitudinal plan of care) . Evaluation of current treatment plan related to HTN and patient's adherence to plan as established by provider. . Provided education to patient re: BP <130/80; Educated on dietary and exercise recommendations; Determined patient's BP has been elevated since being prescribed Prednisone  . Educated patient on potential SE that can occur from steroid usage including elevated BP  . Reviewed medications with patient and discussed patient is adhering to her prescribed medication regimen; Educated on the importance of taking her BP medications exactly as prescribed w/o missed does; Current treatment:  o amLODipine (NORVASC) 10 MG tablet; Take 1 tablet (10 mg  total) by mouth daily o metoprolol tartrate (LOPRESSOR) 50 MG tablet; Take 1 tablet (50 mg total) by mouth 2 (two) times daily . Advised patient, providing education and rationale, to monitor blood pressure daily and record, calling the CCM team and or PCP for findings outside established parameters . Confirmed patient received and reviewed printed educational materials related to "What is High Blood Pressure?"; "High Blood Pressure and African Americans"; "Why Should I Lower Sodium"; "High Blood Pressure and Stroke"; "Life's Simple 7" . Discussed and reviewed next PCP OV follow up appointment is scheduled for 01/22/21 _0 :30 AM  . Discussed plans with patient for ongoing care management follow up and provided patient with direct contact information for care  management team Patient Self Care Activities:  . Self administer medications as prescribed . Attend all scheduled provider appointments . Call pharmacy for medication refills . Call provider office for new concerns or questions  Next Follow Up Date: 04/17/21      Plan:Telephone follow up appointment with care management team member scheduled for:  04/17/21  Barb Merino, RN, BSN, CCM Care Management Coordinator [facility]  Direct Phone: [phone]

## 2020-12-12 NOTE — Patient Instructions (Signed)
Goals Addressed      Patient Stated   .  "to get good control over my BP" (pt-stated)   On track     [place] (see longitudinal plan of care for additional care plan information)  Current Barriers:  [name] Knowledge Deficits related to disease process and Self Health management of HTN . Chronic Disease Management support and education needs related to HTN, rheumatoid arthritis, mixed hyperlipidemia, prediabetes Nurse Case Manager Clinical Goal(s):  [name] Over the next 180 days, patient will work with the CCM team and PCP to address needs related to disease education and support for improved Self Health management of HTN CCM RN CM Interventions:  12/09/20 call completed with patient  . Inter-disciplinary care team collaboration (see longitudinal plan of care) . Evaluation of current treatment plan related to HTN and patient's adherence to plan as established by provider. . Provided education to patient re: BP <130/80; Educated on dietary and exercise recommendations; Determined patient's BP has been elevated since being prescribed Prednisone  . Educated patient on potential SE that can occur from steroid usage including elevated BP  . Reviewed medications with patient and discussed patient is adhering to her prescribed medication regimen; Educated on the importance of taking her BP medications exactly as prescribed w/o missed does; Current treatment:  o amLODipine (NORVASC) 10 MG tablet; Take 1 tablet (10 mg total) by mouth daily o metoprolol tartrate (LOPRESSOR) 50 MG tablet; Take 1 tablet (50 mg total) by mouth 2 (two) times daily . Advised patient, providing education and rationale, to monitor blood pressure daily and record, calling the CCM team and or PCP for findings outside established parameters . Confirmed patient received and reviewed printed educational materials related to "What is High Blood Pressure?"; "High Blood Pressure and African Americans"; "Why Should I Lower Sodium"; "High  Blood Pressure and Stroke"; "Life's Simple 7" . Discussed and reviewed next PCP OV follow up appointment is scheduled for 01/22/21 @10 :30 AM  . Discussed plans with patient for ongoing care management follow up and provided patient with direct contact information for care management team Patient Self Care Activities:  . Self administer medications as prescribed . Attend all scheduled provider appointments . Call pharmacy for medication refills . Call provider office for new concerns or questions  Please see past updates related to this goal by clicking on the "Past Updates" button in the selected goal

## 2020-12-15 ENCOUNTER — Telehealth: Payer: Self-pay

## 2020-12-15 DIAGNOSIS — M797 Fibromyalgia: Secondary | ICD-10-CM | POA: Diagnosis not present

## 2020-12-15 NOTE — Telephone Encounter (Signed)
Per JM : Schedule ER follow up elevated blood pressure  LVM for pt to call the office

## 2020-12-16 ENCOUNTER — Other Ambulatory Visit: Payer: Self-pay | Admitting: Nurse Practitioner

## 2020-12-16 DIAGNOSIS — I10 Essential (primary) hypertension: Secondary | ICD-10-CM

## 2020-12-16 DIAGNOSIS — M797 Fibromyalgia: Secondary | ICD-10-CM | POA: Diagnosis not present

## 2020-12-17 DIAGNOSIS — M797 Fibromyalgia: Secondary | ICD-10-CM | POA: Diagnosis not present

## 2020-12-18 DIAGNOSIS — M797 Fibromyalgia: Secondary | ICD-10-CM | POA: Diagnosis not present

## 2020-12-19 ENCOUNTER — Telehealth: Payer: Self-pay | Admitting: Pharmacist

## 2020-12-19 DIAGNOSIS — M797 Fibromyalgia: Secondary | ICD-10-CM | POA: Diagnosis not present

## 2020-12-19 NOTE — Telephone Encounter (Signed)
Received notification from [facility] that pharmacy is unable to reach patient to schedule of Humira. Reached out to patient and left VM providing pharmacy phone number as well rheum clinic phone number.  Knox Saliva, PharmD, MPH Clinical Pharmacist (Rheumatology and Pulmonology)

## 2020-12-20 DIAGNOSIS — M797 Fibromyalgia: Secondary | ICD-10-CM | POA: Diagnosis not present

## 2020-12-22 ENCOUNTER — Other Ambulatory Visit: Payer: Self-pay | Admitting: Nurse Practitioner

## 2020-12-22 DIAGNOSIS — M797 Fibromyalgia: Secondary | ICD-10-CM | POA: Diagnosis not present

## 2020-12-23 DIAGNOSIS — M797 Fibromyalgia: Secondary | ICD-10-CM | POA: Diagnosis not present

## 2020-12-24 DIAGNOSIS — M797 Fibromyalgia: Secondary | ICD-10-CM | POA: Diagnosis not present

## 2020-12-25 DIAGNOSIS — M797 Fibromyalgia: Secondary | ICD-10-CM | POA: Diagnosis not present

## 2020-12-26 DIAGNOSIS — M797 Fibromyalgia: Secondary | ICD-10-CM | POA: Diagnosis not present

## 2020-12-27 DIAGNOSIS — M797 Fibromyalgia: Secondary | ICD-10-CM | POA: Diagnosis not present

## 2020-12-30 DIAGNOSIS — M797 Fibromyalgia: Secondary | ICD-10-CM | POA: Diagnosis not present

## 2020-12-31 DIAGNOSIS — M797 Fibromyalgia: Secondary | ICD-10-CM | POA: Diagnosis not present

## 2021-01-01 DIAGNOSIS — M797 Fibromyalgia: Secondary | ICD-10-CM | POA: Diagnosis not present

## 2021-01-02 DIAGNOSIS — F1120 Opioid dependence, uncomplicated: Secondary | ICD-10-CM | POA: Diagnosis not present

## 2021-01-02 DIAGNOSIS — M79672 Pain in left foot: Secondary | ICD-10-CM | POA: Diagnosis not present

## 2021-01-02 DIAGNOSIS — Z6841 Body Mass Index (BMI) 40.0 and over, adult: Secondary | ICD-10-CM | POA: Diagnosis not present

## 2021-01-02 DIAGNOSIS — Z79891 Long term (current) use of opiate analgesic: Secondary | ICD-10-CM | POA: Diagnosis not present

## 2021-01-02 DIAGNOSIS — M545 Low back pain, unspecified: Secondary | ICD-10-CM | POA: Diagnosis not present

## 2021-01-02 DIAGNOSIS — M25512 Pain in left shoulder: Secondary | ICD-10-CM | POA: Diagnosis not present

## 2021-01-02 DIAGNOSIS — M542 Cervicalgia: Secondary | ICD-10-CM | POA: Diagnosis not present

## 2021-01-02 DIAGNOSIS — I10 Essential (primary) hypertension: Secondary | ICD-10-CM | POA: Diagnosis not present

## 2021-01-02 DIAGNOSIS — M25521 Pain in right elbow: Secondary | ICD-10-CM | POA: Diagnosis not present

## 2021-01-02 DIAGNOSIS — E1141 Type 2 diabetes mellitus with diabetic mononeuropathy: Secondary | ICD-10-CM | POA: Diagnosis not present

## 2021-01-02 DIAGNOSIS — M1712 Unilateral primary osteoarthritis, left knee: Secondary | ICD-10-CM | POA: Diagnosis not present

## 2021-01-02 DIAGNOSIS — G4733 Obstructive sleep apnea (adult) (pediatric): Secondary | ICD-10-CM | POA: Diagnosis not present

## 2021-01-02 DIAGNOSIS — G894 Chronic pain syndrome: Secondary | ICD-10-CM | POA: Diagnosis not present

## 2021-01-02 DIAGNOSIS — M797 Fibromyalgia: Secondary | ICD-10-CM | POA: Diagnosis not present

## 2021-01-02 DIAGNOSIS — G8929 Other chronic pain: Secondary | ICD-10-CM | POA: Diagnosis not present

## 2021-01-05 DIAGNOSIS — M797 Fibromyalgia: Secondary | ICD-10-CM | POA: Diagnosis not present

## 2021-01-08 DIAGNOSIS — M797 Fibromyalgia: Secondary | ICD-10-CM | POA: Diagnosis not present

## 2021-01-09 DIAGNOSIS — M797 Fibromyalgia: Secondary | ICD-10-CM | POA: Diagnosis not present

## 2021-01-12 DIAGNOSIS — M797 Fibromyalgia: Secondary | ICD-10-CM | POA: Diagnosis not present

## 2021-01-13 DIAGNOSIS — M545 Low back pain, unspecified: Secondary | ICD-10-CM | POA: Diagnosis not present

## 2021-01-13 DIAGNOSIS — M797 Fibromyalgia: Secondary | ICD-10-CM | POA: Diagnosis not present

## 2021-01-13 DIAGNOSIS — M25512 Pain in left shoulder: Secondary | ICD-10-CM | POA: Diagnosis not present

## 2021-01-13 DIAGNOSIS — E1141 Type 2 diabetes mellitus with diabetic mononeuropathy: Secondary | ICD-10-CM | POA: Diagnosis not present

## 2021-01-13 DIAGNOSIS — M79672 Pain in left foot: Secondary | ICD-10-CM | POA: Diagnosis not present

## 2021-01-13 DIAGNOSIS — G4733 Obstructive sleep apnea (adult) (pediatric): Secondary | ICD-10-CM | POA: Diagnosis not present

## 2021-01-13 DIAGNOSIS — G8929 Other chronic pain: Secondary | ICD-10-CM | POA: Diagnosis not present

## 2021-01-13 DIAGNOSIS — M1712 Unilateral primary osteoarthritis, left knee: Secondary | ICD-10-CM | POA: Diagnosis not present

## 2021-01-13 DIAGNOSIS — M542 Cervicalgia: Secondary | ICD-10-CM | POA: Diagnosis not present

## 2021-01-13 DIAGNOSIS — I10 Essential (primary) hypertension: Secondary | ICD-10-CM | POA: Diagnosis not present

## 2021-01-13 DIAGNOSIS — F1120 Opioid dependence, uncomplicated: Secondary | ICD-10-CM | POA: Diagnosis not present

## 2021-01-13 DIAGNOSIS — G894 Chronic pain syndrome: Secondary | ICD-10-CM | POA: Diagnosis not present

## 2021-01-13 DIAGNOSIS — Z79891 Long term (current) use of opiate analgesic: Secondary | ICD-10-CM | POA: Diagnosis not present

## 2021-01-13 DIAGNOSIS — Z6841 Body Mass Index (BMI) 40.0 and over, adult: Secondary | ICD-10-CM | POA: Diagnosis not present

## 2021-01-13 DIAGNOSIS — M25521 Pain in right elbow: Secondary | ICD-10-CM | POA: Diagnosis not present

## 2021-01-14 DIAGNOSIS — M797 Fibromyalgia: Secondary | ICD-10-CM | POA: Diagnosis not present

## 2021-01-15 ENCOUNTER — Other Ambulatory Visit (HOSPITAL_COMMUNITY): Payer: Self-pay

## 2021-01-15 DIAGNOSIS — M797 Fibromyalgia: Secondary | ICD-10-CM | POA: Diagnosis not present

## 2021-01-17 ENCOUNTER — Other Ambulatory Visit (HOSPITAL_COMMUNITY): Payer: Self-pay

## 2021-01-17 DIAGNOSIS — M797 Fibromyalgia: Secondary | ICD-10-CM | POA: Diagnosis not present

## 2021-01-17 MED FILL — Adalimumab Auto-injector Kit 40 MG/0.8ML: SUBCUTANEOUS | 28 days supply | Qty: 2 | Fill #0 | Status: AC

## 2021-01-19 DIAGNOSIS — M797 Fibromyalgia: Secondary | ICD-10-CM | POA: Diagnosis not present

## 2021-01-20 ENCOUNTER — Other Ambulatory Visit (HOSPITAL_COMMUNITY): Payer: Self-pay

## 2021-01-20 DIAGNOSIS — M797 Fibromyalgia: Secondary | ICD-10-CM | POA: Diagnosis not present

## 2021-01-21 DIAGNOSIS — M797 Fibromyalgia: Secondary | ICD-10-CM | POA: Diagnosis not present

## 2021-01-22 ENCOUNTER — Ambulatory Visit: Payer: Medicare Other | Admitting: Nurse Practitioner

## 2021-01-22 ENCOUNTER — Ambulatory Visit: Payer: Medicare Other

## 2021-01-22 DIAGNOSIS — M797 Fibromyalgia: Secondary | ICD-10-CM | POA: Diagnosis not present

## 2021-01-23 DIAGNOSIS — M797 Fibromyalgia: Secondary | ICD-10-CM | POA: Diagnosis not present

## 2021-01-24 DIAGNOSIS — M797 Fibromyalgia: Secondary | ICD-10-CM | POA: Diagnosis not present

## 2021-01-26 DIAGNOSIS — M797 Fibromyalgia: Secondary | ICD-10-CM | POA: Diagnosis not present

## 2021-01-27 DIAGNOSIS — M797 Fibromyalgia: Secondary | ICD-10-CM | POA: Diagnosis not present

## 2021-01-28 DIAGNOSIS — M797 Fibromyalgia: Secondary | ICD-10-CM | POA: Diagnosis not present

## 2021-01-29 DIAGNOSIS — M797 Fibromyalgia: Secondary | ICD-10-CM | POA: Diagnosis not present

## 2021-02-02 ENCOUNTER — Other Ambulatory Visit: Payer: Self-pay | Admitting: Nurse Practitioner

## 2021-02-03 ENCOUNTER — Other Ambulatory Visit (HOSPITAL_COMMUNITY): Payer: Self-pay

## 2021-02-09 ENCOUNTER — Other Ambulatory Visit (HOSPITAL_COMMUNITY): Payer: Self-pay

## 2021-02-10 ENCOUNTER — Other Ambulatory Visit (HOSPITAL_COMMUNITY): Payer: Self-pay

## 2021-02-10 DIAGNOSIS — G894 Chronic pain syndrome: Secondary | ICD-10-CM | POA: Diagnosis not present

## 2021-02-10 DIAGNOSIS — G8929 Other chronic pain: Secondary | ICD-10-CM | POA: Diagnosis not present

## 2021-02-10 DIAGNOSIS — I10 Essential (primary) hypertension: Secondary | ICD-10-CM | POA: Diagnosis not present

## 2021-02-10 DIAGNOSIS — M25521 Pain in right elbow: Secondary | ICD-10-CM | POA: Diagnosis not present

## 2021-02-10 DIAGNOSIS — M542 Cervicalgia: Secondary | ICD-10-CM | POA: Diagnosis not present

## 2021-02-10 DIAGNOSIS — M79671 Pain in right foot: Secondary | ICD-10-CM | POA: Diagnosis not present

## 2021-02-10 DIAGNOSIS — E1141 Type 2 diabetes mellitus with diabetic mononeuropathy: Secondary | ICD-10-CM | POA: Diagnosis not present

## 2021-02-10 DIAGNOSIS — G4733 Obstructive sleep apnea (adult) (pediatric): Secondary | ICD-10-CM | POA: Diagnosis not present

## 2021-02-10 DIAGNOSIS — M1712 Unilateral primary osteoarthritis, left knee: Secondary | ICD-10-CM | POA: Diagnosis not present

## 2021-02-10 DIAGNOSIS — M25512 Pain in left shoulder: Secondary | ICD-10-CM | POA: Diagnosis not present

## 2021-02-10 DIAGNOSIS — M545 Low back pain, unspecified: Secondary | ICD-10-CM | POA: Diagnosis not present

## 2021-02-10 DIAGNOSIS — F1120 Opioid dependence, uncomplicated: Secondary | ICD-10-CM | POA: Diagnosis not present

## 2021-02-10 DIAGNOSIS — M79672 Pain in left foot: Secondary | ICD-10-CM | POA: Diagnosis not present

## 2021-02-10 DIAGNOSIS — Z79891 Long term (current) use of opiate analgesic: Secondary | ICD-10-CM | POA: Diagnosis not present

## 2021-02-10 MED FILL — Adalimumab Auto-injector Kit 40 MG/0.8ML: SUBCUTANEOUS | 28 days supply | Qty: 2 | Fill #1 | Status: CN

## 2021-02-12 ENCOUNTER — Ambulatory Visit (INDEPENDENT_AMBULATORY_CARE_PROVIDER_SITE_OTHER): Payer: Medicare HMO | Admitting: Rheumatology

## 2021-02-12 ENCOUNTER — Ambulatory Visit (INDEPENDENT_AMBULATORY_CARE_PROVIDER_SITE_OTHER): Payer: Medicare HMO | Admitting: Nurse Practitioner

## 2021-02-12 ENCOUNTER — Ambulatory Visit: Payer: Medicare Other | Admitting: Nurse Practitioner

## 2021-02-12 ENCOUNTER — Encounter: Payer: Self-pay | Admitting: Nurse Practitioner

## 2021-02-12 ENCOUNTER — Encounter: Payer: Self-pay | Admitting: Rheumatology

## 2021-02-12 ENCOUNTER — Other Ambulatory Visit: Payer: Self-pay

## 2021-02-12 ENCOUNTER — Ambulatory Visit: Payer: Medicare HMO

## 2021-02-12 ENCOUNTER — Other Ambulatory Visit (HOSPITAL_COMMUNITY): Payer: Self-pay

## 2021-02-12 VITALS — BP 132/84 | HR 88 | Temp 98.0°F | Ht 67.4 in | Wt 289.8 lb

## 2021-02-12 VITALS — BP 174/108 | HR 88 | Resp 16 | Ht 66.0 in | Wt 290.6 lb

## 2021-02-12 DIAGNOSIS — E118 Type 2 diabetes mellitus with unspecified complications: Secondary | ICD-10-CM

## 2021-02-12 DIAGNOSIS — Z79899 Other long term (current) drug therapy: Secondary | ICD-10-CM

## 2021-02-12 DIAGNOSIS — I10 Essential (primary) hypertension: Secondary | ICD-10-CM

## 2021-02-12 DIAGNOSIS — M797 Fibromyalgia: Secondary | ICD-10-CM | POA: Diagnosis not present

## 2021-02-12 DIAGNOSIS — F17200 Nicotine dependence, unspecified, uncomplicated: Secondary | ICD-10-CM

## 2021-02-12 DIAGNOSIS — R5383 Other fatigue: Secondary | ICD-10-CM

## 2021-02-12 DIAGNOSIS — Z872 Personal history of diseases of the skin and subcutaneous tissue: Secondary | ICD-10-CM | POA: Diagnosis not present

## 2021-02-12 DIAGNOSIS — Z8639 Personal history of other endocrine, nutritional and metabolic disease: Secondary | ICD-10-CM | POA: Diagnosis not present

## 2021-02-12 DIAGNOSIS — M170 Bilateral primary osteoarthritis of knee: Secondary | ICD-10-CM | POA: Diagnosis not present

## 2021-02-12 DIAGNOSIS — M0609 Rheumatoid arthritis without rheumatoid factor, multiple sites: Secondary | ICD-10-CM

## 2021-02-12 DIAGNOSIS — Z8679 Personal history of other diseases of the circulatory system: Secondary | ICD-10-CM

## 2021-02-12 DIAGNOSIS — R21 Rash and other nonspecific skin eruption: Secondary | ICD-10-CM

## 2021-02-12 DIAGNOSIS — Z1211 Encounter for screening for malignant neoplasm of colon: Secondary | ICD-10-CM | POA: Diagnosis not present

## 2021-02-12 DIAGNOSIS — M62838 Other muscle spasm: Secondary | ICD-10-CM | POA: Diagnosis not present

## 2021-02-12 DIAGNOSIS — R69 Illness, unspecified: Secondary | ICD-10-CM | POA: Diagnosis not present

## 2021-02-12 NOTE — Patient Instructions (Addendum)
Standing Labs We placed an order today for your standing lab work.   Please have your standing labs drawn in July and every 3 months  If possible, please have your labs drawn 2 weeks prior to your appointment so that the provider can discuss your results at your appointment.  We have open lab daily Monday through Thursday from 1:30-4:30 PM and Friday from 1:30-4:00 PM at the office of Dr. Bo Merino, [facility].   Please be advised, all patients with office appointments requiring lab work will take precedents over walk-in lab work.  If possible, please come for your lab work on Monday and Friday afternoons, as you may experience shorter wait times. The office is located at [address], [place], [place], [place] [address] No appointment is necessary.   Labs are drawn by [facility]. Please bring your co-pay at the time of your lab draw.  You may receive a bill from [facility] for your lab work.  If you wish to have your labs drawn at another location, please call the office 24 hours in advance to send orders.  If you have any questions regarding directions or hours of operation,  please call [phone].   As a reminder, please drink plenty of water prior to coming for your lab work. Thanks!  Please discontinue Humira and methotrexate if you develop an infection.  You may resume the medications once the infection resolves.  Please schedule yearly skin examination from the dermatologist to monitor for nonmelanoma skin cancer.  COVID-19 vaccine recommendations:   COVID-19 vaccine is recommended for everyone (unless you are allergic to a vaccine component), even if you are on a medication that suppresses your immune system.   If you are on Methotrexate, Cellcept (mycophenolate), Rinvoq, Morrie Sheldon, and Olumiant- hold the medication for 1 week after each vaccine. Hold Methotrexate for 2 weeks after the single dose COVID-19 vaccine.   The recommendations are that the patients on  immunosuppressive agents should get the first 3 COVID-19 vaccines 1 month apart and then a fourth dose (booster) 3 months after the third dose.   Do not take Tylenol or any anti-inflammatory medications (NSAIDs) 24 hours prior to the COVID-19 vaccination.   There is no direct evidence about the efficacy of the COVID-19 vaccine in individuals who are on medications that suppress the immune system.   Even if you are fully vaccinated, and you are on any medications that suppress your immune system, please continue to wear a mask, maintain at least six feet social distance and practice hand hygiene.   If you develop a COVID-19 infection, please contact your PCP or our office to determine if you need monoclonal antibody infusion.  The booster vaccine is now available for immunocompromised patients.   Please see the following web sites for updated information.   [email or web address]  Vaccines You are taking a medication(s) that can suppress your immune system.  The following immunizations are recommended: . Flu annually . Covid-19  . Pneumonia (Pneumovax 23 and Prevnar 13 spaced at least 1 year apart) . Shingrix (after age 36)  Please check with your PCP to make sure you are up to date.   Heart Disease Prevention   Your inflammatory disease increases your risk of heart disease which includes heart attack, stroke, atrial fibrillation (irregular heartbeats), high blood pressure, heart failure and atherosclerosis (plaque in the arteries).  It is important to reduce your risk by:   . Keep blood pressure, cholesterol, and blood sugar at healthy levels   .  Smoking Cessation   . Maintain a healthy weight  o BMI 20-25   . Eat a healthy diet  o Plenty of fresh fruit, vegetables, and whole grains  o Limit saturated fats, foods high in sodium, and added sugars  o DASH and Mediterranean diet   . Increase physical activity   o Recommend moderate physically activity for 150 minutes per week/ 30 minutes a day for five days a week These can be broken up into three separate ten-minute sessions during the day.   . Reduce Stress  . Meditation, slow breathing exercises, yoga, coloring books  . Dental visits twice a year

## 2021-02-12 NOTE — Progress Notes (Deleted)
I,Evelean Bigler [employer] as a Education administrator for [employer], FNP.,have documented all relevant documentation on the behalf of Minette Brine, FNP,as directed by  Minette Brine, FNP while in the presence of Minette Brine, [place]. This visit occurred during the SARS-CoV-2 public health emergency.  Safety protocols were in place, including screening questions prior to the visit, additional usage of staff PPE, and extensive cleaning of exam room while observing appropriate contact time as indicated for disinfecting solutions.  Subjective:     Patient ID: Shelby Robertson , female    DOB: [birth year] , 50 y.o.   MRN: [id number]   Chief Complaint  Patient presents with  . Hypertension  . Diabetes    HPI  The patient is here today for a follow-up on her blood pressure, prediabetes.  Wt Readings from Last 3 Encounters: 02/12/21 : 289 lb 12.8 oz (131.5 kg) 10/14/20 : 294 lb (133.4 kg) 07/15/20 : 298 lb 3.2 oz (135.3 kg)  Hypertension This is a chronic problem. The current episode started more than 1 year ago. The problem is unchanged. The problem is uncontrolled. Pertinent negatives include no anxiety, chest pain, headaches, palpitations or shortness of breath. There are no associated agents to hypertension. Risk factors for coronary artery disease include obesity and sedentary lifestyle. There is no history of angina. There is no history of chronic renal disease.     Past Medical History:  Diagnosis Date  . Fibromyalgia   . Fibromyalgia   . Hypertension   . Osteoarthritis   . Rheumatoid arthritis(714.0)   . Rheumatoid arthritis(714.0)      Family History  Problem Relation Age of Onset  . Diabetes Mother   . Healthy Father   . Heart attack Maternal Grandmother   . Stroke Maternal Grandmother      Current Outpatient Medications:  .  acetaminophen (TYLENOL) 650 MG CR tablet, Take 1,300 mg by mouth every 8 (eight) hours as needed for pain., Disp: , Rfl:  .  Adalimumab 40 MG/0.8ML PNKT,  INJECT 1 PEN INTO THE SKIN EVERY 14 DAYS, Disp: 2 each, Rfl: 2 .  albuterol (PROVENTIL) (2.5 MG/3ML) 0.083% nebulizer solution, Take 3 mLs (2.5 mg total) by nebulization every 6 (six) hours as needed for wheezing or shortness of breath., Disp: 75 mL, Rfl: 12 .  albuterol (VENTOLIN HFA) 108 (90 Base) MCG/ACT inhaler, Inhale 2 puffs into the lungs every 4 (four) hours as needed for wheezing or shortness of breath (cough, shortness of breath or wheezing.)., Disp: 6.7 g, Rfl: 4 .  amLODipine (NORVASC) 10 MG tablet, TAKE 1 TABLET BY MOUTH EVERY DAY, Disp: 90 tablet, Rfl: 1 .  aspirin 325 MG tablet, Take 325 mg by mouth every 6 (six) hours as needed (pain)., Disp: , Rfl:  .  atorvastatin (LIPITOR) 10 MG tablet, TAKE 1 TABLET BY MOUTH EVERYDAY AT BEDTIME, Disp: 90 tablet, Rfl: 0 .  B-D TB SYRINGE 1CC/27GX1/2" 27G X 1/2" 1 ML MISC, USE 1 SYRINGE TO INJECT METHOTREXATE INTO THE SKIN ONCE A WEEK., Disp: 12 each, Rfl: 3 .  blood glucose meter kit and supplies KIT, Dispense based on patient and insurance preference. Use up to two times daily as directed. (FOR ICD-9 250.00, 250.01)., Disp: 1 each, Rfl: 0 .  chlorhexidine (HIBICLENS) 4 % external liquid, Apply topically daily as needed. Dilute 10-15 mL in water, Use daily when bathing for 1-2 weeks, Disp: 120 mL, Rfl: 0 .  diphenhydrAMINE (BENADRYL) 25 MG tablet, Take 25 mg by mouth every 6 (six) hours as  needed for itching, allergies or sleep., Disp: , Rfl:  .  DULoxetine (CYMBALTA) 60 MG capsule, Take 60 mg by mouth daily., Disp: , Rfl:  .  ezetimibe (ZETIA) 10 MG tablet, Take 1 tablet (10 mg total) by mouth daily., Disp: 90 tablet, Rfl: 1 .  folic acid (FOLVITE) 1 MG tablet, Take 2 tablets (2 mg total) by mouth daily., Disp: 180 tablet, Rfl: 3 .  hydrochlorothiazide (HYDRODIURIL) 12.5 MG tablet, Take 1 tablet (12.5 mg total) by mouth daily as needed (fluid/edema)., Disp: 90 tablet, Rfl: 1 .  HYDROcodone-acetaminophen (NORCO) 10-325 MG tablet, Take 1 tablet by  mouth every 6 (six) hours., Disp: , Rfl:  .  hydrOXYzine (ATARAX/VISTARIL) 25 MG tablet, Take 1 tablet (25 mg total) by mouth every 8 (eight) hours as needed for itching., Disp: 30 tablet, Rfl: 0 .  ibuprofen (ADVIL) 600 MG tablet, Take 1 tablet (600 mg total) by mouth every 6 (six) hours as needed., Disp: 30 tablet, Rfl: 0 .  Lancets (ONETOUCH DELICA PLUS [id number]) MISC, 2 (two) times daily. use for testing, Disp: , Rfl:  .  metFORMIN (GLUCOPHAGE) 500 MG tablet, Take 1 tablet (500 mg total) by mouth 2 (two) times daily with a meal., Disp: 180 tablet, Rfl: 1 .  methotrexate 50 MG/2ML injection, Inject 0.8 mls (20 mg total) into the skin weekly., Disp: 2 mL, Rfl:  .  metoprolol tartrate (LOPRESSOR) 50 MG tablet, Take 1 tablet (50 mg total) by mouth 2 (two) times daily. Please schedule appointment for refills., Disp: 180 tablet, Rfl: 1 .  ONETOUCH VERIO test strip, TEST TWICE DAILY, Disp: 100 strip, Rfl: 3 .  predniSONE (STERAPRED UNI-PAK 21 TAB) 10 MG (21) TBPK tablet, Dispense one 6 day pack. Take as directed with food., Disp: 21 tablet, Rfl: 0 .  tiZANidine (ZANAFLEX) 4 MG capsule, Take 4 mg by mouth every 6 (six) hours., Disp: , Rfl:  .  tiZANidine (ZANAFLEX) 4 MG tablet, Take by mouth. (Patient not taking: Reported on 11/04/2020), Disp: , Rfl:  .  triamcinolone ointment (KENALOG) 0.1 %, Apply topically., Disp: , Rfl:    No Known Allergies   Review of Systems  Respiratory: Negative for shortness of breath.   Cardiovascular: Negative for chest pain and palpitations.  Neurological: Negative for headaches.     There were no vitals filed for this visit. There is no height or weight on file to calculate BMI.   Objective:  Physical Exam      Assessment And Plan:     1. Essential hypertension  2. Controlled type 2 diabetes mellitus with complication, without long-term current use of insulin ([place])     Patient was given opportunity to ask questions. Patient verbalized understanding of  the plan and was able to repeat key elements of the plan. All questions were answered to their satisfaction.  [address] [place], CMA   I, [place], [place], have reviewed all documentation for this visit. The documentation on 02/12/21 for the exam, diagnosis, procedures, and orders are all accurate and complete.   IF YOU HAVE BEEN REFERRED TO A SPECIALIST, IT MAY TAKE 1-2 WEEKS TO SCHEDULE/PROCESS THE REFERRAL. IF YOU HAVE NOT HEARD FROM US/SPECIALIST IN TWO WEEKS, PLEASE GIVE [place] A CALL AT [phone] X [phone].   THE PATIENT IS ENCOURAGED TO PRACTICE SOCIAL DISTANCING DUE TO THE COVID-19 PANDEMIC.

## 2021-02-12 NOTE — Progress Notes (Signed)
Office Visit Note  Patient: Shelby Robertson             Date of Birth: 1971-07-03           MRN: [id number]             PCP: Minette Brine, FNP Referring: Minette Brine, FNP Visit Date: 02/12/2021 Occupation: @GUAROCC @  Subjective:  Medication monitoring.   History of Present Illness: Shelby Robertson is a 50 y.o. female with history of rheumatoid arthritis, hidradenitis suppurativa and osteoarthritis.  She states she is doing very well on the current combination.  She has been taking methotrexate 0.8 mL subcutaneously weekly along with folic acid and Humira every other week.  She denies any joint pain or joint swelling.  She has been tolerating medications well.  Activities of Daily Living:  Patient reports morning stiffness for 0 minute.   Patient Denies nocturnal pain.  Difficulty dressing/grooming: Denies Difficulty climbing stairs: Denies Difficulty getting out of chair: Denies Difficulty using hands for taps, buttons, cutlery, and/or writing: Denies  Review of Systems  Constitutional: Negative for fatigue, night sweats, weight gain and weight loss.  HENT: Negative for mouth sores, trouble swallowing, trouble swallowing, mouth dryness and nose dryness.   Eyes: Negative for pain, redness, visual disturbance and dryness.  Respiratory: Negative for cough, shortness of breath and difficulty breathing.   Cardiovascular: Negative for chest pain, palpitations, hypertension, irregular heartbeat and swelling in legs/feet.  Gastrointestinal: Negative for blood in stool, constipation and diarrhea.  Endocrine: Negative for increased urination.  Genitourinary: Negative for vaginal dryness.  Musculoskeletal: Negative for arthralgias, joint pain, joint swelling, myalgias, muscle weakness, morning stiffness, muscle tenderness and myalgias.  Skin: Negative for color change, rash, hair loss, skin tightness, ulcers and sensitivity to sunlight.  Allergic/Immunologic: Negative for susceptible to  infections.  Neurological: Negative for dizziness, memory loss, night sweats and weakness.  Hematological: Negative for swollen glands.  Psychiatric/Behavioral: Negative for depressed mood and sleep disturbance. The patient is not nervous/anxious.     PMFS History:  Patient Active Problem List   Diagnosis Date Noted  . Controlled type 2 diabetes mellitus with complication, without long-term current use of insulin ([place]) 10/14/2020  . Sepsis ([place]) 09/17/2019  . AMS (altered mental status) 09/17/2019  . Morbid obesity ([place]) 01/10/2019  . Prediabetes 01/10/2019  . Tobacco use disorder, continuous 01/10/2019  . Soft tissue swelling of back 01/10/2019  . Fibromyalgia 07/27/2017  . Other fatigue 07/27/2017  . History of hidradenitis suppurativa 07/27/2017  . History of hypertension 07/27/2017  . History of hyperlipidemia 07/27/2017  . Smoker 07/27/2017  . High risk medication use 08/21/2016  . Rheumatoid arthritis of multiple sites without rheumatoid factor ([place]) 08/12/2016  . ARTHRITIS, GENERALIZED 07/14/2010  . HYPERTENSION, BENIGN ESSENTIAL 03/04/2009  . POLYURIA 03/04/2009  . FIBROMYALGIA 11/09/2007  . HIDRADENITIS SUPPURATIVA 07/11/2007  . Mixed hyperlipidemia 06/28/2007  . LEG EDEMA, BILATERAL 05/05/2007    Past Medical History:  Diagnosis Date  . Fibromyalgia   . Fibromyalgia   . Hypertension   . Osteoarthritis   . Rheumatoid arthritis(714.0)   . Rheumatoid arthritis(714.0)     Family History  Problem Relation Age of Onset  . Diabetes Mother   . Healthy Father   . Heart attack Maternal Grandmother   . Stroke Maternal Grandmother    History reviewed. No pertinent surgical history. Social History   Social History Narrative  . Not on file   Immunization History  Administered Date(s) Administered  . Influenza,inj,Quad PF,6+ Mos 09/18/2019,  10/14/2020  . Influenza-Unspecified 07/18/2014  . PFIZER([place])SARS-COV-2 Vaccination 02/11/2020, 03/03/2020  . Td  11/11/2008     Objective: Vital Signs: BP (!) 174/108 (BP Location: Left Arm, Patient Position: Sitting, Cuff Size: Normal)   Pulse 88   Resp 16   Ht 5\' 6"  (1.676 m)   Wt 290 lb 9.6 oz (131.8 kg)   BMI 46.90 kg/m    Physical Exam Vitals and nursing note reviewed.  Constitutional:      Appearance: She is well-developed.  HENT:     Head: Normocephalic and atraumatic.  Eyes:     Conjunctiva/sclera: Conjunctivae normal.  Cardiovascular:     Rate and Rhythm: Normal rate and regular rhythm.     Heart sounds: Normal heart sounds.  Pulmonary:     Effort: Pulmonary effort is normal.     Breath sounds: Normal breath sounds.  Abdominal:     General: Bowel sounds are normal.     Palpations: Abdomen is soft.  Musculoskeletal:     Cervical back: Normal range of motion.  Lymphadenopathy:     Cervical: No cervical adenopathy.  Skin:    General: Skin is warm and dry.     Capillary Refill: Capillary refill takes less than 2 seconds.  Neurological:     Mental Status: She is alert and oriented to person, place, and time.  Psychiatric:        Behavior: Behavior normal.      Musculoskeletal Exam: C-spine was in good range of motion.  She had left trapezius spasm.  Shoulder joints, elbow joints, wrist joints with good range of motion.  She had incomplete extension of her left third fourth and fifth fingers.  Hip joints, knee joints, ankles with good range of motion.  She had no synovitis on examination.  CDAI Exam: CDAI Score: 0.4  Patient Global: 2 mm; Provider Global: 2 mm Swollen: 0 ; Tender: 0  Joint Exam 02/12/2021   No joint exam has been documented for this visit   There is currently no information documented on the homunculus. Go to the Rheumatology activity and complete the homunculus joint exam.  Investigation: No additional findings.  Imaging: No results found.  Recent Labs: Lab Results  Component Value Date   WBC 5.5 11/04/2020   HGB 14.3 11/04/2020   PLT 322  11/04/2020   NA 139 10/14/2020   K 4.4 10/14/2020   CL 104 10/14/2020   CO2 23 10/14/2020   GLUCOSE 104 (H) 10/14/2020   BUN 6 10/14/2020   CREATININE 0.65 10/14/2020   BILITOT <0.2 10/14/2020   ALKPHOS 94 10/14/2020   AST 11 10/14/2020   ALT 11 10/14/2020   PROT 7.8 10/14/2020   ALBUMIN 4.3 10/14/2020   CALCIUM 9.6 10/14/2020   GFRAA 122 10/14/2020   QFTBGOLDPLUS NEGATIVE 07/10/2020    Speciality Comments: No specialty comments available.  Procedures:  No procedures performed Allergies: Patient has no known allergies.   Assessment / Plan:     Visit Diagnoses: Rheumatoid arthritis of multiple sites without rheumatoid factor (HCC)-she had no synovitis on examination.  She is doing well on the current combination.  She denies any joint swelling or discomfort.  High risk medication use - Humira 40 mg subcu every other week, methotrexate 0.8 mL subcu weekly, folic acid 2 mg p.o. daily - Plan: COMPLETE METABOLIC PANEL WITH GFR today and then every 3 months to monitor for drug toxicity.  She had CBC done by her PCP today.  TB gold was negative on  July 10, 2020.  She has been advised to discontinue Humira in case she develops an infection.  And resume medication once infection resolves.  Yearly skin examination with a dermatologist was advised to monitor for nonmelanoma skin cancer.  Immunization recommendations were also placed in the AVS.  Primary osteoarthritis of both knees-she denies any discomfort currently.  History of hidradenitis suppurativa-resolved.  Fibromyalgia-she denies any recent flares of fibromyalgia.  Trapezius muscle spasm-she has a left trapezius spasm.  I demonstrated some of the exercises in the office.  Other fatigue-related to fibromyalgia.  History of hypertension-her blood pressure is elevated.  Patient states that she was seen by her PCP this morning.  She has not taken her blood pressure medication today.  History of hyperlipidemia-increased risk  of heart disease with rheumatoid arthritis was discussed.  Dietary modifications and exercises were discussed.  Handout was placed in the AVS.  Smoker-she has chronic smokers cough.  Smoking cessation was discussed.  Orders: Orders Placed This Encounter  Procedures  . COMPLETE METABOLIC PANEL WITH GFR   No orders of the defined types were placed in this encounter.     Follow-Up Instructions: Return in about 5 months (around 07/15/2021) for Rheumatoid arthritis.   Bo Merino, MD  Note - This record has been created using Editor, commissioning.  Chart creation errors have been sought, but may not always  have been located. Such creation errors do not reflect on  the standard of medical care.

## 2021-02-12 NOTE — Patient Instructions (Addendum)

## 2021-02-12 NOTE — Progress Notes (Signed)
This visit occurred during the SARS-CoV-2 public health emergency.  Safety protocols were in place, including screening questions prior to the visit, additional usage of staff PPE, and extensive cleaning of exam room while observing appropriate contact time as indicated for disinfecting solutions.  Subjective:     Patient ID: Shelby Robertson , female    DOB: 22-Jun-1971 , 50 y.o.   MRN: [id number]   Chief Complaint  Patient presents with  . Hypertension  . Diabetes    HPI  Shelby Robertson presents today for a diabetes, hyperlipidemia and hypertension follow-up.  She denies any changes to her health since the last visit and no concerns regarding health.  Her blood pressure today is 132/84.  She checks her blood pressure at home occasionally and reports that it is usually less than 130/80.  She denies chest pain, headaches, blurred vision, shortness of breath and palpitations.  Shelby Robertson's last A1C was 6.2.  She is not following a diabetic diet.  She reports eating 2 meals daily and does not chose healthy options.  She is walking her dog for exercise.  She drinks at least 64oz of water daily.  She reports compliance to medications and denies side effects.    She is scheduled to see the Rheumatologist today and she will see pain management on May 26th Dr. Arlice Colt.     Past Medical History:  Diagnosis Date  . Fibromyalgia   . Fibromyalgia   . Hypertension   . Osteoarthritis   . Rheumatoid arthritis(714.0)   . Rheumatoid arthritis(714.0)      Family History  Problem Relation Age of Onset  . Diabetes Mother   . Healthy Father   . Heart attack Maternal Grandmother   . Stroke Maternal Grandmother      Current Outpatient Medications:  .  acetaminophen (TYLENOL) 650 MG CR tablet, Take 1,300 mg by mouth every 8 (eight) hours as needed for pain., Disp: , Rfl:  .  Adalimumab 40 MG/0.8ML PNKT, INJECT 1 PEN INTO THE SKIN EVERY 14 DAYS, Disp: 2 each, Rfl: 2 .  albuterol (PROVENTIL) (2.5  MG/3ML) 0.083% nebulizer solution, Take 3 mLs (2.5 mg total) by nebulization every 6 (six) hours as needed for wheezing or shortness of breath., Disp: 75 mL, Rfl: 12 .  albuterol (VENTOLIN HFA) 108 (90 Base) MCG/ACT inhaler, Inhale 2 puffs into the lungs every 4 (four) hours as needed for wheezing or shortness of breath (cough, shortness of breath or wheezing.)., Disp: 6.7 g, Rfl: 4 .  amLODipine (NORVASC) 10 MG tablet, TAKE 1 TABLET BY MOUTH EVERY DAY, Disp: 90 tablet, Rfl: 1 .  aspirin 325 MG tablet, Take 325 mg by mouth every 6 (six) hours as needed (pain)., Disp: , Rfl:  .  atorvastatin (LIPITOR) 10 MG tablet, TAKE 1 TABLET BY MOUTH EVERYDAY AT BEDTIME, Disp: 90 tablet, Rfl: 0 .  B-D TB SYRINGE 1CC/27GX1/2" 27G X 1/2" 1 ML MISC, USE 1 SYRINGE TO INJECT METHOTREXATE INTO THE SKIN ONCE A WEEK., Disp: 12 each, Rfl: 3 .  blood glucose meter kit and supplies KIT, Dispense based on patient and insurance preference. Use up to two times daily as directed. (FOR ICD-9 250.00, 250.01)., Disp: 1 each, Rfl: 0 .  chlorhexidine (HIBICLENS) 4 % external liquid, Apply topically daily as needed. Dilute 10-15 mL in water, Use daily when bathing for 1-2 weeks (Patient not taking: Reported on 02/12/2021), Disp: 120 mL, Rfl: 0 .  diphenhydrAMINE (BENADRYL) 25 MG tablet, Take 25 mg by mouth every 6 (  six) hours as needed for itching, allergies or sleep., Disp: , Rfl:  .  DULoxetine (CYMBALTA) 60 MG capsule, Take 60 mg by mouth daily., Disp: , Rfl:  .  ezetimibe (ZETIA) 10 MG tablet, Take 1 tablet (10 mg total) by mouth daily., Disp: 90 tablet, Rfl: 1 .  folic acid (FOLVITE) 1 MG tablet, Take 2 tablets (2 mg total) by mouth daily., Disp: 180 tablet, Rfl: 3 .  hydrochlorothiazide (HYDRODIURIL) 12.5 MG tablet, Take 1 tablet (12.5 mg total) by mouth daily as needed (fluid/edema)., Disp: 90 tablet, Rfl: 1 .  HYDROcodone-acetaminophen (NORCO) 10-325 MG tablet, Take 1 tablet by mouth every 6 (six) hours., Disp: , Rfl:  .   hydrOXYzine (ATARAX/VISTARIL) 25 MG tablet, Take 1 tablet (25 mg total) by mouth every 8 (eight) hours as needed for itching., Disp: 30 tablet, Rfl: 0 .  Lancets (ONETOUCH DELICA PLUS PRFFMB84Y) MISC, 2 (two) times daily. use for testing, Disp: , Rfl:  .  metFORMIN (GLUCOPHAGE) 500 MG tablet, Take 1 tablet (500 mg total) by mouth 2 (two) times daily with a meal., Disp: 180 tablet, Rfl: 1 .  methotrexate 50 MG/2ML injection, Inject 0.8 mls (20 mg total) into the skin weekly., Disp: 2 mL, Rfl:  .  metoprolol tartrate (LOPRESSOR) 50 MG tablet, Take 1 tablet (50 mg total) by mouth 2 (two) times daily. Please schedule appointment for refills., Disp: 180 tablet, Rfl: 1 .  ONETOUCH VERIO test strip, TEST TWICE DAILY, Disp: 100 strip, Rfl: 3 .  tiZANidine (ZANAFLEX) 4 MG capsule, Take 4 mg by mouth every 6 (six) hours., Disp: , Rfl:  .  triamcinolone ointment (KENALOG) 0.1 %, Apply topically., Disp: , Rfl:    No Known Allergies   Review of Systems  Constitutional: Negative.   Respiratory: Negative.   Cardiovascular: Positive for leg swelling (occasional ankle edema ).  Psychiatric/Behavioral: Negative.      Today's Vitals   02/12/21 0907  BP: 132/84  Pulse: 88  Temp: 98 F (36.7 C)  TempSrc: Oral  Weight: 289 lb 12.8 oz (131.5 kg)  Height: 5' 7.4" (1.712 m)  PainSc: 0-No pain   Body mass index is 44.85 kg/m.   Objective:  Physical Exam Constitutional:      Appearance: She is obese.  Cardiovascular:     Rate and Rhythm: Normal rate and regular rhythm.     Pulses: Normal pulses.     Heart sounds: Normal heart sounds.  Pulmonary:     Effort: Pulmonary effort is normal.     Breath sounds: Normal breath sounds.  Neurological:     Mental Status: She is alert.  Psychiatric:        Mood and Affect: Mood normal.        Thought Content: Thought content normal.        Judgment: Judgment normal.         Assessment And Plan:     1. Essential hypertension . B/P is fairly controlled.   . CMP ordered to check renal function.  . The importance of regular exercise and dietary modification was stressed to the patient.  . Stressed importance of losing ten percent of her body weight to help with B/P control.   2. Controlled type 2 diabetes mellitus with complication, without long-term current use of insulin (HCC)  Chronic, stable  I will refer her to ophthalmology as she has not had a visit in over a year and has seen an optometrist  Diabetic foot exam done with normal  sensation, she does have dry scaly skin to bilateral feet - Ambulatory referral to Ophthalmology - Lipid panel - CBC - Hemoglobin A1c  3. Encounter for screening colonoscopy  According to USPTF Colorectal cancer Screening guidelines. Colonoscopy is recommended every 10 years, starting at age 65 years.  Will refer to GI for colon cancer screening. - Ambulatory referral to Gastroenterology  4. Rash and nonspecific skin eruption  Bilateral feet with scaly rash  She is to use over the counter lamisil spray to her feet  Encouraged to avoid pumice stones when cleaning feet or scraper to her feet to avoid open skin areas increasing risk for infection   Patient was given opportunity to ask questions. Patient verbalized understanding of the plan and was able to repeat key elements of the plan. All questions were answered to their satisfaction.  Minette Brine, FNP   I, Minette Brine, FNP, have reviewed all documentation for this visit. The documentation on 02/12/21 for the exam, diagnosis, procedures, and orders are all accurate and complete.   IF YOU HAVE BEEN REFERRED TO A SPECIALIST, IT MAY TAKE 1-2 WEEKS TO SCHEDULE/PROCESS THE REFERRAL. IF YOU HAVE NOT HEARD FROM US/SPECIALIST IN TWO WEEKS, PLEASE GIVE [place] A CALL AT [phone] X [phone].   THE PATIENT IS ENCOURAGED TO PRACTICE SOCIAL DISTANCING DUE TO THE COVID-19 PANDEMIC.

## 2021-02-13 LAB — COMPLETE METABOLIC PANEL WITH GFR
AG Ratio: 1.1 (calc) (ref 1.0–2.5)
ALT: 9 U/L (ref 6–29)
AST: 9 U/L — ABNORMAL LOW (ref 10–35)
Albumin: 4 g/dL (ref 3.6–5.1)
Alkaline phosphatase (APISO): 86 U/L (ref 31–125)
BUN: 10 mg/dL (ref 7–25)
CO2: 27 mmol/L (ref 20–32)
Calcium: 9.3 mg/dL (ref 8.6–10.2)
Chloride: 104 mmol/L (ref 98–110)
Creat: 0.56 mg/dL (ref 0.50–1.10)
GFR, Est African American: 128 mL/min/{1.73_m2} (ref >=60)
GFR, Est Non African American: 110 mL/min/{1.73_m2} (ref >=60)
Globulin: 3.8 g/dL (calc) — ABNORMAL HIGH (ref 1.9–3.7)
Glucose, Bld: 117 mg/dL — ABNORMAL HIGH (ref 65–99)
Potassium: 4.2 mmol/L (ref 3.5–5.3)
Sodium: 138 mmol/L (ref 135–146)
Total Bilirubin: 0.2 mg/dL (ref 0.2–1.2)
Total Protein: 7.8 g/dL (ref 6.1–8.1)

## 2021-02-13 LAB — LIPID PANEL
Chol/HDL Ratio: 5.8 ratio — ABNORMAL HIGH (ref 0.0–4.4)
Cholesterol, Total: 216 mg/dL — ABNORMAL HIGH (ref 100–199)
HDL: 37 mg/dL — ABNORMAL LOW (ref >39)
LDL Chol Calc (NIH): 138 mg/dL — ABNORMAL HIGH (ref 0–99)
Triglycerides: 226 mg/dL — ABNORMAL HIGH (ref 0–149)
VLDL Cholesterol Cal: 41 mg/dL — ABNORMAL HIGH (ref 5–40)

## 2021-02-13 LAB — CBC
Hematocrit: 43.6 % (ref 34.0–46.6)
Hemoglobin: 14.4 g/dL (ref 11.1–15.9)
MCH: 29.7 pg (ref 26.6–33.0)
MCHC: 33 g/dL (ref 31.5–35.7)
MCV: 90 fL (ref 79–97)
Platelets: 384 10*3/uL (ref 150–450)
RBC: 4.85 x10E6/uL (ref 3.77–5.28)
RDW: 14.5 % (ref 11.7–15.4)
WBC: 7.1 10*3/uL (ref 3.4–10.8)

## 2021-02-13 LAB — HEMOGLOBIN A1C
Est. average glucose Bld gHb Est-mCnc: 137 mg/dL
Hgb A1c MFr Bld: 6.4 % — ABNORMAL HIGH (ref 4.8–5.6)

## 2021-02-13 NOTE — Progress Notes (Signed)
CMP is normal.  Glucose is mildly elevated as it was not a fasting sample.

## 2021-02-16 ENCOUNTER — Other Ambulatory Visit (HOSPITAL_COMMUNITY): Payer: Self-pay

## 2021-02-16 ENCOUNTER — Telehealth: Payer: Self-pay

## 2021-02-16 ENCOUNTER — Telehealth: Payer: Medicare HMO

## 2021-02-16 NOTE — Telephone Encounter (Signed)
  Chronic Care Management   Outreach Note  02/16/2021 Name: Shelby Robertson MRN: [id number] DOB: [birth year]  Referred by: Minette Brine, FNP Reason for referral : Chronic Care Management (RN CM FU Call Attempt )   An unsuccessful telephone outreach was attempted today. The patient was referred to the case management team for assistance with care management and care coordination.   Follow Up Plan: Telephone follow up appointment with care management team member scheduled for: 03/23/21  Barb Merino, RN, BSN, CCM Care Management Coordinator [place] [facility]  Direct Phone: [phone]

## 2021-02-23 ENCOUNTER — Other Ambulatory Visit (HOSPITAL_COMMUNITY): Payer: Self-pay

## 2021-02-25 ENCOUNTER — Telehealth: Payer: Self-pay | Admitting: Nurse Practitioner

## 2021-02-25 NOTE — Telephone Encounter (Signed)
Tried calling patient to  schedule Medicare Annual Wellness Visit (AWV) either virtually or in office.  No answer mail box full  Last AWV 01/16/20  please schedule at anytime with [facility]    This should be a 45 minute visit.

## 2021-02-27 DIAGNOSIS — M797 Fibromyalgia: Secondary | ICD-10-CM | POA: Diagnosis not present

## 2021-03-04 ENCOUNTER — Telehealth: Payer: Self-pay | Admitting: Nurse Practitioner

## 2021-03-04 NOTE — Telephone Encounter (Signed)
Tried calling patient to schedule Medicare Annual Wellness Visit (AWV) either virtually or in office.  No answer   Last AWV 01/16/20  please schedule at anytime with [facility]    This should be a 45 minute visit.  

## 2021-03-10 DIAGNOSIS — M25512 Pain in left shoulder: Secondary | ICD-10-CM | POA: Diagnosis not present

## 2021-03-10 DIAGNOSIS — I10 Essential (primary) hypertension: Secondary | ICD-10-CM | POA: Diagnosis not present

## 2021-03-10 DIAGNOSIS — M545 Low back pain, unspecified: Secondary | ICD-10-CM | POA: Diagnosis not present

## 2021-03-10 DIAGNOSIS — M79671 Pain in right foot: Secondary | ICD-10-CM | POA: Diagnosis not present

## 2021-03-10 DIAGNOSIS — F1120 Opioid dependence, uncomplicated: Secondary | ICD-10-CM | POA: Diagnosis not present

## 2021-03-10 DIAGNOSIS — M79604 Pain in right leg: Secondary | ICD-10-CM | POA: Diagnosis not present

## 2021-03-10 DIAGNOSIS — G894 Chronic pain syndrome: Secondary | ICD-10-CM | POA: Diagnosis not present

## 2021-03-10 DIAGNOSIS — M542 Cervicalgia: Secondary | ICD-10-CM | POA: Diagnosis not present

## 2021-03-10 DIAGNOSIS — G8929 Other chronic pain: Secondary | ICD-10-CM | POA: Diagnosis not present

## 2021-03-10 DIAGNOSIS — M79605 Pain in left leg: Secondary | ICD-10-CM | POA: Diagnosis not present

## 2021-03-10 DIAGNOSIS — E1141 Type 2 diabetes mellitus with diabetic mononeuropathy: Secondary | ICD-10-CM | POA: Diagnosis not present

## 2021-03-10 DIAGNOSIS — M79672 Pain in left foot: Secondary | ICD-10-CM | POA: Diagnosis not present

## 2021-03-10 DIAGNOSIS — Z79891 Long term (current) use of opiate analgesic: Secondary | ICD-10-CM | POA: Diagnosis not present

## 2021-03-10 DIAGNOSIS — M1712 Unilateral primary osteoarthritis, left knee: Secondary | ICD-10-CM | POA: Diagnosis not present

## 2021-03-12 ENCOUNTER — Other Ambulatory Visit (HOSPITAL_COMMUNITY): Payer: Self-pay

## 2021-03-12 MED FILL — Adalimumab Auto-injector Kit 40 MG/0.8ML: SUBCUTANEOUS | 28 days supply | Qty: 2 | Fill #1 | Status: AC

## 2021-03-13 DIAGNOSIS — M797 Fibromyalgia: Secondary | ICD-10-CM | POA: Diagnosis not present

## 2021-03-14 DIAGNOSIS — M797 Fibromyalgia: Secondary | ICD-10-CM | POA: Diagnosis not present

## 2021-03-16 DIAGNOSIS — M797 Fibromyalgia: Secondary | ICD-10-CM | POA: Diagnosis not present

## 2021-03-20 DIAGNOSIS — M797 Fibromyalgia: Secondary | ICD-10-CM | POA: Diagnosis not present

## 2021-03-21 DIAGNOSIS — M797 Fibromyalgia: Secondary | ICD-10-CM | POA: Diagnosis not present

## 2021-03-23 ENCOUNTER — Telehealth: Payer: Self-pay

## 2021-03-23 ENCOUNTER — Telehealth: Payer: Medicare HMO

## 2021-03-23 NOTE — Telephone Encounter (Signed)
  Care Management   Follow Up Note   03/23/2021 Name: Shelby Robertson MRN: [id number] DOB: 1971/01/16   Referred by: Minette Brine, FNP Reason for referral : Chronic Care Management (RNCM Follow up call - 2nd attempt )   A second unsuccessful telephone outreach was attempted today. The patient was referred to the case management team for assistance with care management and care coordination.   Follow Up Plan: A HIPPA compliant phone message was left for the patient providing contact information and requesting a return call.   Barb Merino, RN, BSN, CCM Care Management Coordinator [place] [facility]  Direct Phone: [phone]

## 2021-03-26 ENCOUNTER — Other Ambulatory Visit: Payer: Self-pay | Admitting: Nurse Practitioner

## 2021-03-26 DIAGNOSIS — I10 Essential (primary) hypertension: Secondary | ICD-10-CM

## 2021-03-26 DIAGNOSIS — E782 Mixed hyperlipidemia: Secondary | ICD-10-CM

## 2021-03-26 DIAGNOSIS — R600 Localized edema: Secondary | ICD-10-CM

## 2021-03-26 DIAGNOSIS — E118 Type 2 diabetes mellitus with unspecified complications: Secondary | ICD-10-CM

## 2021-04-14 ENCOUNTER — Telehealth: Payer: Self-pay | Admitting: Nurse Practitioner

## 2021-04-14 NOTE — Telephone Encounter (Signed)
Tried calling patient to schedule Medicare Annual Wellness Visit (AWV) either virtually or in office.  No answer   Last AWV 01/16/20  please schedule at anytime with [facility]    This should be a 45 minute visit.

## 2021-04-17 DIAGNOSIS — M79671 Pain in right foot: Secondary | ICD-10-CM | POA: Diagnosis not present

## 2021-04-17 DIAGNOSIS — M79604 Pain in right leg: Secondary | ICD-10-CM | POA: Diagnosis not present

## 2021-04-17 DIAGNOSIS — M25512 Pain in left shoulder: Secondary | ICD-10-CM | POA: Diagnosis not present

## 2021-04-17 DIAGNOSIS — M1712 Unilateral primary osteoarthritis, left knee: Secondary | ICD-10-CM | POA: Diagnosis not present

## 2021-04-17 DIAGNOSIS — M545 Low back pain, unspecified: Secondary | ICD-10-CM | POA: Diagnosis not present

## 2021-04-17 DIAGNOSIS — Z79891 Long term (current) use of opiate analgesic: Secondary | ICD-10-CM | POA: Diagnosis not present

## 2021-04-17 DIAGNOSIS — M542 Cervicalgia: Secondary | ICD-10-CM | POA: Diagnosis not present

## 2021-04-17 DIAGNOSIS — M25521 Pain in right elbow: Secondary | ICD-10-CM | POA: Diagnosis not present

## 2021-04-17 DIAGNOSIS — F1120 Opioid dependence, uncomplicated: Secondary | ICD-10-CM | POA: Diagnosis not present

## 2021-04-17 DIAGNOSIS — G4733 Obstructive sleep apnea (adult) (pediatric): Secondary | ICD-10-CM | POA: Diagnosis not present

## 2021-04-17 DIAGNOSIS — M79672 Pain in left foot: Secondary | ICD-10-CM | POA: Diagnosis not present

## 2021-04-17 DIAGNOSIS — I10 Essential (primary) hypertension: Secondary | ICD-10-CM | POA: Diagnosis not present

## 2021-04-17 DIAGNOSIS — G894 Chronic pain syndrome: Secondary | ICD-10-CM | POA: Diagnosis not present

## 2021-04-17 DIAGNOSIS — G8929 Other chronic pain: Secondary | ICD-10-CM | POA: Diagnosis not present

## 2021-04-24 ENCOUNTER — Telehealth: Payer: Self-pay

## 2021-04-24 NOTE — Telephone Encounter (Signed)
Mailbox full

## 2021-04-24 NOTE — Telephone Encounter (Signed)
-----   Message from Minette Brine, [place] sent at 04/24/2021 12:37 AM EDT ----- Your cholesterol levels are better but still elevated. Increase your dose of atorvastatin to 20 mg daily. HgbA1c is up to 6.4 from 6.2 has anything changed in your diet?

## 2021-04-27 ENCOUNTER — Other Ambulatory Visit: Payer: Self-pay

## 2021-04-27 DIAGNOSIS — E782 Mixed hyperlipidemia: Secondary | ICD-10-CM

## 2021-04-27 MED ORDER — ATORVASTATIN CALCIUM 20 MG PO TABS: 20.0000 mg | ORAL_TABLET | Freq: Every day | ORAL | 2 refills | Status: DC

## 2021-04-28 NOTE — Progress Notes (Signed)
Be sure she has a follow up in September if not before

## 2021-04-30 ENCOUNTER — Telehealth: Payer: Medicare HMO

## 2021-04-30 ENCOUNTER — Ambulatory Visit: Payer: Self-pay

## 2021-04-30 ENCOUNTER — Ambulatory Visit: Payer: Medicare HMO

## 2021-04-30 ENCOUNTER — Telehealth: Payer: Self-pay

## 2021-04-30 DIAGNOSIS — M0609 Rheumatoid arthritis without rheumatoid factor, multiple sites: Secondary | ICD-10-CM

## 2021-04-30 DIAGNOSIS — R7303 Prediabetes: Secondary | ICD-10-CM

## 2021-04-30 DIAGNOSIS — E782 Mixed hyperlipidemia: Secondary | ICD-10-CM

## 2021-04-30 DIAGNOSIS — I10 Essential (primary) hypertension: Secondary | ICD-10-CM

## 2021-04-30 NOTE — Chronic Care Management (AMB) (Signed)
  Care Management   Follow Up Note   04/30/2021 Name: Shelby Robertson MRN: [id number] DOB: [birth year]   Referred by: Minette Brine, FNP Reason for referral : Chronic Care Management (RN CM Follow up - 3rd attempt )   Third unsuccessful telephone outreach was attempted today. The patient was referred to the case management team for assistance with care management and care coordination. The patient's primary care provider has been notified of our unsuccessful attempts to make or maintain contact with the patient. The care management team is pleased to engage with this patient at any time in the future should he/she be interested in assistance from the care management team.   Follow Up Plan: We have been unable to make contact with the patient for follow up. The care management team is available to follow up with the patient after provider conversation with the patient regarding recommendation for care management engagement and subsequent re-referral to the care management team.   Barb Merino, RN, BSN, CCM Care Management Coordinator [place] [facility]  Direct Phone: [phone]

## 2021-04-30 NOTE — Telephone Encounter (Signed)
This nurse attempted to call patient three times for virtual AWV. Called at [phone], [phone], [phone]. I also sent her a link with no response. The phone's mailbox was full and could not leave any messages

## 2021-05-05 ENCOUNTER — Telehealth: Payer: Self-pay | Admitting: Nurse Practitioner

## 2021-05-05 NOTE — Telephone Encounter (Signed)
Left message for patient to call back and schedule Medicare Annual Wellness Visit (AWV) either virtually or in office.   Last AWV 01/16/20  please schedule at anytime with [facility]    This should be a 45 minute visit.

## 2021-05-15 ENCOUNTER — Other Ambulatory Visit (HOSPITAL_COMMUNITY): Payer: Self-pay

## 2021-05-15 DIAGNOSIS — F1120 Opioid dependence, uncomplicated: Secondary | ICD-10-CM | POA: Diagnosis not present

## 2021-05-15 DIAGNOSIS — G894 Chronic pain syndrome: Secondary | ICD-10-CM | POA: Diagnosis not present

## 2021-05-15 DIAGNOSIS — M25512 Pain in left shoulder: Secondary | ICD-10-CM | POA: Diagnosis not present

## 2021-05-15 DIAGNOSIS — M79604 Pain in right leg: Secondary | ICD-10-CM | POA: Diagnosis not present

## 2021-05-15 DIAGNOSIS — M25571 Pain in right ankle and joints of right foot: Secondary | ICD-10-CM | POA: Diagnosis not present

## 2021-05-15 DIAGNOSIS — M5412 Radiculopathy, cervical region: Secondary | ICD-10-CM | POA: Diagnosis not present

## 2021-05-15 DIAGNOSIS — M79605 Pain in left leg: Secondary | ICD-10-CM | POA: Diagnosis not present

## 2021-05-15 DIAGNOSIS — M25561 Pain in right knee: Secondary | ICD-10-CM | POA: Diagnosis not present

## 2021-05-15 DIAGNOSIS — M79671 Pain in right foot: Secondary | ICD-10-CM | POA: Diagnosis not present

## 2021-05-15 DIAGNOSIS — M25521 Pain in right elbow: Secondary | ICD-10-CM | POA: Diagnosis not present

## 2021-05-15 DIAGNOSIS — Z79891 Long term (current) use of opiate analgesic: Secondary | ICD-10-CM | POA: Diagnosis not present

## 2021-05-15 DIAGNOSIS — M79672 Pain in left foot: Secondary | ICD-10-CM | POA: Diagnosis not present

## 2021-05-15 DIAGNOSIS — G8929 Other chronic pain: Secondary | ICD-10-CM | POA: Diagnosis not present

## 2021-05-15 MED FILL — Adalimumab Auto-injector Kit 40 MG/0.8ML: SUBCUTANEOUS | 28 days supply | Qty: 2 | Fill #2 | Status: CN

## 2021-05-19 ENCOUNTER — Telehealth: Payer: Self-pay | Admitting: Nurse Practitioner

## 2021-05-19 NOTE — Telephone Encounter (Signed)
Left message for patient to call back and schedule Medicare Annual Wellness Visit (AWV) either virtually or in office.   Last AWV 01/16/20  please schedule at anytime with [facility]    This should be a 45 minute visit.

## 2021-05-21 ENCOUNTER — Other Ambulatory Visit: Payer: Self-pay | Admitting: Nurse Practitioner

## 2021-05-21 DIAGNOSIS — Z1231 Encounter for screening mammogram for malignant neoplasm of breast: Secondary | ICD-10-CM

## 2021-05-23 ENCOUNTER — Other Ambulatory Visit: Payer: Self-pay | Admitting: Nurse Practitioner

## 2021-05-23 DIAGNOSIS — I10 Essential (primary) hypertension: Secondary | ICD-10-CM

## 2021-05-26 ENCOUNTER — Ambulatory Visit (HOSPITAL_COMMUNITY)
Admission: EM | Admit: 2021-05-26 | Discharge: 2021-05-26 | Disposition: A | Discharge status: Home / Self Care | Payer: Medicare HMO | Attending: Physician Assistant | Admitting: Physician Assistant

## 2021-05-26 ENCOUNTER — Encounter (HOSPITAL_COMMUNITY): Payer: Self-pay | Admitting: Emergency Medicine

## 2021-05-26 DIAGNOSIS — M79602 Pain in left arm: Secondary | ICD-10-CM | POA: Diagnosis not present

## 2021-05-26 DIAGNOSIS — M069 Rheumatoid arthritis, unspecified: Secondary | ICD-10-CM

## 2021-05-26 DIAGNOSIS — M25561 Pain in right knee: Secondary | ICD-10-CM | POA: Diagnosis not present

## 2021-05-26 DIAGNOSIS — M25512 Pain in left shoulder: Secondary | ICD-10-CM | POA: Diagnosis not present

## 2021-05-26 DIAGNOSIS — M79671 Pain in right foot: Secondary | ICD-10-CM

## 2021-05-26 MED ORDER — KETOROLAC TROMETHAMINE 30 MG/ML IJ SOLN
INTRAMUSCULAR | Status: AC
  Filled 2021-05-26: qty 1

## 2021-05-26 MED ORDER — KETOROLAC TROMETHAMINE 30 MG/ML IJ SOLN
30.0000 mg | Freq: Once | INTRAMUSCULAR | Status: AC
  Administered 2021-05-26: 30 mg via INTRAMUSCULAR

## 2021-05-26 MED ORDER — PREDNISONE 10 MG (21) PO TBPK: ORAL_TABLET | ORAL | 0 refills | Status: DC

## 2021-05-26 NOTE — Discharge Instructions (Addendum)
We gave you Toradol today so you should not take additional NSAIDs including aspirin, ibuprofen/Advil, naproxen/Aleve.  Please start prednisone taper tomorrow.  While you are taking this medication you should not take additional NSAIDs.  Use heat, range of motion exercises, stretch for additional symptom relief.  Follow-up with your rheumatologist.

## 2021-05-26 NOTE — ED Triage Notes (Signed)
Pt is present today with left shoulder pain, right foot pain, and right knee pain. Pt states that she does have a hx of arthritis. Pt states that her flare started x4 days ago

## 2021-05-26 NOTE — ED Notes (Signed)
Provider made aware of newest BP

## 2021-05-26 NOTE — ED Provider Notes (Signed)
[place]    CSN: [id number] Arrival date & time: 05/26/21  1812      History   Chief Complaint Chief Complaint  Patient presents with   Shoulder Pain    HPI Shelby Robertson is a 50 y.o. female.   Patient presents today with a 4-day history of rheumatoid arthritis flare.  She reports pain in her left shoulder, left upper arm, right foot, right knee.  Pain is rated 10 on a 0-10 pain scale, localized to different affected areas, described as intense aching with periodic sharp pains, worse with certain movements, no alleviating factors identified.  She denies any known injury or increase in activity prior to symptom onset.  She is currently prescribed Humira and methotrexate but has not been taking methotrexate as she ran out of needles and has not had a dose in approximately 2 weeks.  She believes that this could be the cause of her flare.  She denies any fever, nausea, vomiting, headache, dizziness.  She has been using over-the-counter medications including Tylenol and occasional doses of ibuprofen without improvement of symptoms.  Last dose of ibuprofen was at 200 mg tablet several hours ago.  She has diabetic but reports that her blood sugars are well controlled; last A1c was 6.4% on 02/12/2021.  She reports previously receiving Toradol and prednisone to manage flares and is requesting this medication regimen if appropriate today.   Past Medical History:  Diagnosis Date   Fibromyalgia    Fibromyalgia    Hypertension    Osteoarthritis    Rheumatoid arthritis(714.0)    Rheumatoid arthritis(714.0)     Patient Active Problem List   Diagnosis Date Noted   Controlled type 2 diabetes mellitus with complication, without long-term current use of insulin ([place]) 10/14/2020   Sepsis ([place]) 09/17/2019   AMS (altered mental status) 09/17/2019   Morbid obesity ([place]) 01/10/2019   Prediabetes 01/10/2019   Tobacco use disorder, continuous 01/10/2019   Soft tissue swelling of back  01/10/2019   Fibromyalgia 07/27/2017   Other fatigue 07/27/2017   History of hidradenitis suppurativa 07/27/2017   History of hypertension 07/27/2017   History of hyperlipidemia 07/27/2017   Smoker 07/27/2017   High risk medication use 08/21/2016   Rheumatoid arthritis of multiple sites without rheumatoid factor ([place]) 08/12/2016   ARTHRITIS, GENERALIZED 07/14/2010   HYPERTENSION, BENIGN ESSENTIAL 03/04/2009   POLYURIA 03/04/2009   FIBROMYALGIA 11/09/2007   HIDRADENITIS SUPPURATIVA 07/11/2007   Mixed hyperlipidemia 06/28/2007   LEG EDEMA, BILATERAL 05/05/2007    History reviewed. No pertinent surgical history.  OB History   No obstetric history on file.      Home Medications    Prior to Admission medications   Medication Sig Start Date End Date Taking? Authorizing Provider  predniSONE (STERAPRED UNI-PAK 21 TAB) 10 MG (21) TBPK tablet As directed 05/26/21  Yes Rupa Lagan K, PA-C  acetaminophen (TYLENOL) 650 MG CR tablet Take 1,300 mg by mouth every 8 (eight) hours as needed for pain.    [provider]  Adalimumab 40 MG/0.8ML PNKT INJECT 1 PEN INTO THE SKIN EVERY 14 DAYS 11/27/20 11/27/21  Ofilia Neas, PA-C  albuterol (PROVENTIL) (2.5 MG/3ML) 0.083% nebulizer solution Take 3 mLs (2.5 mg total) by nebulization every 6 (six) hours as needed for wheezing or shortness of breath. 10/11/19   Robyn Haber, MD  albuterol (VENTOLIN HFA) 108 (90 Base) MCG/ACT inhaler Inhale 2 puffs into the lungs every 4 (four) hours as needed for wheezing or shortness of breath (cough,  shortness of breath or wheezing.). 10/11/19   Robyn Haber, MD  amLODipine (NORVASC) 10 MG tablet TAKE 1 TABLET BY MOUTH EVERY DAY 03/27/21   Minette Brine, FNP  aspirin 325 MG tablet Take 325 mg by mouth every 6 (six) hours as needed (pain).    [provider]  atorvastatin (LIPITOR) 20 MG tablet Take 1 tablet (20 mg total) by mouth daily. 04/27/21 04/27/22  Minette Brine, FNP  B-D TB SYRINGE  1CC/27GX1/2" 27G X 1/2" 1 ML MISC USE 1 SYRINGE TO INJECT METHOTREXATE INTO THE SKIN ONCE A WEEK. 07/23/19   Bo Merino, MD  blood glucose meter kit and supplies KIT Dispense based on patient and insurance preference. Use up to two times daily as directed. (FOR ICD-9 250.00, 250.01). 01/17/20   Minette Brine, FNP  chlorhexidine (HIBICLENS) 4 % external liquid Apply topically daily as needed. Dilute 10-15 mL in water, Use daily when bathing for 1-2 weeks Patient not taking: Reported on 02/12/2021 12/01/20   Melynda Ripple, MD  diphenhydrAMINE (BENADRYL) 25 MG tablet Take 25 mg by mouth every 6 (six) hours as needed for itching, allergies or sleep.    [provider]  DULoxetine (CYMBALTA) 60 MG capsule Take 60 mg by mouth daily.    [provider]  ezetimibe (ZETIA) 10 MG tablet TAKE 1 TABLET BY MOUTH EVERY DAY 03/27/21   Minette Brine, FNP  folic acid (FOLVITE) 1 MG tablet Take 2 tablets (2 mg total) by mouth daily. 02/07/20   Ofilia Neas, PA-C  hydrochlorothiazide (HYDRODIURIL) 12.5 MG tablet TAKE 1 TABLET (12.5 MG TOTAL) BY MOUTH DAILY AS NEEDED (FLUID/EDEMA). 03/27/21   Minette Brine, FNP  HYDROcodone-acetaminophen (NORCO) 10-325 MG tablet Take 1 tablet by mouth every 6 (six) hours. 08/20/19   [provider]  hydrOXYzine (ATARAX/VISTARIL) 25 MG tablet Take 1 tablet (25 mg total) by mouth every 8 (eight) hours as needed for itching. 05/15/20   Lamptey, Myrene Galas, MD  Lancets (ONETOUCH DELICA PLUS [id number]) MISC 2 (two) times daily. use for testing 01/17/20   [provider]  metFORMIN (GLUCOPHAGE) 500 MG tablet TAKE 1 TABLET BY MOUTH 2 TIMES DAILY WITH A MEAL. 03/27/21   Minette Brine, FNP  methotrexate 50 MG/2ML injection Inject 0.8 mls (20 mg total) into the skin weekly. 02/07/20   Ofilia Neas, PA-C  metoprolol tartrate (LOPRESSOR) 50 MG tablet TAKE 1 TABLET (50 MG TOTAL) BY MOUTH 2 (TWO) TIMES DAILY. PLEASE SCHEDULE APPOINTMENT FOR REFILLS. 05/25/21   Minette Brine, FNP  [facility] VERIO test strip TEST TWICE DAILY 02/11/20   Minette Brine, FNP  tiZANidine (ZANAFLEX) 4 MG capsule Take 4 mg by mouth every 6 (six) hours. 10/15/19   [provider]  triamcinolone ointment (KENALOG) 0.1 % Apply topically. 09/30/20   [provider]    Family History Family History  Problem Relation Age of Onset   Diabetes Mother    Healthy Father    Heart attack Maternal Grandmother    Stroke Maternal Grandmother     Social History Social History   Tobacco Use   Smoking status: Every Day    Packs/day: 0.50    Years: 20.00    Pack years: 10.00    Types: Cigarettes   Smokeless tobacco: Never  Vaping Use   Vaping Use: Never used  Substance Use Topics   Alcohol use: Not Currently   Drug use: Not Currently    Types: Hydrocodone     Allergies   Patient has no known  allergies.   Review of Systems Review of Systems  Constitutional:  Positive for activity change. Negative for appetite change, fatigue and fever.  Respiratory:  Negative for cough and shortness of breath.   Cardiovascular:  Negative for chest pain.  Gastrointestinal:  Negative for abdominal pain, diarrhea, nausea and vomiting.  Musculoskeletal:  Positive for arthralgias, back pain, myalgias and neck pain.  Neurological:  Negative for dizziness, weakness, light-headedness, numbness and headaches.    Physical Exam Triage Vital Signs ED Triage Vitals  Enc Vitals Group     BP 05/26/21 1840 (!) 156/100     Pulse Rate 05/26/21 1840 (!) 109     Resp 05/26/21 1840 18     Temp 05/26/21 1840 98.2 F (36.8 C)     Temp Source 05/26/21 1840 Oral     SpO2 05/26/21 1840 95 %     Weight --      Height --      Head Circumference --      Peak Flow --      Pain Score 05/26/21 1838 10     Pain Loc --      Pain Edu? --      Excl. in [place]? --    No data found.  Updated Vital Signs BP (!) 156/100   Pulse (!) 109   Temp 98.2 F (36.8 C) (Oral)   Resp 18   SpO2 95%    Visual Acuity Right Eye Distance:   Left Eye Distance:   Bilateral Distance:    Right Eye Near:   Left Eye Near:    Bilateral Near:     Physical Exam Vitals reviewed.  Constitutional:      General: She is awake. She is not in acute distress.    Appearance: Normal appearance. She is not ill-appearing.     Comments: Very pleasant female appears stated age no acute distress sitting comfortably in exam room  HENT:     Head: Normocephalic and atraumatic.  Cardiovascular:     Rate and Rhythm: Normal rate and regular rhythm.     Heart sounds: Normal heart sounds, S1 normal and S2 normal. No murmur heard. Pulmonary:     Effort: Pulmonary effort is normal.     Breath sounds: Normal breath sounds. No wheezing, rhonchi or rales.     Comments: Clear to auscultation bilaterally Abdominal:     Palpations: Abdomen is soft.     Tenderness: There is no abdominal tenderness.  Musculoskeletal:     Left shoulder: Tenderness present. No swelling or bony tenderness. Decreased range of motion.     Cervical back: Normal range of motion and neck supple. Tenderness present. No bony tenderness. No spinous process tenderness or muscular tenderness.     Thoracic back: No tenderness or bony tenderness.     Lumbar back: No tenderness or bony tenderness.     Right knee: No swelling, effusion or bony tenderness. Tenderness present over the medial joint line.     Comments: Left shoulder: Mild to palpation over AC joint and humeral head.  Decreased range of motion with overhead flexion and extension secondary to pain.  Strength 5/5 bilateral upper and lower extremities.  Right knee: Tenderness palpation over medial joint.  No effusion or deformity noted.  Normal active range of motion.  Psychiatric:        Behavior: Behavior is cooperative.     UC Treatments / Results  Labs (all labs ordered are listed, but only abnormal results are displayed) Labs  Reviewed - No data to display  EKG   Radiology No  results found.  Procedures Procedures (including critical care time)  Medications Ordered in UC Medications  ketorolac (TORADOL) 30 MG/ML injection 30 mg (has no administration in time range)    Initial Impression / Assessment and Plan / UC Course  I have reviewed the triage vital signs and the nursing notes.  Pertinent labs & imaging results that were available during my care of the patient were reviewed by me and considered in my medical decision making (see chart for details).      Concern for RA flare given widespread worsening pain.  Patient was given 30 mg of Toradol in clinic today.  She denies any additional NSAID use today other than a 200 mg ibuprofen earlier today.  She is not currently taking methotrexate.  She was started on prednisone taper with instruction to take additional NSAIDs with this medication due to risk of GI bleeding.  She can use Tylenol for breakthrough pain.  Encouraged her to use heat, range of motion exercises, stretch for additional symptom relief.  Discussed alarm symptoms that warrant emergent evaluation.  Discussed the importance of compliance with disease modifying medications and encouraged to follow-up with rheumatology.  Strict return precautions given to which patient expressed understanding.  Final Clinical Impressions(s) / UC Diagnoses   Final diagnoses:  Acute pain of left shoulder  Left arm pain  Acute pain of right knee  Right foot pain  Rheumatoid arthritis flare (HCC)     Discharge Instructions      We gave you Toradol today so you should not take additional NSAIDs including aspirin, ibuprofen/Advil, naproxen/Aleve.  Please start prednisone taper tomorrow.  While you are taking this medication you should not take additional NSAIDs.  Use heat, range of motion exercises, stretch for additional symptom relief.  Follow-up with your rheumatologist.     ED Prescriptions     Medication Sig Dispense Auth. Provider   predniSONE  (STERAPRED UNI-PAK 21 TAB) 10 MG (21) TBPK tablet As directed 21 tablet Saban Heinlen K, PA-C      PDMP not reviewed this encounter.   Terrilee Croak, PA-C 05/26/21 1911

## 2021-05-27 ENCOUNTER — Other Ambulatory Visit (HOSPITAL_COMMUNITY): Payer: Self-pay

## 2021-06-04 ENCOUNTER — Other Ambulatory Visit (HOSPITAL_COMMUNITY): Payer: Self-pay

## 2021-06-12 DIAGNOSIS — M25571 Pain in right ankle and joints of right foot: Secondary | ICD-10-CM | POA: Diagnosis not present

## 2021-06-12 DIAGNOSIS — M79672 Pain in left foot: Secondary | ICD-10-CM | POA: Diagnosis not present

## 2021-06-12 DIAGNOSIS — M79604 Pain in right leg: Secondary | ICD-10-CM | POA: Diagnosis not present

## 2021-06-12 DIAGNOSIS — G894 Chronic pain syndrome: Secondary | ICD-10-CM | POA: Diagnosis not present

## 2021-06-12 DIAGNOSIS — M25561 Pain in right knee: Secondary | ICD-10-CM | POA: Diagnosis not present

## 2021-06-12 DIAGNOSIS — M1712 Unilateral primary osteoarthritis, left knee: Secondary | ICD-10-CM | POA: Diagnosis not present

## 2021-06-12 DIAGNOSIS — M79605 Pain in left leg: Secondary | ICD-10-CM | POA: Diagnosis not present

## 2021-06-12 DIAGNOSIS — M25512 Pain in left shoulder: Secondary | ICD-10-CM | POA: Diagnosis not present

## 2021-06-12 DIAGNOSIS — M79671 Pain in right foot: Secondary | ICD-10-CM | POA: Diagnosis not present

## 2021-06-12 DIAGNOSIS — M25521 Pain in right elbow: Secondary | ICD-10-CM | POA: Diagnosis not present

## 2021-06-12 DIAGNOSIS — Z79891 Long term (current) use of opiate analgesic: Secondary | ICD-10-CM | POA: Diagnosis not present

## 2021-06-15 ENCOUNTER — Ambulatory Visit: Payer: Medicare HMO | Admitting: Nurse Practitioner

## 2021-06-16 DIAGNOSIS — H524 Presbyopia: Secondary | ICD-10-CM | POA: Diagnosis not present

## 2021-06-16 DIAGNOSIS — H52209 Unspecified astigmatism, unspecified eye: Secondary | ICD-10-CM | POA: Diagnosis not present

## 2021-06-16 DIAGNOSIS — H5213 Myopia, bilateral: Secondary | ICD-10-CM | POA: Diagnosis not present

## 2021-06-16 LAB — HM DIABETES EYE EXAM

## 2021-06-17 ENCOUNTER — Telehealth: Payer: Self-pay | Admitting: Nurse Practitioner

## 2021-06-17 NOTE — Telephone Encounter (Signed)
Tried calling patient to schedule Medicare Annual Wellness Visit (AWV) either virtually or in office.    No answer   Last AWV ;01/16/20 please schedule at anytime with [facility]    This should be a 45 minute visit.

## 2021-06-18 ENCOUNTER — Encounter: Payer: Self-pay | Admitting: Nurse Practitioner

## 2021-06-21 ENCOUNTER — Encounter (HOSPITAL_COMMUNITY): Payer: Self-pay

## 2021-06-21 ENCOUNTER — Ambulatory Visit (HOSPITAL_COMMUNITY)
Admission: EM | Admit: 2021-06-21 | Discharge: 2021-06-21 | Disposition: A | Discharge status: Home / Self Care | Payer: Medicare HMO | Attending: Emergency Medicine | Admitting: Emergency Medicine

## 2021-06-21 DIAGNOSIS — L732 Hidradenitis suppurativa: Secondary | ICD-10-CM

## 2021-06-21 MED ORDER — DOXYCYCLINE HYCLATE 100 MG PO CAPS: 100.0000 mg | ORAL_CAPSULE | Freq: Two times a day (BID) | ORAL | 0 refills | Status: DC

## 2021-06-21 NOTE — ED Provider Notes (Signed)
[facility]    CSN: [id number] Arrival date & time: 06/21/21  1736      History   Chief Complaint Chief Complaint  Patient presents with   Abscess    HPI Shelby Robertson is a 50 y.o. female.   Patient reports a history of having abscesses under both axilla and groin.  States she has an episode 2-3 times per year.  States she currently has a painful abscess under her left arm.  Patient states she has never been referred to a dermatologist for this but has been told by her primary care that she has hidradenitis suppurativa.  States her primary care prescribes doxycycline for her.  Patient states primary care did incise and drain the lesion 1 time, however no local anesthesia was used, patient states this was very painful and she has no interest in repeating this procedure.  Patient is requesting prescription for doxycycline today.  Patient denies fever, aches, chills, nausea, vomiting, diarrhea, numbness or tingling in her left upper extremity.  The history is provided by the patient.   Past Medical History:  Diagnosis Date   Fibromyalgia    Fibromyalgia    Hypertension    Osteoarthritis    Rheumatoid arthritis(714.0)    Rheumatoid arthritis(714.0)     Patient Active Problem List   Diagnosis Date Noted   Controlled type 2 diabetes mellitus with complication, without long-term current use of insulin ([place]) 10/14/2020   Sepsis ([place]) 09/17/2019   AMS (altered mental status) 09/17/2019   Morbid obesity ([place]) 01/10/2019   Prediabetes 01/10/2019   Tobacco use disorder, continuous 01/10/2019   Soft tissue swelling of back 01/10/2019   Fibromyalgia 07/27/2017   Other fatigue 07/27/2017   History of hidradenitis suppurativa 07/27/2017   History of hypertension 07/27/2017   History of hyperlipidemia 07/27/2017   Smoker 07/27/2017   High risk medication use 08/21/2016   Rheumatoid arthritis of multiple sites without rheumatoid factor ([place]) 08/12/2016   ARTHRITIS,  GENERALIZED 07/14/2010   HYPERTENSION, BENIGN ESSENTIAL 03/04/2009   POLYURIA 03/04/2009   FIBROMYALGIA 11/09/2007   HIDRADENITIS SUPPURATIVA 07/11/2007   Mixed hyperlipidemia 06/28/2007   LEG EDEMA, BILATERAL 05/05/2007    History reviewed. No pertinent surgical history.  OB History   No obstetric history on file.      Home Medications    Prior to Admission medications   Medication Sig Start Date End Date Taking? Authorizing Provider  doxycycline (VIBRAMYCIN) 100 MG capsule Take 1 capsule (100 mg total) by mouth 2 (two) times daily. 06/21/21  Yes Lynden Oxford, PA-C  acetaminophen (TYLENOL) 650 MG CR tablet Take 1,300 mg by mouth every 8 (eight) hours as needed for pain.    [provider]  Adalimumab 40 MG/0.8ML PNKT INJECT 1 PEN INTO THE SKIN EVERY 14 DAYS 11/27/20 11/27/21  Ofilia Neas, PA-C  albuterol (PROVENTIL) (2.5 MG/3ML) 0.083% nebulizer solution Take 3 mLs (2.5 mg total) by nebulization every 6 (six) hours as needed for wheezing or shortness of breath. 10/11/19   Robyn Haber, MD  albuterol (VENTOLIN HFA) 108 (90 Base) MCG/ACT inhaler Inhale 2 puffs into the lungs every 4 (four) hours as needed for wheezing or shortness of breath (cough, shortness of breath or wheezing.). 10/11/19   Robyn Haber, MD  amLODipine (NORVASC) 10 MG tablet TAKE 1 TABLET BY MOUTH EVERY DAY 03/27/21   Minette Brine, FNP  aspirin 325 MG tablet Take 325 mg by mouth every 6 (six) hours as needed (pain).    [provider]  atorvastatin (LIPITOR) 20 MG tablet Take 1 tablet (20 mg total) by mouth daily. 04/27/21 04/27/22  Minette Brine, FNP  B-D TB SYRINGE 1CC/27GX1/2" 27G X 1/2" 1 ML MISC USE 1 SYRINGE TO INJECT METHOTREXATE INTO THE SKIN ONCE A WEEK. 07/23/19   Bo Merino, MD  blood glucose meter kit and supplies KIT Dispense based on patient and insurance preference. Use up to two times daily as directed. (FOR ICD-9 250.00, 250.01). 01/17/20   Minette Brine, FNP   chlorhexidine (HIBICLENS) 4 % external liquid Apply topically daily as needed. Dilute 10-15 mL in water, Use daily when bathing for 1-2 weeks Patient not taking: Reported on 02/12/2021 12/01/20   Melynda Ripple, MD  diphenhydrAMINE (BENADRYL) 25 MG tablet Take 25 mg by mouth every 6 (six) hours as needed for itching, allergies or sleep.    [provider]  DULoxetine (CYMBALTA) 60 MG capsule Take 60 mg by mouth daily.    [provider]  ezetimibe (ZETIA) 10 MG tablet TAKE 1 TABLET BY MOUTH EVERY DAY 03/27/21   Minette Brine, FNP  folic acid (FOLVITE) 1 MG tablet Take 2 tablets (2 mg total) by mouth daily. 02/07/20   Ofilia Neas, PA-C  hydrochlorothiazide (HYDRODIURIL) 12.5 MG tablet TAKE 1 TABLET (12.5 MG TOTAL) BY MOUTH DAILY AS NEEDED (FLUID/EDEMA). 03/27/21   Minette Brine, FNP  HYDROcodone-acetaminophen (NORCO) 10-325 MG tablet Take 1 tablet by mouth every 6 (six) hours. 08/20/19   [provider]  hydrOXYzine (ATARAX/VISTARIL) 25 MG tablet Take 1 tablet (25 mg total) by mouth every 8 (eight) hours as needed for itching. 05/15/20   Lamptey, Myrene Galas, MD  Lancets (ONETOUCH DELICA PLUS [id number]) MISC 2 (two) times daily. use for testing 01/17/20   [provider]  metFORMIN (GLUCOPHAGE) 500 MG tablet TAKE 1 TABLET BY MOUTH 2 TIMES DAILY WITH A MEAL. 03/27/21   Minette Brine, FNP  methotrexate 50 MG/2ML injection Inject 0.8 mls (20 mg total) into the skin weekly. 02/07/20   Ofilia Neas, PA-C  metoprolol tartrate (LOPRESSOR) 50 MG tablet TAKE 1 TABLET (50 MG TOTAL) BY MOUTH 2 (TWO) TIMES DAILY. PLEASE SCHEDULE APPOINTMENT FOR REFILLS. 05/25/21   Minette Brine, FNP  [facility] VERIO test strip TEST TWICE DAILY 02/11/20   Minette Brine, FNP  predniSONE (STERAPRED UNI-PAK 21 TAB) 10 MG (21) TBPK tablet As directed 05/26/21   Raspet, Erin K, PA-C  tiZANidine (ZANAFLEX) 4 MG capsule Take 4 mg by mouth every 6 (six) hours. 10/15/19   [provider]  triamcinolone  ointment (KENALOG) 0.1 % Apply topically. 09/30/20   [provider]    Family History Family History  Problem Relation Age of Onset   Diabetes Mother    Healthy Father    Heart attack Maternal Grandmother    Stroke Maternal Grandmother     Social History Social History   Tobacco Use   Smoking status: Every Day    Packs/day: 0.50    Years: 20.00    Pack years: 10.00    Types: Cigarettes   Smokeless tobacco: Never  Vaping Use   Vaping Use: Never used  Substance Use Topics   Alcohol use: Not Currently   Drug use: Not Currently    Types: Hydrocodone     Allergies   Patient has no known allergies.   Review of Systems Review of Systems  Skin:  Positive for wound.  All other systems reviewed and are negative.   Physical Exam Triage Vital Signs ED Triage Vitals  Enc Vitals Group     BP      Pulse      Resp      Temp      Temp src      SpO2      Weight      Height      Head Circumference      Peak Flow      Pain Score      Pain Loc      Pain Edu?      Excl. in [place]?    No data found.  Updated Vital Signs BP (!) 154/97 (BP Location: Left Arm)   Pulse 96   Temp 98.3 F (36.8 C) (Oral)   Resp 18   SpO2 96%   Visual Acuity Right Eye Distance:   Left Eye Distance:   Bilateral Distance:    Right Eye Near:   Left Eye Near:    Bilateral Near:     Physical Exam Constitutional:      Appearance: Normal appearance.  HENT:     Head: Normocephalic and atraumatic.  Cardiovascular:     Rate and Rhythm: Normal rate and regular rhythm.     Heart sounds: Normal heart sounds.  Pulmonary:     Effort: Pulmonary effort is normal.     Breath sounds: Normal breath sounds.  Musculoskeletal:        General: Normal range of motion.     Cervical back: Normal range of motion.  Skin:    General: Skin is warm and dry.     Findings: Lesion (2 x 3 cm abscess noted under left axilla without surrounding erythema, induration or warmth.  No drainage noted.)  present.  Neurological:     General: No focal deficit present.     Mental Status: She is alert and oriented to person, place, and time.  Psychiatric:        Mood and Affect: Mood normal.        Behavior: Behavior normal.     UC Treatments / Results  Labs (all labs ordered are listed, but only abnormal results are displayed) Labs Reviewed - No data to display  EKG   Radiology No results found.  Procedures Procedures (including critical care time)  Medications Ordered in UC Medications - No data to display  Initial Impression / Assessment and Plan / UC Course  I have reviewed the triage vital signs and the nursing notes.  Pertinent labs & imaging results that were available during my care of the patient were reviewed by me and considered in my medical decision making (see chart for details).     Patient politely declines I&D of lesion at this time.  I have prescribed doxycycline at patient's request.  Patient given strict return precautions to doxycycline be ineffective, she should evaluate area for erythema, drainage, increased swelling, induration, heat and pain.  Patient advised to follow-up with her primary and or urgent care if any of these occur.  Patient verbalized understanding. Final Clinical Impressions(s) / UC Diagnoses   Final diagnoses:  Hidradenitis suppurativa of left axilla  Hidradenitis suppurativa     Discharge Instructions      Begin doxycycline.  Reach out to your dermatologist as soon as possible to get in for further evaluation and preemptive treatment options.  Follow-up with either urgent care or dermatology should your symptoms not resolved.     ED Prescriptions     Medication Sig Dispense Auth. Provider   doxycycline (VIBRAMYCIN) 100  MG capsule Take 1 capsule (100 mg total) by mouth 2 (two) times daily. 20 capsule Lynden Oxford, PA-C      PDMP not reviewed this encounter.   Lynden Oxford, PA-C 06/22/21 2312

## 2021-06-21 NOTE — Discharge Instructions (Addendum)
Begin doxycycline.  Reach out to your dermatologist as soon as possible to get in for further evaluation and preemptive treatment options.  Follow-up with either urgent care or dermatology should your symptoms not resolved.

## 2021-06-21 NOTE — ED Triage Notes (Signed)
Pt present abscess underneath her left arm. Pt states the abscess has gotten bigger and is painful.

## 2021-06-23 ENCOUNTER — Other Ambulatory Visit (HOSPITAL_COMMUNITY): Payer: Self-pay

## 2021-06-25 ENCOUNTER — Other Ambulatory Visit (HOSPITAL_COMMUNITY): Payer: Self-pay

## 2021-06-25 MED FILL — Adalimumab Auto-injector Kit 40 MG/0.8ML: SUBCUTANEOUS | 28 days supply | Qty: 2 | Fill #2 | Status: AC

## 2021-07-02 NOTE — Progress Notes (Deleted)
Office Visit Note  Patient: Shelby Robertson             Date of Birth: [birth year]           MRN: [id number]             PCP: Minette Brine, FNP Referring: Minette Brine, FNP Visit Date: 07/16/2021 Occupation: '@GUAROCC'$ @  Subjective:    History of Present Illness: Shelby Robertson is a 50 y.o. female with history of seronegative rheumatoid arthritis, osteoarthritis, and fibromyalgia.  She is on Humira 40 mg sq injections every 14 days, Methotrexate 0.8 ml sq injections once weekly, and folic acid 2 mg daily.   CBC and CMP updated on 02/12/21.  She is overdue to update lab work.  Orders for CBC and CMP were released.  Her next lab work will be due in December and every 3 months. TB gold negative on 07/10/20.  Order for TB gold released today.   Activities of Daily Living:  Patient reports morning stiffness for *** {minute/hour:19697}.   Patient {ACTIONS;DENIES/REPORTS:21021675::"Denies"} nocturnal pain.  Difficulty dressing/grooming: {ACTIONS;DENIES/REPORTS:21021675::"Denies"} Difficulty climbing stairs: {ACTIONS;DENIES/REPORTS:21021675::"Denies"} Difficulty getting out of chair: {ACTIONS;DENIES/REPORTS:21021675::"Denies"} Difficulty using hands for taps, buttons, cutlery, and/or writing: {ACTIONS;DENIES/REPORTS:21021675::"Denies"}  No Rheumatology ROS completed.   PMFS History:  Patient Active Problem List   Diagnosis Date Noted   Controlled type 2 diabetes mellitus with complication, without long-term current use of insulin ([place]) 10/14/2020   Sepsis ([place]) 09/17/2019   AMS (altered mental status) 09/17/2019   Morbid obesity ([place]) 01/10/2019   Prediabetes 01/10/2019   Tobacco use disorder, continuous 01/10/2019   Soft tissue swelling of back 01/10/2019   Fibromyalgia 07/27/2017   Other fatigue 07/27/2017   History of hidradenitis suppurativa 07/27/2017   History of hypertension 07/27/2017   History of hyperlipidemia 07/27/2017   Smoker 07/27/2017   High risk medication use  08/21/2016   Rheumatoid arthritis of multiple sites without rheumatoid factor ([place]) 08/12/2016   ARTHRITIS, GENERALIZED 07/14/2010   HYPERTENSION, BENIGN ESSENTIAL 03/04/2009   POLYURIA 03/04/2009   FIBROMYALGIA 11/09/2007   HIDRADENITIS SUPPURATIVA 07/11/2007   Mixed hyperlipidemia 06/28/2007   LEG EDEMA, BILATERAL 05/05/2007    Past Medical History:  Diagnosis Date   Fibromyalgia    Fibromyalgia    Hypertension    Osteoarthritis    Rheumatoid arthritis(714.0)    Rheumatoid arthritis(714.0)     Family History  Problem Relation Age of Onset   Diabetes Mother    Healthy Father    Heart attack Maternal Grandmother    Stroke Maternal Grandmother    No past surgical history on file. Social History   Social History Narrative   Not on file   Immunization History  Administered Date(s) Administered   Influenza,inj,Quad PF,6+ Mos 09/18/2019, 10/14/2020   Influenza-Unspecified 07/18/2014   PFIZER(Purple Top)SARS-COV-2 Vaccination 02/11/2020, 03/03/2020   Td 11/11/2008     Objective: Vital Signs: There were no vitals taken for this visit.   Physical Exam Vitals and nursing note reviewed.  Constitutional:      Appearance: She is well-developed.  HENT:     Head: Normocephalic and atraumatic.  Eyes:     Conjunctiva/sclera: Conjunctivae normal.  Pulmonary:     Effort: Pulmonary effort is normal.  Abdominal:     Palpations: Abdomen is soft.  Musculoskeletal:     Cervical back: Normal range of motion.  Skin:    General: Skin is warm and dry.     Capillary Refill: Capillary refill takes less than 2 seconds.  Neurological:  Mental Status: She is alert and oriented to person, place, and time.  Psychiatric:        Behavior: Behavior normal.     Musculoskeletal Exam: ***  CDAI Exam: CDAI Score: -- Patient Global: --; Provider Global: -- Swollen: --; Tender: -- Joint Exam 07/16/2021   No joint exam has been documented for this visit   There is currently no  information documented on the homunculus. Go to the Rheumatology activity and complete the homunculus joint exam.  Investigation: No additional findings.  Imaging: No results found.  Recent Labs: Lab Results  Component Value Date   WBC 7.1 02/12/2021   HGB 14.4 02/12/2021   PLT 384 02/12/2021   NA 138 02/12/2021   K 4.2 02/12/2021   CL 104 02/12/2021   CO2 27 02/12/2021   GLUCOSE 117 (H) 02/12/2021   BUN 10 02/12/2021   CREATININE 0.56 02/12/2021   BILITOT 0.2 02/12/2021   ALKPHOS 94 10/14/2020   AST 9 (L) 02/12/2021   ALT 9 02/12/2021   PROT 7.8 02/12/2021   ALBUMIN 4.3 10/14/2020   CALCIUM 9.3 02/12/2021   GFRAA 128 02/12/2021   QFTBGOLDPLUS NEGATIVE 07/10/2020    Speciality Comments: No specialty comments available.  Procedures:  No procedures performed Allergies: Patient has no known allergies.   Assessment / Plan:     Visit Diagnoses: Rheumatoid arthritis of multiple sites without rheumatoid factor (HCC)  High risk medication use - Humira 40 mg subcu every other week, methotrexate 0.8 mL subcu weekly, folic acid 2 mg p.o. daily   Primary osteoarthritis of both knees  History of hidradenitis suppurativa  Fibromyalgia  Trapezius muscle spasm  Other fatigue  History of hypertension  History of hyperlipidemia  Smoker  Orders: No orders of the defined types were placed in this encounter.  No orders of the defined types were placed in this encounter.    Follow-Up Instructions: No follow-ups on file.   Ofilia Neas, PA-C  Note - This record has been created using Dragon software.  Chart creation errors have been sought, but may not always  have been located. Such creation errors do not reflect on  the standard of medical care.

## 2021-07-10 DIAGNOSIS — M25561 Pain in right knee: Secondary | ICD-10-CM | POA: Diagnosis not present

## 2021-07-10 DIAGNOSIS — M79605 Pain in left leg: Secondary | ICD-10-CM | POA: Diagnosis not present

## 2021-07-10 DIAGNOSIS — G894 Chronic pain syndrome: Secondary | ICD-10-CM | POA: Diagnosis not present

## 2021-07-10 DIAGNOSIS — Z79891 Long term (current) use of opiate analgesic: Secondary | ICD-10-CM | POA: Diagnosis not present

## 2021-07-10 DIAGNOSIS — M25521 Pain in right elbow: Secondary | ICD-10-CM | POA: Diagnosis not present

## 2021-07-10 DIAGNOSIS — M25512 Pain in left shoulder: Secondary | ICD-10-CM | POA: Diagnosis not present

## 2021-07-10 DIAGNOSIS — M79672 Pain in left foot: Secondary | ICD-10-CM | POA: Diagnosis not present

## 2021-07-10 DIAGNOSIS — M79604 Pain in right leg: Secondary | ICD-10-CM | POA: Diagnosis not present

## 2021-07-10 DIAGNOSIS — M79671 Pain in right foot: Secondary | ICD-10-CM | POA: Diagnosis not present

## 2021-07-10 DIAGNOSIS — M542 Cervicalgia: Secondary | ICD-10-CM | POA: Diagnosis not present

## 2021-07-10 DIAGNOSIS — M25571 Pain in right ankle and joints of right foot: Secondary | ICD-10-CM | POA: Diagnosis not present

## 2021-07-12 ENCOUNTER — Ambulatory Visit (INDEPENDENT_AMBULATORY_CARE_PROVIDER_SITE_OTHER): Payer: Medicare HMO | Admitting: Nurse Practitioner

## 2021-07-12 DIAGNOSIS — Z0000 Encounter for general adult medical examination without abnormal findings: Secondary | ICD-10-CM | POA: Diagnosis not present

## 2021-07-12 NOTE — Progress Notes (Signed)
Subjective:   Shelby Robertson is a 50 y.o. female who presents for Medicare Annual (Subsequent) preventive examination.  I connected with  Shelby Robertson on 07/12/21 by a telephone enabled telemedicine application and verified that I am speaking with the correct person using two identifiers.   I discussed the limitations of evaluation and management by telemedicine. The patient expressed understanding and agreed to proceed.    Review of Systems    Defer to PCP       Objective:    Today's Vitals   07/12/21 1059  PainSc: 0-No pain   There is no height or weight on file to calculate BMI.  Advanced Directives 07/12/2021 01/16/2020 09/17/2019 05/14/2019 02/23/2019 02/08/2019 01/10/2019  Does Patient Have a Medical Advance Directive? No No Yes No No No No  Type of Advance Directive - - [place]  Does patient want to make changes to medical advance directive? - - No - Patient declined - - - -  Copy of [place] in Chart? - - No - copy requested - - - -  Would patient like information on creating a medical advance directive? Yes (Inpatient - patient defers creating a medical advance directive at this time - Information given) Yes (MAU/Ambulatory/Procedural Areas - Information given) - No - Patient declined Yes (MAU/Ambulatory/Procedural Areas - Information given) No - Guardian declined Yes (MAU/Ambulatory/Procedural Areas - Information given)    Current Medications (verified) Outpatient Encounter Medications as of 07/12/2021  Medication Sig   acetaminophen (TYLENOL) 650 MG CR tablet Take 1,300 mg by mouth every 8 (eight) hours as needed for pain.   Adalimumab 40 MG/0.8ML PNKT INJECT 1 PEN INTO THE SKIN EVERY 14 DAYS   albuterol (PROVENTIL) (2.5 MG/3ML) 0.083% nebulizer solution Take 3 mLs (2.5 mg total) by nebulization every 6 (six) hours as needed for wheezing or shortness of breath.   albuterol (VENTOLIN HFA) 108 (90 Base) MCG/ACT inhaler  Inhale 2 puffs into the lungs every 4 (four) hours as needed for wheezing or shortness of breath (cough, shortness of breath or wheezing.).   amLODipine (NORVASC) 10 MG tablet TAKE 1 TABLET BY MOUTH EVERY DAY   aspirin 325 MG tablet Take 325 mg by mouth every 6 (six) hours as needed (pain).   atorvastatin (LIPITOR) 20 MG tablet Take 1 tablet (20 mg total) by mouth daily.   B-D TB SYRINGE 1CC/27GX1/2" 27G X 1/2" 1 ML MISC USE 1 SYRINGE TO INJECT METHOTREXATE INTO THE SKIN ONCE A WEEK.   blood glucose meter kit and supplies KIT Dispense based on patient and insurance preference. Use up to two times daily as directed. (FOR ICD-9 250.00, 250.01).   chlorhexidine (HIBICLENS) 4 % external liquid Apply topically daily as needed. Dilute 10-15 mL in water, Use daily when bathing for 1-2 weeks (Patient not taking: Reported on 02/12/2021)   diphenhydrAMINE (BENADRYL) 25 MG tablet Take 25 mg by mouth every 6 (six) hours as needed for itching, allergies or sleep.   doxycycline (VIBRAMYCIN) 100 MG capsule Take 1 capsule (100 mg total) by mouth 2 (two) times daily.   DULoxetine (CYMBALTA) 60 MG capsule Take 60 mg by mouth daily.   ezetimibe (ZETIA) 10 MG tablet TAKE 1 TABLET BY MOUTH EVERY DAY   folic acid (FOLVITE) 1 MG tablet Take 2 tablets (2 mg total) by mouth daily.   hydrochlorothiazide (HYDRODIURIL) 12.5 MG tablet TAKE 1 TABLET (12.5 MG TOTAL) BY MOUTH DAILY AS NEEDED (FLUID/EDEMA).   HYDROcodone-acetaminophen (  NORCO) 10-325 MG tablet Take 1 tablet by mouth every 6 (six) hours.   hydrOXYzine (ATARAX/VISTARIL) 25 MG tablet Take 1 tablet (25 mg total) by mouth every 8 (eight) hours as needed for itching.   Lancets (ONETOUCH DELICA PLUS [id number]) MISC 2 (two) times daily. use for testing   metFORMIN (GLUCOPHAGE) 500 MG tablet TAKE 1 TABLET BY MOUTH 2 TIMES DAILY WITH A MEAL.   methotrexate 50 MG/2ML injection Inject 0.8 mls (20 mg total) into the skin weekly.   metoprolol tartrate (LOPRESSOR) 50 MG tablet  TAKE 1 TABLET (50 MG TOTAL) BY MOUTH 2 (TWO) TIMES DAILY. PLEASE SCHEDULE APPOINTMENT FOR REFILLS.   ONETOUCH VERIO test strip TEST TWICE DAILY   predniSONE (STERAPRED UNI-PAK 21 TAB) 10 MG (21) TBPK tablet As directed   tiZANidine (ZANAFLEX) 4 MG capsule Take 4 mg by mouth every 6 (six) hours.   triamcinolone ointment (KENALOG) 0.1 % Apply topically.   No facility-administered encounter medications on file as of 07/12/2021.    Allergies (verified) Patient has no known allergies.   History: Past Medical History:  Diagnosis Date   Fibromyalgia    Fibromyalgia    Hypertension    Osteoarthritis    Rheumatoid arthritis(714.0)    Rheumatoid arthritis(714.0)    History reviewed. No pertinent surgical history. Family History  Problem Relation Age of Onset   Diabetes Mother    Healthy Father    Heart attack Maternal Grandmother    Stroke Maternal Grandmother    Social History   Socioeconomic History   Marital status: Single    Spouse name: Not on file   Number of children: Not on file   Years of education: Not on file   Highest education level: Not on file  Occupational History   Occupation: disability  Tobacco Use   Smoking status: Every Day    Packs/day: 0.50    Years: 20.00    Pack years: 10.00    Types: Cigarettes   Smokeless tobacco: Never  Vaping Use   Vaping Use: Never used  Substance and Sexual Activity   Alcohol use: Not Currently   Drug use: Not Currently    Types: Hydrocodone   Sexual activity: Yes    Birth control/protection: Condom  Other Topics Concern   Not on file  Social History Narrative   Not on file   Social Determinants of Health   Financial Resource Strain: Low Risk    Difficulty of Paying Living Expenses: Not very hard  Food Insecurity: No Food Insecurity   Worried About Charity fundraiser in the Last Year: Never true   Ran Out of Food in the Last Year: Never true  Transportation Needs: No Transportation Needs   Lack of Transportation  (Medical): No   Lack of Transportation (Non-Medical): No  Physical Activity: Insufficiently Active   Days of Exercise per Week: 1 day   Minutes of Exercise per Session: 10 min  Stress: No Stress Concern Present   Feeling of Stress : Not at all  Social Connections: Not on file    Tobacco Counseling Ready to quit: No Counseling given: No   Clinical Intake:  Pre-visit preparation completed: Yes  Pain : 0-10 Pain Score: 0-No pain     Diabetes: Yes CBG done?: No Did pt. bring in CBG monitor from home?: No    Interpreter Needed?: No    Activities of Daily Living In your present state of health, do you have any difficulty performing the following activities: 07/12/2021  Hearing?  N  Vision? N  Difficulty concentrating or making decisions? N  Walking or climbing stairs? N  Dressing or bathing? N  Doing errands, shopping? N  Some recent data might be hidden    Patient Care Team: Minette Brine, FNP as PCP - General (General Practice)  Indicate any recent Medical Services you may have received from other than [facility] providers in the past year (date may be approximate).     Assessment:   This is a routine wellness examination for Rebbecca.  Hearing/Vision screen No results found.  Dietary issues and exercise activities discussed:     Goals Addressed   None   Depression Screen PHQ 2/9 Scores 07/12/2021 02/12/2021 01/16/2020 09/27/2019 04/12/2019 02/23/2019 01/10/2019  PHQ - 2 Score 0 0 1 0 0 0 0  PHQ- 9 Score - - - - - - 0    Fall Risk Fall Risk  07/12/2021 01/16/2020 09/27/2019 04/12/2019 01/10/2019  Falls in the past year? 0 1 0 1 1  Comment - slides down the stairs - - -  Number falls in past yr: 0 1 - 0 0  Comment - - - - went to stand up and leg gave out  Injury with Fall? 0 0 - 0 0  Risk for fall due to : No Fall Risks History of fall(s);Medication side effect - - History of fall(s)  Follow up Falls evaluation completed Falls evaluation completed;Education  provided;Falls prevention discussed - - Falls prevention discussed;Education provided    FALL RISK PREVENTION PERTAINING TO THE HOME:  Any stairs in or around the home? Yes  If so, are there any without handrails? No  Home free of loose throw rugs in walkways, pet beds, electrical cords, etc? Yes Adequate lighting in your home to reduce risk of falls?Yes    ASSISTIVE DEVICES UTILIZED TO PREVENT FALLS:  Life alert? No  Use of a cane, walker or w/c? Yes  Grab bars in the bathroom? No  Shower chair or bench in shower? No  Elevated toilet seat or a handicapped toilet? No   TIMED UP AND GO:  Was the test performed?  N/A .  Length of time to ambulate 10 feet: N/A sec.    Cognitive Function:     6CIT Screen 07/12/2021 01/16/2020 01/10/2019  What Year? 0 points 0 points 0 points  What month? 0 points 0 points 0 points  What time? 0 points 0 points 0 points  Count back from 20 0 points 0 points 0 points  Months in reverse 0 points 2 points 4 points  Repeat phrase 0 points 0 points 0 points  Total Score 0 2 4    Immunizations Immunization History  Administered Date(s) Administered   Influenza,inj,Quad PF,6+ Mos 09/18/2019, 10/14/2020   Influenza-Unspecified 07/18/2014   PFIZER(Purple Top)SARS-COV-2 Vaccination 02/11/2020, 03/03/2020   Td 11/11/2008    TDAP status: Due, Education has been provided regarding the importance of this vaccine. Advised may receive this vaccine at local pharmacy or Health Dept. Aware to provide a copy of the vaccination record if obtained from local pharmacy or Health Dept. Verbalized acceptance and understanding.  Flu Vaccine status: Due, Education has been provided regarding the importance of this vaccine. Advised may receive this vaccine at local pharmacy or Health Dept. Aware to provide a copy of the vaccination record if obtained from local pharmacy or Health Dept. Verbalized acceptance and understanding.  Pneumococcal vaccine status: Up to  date  Covid-19 vaccine status: Completed vaccines  Qualifies for Shingles Vaccine? No  Zostavax completed No   Shingrix Completed?: No.    Education has been provided regarding the importance of this vaccine. Patient has been advised to call insurance company to determine out of pocket expense if they have not yet received this vaccine. Advised may also receive vaccine at local pharmacy or Health Dept. Verbalized acceptance and understanding.  Screening Tests Health Maintenance  Topic Date Due   COLONOSCOPY (Pts 45-58yrs Insurance coverage will need to be confirmed)  Never done   TETANUS/TDAP  11/11/2018   PAP SMEAR-Modifier  01/06/2020   COVID-19 Vaccine (3 - Pfizer risk series) 03/31/2020   URINE MICROALBUMIN  01/15/2021   INFLUENZA VACCINE  05/18/2021   HEMOGLOBIN A1C  08/14/2021   FOOT EXAM  02/12/2022   OPHTHALMOLOGY EXAM  06/16/2022   Hepatitis C Screening  Completed   HIV Screening  Completed   HPV VACCINES  Aged Out    Health Maintenance  Health Maintenance Due  Topic Date Due   COLONOSCOPY (Pts 45-16yrs Insurance coverage will need to be confirmed)  Never done   TETANUS/TDAP  11/11/2018   PAP SMEAR-Modifier  01/06/2020   COVID-19 Vaccine (3 - Pfizer risk series) 03/31/2020   URINE MICROALBUMIN  01/15/2021   INFLUENZA VACCINE  05/18/2021    Colorectal cancer screening: Referral to GI placed Needs referral placed. Pt aware the office will call re: appt.  Mammogram status: Ordered Scheduled for 07/13/2021. Pt provided with contact info and advised to call to schedule appt.     Lung Cancer Screening: (Low Dose CT Chest recommended if Age 24-80 years, 30 pack-year currently smoking OR have quit w/in 15years.) does not qualify.   Lung Cancer Screening Referral: N/A  Additional Screening:  Hepatitis C Screening: does not qualify; Completed 04/16/2020  Vision Screening: Recommended annual ophthalmology exams for early detection of glaucoma and other disorders of  the eye. Is the patient up to date with their annual eye exam?  Yes  Who is the provider or what is the name of the office in which the patient attends annual eye exams? [facility] If pt is not established with a provider, would they like to be referred to a provider to establish care?  N/A .   Dental Screening: Recommended annual dental exams for proper oral hygiene  Community Resource Referral / Chronic Care Management: CRR required this visit?  No   CCM required this visit?  No      Plan:     I have personally reviewed and noted the following in the patient's chart:   Medical and social history Use of alcohol, tobacco or illicit drugs  Current medications and supplements including opioid prescriptions.  Functional ability and status Nutritional status Physical activity Advanced directives List of other physicians Hospitalizations, surgeries, and ER visits in previous 12 months Vitals Screenings to include cognitive, depression, and falls Referrals and appointments  In addition, I have reviewed and discussed with patient certain preventive protocols, quality metrics, and best practice recommendations. A written personalized care plan for preventive services as well as general preventive health recommendations were provided to patient.     Elmon Else, [place]   07/12/2021   Nurse Notes: .Non-Face to Face 60 minute visit  . Ms. Quentin Cornwall , Thank you for taking time to come for your Medicare Wellness Visit. I appreciate your ongoing commitment to your health goals. Please review the following plan we discussed and let me know if I can assist you in the future.   These are  the goals we discussed:  Goals       Patient Stated      01/16/2020, wants to lose as much weight as possible       Weight (lb) < 200 lb (90.7 kg) (pt-stated)        This is a list of the screening recommended for you and due dates:  Health Maintenance  Topic Date Due   Colon Cancer  Screening  Never done   Tetanus Vaccine  11/11/2018   Pap Smear  01/06/2020   COVID-19 Vaccine (3 - Pfizer risk series) 03/31/2020   Urine Protein Check  01/15/2021   Flu Shot  05/18/2021   Hemoglobin A1C  08/14/2021   Complete foot exam   02/12/2022   Eye exam for diabetics  06/16/2022   Hepatitis C Screening: USPSTF Recommendation to screen - Ages 18-79 yo.  Completed   HIV Screening  Completed   HPV Vaccine  Aged Out

## 2021-07-13 ENCOUNTER — Ambulatory Visit: Payer: Medicare HMO

## 2021-07-13 ENCOUNTER — Other Ambulatory Visit: Payer: Self-pay | Admitting: Nurse Practitioner

## 2021-07-13 MED ORDER — ALBUTEROL SULFATE (2.5 MG/3ML) 0.083% IN NEBU: 2.5000 mg | INHALATION_SOLUTION | Freq: Four times a day (QID) | RESPIRATORY_TRACT | 12 refills | Status: AC | PRN

## 2021-07-14 ENCOUNTER — Other Ambulatory Visit: Payer: Self-pay

## 2021-07-14 ENCOUNTER — Ambulatory Visit
Admission: RE | Admit: 2021-07-14 | Discharge: 2021-07-14 | Disposition: A | Discharge status: Home / Self Care | Payer: Medicare HMO | Source: Ambulatory Visit | Attending: Nurse Practitioner | Admitting: Nurse Practitioner

## 2021-07-14 DIAGNOSIS — Z1231 Encounter for screening mammogram for malignant neoplasm of breast: Secondary | ICD-10-CM

## 2021-07-15 ENCOUNTER — Encounter: Payer: Self-pay | Admitting: Nurse Practitioner

## 2021-07-15 ENCOUNTER — Ambulatory Visit (INDEPENDENT_AMBULATORY_CARE_PROVIDER_SITE_OTHER): Payer: Medicare HMO | Admitting: Nurse Practitioner

## 2021-07-15 VITALS — BP 132/80 | HR 105 | Temp 97.8°F | Ht 65.2 in | Wt 279.8 lb

## 2021-07-15 DIAGNOSIS — M549 Dorsalgia, unspecified: Secondary | ICD-10-CM | POA: Diagnosis not present

## 2021-07-15 DIAGNOSIS — E559 Vitamin D deficiency, unspecified: Secondary | ICD-10-CM

## 2021-07-15 DIAGNOSIS — I10 Essential (primary) hypertension: Secondary | ICD-10-CM

## 2021-07-15 DIAGNOSIS — E118 Type 2 diabetes mellitus with unspecified complications: Secondary | ICD-10-CM

## 2021-07-15 DIAGNOSIS — G8929 Other chronic pain: Secondary | ICD-10-CM

## 2021-07-15 DIAGNOSIS — E782 Mixed hyperlipidemia: Secondary | ICD-10-CM

## 2021-07-15 DIAGNOSIS — Z23 Encounter for immunization: Secondary | ICD-10-CM | POA: Diagnosis not present

## 2021-07-15 MED ORDER — TRIAMCINOLONE ACETONIDE 40 MG/ML IJ SUSP
60.0000 mg | Freq: Once | INTRAMUSCULAR | Status: AC
  Administered 2021-07-15: 60 mg via INTRAMUSCULAR

## 2021-07-15 NOTE — Patient Instructions (Signed)
Diabetes Mellitus and Nutrition, Adult When you have diabetes, or diabetes mellitus, it is very important to have healthy eating habits because your blood sugar (glucose) levels are greatly affected by what you eat and drink. Eating healthy foods in the right amounts, at about the same times every day, can help you:  Control your blood glucose.  Lower your risk of heart disease.  Improve your blood pressure.  Reach or maintain a healthy weight. What can affect my meal plan? Every person with diabetes is different, and each person has different needs for a meal plan. Your health care provider may recommend that you work with a dietitian to make a meal plan that is best for you. Your meal plan may vary depending on factors such as:  The calories you need.  The medicines you take.  Your weight.  Your blood glucose, blood pressure, and cholesterol levels.  Your activity level.  Other health conditions you have, such as heart or kidney disease. How do carbohydrates affect me? Carbohydrates, also called carbs, affect your blood glucose level more than any other type of food. Eating carbs naturally raises the amount of glucose in your blood. Carb counting is a method for keeping track of how many carbs you eat. Counting carbs is important to keep your blood glucose at a healthy level, especially if you use insulin or take certain oral diabetes medicines. It is important to know how many carbs you can safely have in each meal. This is different for every person. Your dietitian can help you calculate how many carbs you should have at each meal and for each snack. How does alcohol affect me? Alcohol can cause a sudden decrease in blood glucose (hypoglycemia), especially if you use insulin or take certain oral diabetes medicines. Hypoglycemia can be a life-threatening condition. Symptoms of hypoglycemia, such as sleepiness, dizziness, and confusion, are similar to symptoms of having too much  alcohol.  Do not drink alcohol if: ? Your health care provider tells you not to drink. ? You are pregnant, may be pregnant, or are planning to become pregnant.  If you drink alcohol: ? Do not drink on an empty stomach. ? Limit how much you use to:  0-1 drink a day for women.  0-2 drinks a day for men. ? Be aware of how much alcohol is in your drink. In the [place] one drink equals one 12 oz bottle of beer (355 mL), one 5 oz glass of wine (148 mL), or one 1 oz glass of hard liquor (44 mL). ? Keep yourself hydrated with water, diet soda, or unsweetened iced tea.  Keep in mind that regular soda, juice, and other mixers may contain a lot of sugar and must be counted as carbs. What are tips for following this plan? Reading food labels  Start by checking the serving size on the "Nutrition Facts" label of packaged foods and drinks. The amount of calories, carbs, fats, and other nutrients listed on the label is based on one serving of the item. Many items contain more than one serving per package.  Check the total grams (g) of carbs in one serving. You can calculate the number of servings of carbs in one serving by dividing the total carbs by 15. For example, if a food has 30 g of total carbs per serving, it would be equal to 2 servings of carbs.  Check the number of grams (g) of saturated fats and trans fats in one serving. Choose foods that have   a low amount or none of these fats.  Check the number of milligrams (mg) of salt (sodium) in one serving. Most people should limit total sodium intake to less than 2,300 mg per day.  Always check the nutrition information of foods labeled as "low-fat" or "nonfat." These foods may be higher in added sugar or refined carbs and should be avoided.  Talk to your dietitian to identify your daily goals for nutrients listed on the label. Shopping  Avoid buying canned, pre-made, or processed foods. These foods tend to be high in fat, sodium, and added  sugar.  Shop around the outside edge of the grocery store. This is where you will most often find fresh fruits and vegetables, bulk grains, fresh meats, and fresh dairy. Cooking  Use low-heat cooking methods, such as baking, instead of high-heat cooking methods like deep frying.  Cook using healthy oils, such as olive, canola, or sunflower oil.  Avoid cooking with butter, cream, or high-fat meats. Meal planning  Eat meals and snacks regularly, preferably at the same times every day. Avoid going long periods of time without eating.  Eat foods that are high in fiber, such as fresh fruits, vegetables, beans, and whole grains. Talk with your dietitian about how many servings of carbs you can eat at each meal.  Eat 4-6 oz (112-168 g) of lean protein each day, such as lean meat, chicken, fish, eggs, or tofu. One ounce (oz) of lean protein is equal to: ? 1 oz (28 g) of meat, chicken, or fish. ? 1 egg. ?  cup (62 g) of tofu.  Eat some foods each day that contain healthy fats, such as avocado, nuts, seeds, and fish.   What foods should I eat? Fruits Berries. Apples. Oranges. Peaches. Apricots. Plums. Grapes. Mango. Papaya. Pomegranate. Kiwi. Cherries. Vegetables Lettuce. Spinach. Leafy greens, including kale, chard, collard greens, and mustard greens. Beets. Cauliflower. Cabbage. Broccoli. Carrots. Green beans. Tomatoes. Peppers. Onions. Cucumbers. Brussels sprouts. Grains Whole grains, such as whole-wheat or whole-grain bread, crackers, tortillas, cereal, and pasta. Unsweetened oatmeal. Quinoa. Brown or wild rice. Meats and other proteins Seafood. Poultry without skin. Lean cuts of poultry and beef. Tofu. Nuts. Seeds. Dairy Low-fat or fat-free dairy products such as milk, yogurt, and cheese. The items listed above may not be a complete list of foods and beverages you can eat. Contact a dietitian for more information. What foods should I avoid? Fruits Fruits canned with  syrup. Vegetables Canned vegetables. Frozen vegetables with butter or cream sauce. Grains Refined white flour and flour products such as bread, pasta, snack foods, and cereals. Avoid all processed foods. Meats and other proteins Fatty cuts of meat. Poultry with skin. Breaded or fried meats. Processed meat. Avoid saturated fats. Dairy Full-fat yogurt, cheese, or milk. Beverages Sweetened drinks, such as soda or iced tea. The items listed above may not be a complete list of foods and beverages you should avoid. Contact a dietitian for more information. Questions to ask a health care provider  Do I need to meet with a diabetes educator?  Do I need to meet with a dietitian?  What number can I call if I have questions?  When are the best times to check my blood glucose? Where to find more information:  American Diabetes Association: diabetes.org  Academy of Nutrition and Dietetics: www.eatright.org  National Institute of Diabetes and Digestive and Kidney Diseases: www.niddk.nih.gov  Association of Diabetes Care and Education Specialists: www.diabeteseducator.org Summary  It is important to have healthy eating   habits because your blood sugar (glucose) levels are greatly affected by what you eat and drink.  A healthy meal plan will help you control your blood glucose and maintain a healthy lifestyle.  Your health care provider may recommend that you work with a dietitian to make a meal plan that is best for you.  Keep in mind that carbohydrates (carbs) and alcohol have immediate effects on your blood glucose levels. It is important to count carbs and to use alcohol carefully. This information is not intended to replace advice given to you by your health care provider. Make sure you discuss any questions you have with your health care provider. Document Revised: 09/11/2019 Document Reviewed: 09/11/2019 Elsevier Patient Education  2021 Elsevier Inc.  

## 2021-07-15 NOTE — Progress Notes (Signed)
I,Jameka J Llittleton,acting as a Education administrator for [employer], NP.,have documented all relevant documentation on the behalf of [employer], NP,as directed by  Bary Castilla, NP while in the presence of Bary Castilla, NP.  This visit occurred during the SARS-CoV-2 public health emergency.  Safety protocols were in place, including screening questions prior to the visit, additional usage of staff PPE, and extensive cleaning of exam room while observing appropriate contact time as indicated for disinfecting solutions.  Subjective:     Patient ID: Shelby Robertson , female    DOB: 08-21-1971 , 50 y.o.   MRN: [id number]   Chief Complaint  Patient presents with   Diabetes   HPI  She is taking metformin. She is taking all her meds as needed. She also has some complains of her back hurting. It has been going on for a while now. She has arthritis and states that this is something that she has been dealing with years.     Past Medical History:  Diagnosis Date   Fibromyalgia    Fibromyalgia    Hypertension    Osteoarthritis    Rheumatoid arthritis(714.0)    Rheumatoid arthritis(714.0)      Family History  Problem Relation Age of Onset   Diabetes Mother    Healthy Father    Heart attack Maternal Grandmother    Stroke Maternal Grandmother      Current Outpatient Medications:    acetaminophen (TYLENOL) 650 MG CR tablet, Take 1,300 mg by mouth every 8 (eight) hours as needed for pain., Disp: , Rfl:    Adalimumab 40 MG/0.8ML PNKT, INJECT 1 PEN INTO THE SKIN EVERY 14 DAYS, Disp: 2 each, Rfl: 2   albuterol (PROVENTIL) (2.5 MG/3ML) 0.083% nebulizer solution, Take 3 mLs (2.5 mg total) by nebulization every 6 (six) hours as needed for wheezing or shortness of breath., Disp: 75 mL, Rfl: 12   amLODipine (NORVASC) 10 MG tablet, TAKE 1 TABLET BY MOUTH EVERY DAY, Disp: 90 tablet, Rfl: 1   aspirin 325 MG tablet, Take 325 mg by mouth every 6 (six) hours as needed (pain)., Disp: , Rfl:     atorvastatin (LIPITOR) 20 MG tablet, Take 1 tablet (20 mg total) by mouth daily., Disp: 30 tablet, Rfl: 2   B-D TB SYRINGE 1CC/27GX1/2" 27G X 1/2" 1 ML MISC, USE 1 SYRINGE TO INJECT METHOTREXATE INTO THE SKIN ONCE A WEEK., Disp: 12 each, Rfl: 3   blood glucose meter kit and supplies KIT, Dispense based on patient and insurance preference. Use up to two times daily as directed. (FOR ICD-9 250.00, 250.01)., Disp: 1 each, Rfl: 0   diphenhydrAMINE (BENADRYL) 25 MG tablet, Take 25 mg by mouth every 6 (six) hours as needed for itching, allergies or sleep., Disp: , Rfl:    DULoxetine (CYMBALTA) 60 MG capsule, Take 60 mg by mouth daily., Disp: , Rfl:    ezetimibe (ZETIA) 10 MG tablet, TAKE 1 TABLET BY MOUTH EVERY DAY, Disp: 90 tablet, Rfl: 1   folic acid (FOLVITE) 1 MG tablet, Take 2 tablets (2 mg total) by mouth daily., Disp: 180 tablet, Rfl: 3   hydrochlorothiazide (HYDRODIURIL) 12.5 MG tablet, TAKE 1 TABLET (12.5 MG TOTAL) BY MOUTH DAILY AS NEEDED (FLUID/EDEMA)., Disp: 90 tablet, Rfl: 1   HYDROcodone-acetaminophen (NORCO) 10-325 MG tablet, Take 1 tablet by mouth every 6 (six) hours., Disp: , Rfl:    hydrOXYzine (ATARAX/VISTARIL) 25 MG tablet, Take 1 tablet (25 mg total) by mouth every 8 (eight) hours as needed for itching., Disp:  30 tablet, Rfl: 0   Lancets (ONETOUCH DELICA PLUS [id number]) MISC, 2 (two) times daily. use for testing, Disp: , Rfl:    metFORMIN (GLUCOPHAGE) 500 MG tablet, TAKE 1 TABLET BY MOUTH 2 TIMES DAILY WITH A MEAL., Disp: 180 tablet, Rfl: 1   methotrexate 50 MG/2ML injection, Inject 0.8 mls (20 mg total) into the skin weekly., Disp: 2 mL, Rfl:    metoprolol tartrate (LOPRESSOR) 50 MG tablet, TAKE 1 TABLET (50 MG TOTAL) BY MOUTH 2 (TWO) TIMES DAILY. PLEASE SCHEDULE APPOINTMENT FOR REFILLS., Disp: 180 tablet, Rfl: 1   ONETOUCH VERIO test strip, TEST TWICE DAILY, Disp: 100 strip, Rfl: 3   tiZANidine (ZANAFLEX) 4 MG capsule, Take 4 mg by mouth every 6 (six) hours., Disp: , Rfl:     triamcinolone ointment (KENALOG) 0.1 %, Apply topically., Disp: , Rfl:    albuterol (VENTOLIN HFA) 108 (90 Base) MCG/ACT inhaler, Inhale 2 puffs into the lungs every 4 (four) hours as needed for wheezing or shortness of breath (cough, shortness of breath or wheezing.). (Patient not taking: Reported on 07/15/2021), Disp: 6.7 g, Rfl: 4   chlorhexidine (HIBICLENS) 4 % external liquid, Apply topically daily as needed. Dilute 10-15 mL in water, Use daily when bathing for 1-2 weeks (Patient not taking: No sig reported), Disp: 120 mL, Rfl: 0  Current Facility-Administered Medications:    triamcinolone acetonide (KENALOG-40) injection 60 mg, 60 mg, Intramuscular, Once, Shakaya Bhullar, NP   No Known Allergies   Review of Systems  Constitutional:  Negative for chills, fever and unexpected weight change.  HENT:  Negative for congestion and rhinorrhea.   Respiratory:  Negative for shortness of breath.   Cardiovascular:  Negative for chest pain and palpitations.  Endocrine: Negative for polydipsia, polyphagia and polyuria.  Musculoskeletal:  Positive for back pain. Negative for neck pain and neck stiffness.  Neurological:  Negative for dizziness, weakness and headaches.    Today's Vitals   07/15/21 1617  BP: 132/80  Pulse: (!) 105  Temp: 97.8 F (36.6 C)  Weight: 279 lb 12.8 oz (126.9 kg)  Height: 5' 5.2" (1.656 m)  PainSc: 0-No pain   Body mass index is 46.28 kg/m.   Objective:  Physical Exam Constitutional:      Appearance: Normal appearance. She is obese.  HENT:     Head: Normocephalic and atraumatic.  Cardiovascular:     Rate and Rhythm: Normal rate and regular rhythm.     Pulses: Normal pulses.     Heart sounds: Normal heart sounds. No murmur heard. Pulmonary:     Effort: Pulmonary effort is normal. No respiratory distress.     Breath sounds: No wheezing.  Musculoskeletal:        General: Tenderness present.     Cervical back: Tenderness present. Pain with movement present.      Thoracic back: Decreased range of motion.  Skin:    General: Skin is warm and dry.     Capillary Refill: Capillary refill takes less than 2 seconds.  Neurological:     Mental Status: She is alert.        Assessment And Plan:     1. Controlled type 2 diabetes mellitus with complication, without long-term current use of insulin (HCC) -Chronic, continue meds  --Discussed with patient the importance of glycemic control and long term complications from uncontrolled diabetes. Discussed with the patient the importance of compliance with home glucose monitoring, diet which includes decrease amount of sugary drinks and foods. Importance of exercise was also  discussed with the patient. Importance of eye exams, self foot care and compliance to office visits was also discussed with the patient.  - CMP14+EGFR - Hemoglobin A1c  2. Vitamin D deficiency -Will check her vitamin D today and assess.  - Vitamin D (25 hydroxy)  3. Immunization due -Flu vaccine given   4. Other chronic back pain - triamcinolone acetonide (KENALOG-40) injection 60 mg  5. Essential hypertension -Chronic, continue meds  -Limit the intake of processed foods and salt intake. You should increase your intake of green vegetables and fruits. Limit the use of alcohol. Limit fast foods and fried foods. Avoid high fatty saturated and trans fat foods. Keep yourself hydrated with drinking water. Avoid red meats. Eat lean meats instead. Exercise for atleast 30-45 min for atleast 4-5 times a week.  - CMP14+EGFR - CBC no Diff  6. Mixed hyperlipidemia - Lipid panel --Educated patient about a diet that is low in fat and high fatty foods including dairy products. Increase in take of fish and fiber. Decrease intake of red meats and fast foods. Exercise for atleast 4-5 times a week or atleast 30-45 min. Drink a lot of water.    The patient was encouraged to call or send a message through [place] for any questions or 07/15/21 concerns.   Follow up: if symptoms persist or do not get better.   Side effects and appropriate use of all the medication(s) were discussed with the patient today. Patient advised to use the medication(s) as directed by their healthcare provider. The patient was encouraged to read, review, and understand all associated package inserts and contact our office with any questions or concerns. The patient accepts the risks of the treatment plan and had an opportunity to ask questions.   Staying healthy and adopting a healthy lifestyle for your overall health is important. You should eat 7 or more servings of fruits and vegetables per day. You should drink plenty of water to keep yourself hydrated and your kidneys healthy. This includes about 65-80+ fluid ounces of water. Limit your intake of animal fats especially for elevated cholesterol. Avoid highly processed food and limit your salt intake if you have hypertension. Avoid foods high in saturated/Trans fats. Along with a healthy diet it is also very important to maintain time for yourself to maintain a healthy mental health with low stress levels. You should get atleast 150 min of moderate intensity exercise weekly for a healthy heart. Along with eating right and exercising, aim for at least 7-9 hours of sleep daily.  Eat more whole grains which includes barley, wheat berries, oats, brown rice and whole wheat pasta. Use healthy plant oils which include olive, soy, corn, sunflower and peanut. Limit your caffeine and sugary drinks. Limit your intake of fast foods. Limit milk and dairy products to one or two daily servings.   Patient was given opportunity to ask questions. Patient verbalized understanding of the plan and was able to repeat key elements of the plan. All questions were answered to their satisfaction.  Raman Dow Blahnik, DNP   I, Raman Ewing Fandino have reviewed all documentation for this visit. The documentation on 22 for the exam, diagnosis, procedures, and orders  are all accurate and complete.   IF YOU HAVE BEEN REFERRED TO A SPECIALIST, IT MAY TAKE 1-2 WEEKS TO SCHEDULE/PROCESS THE REFERRAL. IF YOU HAVE NOT HEARD FROM US/SPECIALIST IN TWO WEEKS, PLEASE GIVE [place] A CALL AT [phone].   THE PATIENT IS ENCOURAGED TO PRACTICE SOCIAL DISTANCING DUE  TO THE COVID-19 PANDEMIC.

## 2021-07-16 ENCOUNTER — Ambulatory Visit: Payer: Medicare HMO | Admitting: Physician Assistant

## 2021-07-16 ENCOUNTER — Other Ambulatory Visit: Payer: Self-pay | Admitting: Nurse Practitioner

## 2021-07-16 DIAGNOSIS — R5383 Other fatigue: Secondary | ICD-10-CM

## 2021-07-16 DIAGNOSIS — M170 Bilateral primary osteoarthritis of knee: Secondary | ICD-10-CM

## 2021-07-16 DIAGNOSIS — M797 Fibromyalgia: Secondary | ICD-10-CM

## 2021-07-16 DIAGNOSIS — Z8679 Personal history of other diseases of the circulatory system: Secondary | ICD-10-CM

## 2021-07-16 DIAGNOSIS — F17200 Nicotine dependence, unspecified, uncomplicated: Secondary | ICD-10-CM

## 2021-07-16 DIAGNOSIS — M0609 Rheumatoid arthritis without rheumatoid factor, multiple sites: Secondary | ICD-10-CM

## 2021-07-16 DIAGNOSIS — Z872 Personal history of diseases of the skin and subcutaneous tissue: Secondary | ICD-10-CM

## 2021-07-16 DIAGNOSIS — Z8639 Personal history of other endocrine, nutritional and metabolic disease: Secondary | ICD-10-CM

## 2021-07-16 DIAGNOSIS — E559 Vitamin D deficiency, unspecified: Secondary | ICD-10-CM

## 2021-07-16 DIAGNOSIS — M62838 Other muscle spasm: Secondary | ICD-10-CM

## 2021-07-16 DIAGNOSIS — Z79899 Other long term (current) drug therapy: Secondary | ICD-10-CM

## 2021-07-16 LAB — CBC
Hematocrit: 40.8 % (ref 34.0–46.6)
Hemoglobin: 13.4 g/dL (ref 11.1–15.9)
MCH: 29.3 pg (ref 26.6–33.0)
MCHC: 32.8 g/dL (ref 31.5–35.7)
MCV: 89 fL (ref 79–97)
Platelets: 352 10*3/uL (ref 150–450)
RBC: 4.58 x10E6/uL (ref 3.77–5.28)
RDW: 14.5 % (ref 11.7–15.4)
WBC: 6.6 10*3/uL (ref 3.4–10.8)

## 2021-07-16 LAB — CMP14+EGFR
ALT: 8 IU/L (ref 0–32)
AST: 9 IU/L (ref 0–40)
Albumin/Globulin Ratio: 1.2 (ref 1.2–2.2)
Albumin: 4.1 g/dL (ref 3.8–4.8)
Alkaline Phosphatase: 95 IU/L (ref 44–121)
BUN/Creatinine Ratio: 16 (ref 9–23)
BUN: 10 mg/dL (ref 6–24)
Bilirubin Total: 0.3 mg/dL (ref 0.0–1.2)
CO2: 24 mmol/L (ref 20–29)
Calcium: 9.3 mg/dL (ref 8.7–10.2)
Chloride: 102 mmol/L (ref 96–106)
Creatinine, Ser: 0.63 mg/dL (ref 0.57–1.00)
Globulin, Total: 3.4 g/dL (ref 1.5–4.5)
Glucose: 117 mg/dL — ABNORMAL HIGH (ref 70–99)
Potassium: 4.3 mmol/L (ref 3.5–5.2)
Sodium: 140 mmol/L (ref 134–144)
Total Protein: 7.5 g/dL (ref 6.0–8.5)
eGFR: 109 mL/min/{1.73_m2} (ref >59)

## 2021-07-16 LAB — LIPID PANEL
Chol/HDL Ratio: 4.8 ratio — ABNORMAL HIGH (ref 0.0–4.4)
Cholesterol, Total: 183 mg/dL (ref 100–199)
HDL: 38 mg/dL — ABNORMAL LOW (ref >39)
LDL Chol Calc (NIH): 115 mg/dL — ABNORMAL HIGH (ref 0–99)
Triglycerides: 167 mg/dL — ABNORMAL HIGH (ref 0–149)
VLDL Cholesterol Cal: 30 mg/dL (ref 5–40)

## 2021-07-16 LAB — HEMOGLOBIN A1C
Est. average glucose Bld gHb Est-mCnc: 137 mg/dL
Hgb A1c MFr Bld: 6.4 % — ABNORMAL HIGH (ref 4.8–5.6)

## 2021-07-16 LAB — VITAMIN D 25 HYDROXY (VIT D DEFICIENCY, FRACTURES): Vit D, 25-Hydroxy: 5.1 ng/mL — ABNORMAL LOW (ref 30.0–100.0)

## 2021-07-16 MED ORDER — VITAMIN D (ERGOCALCIFEROL) 1.25 MG (50000 UNIT) PO CAPS: ORAL_CAPSULE | ORAL | 0 refills | Status: DC

## 2021-07-21 ENCOUNTER — Other Ambulatory Visit: Payer: Self-pay | Admitting: Nurse Practitioner

## 2021-07-21 DIAGNOSIS — E782 Mixed hyperlipidemia: Secondary | ICD-10-CM

## 2021-07-27 NOTE — Progress Notes (Signed)
Office Visit Note  Patient: Shelby Robertson             Date of Birth: 09/19/1971           MRN: [id number]             PCP: Minette Brine, FNP Referring: Minette Brine, FNP Visit Date: 07/28/2021 Occupation: @GUAROCC @  Subjective:  Medication monitoring   History of Present Illness: Puja Caffey is a 50 y.o. female with history of seronegative rheumatoid arthritis, osteoarthritis, and fibromyalgia.  She is on Humira 40 mg subcutaneous injections every 14 days, methotrexate 0.8 ml injections once weekly, folic acid 2 mg by mouth daily.  She denies missing any doses of these medications recently.  She states that she was evaluated at urgent care on 05/26/2021 for left shoulder pain.  She states she was having muscle spasms on the left side.  She takes tizanidine 4 mg every 6 hours as needed for muscle spasms.  She states that she thinks her fibromyalgia pain due to fibromyalgia during that time.  She was given a prednisone taper at urgent care which resolved her symptoms.  She has not had any other signs or symptoms of a rheumatoid arthritis flare recently.  She states that both knee joints popping back but denies any pain or swelling at this time. She denies any recent infections.   Activities of Daily Living:  Patient reports morning stiffness for 5-10 minutes.   Patient Denies nocturnal pain.  Difficulty dressing/grooming: Denies Difficulty climbing stairs: Denies Difficulty getting out of chair: Denies Difficulty using hands for taps, buttons, cutlery, and/or writing: Denies  Review of Systems  Constitutional:  Negative for fatigue.  HENT:  Negative for mouth sores, mouth dryness and nose dryness.   Eyes:  Negative for pain, itching and dryness.  Respiratory:  Negative for shortness of breath and difficulty breathing.   Cardiovascular:  Negative for chest pain and palpitations.  Gastrointestinal:  Negative for blood in stool, constipation and diarrhea.  Endocrine: Negative for  increased urination.  Genitourinary:  Negative for difficulty urinating.  Musculoskeletal:  Positive for morning stiffness. Negative for joint pain, joint pain, joint swelling, myalgias, muscle tenderness and myalgias.  Skin:  Negative for color change, rash and redness.  Allergic/Immunologic: Negative for susceptible to infections.  Neurological:  Negative for dizziness, numbness, headaches, memory loss and weakness.  Hematological:  Negative for bruising/bleeding tendency.  Psychiatric/Behavioral:  Negative for confusion.    PMFS History:  Patient Active Problem List   Diagnosis Date Noted   Controlled type 2 diabetes mellitus with complication, without long-term current use of insulin ([place]) 10/14/2020   Sepsis ([place]) 09/17/2019   AMS (altered mental status) 09/17/2019   Morbid obesity ([place]) 01/10/2019   Prediabetes 01/10/2019   Tobacco use disorder, continuous 01/10/2019   Soft tissue swelling of back 01/10/2019   Fibromyalgia 07/27/2017   Other fatigue 07/27/2017   History of hidradenitis suppurativa 07/27/2017   History of hypertension 07/27/2017   History of hyperlipidemia 07/27/2017   Smoker 07/27/2017   High risk medication use 08/21/2016   Rheumatoid arthritis of multiple sites without rheumatoid factor ([place]) 08/12/2016   ARTHRITIS, GENERALIZED 07/14/2010   HYPERTENSION, BENIGN ESSENTIAL 03/04/2009   POLYURIA 03/04/2009   FIBROMYALGIA 11/09/2007   HIDRADENITIS SUPPURATIVA 07/11/2007   Mixed hyperlipidemia 06/28/2007   LEG EDEMA, BILATERAL 05/05/2007    Past Medical History:  Diagnosis Date   Fibromyalgia    Fibromyalgia    Hypertension    Osteoarthritis    Rheumatoid  arthritis(714.0)    Rheumatoid arthritis(714.0)     Family History  Problem Relation Age of Onset   Diabetes Mother    Healthy Father    Heart attack Maternal Grandmother    Stroke Maternal Grandmother    History reviewed. No pertinent surgical history. Social History   Social History  Narrative   Not on file   Immunization History  Administered Date(s) Administered   Influenza,inj,Quad PF,6+ Mos 09/18/2019, 10/14/2020, 07/15/2021   Influenza-Unspecified 07/18/2014   PFIZER(Purple Top)SARS-COV-2 Vaccination 02/11/2020, 03/03/2020   Td 11/11/2008     Objective: Vital Signs: BP (!) 151/97 (BP Location: Left Arm, Patient Position: Sitting, Cuff Size: Normal)   Pulse 91   Ht 5\' 5"  (1.651 m)   Wt 273 lb 12.8 oz (124.2 kg)   BMI 45.56 kg/m    Physical Exam Vitals and nursing note reviewed.  Constitutional:      Appearance: She is well-developed.  HENT:     Head: Normocephalic and atraumatic.  Eyes:     Conjunctiva/sclera: Conjunctivae normal.  Pulmonary:     Effort: Pulmonary effort is normal.  Abdominal:     Palpations: Abdomen is soft.  Musculoskeletal:     Cervical back: Normal range of motion.  Skin:    General: Skin is warm and dry.     Capillary Refill: Capillary refill takes less than 2 seconds.  Neurological:     Mental Status: She is alert and oriented to person, place, and time.  Psychiatric:        Behavior: Behavior normal.     Musculoskeletal Exam: C-spine has slightly limited range of motion without rotation.  Trapezius muscle tension and tenderness bilaterally.  Shoulder joints, elbow joints, wrist joints have good range of motion with no discomfort.  Contracture involving deviation of MCP joints of the left hand.  No tenderness or synovitis over MCP or PIP joints.  Hip joints have good range of motion with no discomfort.  Knee joints have good range of motion with crepitus but no warmth or effusion noted.  Ankle joints have good range of motion with no tenderness or joint swelling.  CDAI Exam: CDAI Score: -- Patient Global: --; Provider Global: -- Swollen: --; Tender: -- Joint Exam 07/28/2021   No joint exam has been documented for this visit   There is currently no information documented on the homunculus. Go to the Rheumatology  activity and complete the homunculus joint exam.  Investigation: No additional findings.  Imaging: MM 3D SCREEN BREAST BILATERAL  Result Date: 07/17/2021 CLINICAL DATA:  Screening. EXAM: DIGITAL SCREENING BILATERAL MAMMOGRAM WITH TOMOSYNTHESIS AND CAD TECHNIQUE: Bilateral screening digital craniocaudal and mediolateral oblique mammograms were obtained. Bilateral screening digital breast tomosynthesis was performed. The images were evaluated with computer-aided detection. COMPARISON:  Previous exam(s). ACR Breast Density Category b: There are scattered areas of fibroglandular density. FINDINGS: There are no findings suspicious for malignancy. IMPRESSION: No mammographic evidence of malignancy. A result letter of this screening mammogram will be mailed directly to the patient. RECOMMENDATION: Screening mammogram in one year. (Code:SM-B-01Y) BI-RADS CATEGORY  1: Negative. Electronically Signed   By: Marin Olp M.D.   On: 07/17/2021 14:27    Recent Labs: Lab Results  Component Value Date   WBC 6.6 07/15/2021   HGB 13.4 07/15/2021   PLT 352 07/15/2021   NA 140 07/15/2021   K 4.3 07/15/2021   CL 102 07/15/2021   CO2 24 07/15/2021   GLUCOSE 117 (H) 07/15/2021   BUN 10 07/15/2021  CREATININE 0.63 07/15/2021   BILITOT 0.3 07/15/2021   ALKPHOS 95 07/15/2021   AST 9 07/15/2021   ALT 8 07/15/2021   PROT 7.5 07/15/2021   ALBUMIN 4.1 07/15/2021   CALCIUM 9.3 07/15/2021   GFRAA 128 02/12/2021   QFTBGOLDPLUS NEGATIVE 07/10/2020    Speciality Comments: No specialty comments available.  Procedures:  No procedures performed Allergies: Patient has no known allergies.   Assessment / Plan:     Visit Diagnoses: Rheumatoid arthritis of multiple sites without rheumatoid factor ([place]): She has no joint tenderness or synovitis on examination today.  She is currently on Humira 40 mg sq injections every 14 days, methotrexate 0.8 mL sq injections once weekly, folic acid 2 mg daily.  She reports not  missing any doses of these medications recently.  She has evaluated at urgent care on 05/26/2021 for left shoulder pain.  At that time she was experiencing muscle spasms and was unsure if it was due to rheumatoid arthritis or fibromyalgia flare.  She was given a prednisone taper which resolved her symptoms.  On examination today she had good range of motion in both shoulder joints with no discomfort or tenderness.  Overall her rheumatoid arthritis has been well controlled on the current treatment regimen.  No medication changes will be made at this time.  She was advised to notify us if she develops increased joint pain or joint swelling.  She will follow-up in the office in 5 months.  High risk medication use -Humira 40 mg subcutaneous injections every 14 days, methotrexate 0.8 mL sq injections once weekly, and folic acid 2 mg daily.  CBC and CMP were updated on 07/15/2021 and results were reviewed with the patient today in the office.  She will be due to update lab work in December and every 3 months to monitor for drug toxicity.  TB Gold negative on 07/10/2020.  Order for TB gold released today.  Plan: QuantiFERON-TB Gold Plus She has not had any recent infections.  Discussed the importance of holding Humira and methotrexate if she develops signs or symptoms of an infection and to resume once the infection is completely cleared.  She voiced understanding.  Screening for tuberculosis -Order for TB gold released today.  Plan: QuantiFERON-TB Gold Plus  Primary osteoarthritis of both knees: She has good range of motion in both knee joints with no discomfort.  Crepitus noted bilaterally.  No warmth or effusion was noted.  Discussed the importance of lower extremity muscle strengthening.  History of hidradenitis suppurativa: She had a recent HS flare and was evaluated at urgent care on 06/21/21.  She was treated with doxycycline. Discussed the importance of holding Humira and MTX if she is on antibiotics and until  the infection has completely cleared.   She has been trying to practice good skin care. Discussed the importance of stress management.  She was advised to remain on Humira as prescribed for management of HS.  Fibromyalgia: She experiences intermittent myalgias and muscle tenderness due to fibromyalgia.  She takes tizanidine 4 mg every 6 hours as needed for muscle spasms.  She has occasional flares usually related to stressful events or weather changes. Discussed the importance of regular exercise and good sleep hygiene.  Discussed the importance of stress management.   Trapezius muscle spasm: She has occasional trapezius muscle tension and tenderness bilaterally.  She has muscle spasms intermittently.  She takes tizanidine 4 mg every 6 hours as needed for relief.  Other fatigue: Chronic and secondary to insomnia.  Discussed the importance of regular exercise.   Other medical conditions are listed as follows:  History of hypertension  History of hyperlipidemia  Smoker   Orders: Orders Placed This Encounter  Procedures   QuantiFERON-TB Gold Plus   No orders of the defined types were placed in this encounter.    Follow-Up Instructions: Return in about 5 months (around 12/26/2021) for Rheumatoid arthritis, Osteoarthritis, Fibromyalgia.   Ofilia Neas, PA-C  Note - This record has been created using Dragon software.  Chart creation errors have been sought, but may not always  have been located. Such creation errors do not reflect on  the standard of medical care.

## 2021-07-28 ENCOUNTER — Other Ambulatory Visit: Payer: Self-pay

## 2021-07-28 ENCOUNTER — Ambulatory Visit (INDEPENDENT_AMBULATORY_CARE_PROVIDER_SITE_OTHER): Payer: Medicare HMO | Admitting: Physician Assistant

## 2021-07-28 ENCOUNTER — Encounter: Payer: Self-pay | Admitting: Physician Assistant

## 2021-07-28 VITALS — BP 151/97 | HR 91 | Ht 65.0 in | Wt 273.8 lb

## 2021-07-28 DIAGNOSIS — R5383 Other fatigue: Secondary | ICD-10-CM

## 2021-07-28 DIAGNOSIS — Z79899 Other long term (current) drug therapy: Secondary | ICD-10-CM

## 2021-07-28 DIAGNOSIS — Z8679 Personal history of other diseases of the circulatory system: Secondary | ICD-10-CM

## 2021-07-28 DIAGNOSIS — M0609 Rheumatoid arthritis without rheumatoid factor, multiple sites: Secondary | ICD-10-CM

## 2021-07-28 DIAGNOSIS — M170 Bilateral primary osteoarthritis of knee: Secondary | ICD-10-CM | POA: Diagnosis not present

## 2021-07-28 DIAGNOSIS — M797 Fibromyalgia: Secondary | ICD-10-CM

## 2021-07-28 DIAGNOSIS — F17200 Nicotine dependence, unspecified, uncomplicated: Secondary | ICD-10-CM

## 2021-07-28 DIAGNOSIS — Z8639 Personal history of other endocrine, nutritional and metabolic disease: Secondary | ICD-10-CM

## 2021-07-28 DIAGNOSIS — M62838 Other muscle spasm: Secondary | ICD-10-CM | POA: Diagnosis not present

## 2021-07-28 DIAGNOSIS — Z872 Personal history of diseases of the skin and subcutaneous tissue: Secondary | ICD-10-CM

## 2021-07-28 DIAGNOSIS — Z111 Encounter for screening for respiratory tuberculosis: Secondary | ICD-10-CM

## 2021-07-28 DIAGNOSIS — R69 Illness, unspecified: Secondary | ICD-10-CM | POA: Diagnosis not present

## 2021-07-28 MED ORDER — METHOTREXATE SODIUM CHEMO INJECTION 50 MG/2ML: INTRAMUSCULAR | 0 refills | Status: AC

## 2021-07-28 NOTE — Patient Instructions (Signed)
Standing Labs We placed an order today for your standing lab work.   Please have your standing labs drawn in December and every 3 months    If possible, please have your labs drawn 2 weeks prior to your appointment so that the provider can discuss your results at your appointment.  Please note that you may see your imaging and lab results in MyChart before we have reviewed them. We may be awaiting multiple results to interpret others before contacting you. Please allow our office up to 72 hours to thoroughly review all of the results before contacting the office for clarification of your results.  We have open lab daily: Monday through Thursday from 1:30-4:30 PM and Friday from 1:30-4:00 PM at the office of Dr. Shaili Deveshwar, [facility].   Please be advised, all patients with office appointments requiring lab work will take precedent over walk-in lab work.  If possible, please come for your lab work on Monday and Friday afternoons, as you may experience shorter wait times. The office is located at [address], [address], [place],  [address] No appointment is necessary.   Labs are drawn by [facility]. Please bring your co-pay at the time of your lab draw.  You may receive a bill from [facility] for your lab work.  If you wish to have your labs drawn at another location, please call the office 24 hours in advance to send orders.  If you have any questions regarding directions or hours of operation,  please call [phone].   As a reminder, please drink plenty of water prior to coming for your lab work. Thanks!  

## 2021-07-28 NOTE — Telephone Encounter (Signed)
Patient requested a refill of MTX today at appointment. Please review and send to the pharmacy.

## 2021-07-29 ENCOUNTER — Other Ambulatory Visit: Payer: Self-pay | Admitting: Physician Assistant

## 2021-07-29 ENCOUNTER — Other Ambulatory Visit (HOSPITAL_COMMUNITY): Payer: Self-pay

## 2021-07-29 MED ORDER — HUMIRA PEN 40 MG/0.8ML ~~LOC~~ PNKT
PEN_INJECTOR | SUBCUTANEOUS | 2 refills | Status: DC
  Filled 2021-07-29: qty 2, 28d supply, fill #0
  Filled 2021-09-01: qty 2, 28d supply, fill #1
  Filled 2021-10-13: qty 2, 28d supply, fill #2

## 2021-07-29 NOTE — Telephone Encounter (Signed)
Next Visit: 12/22/2021  Last Visit: 07/28/2021  Last Fill: 11/27/2020  DX: Rheumatoid arthritis of multiple sites without rheumatoid factor   Current Dose per office note 07/28/2021: Humira 40 mg subcutaneous injections every 14 days  Labs: 07/15/2021 Glucose 117,   TB Gold: 07/10/2020 Neg (Updated on 07/28/2021)  Okay to refill Humira?

## 2021-07-30 ENCOUNTER — Other Ambulatory Visit (HOSPITAL_COMMUNITY): Payer: Self-pay

## 2021-08-01 LAB — QUANTIFERON-TB GOLD PLUS
Mitogen-NIL: >10 IU/mL
NIL: 0.06 IU/mL
QuantiFERON-TB Gold Plus: NEGATIVE
TB1-NIL: 0.03 IU/mL
TB2-NIL: <0 IU/mL

## 2021-08-03 NOTE — Progress Notes (Signed)
TB gold negative

## 2021-08-06 ENCOUNTER — Other Ambulatory Visit (HOSPITAL_COMMUNITY): Payer: Self-pay

## 2021-08-07 DIAGNOSIS — M25512 Pain in left shoulder: Secondary | ICD-10-CM | POA: Diagnosis not present

## 2021-08-07 DIAGNOSIS — G894 Chronic pain syndrome: Secondary | ICD-10-CM | POA: Diagnosis not present

## 2021-08-07 DIAGNOSIS — M542 Cervicalgia: Secondary | ICD-10-CM | POA: Diagnosis not present

## 2021-08-07 DIAGNOSIS — M25561 Pain in right knee: Secondary | ICD-10-CM | POA: Diagnosis not present

## 2021-08-07 DIAGNOSIS — M25521 Pain in right elbow: Secondary | ICD-10-CM | POA: Diagnosis not present

## 2021-08-07 DIAGNOSIS — M79604 Pain in right leg: Secondary | ICD-10-CM | POA: Diagnosis not present

## 2021-08-07 DIAGNOSIS — M79605 Pain in left leg: Secondary | ICD-10-CM | POA: Diagnosis not present

## 2021-08-07 DIAGNOSIS — M25571 Pain in right ankle and joints of right foot: Secondary | ICD-10-CM | POA: Diagnosis not present

## 2021-08-07 DIAGNOSIS — Z79891 Long term (current) use of opiate analgesic: Secondary | ICD-10-CM | POA: Diagnosis not present

## 2021-08-07 DIAGNOSIS — M79672 Pain in left foot: Secondary | ICD-10-CM | POA: Diagnosis not present

## 2021-08-07 DIAGNOSIS — M79671 Pain in right foot: Secondary | ICD-10-CM | POA: Diagnosis not present

## 2021-08-12 ENCOUNTER — Ambulatory Visit: Payer: Medicare HMO | Admitting: Nurse Practitioner

## 2021-08-16 ENCOUNTER — Other Ambulatory Visit: Payer: Self-pay

## 2021-08-16 ENCOUNTER — Encounter (HOSPITAL_COMMUNITY): Payer: Self-pay | Admitting: Emergency Medicine

## 2021-08-16 ENCOUNTER — Ambulatory Visit (HOSPITAL_COMMUNITY)
Admission: EM | Admit: 2021-08-16 | Discharge: 2021-08-16 | Disposition: A | Discharge status: Home / Self Care | Payer: Medicare HMO | Attending: Emergency Medicine | Admitting: Emergency Medicine

## 2021-08-16 DIAGNOSIS — S39012A Strain of muscle, fascia and tendon of lower back, initial encounter: Secondary | ICD-10-CM

## 2021-08-16 DIAGNOSIS — M62830 Muscle spasm of back: Secondary | ICD-10-CM | POA: Diagnosis not present

## 2021-08-16 MED ORDER — PREDNISONE 10 MG (21) PO TBPK: ORAL_TABLET | Freq: Every day | ORAL | 0 refills | Status: DC

## 2021-08-16 NOTE — ED Provider Notes (Signed)
[facility]    CSN: [id number] Arrival date & time: 08/16/21  1528      History   Chief Complaint Chief Complaint  Patient presents with   Back Pain    HPI Shelby Robertson is a 50 y.o. female.   Patient here for evaluation of mid back pain that has been ongoing for the past 2 days.  Patient reports pain started while she was sitting in bed.  Reports pain is worse with movement and improves with rest.  Patient does have a history of rheumatoid arthritis as well as fibromyalgia.  Reports taking pain medications and muscle relaxers that she has been previously prescribed with some relief.  Patient requesting prednisone as that has helped with the pain in the past.  Denies any trauma, injury, or other precipitating event.  Denies any fevers, chest pain, shortness of breath, N/V/D, numbness, tingling, weakness, abdominal pain, or headaches.    The history is provided by the patient.  Back Pain  Past Medical History:  Diagnosis Date   Fibromyalgia    Fibromyalgia    Hypertension    Osteoarthritis    Rheumatoid arthritis(714.0)    Rheumatoid arthritis(714.0)     Patient Active Problem List   Diagnosis Date Noted   Controlled type 2 diabetes mellitus with complication, without long-term current use of insulin ([place]) 10/14/2020   Sepsis ([place]) 09/17/2019   AMS (altered mental status) 09/17/2019   Morbid obesity ([place]) 01/10/2019   Prediabetes 01/10/2019   Tobacco use disorder, continuous 01/10/2019   Soft tissue swelling of back 01/10/2019   Fibromyalgia 07/27/2017   Other fatigue 07/27/2017   History of hidradenitis suppurativa 07/27/2017   History of hypertension 07/27/2017   History of hyperlipidemia 07/27/2017   Smoker 07/27/2017   High risk medication use 08/21/2016   Rheumatoid arthritis of multiple sites without rheumatoid factor ([place]) 08/12/2016   ARTHRITIS, GENERALIZED 07/14/2010   HYPERTENSION, BENIGN ESSENTIAL 03/04/2009   POLYURIA 03/04/2009    FIBROMYALGIA 11/09/2007   HIDRADENITIS SUPPURATIVA 07/11/2007   Mixed hyperlipidemia 06/28/2007   LEG EDEMA, BILATERAL 05/05/2007    History reviewed. No pertinent surgical history.  OB History   No obstetric history on file.      Home Medications    Prior to Admission medications   Medication Sig Start Date End Date Taking? Authorizing Provider  predniSONE (STERAPRED UNI-PAK 21 TAB) 10 MG (21) TBPK tablet Take by mouth daily. Take 6 tabs by mouth daily  for 2 days, then 5 tabs for 2 days, then 4 tabs for 2 days, then 3 tabs for 2 days, 2 tabs for 2 days, then 1 tab by mouth daily for 2 days 08/16/21  Yes Pearson Forster, NP  acetaminophen (TYLENOL) 650 MG CR tablet Take 1,300 mg by mouth every 8 (eight) hours as needed for pain.    [provider]  Adalimumab (HUMIRA PEN) 40 MG/0.8ML PNKT INJECT 1 PEN INTO THE SKIN EVERY 14 DAYS 07/29/21 07/29/22  Bo Merino, MD  albuterol (PROVENTIL) (2.5 MG/3ML) 0.083% nebulizer solution Take 3 mLs (2.5 mg total) by nebulization every 6 (six) hours as needed for wheezing or shortness of breath. 07/13/21   Minette Brine, FNP  albuterol (VENTOLIN HFA) 108 (90 Base) MCG/ACT inhaler Inhale 2 puffs into the lungs every 4 (four) hours as needed for wheezing or shortness of breath (cough, shortness of breath or wheezing.). 10/11/19   Robyn Haber, MD  amLODipine (NORVASC) 10 MG tablet TAKE 1 TABLET BY MOUTH EVERY DAY 03/27/21  Minette Brine, FNP  aspirin 325 MG tablet Take 325 mg by mouth every 6 (six) hours as needed (pain).    [provider]  atorvastatin (LIPITOR) 20 MG tablet TAKE 1 TABLET BY MOUTH EVERY DAY 07/24/21   Minette Brine, FNP  B-D TB SYRINGE 1CC/27GX1/2" 27G X 1/2" 1 ML MISC USE 1 SYRINGE TO INJECT METHOTREXATE INTO THE SKIN ONCE A WEEK. 07/23/19   Bo Merino, MD  blood glucose meter kit and supplies KIT Dispense based on patient and insurance preference. Use up to two times daily as directed. (FOR ICD-9  250.00, 250.01). 01/17/20   Minette Brine, FNP  chlorhexidine (HIBICLENS) 4 % external liquid Apply topically daily as needed. Dilute 10-15 mL in water, Use daily when bathing for 1-2 weeks 12/01/20   Melynda Ripple, MD  diphenhydrAMINE (BENADRYL) 25 MG tablet Take 25 mg by mouth every 6 (six) hours as needed for itching, allergies or sleep.    [provider]  DULoxetine (CYMBALTA) 60 MG capsule Take 60 mg by mouth daily.    [provider]  ezetimibe (ZETIA) 10 MG tablet TAKE 1 TABLET BY MOUTH EVERY DAY 03/27/21   Minette Brine, FNP  folic acid (FOLVITE) 1 MG tablet Take 2 tablets (2 mg total) by mouth daily. 02/07/20   Ofilia Neas, PA-C  hydrochlorothiazide (HYDRODIURIL) 12.5 MG tablet TAKE 1 TABLET (12.5 MG TOTAL) BY MOUTH DAILY AS NEEDED (FLUID/EDEMA). 03/27/21   Minette Brine, FNP  HYDROcodone-acetaminophen (NORCO) 10-325 MG tablet Take 1 tablet by mouth every 6 (six) hours. 08/20/19   [provider]  hydrOXYzine (ATARAX/VISTARIL) 25 MG tablet Take 1 tablet (25 mg total) by mouth every 8 (eight) hours as needed for itching. 05/15/20   Lamptey, Myrene Galas, MD  Lancets (ONETOUCH DELICA PLUS [id number]) MISC 2 (two) times daily. use for testing 01/17/20   [provider]  metFORMIN (GLUCOPHAGE) 500 MG tablet TAKE 1 TABLET BY MOUTH 2 TIMES DAILY WITH A MEAL. 03/27/21   Minette Brine, FNP  methotrexate 50 MG/2ML injection Inject 0.8 mls (20 mg total) into the skin weekly. 07/28/21   Ofilia Neas, PA-C  metoprolol tartrate (LOPRESSOR) 50 MG tablet TAKE 1 TABLET (50 MG TOTAL) BY MOUTH 2 (TWO) TIMES DAILY. PLEASE SCHEDULE APPOINTMENT FOR REFILLS. 05/25/21   Minette Brine, FNP  [facility] VERIO test strip TEST TWICE DAILY 02/11/20   Minette Brine, FNP  tiZANidine (ZANAFLEX) 4 MG capsule Take 4 mg by mouth every 6 (six) hours. 10/15/19   [provider]  triamcinolone ointment (KENALOG) 0.1 % Apply topically. 09/30/20   [provider]  Vitamin D,  Ergocalciferol, (DRISDOL) 1.25 MG (50000 UNIT) CAPS capsule Take 1 capsule by mouth on Tues and one on Fridays. 07/16/21   Bary Castilla, NP    Family History Family History  Problem Relation Age of Onset   Diabetes Mother    Healthy Father    Heart attack Maternal Grandmother    Stroke Maternal Grandmother     Social History Social History   Tobacco Use   Smoking status: Every Day    Packs/day: 0.50    Years: 20.00    Pack years: 10.00    Types: Cigarettes   Smokeless tobacco: Never  Vaping Use   Vaping Use: Never used  Substance Use Topics   Alcohol use: Not Currently   Drug use: Not Currently    Types: Hydrocodone     Allergies   Patient has no known allergies.   Review of Systems Review  of Systems  Musculoskeletal:  Positive for back pain.  All other systems reviewed and are negative.   Physical Exam Triage Vital Signs ED Triage Vitals  Enc Vitals Group     BP 08/16/21 1651 131/86     Pulse Rate 08/16/21 1651 88     Resp 08/16/21 1651 17     Temp 08/16/21 1651 98.2 F (36.8 C)     Temp Source 08/16/21 1651 Oral     SpO2 08/16/21 1651 100 %     Weight --      Height --      Head Circumference --      Peak Flow --      Pain Score 08/16/21 1650 9     Pain Loc --      Pain Edu? --      Excl. in [place]? --    No data found.  Updated Vital Signs BP 131/86 (BP Location: Right Arm)   Pulse 88   Temp 98.2 F (36.8 C) (Oral)   Resp 17   SpO2 100%   Visual Acuity Right Eye Distance:   Left Eye Distance:   Bilateral Distance:    Right Eye Near:   Left Eye Near:    Bilateral Near:     Physical Exam Vitals and nursing note reviewed.  Constitutional:      General: She is not in acute distress.    Appearance: Normal appearance. She is not ill-appearing, toxic-appearing or diaphoretic.  HENT:     Head: Normocephalic and atraumatic.  Eyes:     Conjunctiva/sclera: Conjunctivae normal.  Cardiovascular:     Rate and Rhythm: Normal rate.      Pulses: Normal pulses.  Pulmonary:     Effort: Pulmonary effort is normal.  Abdominal:     General: Abdomen is flat.  Musculoskeletal:        General: Normal range of motion.     Cervical back: Normal range of motion.     Thoracic back: Spasms and tenderness (primarily on the left side) present. No bony tenderness. Normal range of motion.  Skin:    General: Skin is warm and dry.  Neurological:     General: No focal deficit present.     Mental Status: She is alert and oriented to person, place, and time.  Psychiatric:        Mood and Affect: Mood normal.     UC Treatments / Results  Labs (all labs ordered are listed, but only abnormal results are displayed) Labs Reviewed - No data to display  EKG   Radiology No results found.  Procedures Procedures (including critical care time)  Medications Ordered in UC Medications - No data to display  Initial Impression / Assessment and Plan / UC Course  I have reviewed the triage vital signs and the nursing notes.  Pertinent labs & imaging results that were available during my care of the patient were reviewed by me and considered in my medical decision making (see chart for details).    Assessment negative for red flags or concerns.  This is likely muscle spasms of the back or lumbar strain.  Patient requesting steroid taper so this was prescribed.  Patient was instructed not to take her medications more than prescribed.  Discussed conservative symptom management including heat and ice.  Follow-up with primary care for reevaluation. Final Clinical Impressions(s) / UC Diagnoses   Final diagnoses:  Strain of lumbar region, initial encounter  Muscle spasm of back  Discharge Instructions      Take the prednisone as prescribed.  Take this in the morning.  Take your medications as previously prescribed.  You can try heat, ice, or alternate between heat and ice for comfort.  You may use IcyHot, lidocaine patches, Biofreeze,  Voltaren gel, or Aspercreme as needed for pain relief.  Follow-up with primary care for reevaluation of soon as possible. Return or go to the Emergency Department if symptoms worsen or do not improve in the next few days.      ED Prescriptions     Medication Sig Dispense Auth. Provider   predniSONE (STERAPRED UNI-PAK 21 TAB) 10 MG (21) TBPK tablet Take by mouth daily. Take 6 tabs by mouth daily  for 2 days, then 5 tabs for 2 days, then 4 tabs for 2 days, then 3 tabs for 2 days, 2 tabs for 2 days, then 1 tab by mouth daily for 2 days 42 tablet Pearson Forster, NP      PDMP not reviewed this encounter.   Pearson Forster, NP 08/16/21 [phone]

## 2021-08-16 NOTE — Discharge Instructions (Signed)
Take the prednisone as prescribed.  Take this in the morning.  Take your medications as previously prescribed.  You can try heat, ice, or alternate between heat and ice for comfort.  You may use IcyHot, lidocaine patches, Biofreeze, Voltaren gel, or Aspercreme as needed for pain relief.  Follow-up with primary care for reevaluation of soon as possible. Return or go to the Emergency Department if symptoms worsen or do not improve in the next few days.

## 2021-08-16 NOTE — ED Triage Notes (Signed)
Pt reports left mid back pains for 2 days that is constant. Denies falls, injury or lifting.

## 2021-08-24 ENCOUNTER — Ambulatory Visit (HOSPITAL_COMMUNITY)
Admission: EM | Admit: 2021-08-24 | Discharge: 2021-08-24 | Disposition: A | Discharge status: Home / Self Care | Payer: Medicare HMO | Attending: Internal Medicine | Admitting: Internal Medicine

## 2021-08-24 ENCOUNTER — Encounter (HOSPITAL_COMMUNITY): Payer: Self-pay | Admitting: Emergency Medicine

## 2021-08-24 ENCOUNTER — Other Ambulatory Visit: Payer: Self-pay

## 2021-08-24 DIAGNOSIS — L0291 Cutaneous abscess, unspecified: Secondary | ICD-10-CM

## 2021-08-24 DIAGNOSIS — L02415 Cutaneous abscess of right lower limb: Secondary | ICD-10-CM | POA: Diagnosis not present

## 2021-08-24 HISTORY — DX: Type 2 diabetes mellitus without complications: E11.9

## 2021-08-24 MED ORDER — DOXYCYCLINE HYCLATE 100 MG PO CAPS: 100.0000 mg | ORAL_CAPSULE | Freq: Two times a day (BID) | ORAL | 0 refills | Status: AC

## 2021-08-24 NOTE — ED Triage Notes (Signed)
Reports abscess to right thigh.  Over 3 days, started as a bump, increased in size and opened and is draining now.  Patient concerned for possibly needing antibiotic.

## 2021-08-24 NOTE — Discharge Instructions (Addendum)
Please take medications as prescribed Continue warm compress If symptoms worsen ,please return to  urgent care for re-evaluation.

## 2021-08-24 NOTE — ED Provider Notes (Signed)
[facility]    CSN: [id number] Arrival date & time: 08/24/21  1617      History   Chief Complaint Chief Complaint  Patient presents with   Abscess    HPI Shelby Robertson is a 50 y.o. female comes to urgent care with draining abscess on the right thigh.  Patient noticed a painful abscess 3 days ago.  She has been using warm compresses at home.  The abscess started draining yesterday.  No fever or chills.  No significant erythema.  Patient has a history of recurrent abscesses.  HPI  Past Medical History:  Diagnosis Date   Diabetes mellitus without complication ([place])    Fibromyalgia    Fibromyalgia    Hypertension    Osteoarthritis    Rheumatoid arthritis(714.0)    Rheumatoid arthritis(714.0)     Patient Active Problem List   Diagnosis Date Noted   Controlled type 2 diabetes mellitus with complication, without long-term current use of insulin ([place]) 10/14/2020   Sepsis ([place]) 09/17/2019   AMS (altered mental status) 09/17/2019   Morbid obesity ([place]) 01/10/2019   Prediabetes 01/10/2019   Tobacco use disorder, continuous 01/10/2019   Soft tissue swelling of back 01/10/2019   Fibromyalgia 07/27/2017   Other fatigue 07/27/2017   History of hidradenitis suppurativa 07/27/2017   History of hypertension 07/27/2017   History of hyperlipidemia 07/27/2017   Smoker 07/27/2017   High risk medication use 08/21/2016   Rheumatoid arthritis of multiple sites without rheumatoid factor ([place]) 08/12/2016   ARTHRITIS, GENERALIZED 07/14/2010   HYPERTENSION, BENIGN ESSENTIAL 03/04/2009   POLYURIA 03/04/2009   FIBROMYALGIA 11/09/2007   HIDRADENITIS SUPPURATIVA 07/11/2007   Mixed hyperlipidemia 06/28/2007   LEG EDEMA, BILATERAL 05/05/2007    History reviewed. No pertinent surgical history.  OB History   No obstetric history on file.      Home Medications    Prior to Admission medications   Medication Sig Start Date End Date Taking? Authorizing Provider  doxycycline  (VIBRAMYCIN) 100 MG capsule Take 1 capsule (100 mg total) by mouth 2 (two) times daily for 7 days. 08/24/21 08/31/21 Yes , Myrene Galas, MD  acetaminophen (TYLENOL) 650 MG CR tablet Take 1,300 mg by mouth every 8 (eight) hours as needed for pain.    [provider]  Adalimumab (HUMIRA PEN) 40 MG/0.8ML PNKT INJECT 1 PEN INTO THE SKIN EVERY 14 DAYS 07/29/21 07/29/22  Bo Merino, MD  albuterol (PROVENTIL) (2.5 MG/3ML) 0.083% nebulizer solution Take 3 mLs (2.5 mg total) by nebulization every 6 (six) hours as needed for wheezing or shortness of breath. 07/13/21   Minette Brine, FNP  albuterol (VENTOLIN HFA) 108 (90 Base) MCG/ACT inhaler Inhale 2 puffs into the lungs every 4 (four) hours as needed for wheezing or shortness of breath (cough, shortness of breath or wheezing.). 10/11/19   Robyn Haber, MD  amLODipine (NORVASC) 10 MG tablet TAKE 1 TABLET BY MOUTH EVERY DAY 03/27/21   Minette Brine, FNP  aspirin 325 MG tablet Take 325 mg by mouth every 6 (six) hours as needed (pain).    [provider]  atorvastatin (LIPITOR) 20 MG tablet TAKE 1 TABLET BY MOUTH EVERY DAY 07/24/21   Minette Brine, FNP  B-D TB SYRINGE 1CC/27GX1/2" 27G X 1/2" 1 ML MISC USE 1 SYRINGE TO INJECT METHOTREXATE INTO THE SKIN ONCE A WEEK. 07/23/19   Bo Merino, MD  blood glucose meter kit and supplies KIT Dispense based on patient and insurance preference. Use up to two times daily as directed. (FOR  ICD-9 250.00, 250.01). 01/17/20   Minette Brine, FNP  chlorhexidine (HIBICLENS) 4 % external liquid Apply topically daily as needed. Dilute 10-15 mL in water, Use daily when bathing for 1-2 weeks 12/01/20   Melynda Ripple, MD  diphenhydrAMINE (BENADRYL) 25 MG tablet Take 25 mg by mouth every 6 (six) hours as needed for itching, allergies or sleep.    [provider]  DULoxetine (CYMBALTA) 60 MG capsule Take 60 mg by mouth daily.    [provider]  ezetimibe (ZETIA) 10 MG tablet TAKE 1 TABLET  BY MOUTH EVERY DAY 03/27/21   Minette Brine, FNP  folic acid (FOLVITE) 1 MG tablet Take 2 tablets (2 mg total) by mouth daily. 02/07/20   Ofilia Neas, PA-C  hydrochlorothiazide (HYDRODIURIL) 12.5 MG tablet TAKE 1 TABLET (12.5 MG TOTAL) BY MOUTH DAILY AS NEEDED (FLUID/EDEMA). 03/27/21   Minette Brine, FNP  HYDROcodone-acetaminophen (NORCO) 10-325 MG tablet Take 1 tablet by mouth every 6 (six) hours. 08/20/19   [provider]  hydrOXYzine (ATARAX/VISTARIL) 25 MG tablet Take 1 tablet (25 mg total) by mouth every 8 (eight) hours as needed for itching. 05/15/20   , Myrene Galas, MD  Lancets (ONETOUCH DELICA PLUS [id number]) MISC 2 (two) times daily. use for testing 01/17/20   [provider]  metFORMIN (GLUCOPHAGE) 500 MG tablet TAKE 1 TABLET BY MOUTH 2 TIMES DAILY WITH A MEAL. 03/27/21   Minette Brine, FNP  methotrexate 50 MG/2ML injection Inject 0.8 mls (20 mg total) into the skin weekly. 07/28/21   Ofilia Neas, PA-C  metoprolol tartrate (LOPRESSOR) 50 MG tablet TAKE 1 TABLET (50 MG TOTAL) BY MOUTH 2 (TWO) TIMES DAILY. PLEASE SCHEDULE APPOINTMENT FOR REFILLS. 05/25/21   Minette Brine, FNP  [facility] VERIO test strip TEST TWICE DAILY 02/11/20   Minette Brine, FNP  predniSONE (STERAPRED UNI-PAK 21 TAB) 10 MG (21) TBPK tablet Take by mouth daily. Take 6 tabs by mouth daily  for 2 days, then 5 tabs for 2 days, then 4 tabs for 2 days, then 3 tabs for 2 days, 2 tabs for 2 days, then 1 tab by mouth daily for 2 days Patient not taking: Reported on 08/24/2021 08/16/21   Pearson Forster, NP  tiZANidine (ZANAFLEX) 4 MG capsule Take 4 mg by mouth every 6 (six) hours. 10/15/19   [provider]  triamcinolone ointment (KENALOG) 0.1 % Apply topically. 09/30/20   [provider]  Vitamin D, Ergocalciferol, (DRISDOL) 1.25 MG (50000 UNIT) CAPS capsule Take 1 capsule by mouth on Tues and one on Fridays. 07/16/21   Bary Castilla, NP    Family History Family History  Problem Relation  Age of Onset   Diabetes Mother    Healthy Father    Heart attack Maternal Grandmother    Stroke Maternal Grandmother     Social History Social History   Tobacco Use   Smoking status: Every Day    Packs/day: 0.50    Years: 20.00    Pack years: 10.00    Types: Cigarettes   Smokeless tobacco: Never  Vaping Use   Vaping Use: Never used  Substance Use Topics   Alcohol use: Not Currently   Drug use: Not Currently    Types: Hydrocodone     Allergies   Patient has no known allergies.   Review of Systems Review of Systems  Constitutional: Negative.   Musculoskeletal: Negative.   Skin:  Positive for color change and wound. Negative for pallor and rash.    Physical  Exam Triage Vital Signs ED Triage Vitals  Enc Vitals Group     BP 08/24/21 1820 (!) 172/107     Pulse Rate 08/24/21 1820 89     Resp 08/24/21 1820 20     Temp 08/24/21 1820 98 F (36.7 C)     Temp Source 08/24/21 1820 Oral     SpO2 08/24/21 1820 99 %     Weight --      Height --      Head Circumference --      Peak Flow --      Pain Score 08/24/21 1816 0     Pain Loc --      Pain Edu? --      Excl. in [place]? --    No data found.  Updated Vital Signs BP (!) 172/107 (BP Location: Left Arm)   Pulse 89   Temp 98 F (36.7 C) (Oral)   Resp 20   SpO2 99%   Visual Acuity Right Eye Distance:   Left Eye Distance:   Bilateral Distance:    Right Eye Near:   Left Eye Near:    Bilateral Near:     Physical Exam Vitals and nursing note reviewed.  Constitutional:      General: She is not in acute distress.    Appearance: She is not ill-appearing.  Cardiovascular:     Rate and Rhythm: Normal rate and regular rhythm.  Musculoskeletal:        General: Normal range of motion.  Skin:    General: Skin is warm.     Comments: Drained abscess on the posterior aspect of the right thigh.  No significant drainage.  Induration around the draining site.  Neurological:     Mental Status: She is alert.     UC  Treatments / Results  Labs (all labs ordered are listed, but only abnormal results are displayed) Labs Reviewed - No data to display  EKG   Radiology No results found.  Procedures Procedures (including critical care time)  Medications Ordered in UC Medications - No data to display  Initial Impression / Assessment and Plan / UC Course  I have reviewed the triage vital signs and the nursing notes.  Pertinent labs & imaging results that were available during my care of the patient were reviewed by me and considered in my medical decision making (see chart for details).     1.  Abscess right thigh, draining: Doxycycline 100 mg twice daily for 7 days Ibuprofen as needed for pain Warm compresses If symptoms worsen please return to urgent care to be reevaluated. Final Clinical Impressions(s) / UC Diagnoses   Final diagnoses:  Abscess     Discharge Instructions      Please take medications as prescribed Continue warm compress If symptoms worsen ,please return to  urgent care for re-evaluation.   ED Prescriptions     Medication Sig Dispense Auth. Provider   doxycycline (VIBRAMYCIN) 100 MG capsule Take 1 capsule (100 mg total) by mouth 2 (two) times daily for 7 days. 14 capsule Georgi Tuel, Myrene Galas, MD      PDMP not reviewed this encounter.   Chase Picket, MD 08/24/21 [phone]

## 2021-09-01 ENCOUNTER — Other Ambulatory Visit (HOSPITAL_COMMUNITY): Payer: Self-pay

## 2021-09-03 ENCOUNTER — Other Ambulatory Visit (HOSPITAL_COMMUNITY): Payer: Self-pay

## 2021-09-04 DIAGNOSIS — M79604 Pain in right leg: Secondary | ICD-10-CM | POA: Diagnosis not present

## 2021-09-04 DIAGNOSIS — I10 Essential (primary) hypertension: Secondary | ICD-10-CM | POA: Diagnosis not present

## 2021-09-04 DIAGNOSIS — M1712 Unilateral primary osteoarthritis, left knee: Secondary | ICD-10-CM | POA: Diagnosis not present

## 2021-09-04 DIAGNOSIS — G894 Chronic pain syndrome: Secondary | ICD-10-CM | POA: Diagnosis not present

## 2021-09-04 DIAGNOSIS — Z79891 Long term (current) use of opiate analgesic: Secondary | ICD-10-CM | POA: Diagnosis not present

## 2021-09-04 DIAGNOSIS — M25512 Pain in left shoulder: Secondary | ICD-10-CM | POA: Diagnosis not present

## 2021-09-04 DIAGNOSIS — G4733 Obstructive sleep apnea (adult) (pediatric): Secondary | ICD-10-CM | POA: Diagnosis not present

## 2021-09-04 DIAGNOSIS — M542 Cervicalgia: Secondary | ICD-10-CM | POA: Diagnosis not present

## 2021-09-04 DIAGNOSIS — M79671 Pain in right foot: Secondary | ICD-10-CM | POA: Diagnosis not present

## 2021-09-04 DIAGNOSIS — M25521 Pain in right elbow: Secondary | ICD-10-CM | POA: Diagnosis not present

## 2021-09-04 DIAGNOSIS — M545 Low back pain, unspecified: Secondary | ICD-10-CM | POA: Diagnosis not present

## 2021-09-04 DIAGNOSIS — M79672 Pain in left foot: Secondary | ICD-10-CM | POA: Diagnosis not present

## 2021-09-08 ENCOUNTER — Other Ambulatory Visit (HOSPITAL_COMMUNITY): Payer: Self-pay

## 2021-10-03 ENCOUNTER — Other Ambulatory Visit: Payer: Self-pay | Admitting: Nurse Practitioner

## 2021-10-03 DIAGNOSIS — E559 Vitamin D deficiency, unspecified: Secondary | ICD-10-CM

## 2021-10-06 ENCOUNTER — Other Ambulatory Visit (HOSPITAL_COMMUNITY): Payer: Self-pay

## 2021-10-08 ENCOUNTER — Other Ambulatory Visit (HOSPITAL_COMMUNITY): Payer: Self-pay

## 2021-10-12 DIAGNOSIS — M1712 Unilateral primary osteoarthritis, left knee: Secondary | ICD-10-CM | POA: Diagnosis not present

## 2021-10-12 DIAGNOSIS — M25521 Pain in right elbow: Secondary | ICD-10-CM | POA: Diagnosis not present

## 2021-10-12 DIAGNOSIS — M79672 Pain in left foot: Secondary | ICD-10-CM | POA: Diagnosis not present

## 2021-10-12 DIAGNOSIS — G4733 Obstructive sleep apnea (adult) (pediatric): Secondary | ICD-10-CM | POA: Diagnosis not present

## 2021-10-12 DIAGNOSIS — M79671 Pain in right foot: Secondary | ICD-10-CM | POA: Diagnosis not present

## 2021-10-12 DIAGNOSIS — Z79891 Long term (current) use of opiate analgesic: Secondary | ICD-10-CM | POA: Diagnosis not present

## 2021-10-12 DIAGNOSIS — G894 Chronic pain syndrome: Secondary | ICD-10-CM | POA: Diagnosis not present

## 2021-10-12 DIAGNOSIS — M545 Low back pain, unspecified: Secondary | ICD-10-CM | POA: Diagnosis not present

## 2021-10-12 DIAGNOSIS — M79604 Pain in right leg: Secondary | ICD-10-CM | POA: Diagnosis not present

## 2021-10-12 DIAGNOSIS — I10 Essential (primary) hypertension: Secondary | ICD-10-CM | POA: Diagnosis not present

## 2021-10-12 DIAGNOSIS — M542 Cervicalgia: Secondary | ICD-10-CM | POA: Diagnosis not present

## 2021-10-12 DIAGNOSIS — M25512 Pain in left shoulder: Secondary | ICD-10-CM | POA: Diagnosis not present

## 2021-10-13 ENCOUNTER — Other Ambulatory Visit (HOSPITAL_COMMUNITY): Payer: Self-pay

## 2021-10-14 ENCOUNTER — Encounter: Payer: Medicare Other | Admitting: Nurse Practitioner

## 2021-10-15 ENCOUNTER — Other Ambulatory Visit (HOSPITAL_COMMUNITY): Payer: Self-pay

## 2021-10-23 ENCOUNTER — Other Ambulatory Visit: Payer: Self-pay | Admitting: Nurse Practitioner

## 2021-10-23 DIAGNOSIS — E118 Type 2 diabetes mellitus with unspecified complications: Secondary | ICD-10-CM

## 2021-11-02 ENCOUNTER — Telehealth: Payer: Self-pay

## 2021-11-02 NOTE — Telephone Encounter (Signed)
Per Rinaldo Ratel, current Prior Authorization is expiring.  Submitted a Prior Authorization request to [facility] for HUMIRA via CoverMyMeds. Request canceled as patient has access to medication and a PA is not required.    Previous CMM Key: [id number]

## 2021-11-07 DIAGNOSIS — N3090 Cystitis, unspecified without hematuria: Secondary | ICD-10-CM | POA: Diagnosis not present

## 2021-11-07 DIAGNOSIS — N390 Urinary tract infection, site not specified: Secondary | ICD-10-CM | POA: Diagnosis not present

## 2021-11-07 DIAGNOSIS — E119 Type 2 diabetes mellitus without complications: Secondary | ICD-10-CM | POA: Diagnosis not present

## 2021-11-07 DIAGNOSIS — R350 Frequency of micturition: Secondary | ICD-10-CM | POA: Diagnosis not present

## 2021-11-07 DIAGNOSIS — R102 Pelvic and perineal pain: Secondary | ICD-10-CM | POA: Diagnosis not present

## 2021-11-11 ENCOUNTER — Other Ambulatory Visit: Payer: Self-pay | Admitting: Rheumatology

## 2021-11-11 ENCOUNTER — Other Ambulatory Visit (HOSPITAL_COMMUNITY): Payer: Self-pay

## 2021-11-11 DIAGNOSIS — Z79899 Other long term (current) drug therapy: Secondary | ICD-10-CM

## 2021-11-11 MED ORDER — HUMIRA PEN 40 MG/0.8ML ~~LOC~~ PNKT
PEN_INJECTOR | SUBCUTANEOUS | 0 refills | Status: DC
  Filled 2021-11-11: qty 2, 28d supply, fill #0

## 2021-11-11 NOTE — Telephone Encounter (Signed)
Next Visit: 12/22/2021   Last Visit: 07/28/2021   Last Fill: 07/29/2021   DX: Rheumatoid arthritis of multiple sites without rheumatoid factor    Current Dose per office note 07/28/2021: Humira 40 mg subcutaneous injections every 14 days   Labs: 07/15/2021 Glucose 117,    TB Gold: 07/10/2020 Neg (Updated on 07/28/2021)  Patient advised she is due to update labs. Patient states she will come tomorrow to update.    Okay to refill Humira?

## 2021-11-16 ENCOUNTER — Other Ambulatory Visit (HOSPITAL_COMMUNITY): Payer: Self-pay

## 2021-11-26 ENCOUNTER — Encounter (HOSPITAL_COMMUNITY): Payer: Self-pay | Admitting: Family Medicine

## 2021-11-26 ENCOUNTER — Emergency Department (HOSPITAL_COMMUNITY): Payer: Medicare HMO

## 2021-11-26 ENCOUNTER — Inpatient Hospital Stay (HOSPITAL_COMMUNITY)
Admission: EM | Admit: 2021-11-26 | Discharge: 2021-11-30 | DRG: 872 | Disposition: A | Discharge status: Home / Self Care | Payer: Medicare HMO | Attending: Internal Medicine | Admitting: Internal Medicine

## 2021-11-26 ENCOUNTER — Other Ambulatory Visit: Payer: Self-pay

## 2021-11-26 DIAGNOSIS — J9811 Atelectasis: Secondary | ICD-10-CM | POA: Diagnosis not present

## 2021-11-26 DIAGNOSIS — A4151 Sepsis due to Escherichia coli [E. coli]: Secondary | ICD-10-CM | POA: Diagnosis not present

## 2021-11-26 DIAGNOSIS — N12 Tubulo-interstitial nephritis, not specified as acute or chronic: Secondary | ICD-10-CM

## 2021-11-26 DIAGNOSIS — R69 Illness, unspecified: Secondary | ICD-10-CM | POA: Diagnosis not present

## 2021-11-26 DIAGNOSIS — Z79899 Other long term (current) drug therapy: Secondary | ICD-10-CM | POA: Diagnosis not present

## 2021-11-26 DIAGNOSIS — E118 Type 2 diabetes mellitus with unspecified complications: Secondary | ICD-10-CM | POA: Diagnosis present

## 2021-11-26 DIAGNOSIS — Z7984 Long term (current) use of oral hypoglycemic drugs: Secondary | ICD-10-CM | POA: Diagnosis not present

## 2021-11-26 DIAGNOSIS — R600 Localized edema: Secondary | ICD-10-CM

## 2021-11-26 DIAGNOSIS — Z823 Family history of stroke: Secondary | ICD-10-CM

## 2021-11-26 DIAGNOSIS — A419 Sepsis, unspecified organism: Secondary | ICD-10-CM | POA: Diagnosis not present

## 2021-11-26 DIAGNOSIS — R079 Chest pain, unspecified: Secondary | ICD-10-CM | POA: Diagnosis not present

## 2021-11-26 DIAGNOSIS — F17210 Nicotine dependence, cigarettes, uncomplicated: Secondary | ICD-10-CM | POA: Diagnosis present

## 2021-11-26 DIAGNOSIS — Z7982 Long term (current) use of aspirin: Secondary | ICD-10-CM | POA: Diagnosis not present

## 2021-11-26 DIAGNOSIS — M545 Low back pain, unspecified: Secondary | ICD-10-CM | POA: Diagnosis not present

## 2021-11-26 DIAGNOSIS — M797 Fibromyalgia: Secondary | ICD-10-CM | POA: Diagnosis not present

## 2021-11-26 DIAGNOSIS — Z833 Family history of diabetes mellitus: Secondary | ICD-10-CM | POA: Diagnosis not present

## 2021-11-26 DIAGNOSIS — R6520 Severe sepsis without septic shock: Secondary | ICD-10-CM | POA: Diagnosis not present

## 2021-11-26 DIAGNOSIS — R319 Hematuria, unspecified: Secondary | ICD-10-CM | POA: Diagnosis not present

## 2021-11-26 DIAGNOSIS — R000 Tachycardia, unspecified: Secondary | ICD-10-CM | POA: Diagnosis not present

## 2021-11-26 DIAGNOSIS — Z20822 Contact with and (suspected) exposure to covid-19: Secondary | ICD-10-CM | POA: Diagnosis not present

## 2021-11-26 DIAGNOSIS — M0609 Rheumatoid arthritis without rheumatoid factor, multiple sites: Secondary | ICD-10-CM | POA: Diagnosis not present

## 2021-11-26 DIAGNOSIS — I10 Essential (primary) hypertension: Secondary | ICD-10-CM | POA: Diagnosis present

## 2021-11-26 DIAGNOSIS — Z8249 Family history of ischemic heart disease and other diseases of the circulatory system: Secondary | ICD-10-CM

## 2021-11-26 DIAGNOSIS — F17209 Nicotine dependence, unspecified, with unspecified nicotine-induced disorders: Secondary | ICD-10-CM | POA: Diagnosis present

## 2021-11-26 DIAGNOSIS — N200 Calculus of kidney: Secondary | ICD-10-CM | POA: Diagnosis not present

## 2021-11-26 DIAGNOSIS — R6883 Chills (without fever): Secondary | ICD-10-CM | POA: Diagnosis not present

## 2021-11-26 DIAGNOSIS — K8020 Calculus of gallbladder without cholecystitis without obstruction: Secondary | ICD-10-CM | POA: Diagnosis not present

## 2021-11-26 LAB — COMPREHENSIVE METABOLIC PANEL
ALT: 15 U/L (ref 0–44)
AST: 15 U/L (ref 15–41)
Albumin: 4.1 g/dL (ref 3.5–5.0)
Alkaline Phosphatase: 90 U/L (ref 38–126)
Anion gap: 12 (ref 5–15)
BUN: 5 mg/dL — ABNORMAL LOW (ref 6–20)
CO2: 24 mmol/L (ref 22–32)
Calcium: 9.7 mg/dL (ref 8.9–10.3)
Chloride: 99 mmol/L (ref 98–111)
Creatinine, Ser: 0.66 mg/dL (ref 0.44–1.00)
GFR, Estimated: >60 mL/min (ref >60)
Glucose, Bld: 141 mg/dL — ABNORMAL HIGH (ref 70–99)
Potassium: 3.6 mmol/L (ref 3.5–5.1)
Sodium: 135 mmol/L (ref 135–145)
Total Bilirubin: 0.6 mg/dL (ref 0.3–1.2)
Total Protein: 9 g/dL — ABNORMAL HIGH (ref 6.5–8.1)

## 2021-11-26 LAB — URINALYSIS, ROUTINE W REFLEX MICROSCOPIC
Bilirubin Urine: NEGATIVE
Glucose, UA: NEGATIVE mg/dL
Ketones, ur: NEGATIVE mg/dL
Nitrite: NEGATIVE
Protein, ur: NEGATIVE mg/dL
RBC / HPF: >50 RBC/hpf — ABNORMAL HIGH (ref 0–5)
Specific Gravity, Urine: 1.008 (ref 1.005–1.030)
WBC, UA: >50 WBC/hpf — ABNORMAL HIGH (ref 0–5)
pH: 6 (ref 5.0–8.0)

## 2021-11-26 LAB — LACTIC ACID, PLASMA: Lactic Acid, Venous: 3 mmol/L (ref 0.5–1.9)

## 2021-11-26 LAB — CBC WITH DIFFERENTIAL/PLATELET
Abs Immature Granulocytes: 0.07 10*3/uL (ref 0.00–0.07)
Basophils Absolute: 0 10*3/uL (ref 0.0–0.1)
Basophils Relative: 0 %
Eosinophils Absolute: 0 10*3/uL (ref 0.0–0.5)
Eosinophils Relative: 0 %
HCT: 44.4 % (ref 36.0–46.0)
Hemoglobin: 15.1 g/dL — ABNORMAL HIGH (ref 12.0–15.0)
Immature Granulocytes: 1 %
Lymphocytes Relative: 8 %
Lymphs Abs: 1.2 10*3/uL (ref 0.7–4.0)
MCH: 30.1 pg (ref 26.0–34.0)
MCHC: 34 g/dL (ref 30.0–36.0)
MCV: 88.6 fL (ref 80.0–100.0)
Monocytes Absolute: 0.9 10*3/uL (ref 0.1–1.0)
Monocytes Relative: 6 %
Neutro Abs: 12.8 10*3/uL — ABNORMAL HIGH (ref 1.7–7.7)
Neutrophils Relative %: 85 %
Platelets: 378 10*3/uL (ref 150–400)
RBC: 5.01 MIL/uL (ref 3.87–5.11)
RDW: 13.5 % (ref 11.5–15.5)
WBC: 15 10*3/uL — ABNORMAL HIGH (ref 4.0–10.5)
nRBC: 0 % (ref 0.0–0.2)

## 2021-11-26 LAB — PROTIME-INR
INR: 0.9 (ref 0.8–1.2)
Prothrombin Time: 12.3 seconds (ref 11.4–15.2)

## 2021-11-26 LAB — I-STAT BETA HCG BLOOD, ED (MC, WL, AP ONLY): I-stat hCG, quantitative: <5 m[IU]/mL (ref <5)

## 2021-11-26 LAB — APTT: aPTT: 31 seconds (ref 24–36)

## 2021-11-26 MED ORDER — SODIUM CHLORIDE 0.9 % IV BOLUS
1000.0000 mL | Freq: Once | INTRAVENOUS | Status: AC
  Administered 2021-11-26: 1000 mL via INTRAVENOUS

## 2021-11-26 MED ORDER — CEFTRIAXONE SODIUM 1 G IJ SOLR
1.0000 g | Freq: Once | INTRAMUSCULAR | Status: AC
  Administered 2021-11-26: 1 g via INTRAVENOUS
  Filled 2021-11-26: qty 10

## 2021-11-26 MED ORDER — MORPHINE SULFATE (PF) 4 MG/ML IV SOLN
4.0000 mg | Freq: Once | INTRAVENOUS | Status: AC
  Administered 2021-11-26: 4 mg via INTRAVENOUS
  Filled 2021-11-26: qty 1

## 2021-11-26 MED ORDER — SODIUM CHLORIDE 0.9 % IV SOLN: INTRAVENOUS | Status: AC

## 2021-11-26 MED ORDER — METOPROLOL TARTRATE 5 MG/5ML IV SOLN
5.0000 mg | Freq: Once | INTRAVENOUS | Status: AC
  Administered 2021-11-27: 5 mg via INTRAVENOUS
  Filled 2021-11-26: qty 5

## 2021-11-26 MED ORDER — ACETAMINOPHEN 325 MG PO TABS
650.0000 mg | ORAL_TABLET | Freq: Once | ORAL | Status: AC
  Administered 2021-11-26: 650 mg via ORAL
  Filled 2021-11-26: qty 2

## 2021-11-26 MED ORDER — ONDANSETRON HCL 4 MG/2ML IJ SOLN
4.0000 mg | Freq: Once | INTRAMUSCULAR | Status: AC
  Administered 2021-11-26: 4 mg via INTRAVENOUS
  Filled 2021-11-26: qty 2

## 2021-11-26 NOTE — Assessment & Plan Note (Signed)
Given metoprolol IV now. Tachycardia secondary to pain and fever.

## 2021-11-26 NOTE — Assessment & Plan Note (Addendum)
Continue norvasc, HCTZ and metoprolol. Monitor BP. Will provide prn BP medication if pain control does not improve BP

## 2021-11-26 NOTE — ED Provider Notes (Signed)
[facility] EMERGENCY DEPARTMENT Provider Note   CSN: [id number] Arrival date & time: 11/26/21  2022     History  Chief Complaint  Patient presents with   Chills   Back Pain    Shelby Robertson is a 51 y.o. female.  HPI Patient presents with concern of right and mid back pain.  She notes that she was treated for urinary tract infection about 2 weeks ago, completed antibiotics, but over the past days has developed pain, chills, nausea.  No relief with OTC medication.  No chest pain, dyspnea.    Home Medications Prior to Admission medications   Medication Sig Start Date End Date Taking? Authorizing Provider  acetaminophen (TYLENOL) 650 MG CR tablet Take 1,300 mg by mouth every 8 (eight) hours as needed for pain.    [provider]  Adalimumab (HUMIRA PEN) 40 MG/0.8ML PNKT INJECT 1 PEN INTO THE SKIN EVERY 14 DAYS 11/11/21 11/11/22  Bo Merino, MD  albuterol (PROVENTIL) (2.5 MG/3ML) 0.083% nebulizer solution Take 3 mLs (2.5 mg total) by nebulization every 6 (six) hours as needed for wheezing or shortness of breath. 07/13/21   Minette Brine, FNP  albuterol (VENTOLIN HFA) 108 (90 Base) MCG/ACT inhaler Inhale 2 puffs into the lungs every 4 (four) hours as needed for wheezing or shortness of breath (cough, shortness of breath or wheezing.). 10/11/19   Robyn Haber, MD  amLODipine (NORVASC) 10 MG tablet TAKE 1 TABLET BY MOUTH EVERY DAY 03/27/21   Minette Brine, FNP  aspirin 325 MG tablet Take 325 mg by mouth every 6 (six) hours as needed (pain).    [provider]  atorvastatin (LIPITOR) 20 MG tablet TAKE 1 TABLET BY MOUTH EVERY DAY 07/24/21   Minette Brine, FNP  B-D TB SYRINGE 1CC/27GX1/2" 27G X 1/2" 1 ML MISC USE 1 SYRINGE TO INJECT METHOTREXATE INTO THE SKIN ONCE A WEEK. 07/23/19   Bo Merino, MD  blood glucose meter kit and supplies KIT Dispense based on patient and insurance preference. Use up to two times daily as directed. (FOR ICD-9  250.00, 250.01). 01/17/20   Minette Brine, FNP  chlorhexidine (HIBICLENS) 4 % external liquid Apply topically daily as needed. Dilute 10-15 mL in water, Use daily when bathing for 1-2 weeks 12/01/20   Melynda Ripple, MD  diphenhydrAMINE (BENADRYL) 25 MG tablet Take 25 mg by mouth every 6 (six) hours as needed for itching, allergies or sleep.    [provider]  DULoxetine (CYMBALTA) 60 MG capsule Take 60 mg by mouth daily.    [provider]  ezetimibe (ZETIA) 10 MG tablet TAKE 1 TABLET BY MOUTH EVERY DAY 03/27/21   Minette Brine, FNP  folic acid (FOLVITE) 1 MG tablet Take 2 tablets (2 mg total) by mouth daily. 02/07/20   Ofilia Neas, PA-C  hydrochlorothiazide (HYDRODIURIL) 12.5 MG tablet TAKE 1 TABLET (12.5 MG TOTAL) BY MOUTH DAILY AS NEEDED (FLUID/EDEMA). 03/27/21   Minette Brine, FNP  HYDROcodone-acetaminophen (NORCO) 10-325 MG tablet Take 1 tablet by mouth every 6 (six) hours. 08/20/19   [provider]  hydrOXYzine (ATARAX/VISTARIL) 25 MG tablet Take 1 tablet (25 mg total) by mouth every 8 (eight) hours as needed for itching. 05/15/20   [place], Myrene Galas, MD  Lancets (ONETOUCH DELICA PLUS [id number]) MISC 2 (two) times daily. use for testing 01/17/20   [provider]  metFORMIN (GLUCOPHAGE) 500 MG tablet TAKE 1 TABLET BY MOUTH 2 TIMES DAILY WITH A MEAL. 10/23/21   Minette Brine, [place]  methotrexate 50 MG/2ML injection Inject 0.8 mls (20 mg total) into the skin weekly. 07/28/21   Ofilia Neas, PA-C  metoprolol tartrate (LOPRESSOR) 50 MG tablet TAKE 1 TABLET (50 MG TOTAL) BY MOUTH 2 (TWO) TIMES DAILY. PLEASE SCHEDULE APPOINTMENT FOR REFILLS. 05/25/21   Minette Brine, FNP  [facility] VERIO test strip TEST TWICE DAILY 02/11/20   Minette Brine, FNP  predniSONE (STERAPRED UNI-PAK 21 TAB) 10 MG (21) TBPK tablet Take by mouth daily. Take 6 tabs by mouth daily  for 2 days, then 5 tabs for 2 days, then 4 tabs for 2 days, then 3 tabs for 2 days, 2 tabs for 2 days, then 1 tab by mouth  daily for 2 days Patient not taking: Reported on 08/24/2021 08/16/21   Pearson Forster, NP  tiZANidine (ZANAFLEX) 4 MG capsule Take 4 mg by mouth every 6 (six) hours. 10/15/19   [provider]  triamcinolone ointment (KENALOG) 0.1 % Apply topically. 09/30/20   [provider]  Vitamin D, Ergocalciferol, (DRISDOL) 1.25 MG (50000 UNIT) CAPS capsule TAKE 1 CAPSULE BY MOUTH ON TUES AND ONE ON FRIDAYS. 10/05/21   Bary Castilla, NP      Allergies    Patient has no known allergies.    Review of Systems   Review of Systems  Constitutional:        Per HPI, otherwise negative  HENT:         Per HPI, otherwise negative  Respiratory:         Per HPI, otherwise negative  Cardiovascular:        Per HPI, otherwise negative  Gastrointestinal:  Negative for vomiting.  Endocrine:       Negative aside from HPI  Genitourinary:        Neg aside from HPI   Musculoskeletal:        Per HPI, otherwise negative  Skin: Negative.   Neurological:  Negative for syncope.   Physical Exam Updated Vital Signs BP (!) 185/105    Pulse (!) 149    Temp (!) 103.1 F (39.5 C) (Oral)    Resp (!) 27    Ht 5' 6" (1.676 m)    Wt 124.7 kg    SpO2 97%    BMI 44.39 kg/m  Physical Exam Vitals and nursing note reviewed.  Constitutional:      General: She is not in acute distress.    Appearance: She is well-developed.  HENT:     Head: Normocephalic and atraumatic.  Eyes:     Conjunctiva/sclera: Conjunctivae normal.  Cardiovascular:     Rate and Rhythm: Regular rhythm. Tachycardia present.  Pulmonary:     Effort: Pulmonary effort is normal. Tachypnea present. No respiratory distress.     Breath sounds: Normal breath sounds. No stridor.  Abdominal:     General: There is no distension.     Tenderness: There is no abdominal tenderness. There is right CVA tenderness. There is no guarding.  Skin:    General: Skin is warm and dry.  Neurological:     Mental Status: She is alert and oriented to  person, place, and time.     Cranial Nerves: No cranial nerve deficit.    ED Results / Procedures / Treatments   Labs (all labs ordered are listed, but only abnormal results are displayed) Labs Reviewed  LACTIC ACID, PLASMA - Abnormal; Notable for the following components:      Result Value   Lactic Acid, Venous 3.0 (*)  All other components within normal limits  COMPREHENSIVE METABOLIC PANEL - Abnormal; Notable for the following components:   Glucose, Bld 141 (*)    BUN 5 (*)    Total Protein 9.0 (*)    All other components within normal limits  CBC WITH DIFFERENTIAL/PLATELET - Abnormal; Notable for the following components:   WBC 15.0 (*)    Hemoglobin 15.1 (*)    Neutro Abs 12.8 (*)    All other components within normal limits  URINALYSIS, ROUTINE W REFLEX MICROSCOPIC - Abnormal; Notable for the following components:   APPearance CLOUDY (*)    Hgb urine dipstick LARGE (*)    Leukocytes,Ua LARGE (*)    RBC / HPF >50 (*)    WBC, UA >50 (*)    Bacteria, UA RARE (*)    All other components within normal limits  CULTURE, BLOOD (ROUTINE X 2)  CULTURE, BLOOD (ROUTINE X 2)  URINE CULTURE  RESP PANEL BY RT-PCR (FLU A&B, COVID) ARPGX2  PROTIME-INR  APTT  LACTIC ACID, PLASMA  I-STAT BETA HCG BLOOD, ED ([facility], [facility], [facility] ONLY)    EKG EKG Interpretation  Date/Time:  Thursday November 26 2021 20:57:43 EST Ventricular Rate:  145 PR Interval:  120 QRS Duration: 82 QT Interval:  278 QTC Calculation: 431 R Axis:   69 Text Interpretation: Sinus tachycardia Otherwise normal ECG Confirmed by Carmin Muskrat [phone]) on 11/26/2021 9:34:18 PM  Radiology DG Chest Port 1 View  Result Date: 11/26/2021 CLINICAL DATA:  Chills, back pain, sepsis EXAM: PORTABLE CHEST 1 VIEW COMPARISON:  09/16/2019 FINDINGS: Increased interstitial markings without frank interstitial edema. Mild bibasilar atelectasis. No pleural effusion or pneumothorax. The heart is normal in size. Chronic eventration of the right  hemidiaphragm. IMPRESSION: Chronic eventration of the right hemidiaphragm with mild bibasilar atelectasis. No frank interstitial edema. Electronically Signed   By: Julian Hy M.D.   On: 11/26/2021 21:42    Procedures Procedures    Medications Ordered in ED Medications  acetaminophen (TYLENOL) tablet 650 mg (650 mg Oral Given 11/26/21 2118)  cefTRIAXone (ROCEPHIN) 1 g in sodium chloride 0.9 % 100 mL IVPB (1 g Intravenous New Bag/Given 11/26/21 2207)  sodium chloride 0.9 % bolus 1,000 mL (1,000 mLs Intravenous New Bag/Given 11/26/21 2203)  morphine (PF) 4 MG/ML injection 4 mg (4 mg Intravenous Given 11/26/21 2204)  ondansetron (ZOFRAN) injection 4 mg (4 mg Intravenous Given 11/26/21 2204)    ED Course/ Medical Decision Making/ A&P  This patient presents to the ED for concern of flank pain, fever, chills, this involves an extensive number of treatment options, and is a complaint that carries with it a high risk of complications and morbidity.  The differential diagnosis includes sepsis, bacteremia, pyelonephritis, infected kidney stone, other intra-abdominal processes   Co morbidities that complicate the patient evaluation  Obesity, diabetes, hypertension, hyperlipidemia, rheumatoid, on immunomodulatory agents   Social Determinants of Health:  Obesity smoking   Additional history obtained:  Additional history and/or information obtained from chart review External records from outside source obtained and reviewed including clinic notes from last year with ongoing efforts to control diabetes   After the initial evaluation, orders, including: Labs, CT, as well as IV fluids, and empiric antibiotics for presumed urinary tract infection source given the patient meets SIRS criteria on arrival were initiated.  Patient placed on Cardiac and Pulse-Oximetry Monitors. The patient was maintained on a cardiac monitor.  The cardiac monitored showed an rhythm of sinus tach 150 abnormal The patient  was also  maintained on pulse oximetry. The readings were typically 99% room air normal  On repeat evaluation of the patient stayed the same  Lab Tests:  I personally interpreted labs.  The pertinent results include: Lactic acidosis, leukocytosis  Imaging Studies ordered:  I independently visualized and interpreted imaging which showed likely cystitis, renal cyst, no obvious stone I agree with the radiologist interpretation  Consultations Obtained:  I requested consultation with the internal medicine,  and discussed lab and imaging findings as well as pertinent plan - they recommend: Admission for cystitis versus pyelonephritis  Dispostion / Final MDM:  After consideration of the diagnostic results and the patient's response to treatment, she will require admission for sepsis with urinary tract source.  Patient meets sepsis criteria with tachycardia, tachypnea, fever, urinary source, but is not hypotensive, thus received fluid resuscitation, but not per protocol 30 mL/kg.  I feel that the patent would benefit from admission.   Final Clinical Impression(s) / ED Diagnoses Final diagnoses:  Sepsis ([place])  CRITICAL CARE Performed by: Carmin Muskrat Total critical care time: 40 minutes Critical care time was exclusive of separately billable procedures and treating other patients. Critical care was necessary to treat or prevent imminent or life-threatening deterioration. Critical care was time spent personally by me on the following activities: development of treatment plan with patient and/or surrogate as well as nursing, discussions with consultants, evaluation of patient's response to treatment, examination of patient, obtaining history from patient or surrogate, ordering and performing treatments and interventions, ordering and review of laboratory studies, ordering and review of radiographic studies, pulse oximetry and re-evaluation of patient's condition.    Carmin Muskrat,  MD 11/26/21 2312

## 2021-11-26 NOTE — Assessment & Plan Note (Signed)
Nicotine patch provided to prevent withdrawal. Advised to quit tobacco use and health hazards reviewed

## 2021-11-26 NOTE — Assessment & Plan Note (Signed)
Shelby Robertson is admitted to Med-Surg floor Started on Rocephin for antibiotic coverage Cultures obtained in the ER and will be monitored. Antibiotic will be adjusted if indicated by culture results IVF hydration with LR  Check CBC, electrolytes and renal function in am

## 2021-11-26 NOTE — Assessment & Plan Note (Signed)
stable °

## 2021-11-26 NOTE — H&P (Signed)
History and Physical    Patient: Shelby Robertson DOB: January 26, 1971 DOA: 11/26/2021 DOS: the patient was seen and examined on 11/26/2021 PCP: Minette Brine, [place]  Patient coming from: Home  Chief Complaint:  Chief Complaint  Patient presents with   Chills   Back Pain    HPI: Shelby Robertson is a 51 y.o. female with medical history significant of HTN, DMT2, RA, Fibromyalgia who presents for evaluation of fever, low back pain, urinary frequency and dysuria. She reports that today she developed fever and chills and had decreased appetite. She has been fatigued today. Has had nausea but no vomiting. She reports she had a UTI 2 weeks when in [place] on vacation and was treated with antibiotic that she completed. She does not remember which antibiotic it was. She has used tylenol for fever and body aches but has not helped much.  She denies having any diarrhea.  Over the course of the day low back pain and right flank pain is worsened to the point that she could not take it anymore and came to the hospital. She smokes half pack insert today.  She denies illicit drug use or alcohol use  In the emergency room she was found to have a fever with a Tmax of 103.1 degrees and has had tachycardia and elevated blood pressure.  She meets SIRS criteria with leukocytosis, fever, tachycardia but has no endorgan damage or signs of sepsis.  Initial lactic acid was elevated at 3 and she has been given IV fluids.  We will recheck lactic acid after fluid hydration.  Blood pressure has been 170-215/94-127 in the emergency room.  Given dose of metoprolol IV.  She was given antibiotic with Rocephin.  Cultures were obtained in the emergency room.  Hospital service been asked to admit for further management  Review of Systems:  General: Reports  fever, chills. Denies weight loss, night sweats.  Denies dizziness. HENT: Denies head trauma, headache, denies  tinnitus. Denies nasal bleeding. Denies sore throat.   Eyes: Denies blurry vision, pain in eye, drainage.  Denies discoloration of eyes. Neck: Denies pain.  Denies swelling.  Denies pain with movement. Cardiovascular: Denies chest pain, palpitations.  Denies edema.  Denies orthopnea Respiratory: Denies shortness of breath, cough.  Denies wheezing.  Denies sputum production Gastrointestinal: Denies abdominal pain, swelling.  Denies vomiting, diarrhea.  Denies melena.  Musculoskeletal: Denies limitation of movement.  Denies deformity or swelling.  Denies pain.  Denies arthralgias or myalgias. Genitourinary: Reports urinary frequency, dysuria. Reports flank and low back pain.Denies pelvic pain.  Skin: Denies rash.  Denies petechiae, purpura, ecchymosis. Neurological:  Denies syncope. Denies seizure activity.  Denies slurred speech, drooping face.  Psychiatric: Denies depression, anxiety. Denies hallucinations.   Past Medical History:  Diagnosis Date   Diabetes mellitus without complication (HCC)    Fibromyalgia    Fibromyalgia    Hypertension    Osteoarthritis    Rheumatoid arthritis(714.0)    Rheumatoid arthritis(714.0)    History reviewed. No pertinent surgical history. Social History:  reports that she has been smoking cigarettes. She has a 10.00 pack-year smoking history. She has never used smokeless tobacco. She reports that she does not currently use alcohol. She reports that she does not currently use drugs after having used the following drugs: Hydrocodone.  No Known Allergies  Family History  Problem Relation Age of Onset   Diabetes Mother    Healthy Father    Heart attack Maternal Grandmother    Stroke Maternal Grandmother  Prior to Admission medications   Medication Sig Start Date End Date Taking? Authorizing Provider  acetaminophen (TYLENOL) 650 MG CR tablet Take 1,300 mg by mouth every 8 (eight) hours as needed for pain.    [provider]  Adalimumab (HUMIRA PEN) 40 MG/0.8ML PNKT INJECT 1 PEN INTO THE SKIN  EVERY 14 DAYS 11/11/21 11/11/22  Bo Merino, MD  albuterol (PROVENTIL) (2.5 MG/3ML) 0.083% nebulizer solution Take 3 mLs (2.5 mg total) by nebulization every 6 (six) hours as needed for wheezing or shortness of breath. 07/13/21   Minette Brine, FNP  albuterol (VENTOLIN HFA) 108 (90 Base) MCG/ACT inhaler Inhale 2 puffs into the lungs every 4 (four) hours as needed for wheezing or shortness of breath (cough, shortness of breath or wheezing.). 10/11/19   Robyn Haber, MD  amLODipine (NORVASC) 10 MG tablet TAKE 1 TABLET BY MOUTH EVERY DAY 03/27/21   Minette Brine, FNP  aspirin 325 MG tablet Take 325 mg by mouth every 6 (six) hours as needed (pain).    [provider]  atorvastatin (LIPITOR) 20 MG tablet TAKE 1 TABLET BY MOUTH EVERY DAY 07/24/21   Minette Brine, FNP  B-D TB SYRINGE 1CC/27GX1/2" 27G X 1/2" 1 ML MISC USE 1 SYRINGE TO INJECT METHOTREXATE INTO THE SKIN ONCE A WEEK. 07/23/19   Bo Merino, MD  blood glucose meter kit and supplies KIT Dispense based on patient and insurance preference. Use up to two times daily as directed. (FOR ICD-9 250.00, 250.01). 01/17/20   Minette Brine, FNP  chlorhexidine (HIBICLENS) 4 % external liquid Apply topically daily as needed. Dilute 10-15 mL in water, Use daily when bathing for 1-2 weeks 12/01/20   Melynda Ripple, MD  diphenhydrAMINE (BENADRYL) 25 MG tablet Take 25 mg by mouth every 6 (six) hours as needed for itching, allergies or sleep.    [provider]  DULoxetine (CYMBALTA) 60 MG capsule Take 60 mg by mouth daily.    [provider]  ezetimibe (ZETIA) 10 MG tablet TAKE 1 TABLET BY MOUTH EVERY DAY 03/27/21   Minette Brine, FNP  folic acid (FOLVITE) 1 MG tablet Take 2 tablets (2 mg total) by mouth daily. 02/07/20   Ofilia Neas, PA-C  hydrochlorothiazide (HYDRODIURIL) 12.5 MG tablet TAKE 1 TABLET (12.5 MG TOTAL) BY MOUTH DAILY AS NEEDED (FLUID/EDEMA). 03/27/21   Minette Brine, FNP  HYDROcodone-acetaminophen (NORCO) 10-325  MG tablet Take 1 tablet by mouth every 6 (six) hours. 08/20/19   [provider]  hydrOXYzine (ATARAX/VISTARIL) 25 MG tablet Take 1 tablet (25 mg total) by mouth every 8 (eight) hours as needed for itching. 05/15/20   Lamptey, Myrene Galas, MD  Lancets (ONETOUCH DELICA PLUS [id number]) MISC 2 (two) times daily. use for testing 01/17/20   [provider]  metFORMIN (GLUCOPHAGE) 500 MG tablet TAKE 1 TABLET BY MOUTH 2 TIMES DAILY WITH A MEAL. 10/23/21   Minette Brine, FNP  methotrexate 50 MG/2ML injection Inject 0.8 mls (20 mg total) into the skin weekly. 07/28/21   Ofilia Neas, PA-C  metoprolol tartrate (LOPRESSOR) 50 MG tablet TAKE 1 TABLET (50 MG TOTAL) BY MOUTH 2 (TWO) TIMES DAILY. PLEASE SCHEDULE APPOINTMENT FOR REFILLS. 05/25/21   Minette Brine, FNP  [facility] VERIO test strip TEST TWICE DAILY 02/11/20   Minette Brine, FNP  predniSONE (STERAPRED UNI-PAK 21 TAB) 10 MG (21) TBPK tablet Take by mouth daily. Take 6 tabs by mouth daily  for 2 days, then 5 tabs for 2 days, then 4 tabs for 2 days, then  3 tabs for 2 days, 2 tabs for 2 days, then 1 tab by mouth daily for 2 days Patient not taking: Reported on 08/24/2021 08/16/21   Pearson Forster, NP  tiZANidine (ZANAFLEX) 4 MG capsule Take 4 mg by mouth every 6 (six) hours. 10/15/19   [provider]  triamcinolone ointment (KENALOG) 0.1 % Apply topically. 09/30/20   [provider]  Vitamin D, Ergocalciferol, (DRISDOL) 1.25 MG (50000 UNIT) CAPS capsule TAKE 1 CAPSULE BY MOUTH ON TUES AND ONE ON FRIDAYS. 10/05/21   Bary Castilla, NP    Physical Exam: Vitals:   11/26/21 2140 11/26/21 2145 11/26/21 2215 11/26/21 2230  BP: (!) 197/112 (!) 216/127 (!) 171/94 (!) 185/105  Pulse: (!) 152   (!) 149  Resp: (!) 26 (!) 25 (!) 26 (!) 27  Temp:      TempSrc:      SpO2: 96%   97%  Weight:      Height:       General: WDWN, Alert and oriented x3.  Eyes: EOMI, PERRL,  conjunctivae normal.  Sclera nonicteric HENT:  [place]/AT, external  ears normal.  Nares patent without epistasis.  Mucous membranes are dry. Posterior pharynx clear of any exudate  Neck: Soft, normal range of motion, supple, no masses,  Trachea midline Respiratory: clear to auscultation bilaterally, no wheezing, no crackles. Normal respiratory effort. No accessory muscle use.  Cardiovascular: Regular rhythm, tachycardia, no murmurs / rubs / gallops. No extremity edema. 2+ pedal pulses. Abdomen: Soft, no tenderness, nondistended, no rebound or guarding.  No masses palpated. Bowel sounds normoactive. Right CVA tenderness to percussion Musculoskeletal: FROM. no cyanosis. No joint deformity upper and lower extremities. Normal muscle tone.  Skin: Warm, dry, intact no rashes, lesions, ulcers. No induration Neurologic: CN 2-12 grossly intact.  Normal speech. Strength 5/5 in all extremities.   Psychiatric: Normal judgment and insight. Normal mood.    Data Reviewed: Lab work reveals WBC of 15,000 hemoglobin 15.1 hematocrit 44.4 platelet 378,000, lactic acid 3.0 (this is prior to IV fluid hydration) sodium 135 potassium 3.6 chloride 99 bicarb 24 creatinine 0.66 BUN 5 glucose 141 calcium 9.7 LFTs normal , urine pregnancy is negative.  UA cloudy with large leukocyte large blood.  COVID pending  Assessment and Plan: * Pyelonephritis- (present on admission) Ms. Asher is admitted to Med-Surg floor Started on Rocephin for antibiotic coverage Cultures obtained in the ER and will be monitored. Antibiotic will be adjusted if indicated by culture results IVF hydration with LR  Check CBC, electrolytes and renal function in am  HYPERTENSION, BENIGN ESSENTIAL- (present on admission) Continue norvasc, HCTZ and metoprolol. Monitor BP. Will provide prn BP medication if pain control does not improve BP  Controlled type 2 diabetes mellitus with complication, without long-term current use of insulin ([place])- (present on admission) Continue metformin. Monitor blood sugar. Provide SSI  as needed for glycemic control. Check HgbA1c  Tachycardia Given metoprolol IV now. Tachycardia secondary to pain and fever.    Rheumatoid arthritis of multiple sites without rheumatoid factor ([place])- (present on admission) stable  Hematuria Most likely is secondary to infection but with her history of smoking is at risk of bladder cancer and will need cystoscopy as outpatient. If hematuria persists will need urology to evaluate while in hospital.   Tobacco use disorder, continuous- (present on admission) Nicotine patch provided to prevent withdrawal. Advised to quit tobacco use and health hazards reviewed    Advance Care Planning: Code Status: Full Code  Family Communication: Diagnosis  and plan discussed with patient.  She verbalized understanding agrees with plan.  Further recommendations to follow as clinical indicated  Author: Eben Burow, MD 11/26/2021 11:52 PM  For on call review [email or web address].

## 2021-11-26 NOTE — ED Triage Notes (Signed)
Pt presented to ED with c/o new onset chills and lower back pain. Reports having previous UTI a few weeks ago and was treated for same. Endorses frequent and painful urination at times.

## 2021-11-26 NOTE — Assessment & Plan Note (Signed)
Continue metformin. Monitor blood sugar. Provide SSI as needed for glycemic control. Check HgbA1c

## 2021-11-26 NOTE — ED Notes (Signed)
Patient transported to CT 

## 2021-11-26 NOTE — ED Notes (Signed)
Dr. Chotiner at bedside  

## 2021-11-26 NOTE — Assessment & Plan Note (Signed)
Most likely is secondary to infection but with her history of smoking is at risk of bladder cancer and will need cystoscopy as outpatient. If hematuria persists will need urology to evaluate while in hospital.

## 2021-11-26 NOTE — ED Provider Triage Note (Signed)
Emergency Medicine Provider Triage Evaluation Note  Shelby Robertson , a 51 y.o. female  was evaluated in triage.  Pt complains of bilateral low back pain with chills and generalized weakness onset today.  States that she was in the ER in [place] on January 18, diagnosed with a UTI, took antibiotics and symptoms seem to improve until now.  Denies dysuria however states she feels like she cannot hold her urine and will have an accident Temp 103.1  on recheck at time of assessment, last APAP 4 hours ago, APAP ordered Review of Systems  Positive: Fever, chills, back pain Negative: Abdominal pain, vomiting, change in bowel habits, hematuria  Physical Exam  BP (!) 205/123 (BP Location: Right Arm)    Pulse (!) 146    Temp 99.8 F (37.7 C) (Oral)    Resp 20    Ht 5\' 6"  (1.676 m)    Wt 124.7 kg    SpO2 100%    BMI 44.39 kg/m  Gen:   Awake, appears uncomfortable Resp:  Tachypneic MSK:   Moves extremities without difficulty  Other:  No CVA tenderness, abdomen soft nontender  Medical Decision Making  Medically screening exam initiated at 8:59 PM.  Appropriate orders placed.  Darris Carachure was informed that the remainder of the evaluation will be completed by another provider, this initial triage assessment does not replace that evaluation, and the importance of remaining in the ED until their evaluation is complete.  Febrile, hypertensive, tachycardic.  Tylenol ordered in triage as well as sepsis protocol.   Tacy Learn, PA-C 11/26/21 2114

## 2021-11-26 NOTE — ED Notes (Signed)
Triage RN Mortimer Fries notified of patient blood pressure and pulse

## 2021-11-26 NOTE — ED Notes (Signed)
Pt ambulatory to restroom

## 2021-11-27 DIAGNOSIS — R6520 Severe sepsis without septic shock: Secondary | ICD-10-CM

## 2021-11-27 DIAGNOSIS — I10 Essential (primary) hypertension: Secondary | ICD-10-CM

## 2021-11-27 DIAGNOSIS — A4151 Sepsis due to Escherichia coli [E. coli]: Principal | ICD-10-CM

## 2021-11-27 DIAGNOSIS — E118 Type 2 diabetes mellitus with unspecified complications: Secondary | ICD-10-CM

## 2021-11-27 LAB — BASIC METABOLIC PANEL
Anion gap: 11 (ref 5–15)
BUN: 6 mg/dL (ref 6–20)
CO2: 22 mmol/L (ref 22–32)
Calcium: 8.8 mg/dL — ABNORMAL LOW (ref 8.9–10.3)
Chloride: 101 mmol/L (ref 98–111)
Creatinine, Ser: 0.89 mg/dL (ref 0.44–1.00)
GFR, Estimated: >60 mL/min (ref >60)
Glucose, Bld: 169 mg/dL — ABNORMAL HIGH (ref 70–99)
Potassium: 3.5 mmol/L (ref 3.5–5.1)
Sodium: 134 mmol/L — ABNORMAL LOW (ref 135–145)

## 2021-11-27 LAB — BLOOD CULTURE ID PANEL (REFLEXED) - BCID2

## 2021-11-27 LAB — CBG MONITORING, ED
Glucose-Capillary: 109 mg/dL — ABNORMAL HIGH (ref 70–99)
Glucose-Capillary: 117 mg/dL — ABNORMAL HIGH (ref 70–99)
Glucose-Capillary: 128 mg/dL — ABNORMAL HIGH (ref 70–99)

## 2021-11-27 LAB — RESP PANEL BY RT-PCR (FLU A&B, COVID) ARPGX2
Influenza A by PCR: NEGATIVE
Influenza B by PCR: NEGATIVE
SARS Coronavirus 2 by RT PCR: NEGATIVE

## 2021-11-27 LAB — CBC
HCT: 39.2 % (ref 36.0–46.0)
Hemoglobin: 13.2 g/dL (ref 12.0–15.0)
MCH: 29.7 pg (ref 26.0–34.0)
MCHC: 33.7 g/dL (ref 30.0–36.0)
MCV: 88.3 fL (ref 80.0–100.0)
Platelets: 361 10*3/uL (ref 150–400)
RBC: 4.44 MIL/uL (ref 3.87–5.11)
RDW: 13.5 % (ref 11.5–15.5)
WBC: 20.6 10*3/uL — ABNORMAL HIGH (ref 4.0–10.5)
nRBC: 0 % (ref 0.0–0.2)

## 2021-11-27 LAB — HEMOGLOBIN A1C
Hgb A1c MFr Bld: 6.1 % — ABNORMAL HIGH (ref 4.8–5.6)
Mean Plasma Glucose: 128.37 mg/dL

## 2021-11-27 LAB — LACTIC ACID, PLASMA
Lactic Acid, Venous: 2.9 mmol/L (ref 0.5–1.9)
Lactic Acid, Venous: 2.1 mmol/L (ref 0.5–1.9)

## 2021-11-27 LAB — HIV ANTIBODY (ROUTINE TESTING W REFLEX): HIV Screen 4th Generation wRfx: NONREACTIVE

## 2021-11-27 LAB — GLUCOSE, CAPILLARY
Glucose-Capillary: 179 mg/dL — ABNORMAL HIGH (ref 70–99)
Glucose-Capillary: 127 mg/dL — ABNORMAL HIGH (ref 70–99)

## 2021-11-27 MED ORDER — HYDROCHLOROTHIAZIDE 12.5 MG PO TABS: 12.5000 mg | ORAL_TABLET | Freq: Every day | ORAL | Status: DC | PRN

## 2021-11-27 MED ORDER — INSULIN ASPART 100 UNIT/ML IJ SOLN
0.0000 [IU] | Freq: Three times a day (TID) | INTRAMUSCULAR | Status: DC
  Administered 2021-11-27: 3 [IU] via SUBCUTANEOUS
  Administered 2021-11-27 – 2021-11-29 (×7): 2 [IU] via SUBCUTANEOUS

## 2021-11-27 MED ORDER — KETOROLAC TROMETHAMINE 15 MG/ML IJ SOLN
15.0000 mg | Freq: Once | INTRAMUSCULAR | Status: AC
  Administered 2021-11-27: 15 mg via INTRAVENOUS
  Filled 2021-11-27: qty 1

## 2021-11-27 MED ORDER — ACETAMINOPHEN 325 MG PO TABS
650.0000 mg | ORAL_TABLET | Freq: Four times a day (QID) | ORAL | Status: DC | PRN
  Administered 2021-11-27: 650 mg via ORAL

## 2021-11-27 MED ORDER — ATORVASTATIN CALCIUM 10 MG PO TABS
20.0000 mg | ORAL_TABLET | Freq: Every day | ORAL | Status: DC
Start: 2021-11-27 — End: 2021-11-30
  Administered 2021-11-27 – 2021-11-30 (×4): 20 mg via ORAL
  Filled 2021-11-27 (×4): qty 2

## 2021-11-27 MED ORDER — EZETIMIBE 10 MG PO TABS
10.0000 mg | ORAL_TABLET | Freq: Every day | ORAL | Status: DC
Start: 2021-11-27 — End: 2021-11-30
  Administered 2021-11-27 – 2021-11-30 (×4): 10 mg via ORAL
  Filled 2021-11-27 (×4): qty 1

## 2021-11-27 MED ORDER — SODIUM CHLORIDE 0.9 % IV SOLN: 2.0000 g | INTRAVENOUS | Status: DC

## 2021-11-27 MED ORDER — ONDANSETRON HCL 4 MG/2ML IJ SOLN: 4.0000 mg | Freq: Four times a day (QID) | INTRAMUSCULAR | Status: DC | PRN

## 2021-11-27 MED ORDER — ACETAMINOPHEN 650 MG RE SUPP: 650.0000 mg | Freq: Four times a day (QID) | RECTAL | Status: DC | PRN

## 2021-11-27 MED ORDER — METOPROLOL TARTRATE 50 MG PO TABS
50.0000 mg | ORAL_TABLET | Freq: Two times a day (BID) | ORAL | Status: DC
  Administered 2021-11-27 – 2021-11-29 (×5): 50 mg via ORAL
  Filled 2021-11-27: qty 1
  Filled 2021-11-27: qty 2
  Filled 2021-11-27 (×3): qty 1

## 2021-11-27 MED ORDER — SODIUM CHLORIDE 0.9 % IV SOLN: 1.0000 g | INTRAVENOUS | Status: DC

## 2021-11-27 MED ORDER — ENOXAPARIN SODIUM 40 MG/0.4ML IJ SOSY
40.0000 mg | PREFILLED_SYRINGE | INTRAMUSCULAR | Status: DC
  Administered 2021-11-27 – 2021-11-29 (×3): 40 mg via SUBCUTANEOUS
  Filled 2021-11-27 (×3): qty 0.4

## 2021-11-27 MED ORDER — SENNOSIDES-DOCUSATE SODIUM 8.6-50 MG PO TABS: 1.0000 | ORAL_TABLET | Freq: Every evening | ORAL | Status: DC | PRN

## 2021-11-27 MED ORDER — ONDANSETRON HCL 4 MG PO TABS: 4.0000 mg | ORAL_TABLET | Freq: Four times a day (QID) | ORAL | Status: DC | PRN

## 2021-11-27 MED ORDER — AMLODIPINE BESYLATE 10 MG PO TABS
10.0000 mg | ORAL_TABLET | Freq: Every day | ORAL | Status: DC
  Administered 2021-11-27 – 2021-11-29 (×3): 10 mg via ORAL
  Filled 2021-11-27: qty 1
  Filled 2021-11-27: qty 2
  Filled 2021-11-27: qty 1

## 2021-11-27 MED ORDER — LACTATED RINGERS IV SOLN: INTRAVENOUS | Status: DC

## 2021-11-27 MED ORDER — SODIUM CHLORIDE 0.9 % IV SOLN
2.0000 g | INTRAVENOUS | Status: DC
  Administered 2021-11-27 – 2021-11-28 (×2): 2 g via INTRAVENOUS
  Filled 2021-11-27 (×2): qty 20

## 2021-11-27 MED ORDER — HYDROMORPHONE HCL 1 MG/ML IJ SOLN
1.0000 mg | INTRAMUSCULAR | Status: DC | PRN
  Administered 2021-11-27 – 2021-11-28 (×2): 1 mg via INTRAVENOUS
  Filled 2021-11-27 (×2): qty 1

## 2021-11-27 MED ORDER — METFORMIN HCL 500 MG PO TABS
500.0000 mg | ORAL_TABLET | Freq: Two times a day (BID) | ORAL | Status: DC
  Administered 2021-11-27: 500 mg via ORAL
  Filled 2021-11-27: qty 1

## 2021-11-27 MED ORDER — HYDROMORPHONE HCL 1 MG/ML IJ SOLN
1.0000 mg | INTRAMUSCULAR | Status: AC | PRN
  Administered 2021-11-27 (×4): 1 mg via INTRAVENOUS
  Filled 2021-11-27 (×4): qty 1

## 2021-11-27 MED ORDER — IBUPROFEN 400 MG PO TABS
600.0000 mg | ORAL_TABLET | Freq: Once | ORAL | Status: AC
  Administered 2021-11-27: 600 mg via ORAL
  Filled 2021-11-27: qty 1

## 2021-11-27 NOTE — Progress Notes (Signed)
NEW ADMISSION NOTE New Admission Note:   Arrival Method:  Stretcher Mental Orientation:  A& O 4 Telemetry: Box 22 Assessment: Completed Skin: See assessment IV: Right arm infusing Pain: 8 on 1-10 scale Tubes: none Safety Measures: Safety Fall Prevention Plan has been given, discussed and signed Admission: Completed [facility] Orientation: Patient has been orientated to the room, unit and staff.  Family: not present  Orders have been reviewed and implemented. Will continue to monitor the patient. Call light has been placed within reach and bed alarm has been activated.   Berneta Levins, RN

## 2021-11-27 NOTE — ED Notes (Signed)
MD notified that pt is tachycardic and tachypnic

## 2021-11-27 NOTE — Progress Notes (Signed)
Sepsis present on admission due to pyelonephritis PROGRESS NOTE    Shelby Robertson  DXI:[id number] DOB: [birth year] DOA: 11/26/2021 PCP: Minette Brine, FNP   Chief Complaint  Patient presents with   Chills   Back Pain    Brief Narrative:    Shelby Robertson is a 51 y.o. female with medical history significant of HTN, DMT2, RA, Fibromyalgia who presents for evaluation of fever, low back pain, urinary frequency and dysuria. She reports that today she developed fever and chills and had decreased appetite. She has been fatigued today. Has had nausea but no vomiting. She reports she had a UTI 2 weeks when in [place] on vacation and was treated with antibiotic that she completed. She does not remember which antibiotic it was. She has used tylenol for fever and body aches but has not helped much.  She denies having any diarrhea.  Over the course of the day low back pain and right flank pain is worsened to the point that she could not take it anymore and came to the hospital. She smokes half pack insert today.  She denies illicit drug use or alcohol use   In the emergency room she was found to have a fever with a Tmax of 103.1 degrees and has had tachycardia and elevated blood pressure.  She meets SIRS criteria with leukocytosis, fever, tachycardia but has no endorgan damage or signs of sepsis.  Initial lactic acid was elevated at 3 and she has been given IV fluids.  We will recheck lactic acid after fluid hydration.  Blood pressure has been 170-215/94-127 in the emergency room.  Given dose of metoprolol IV.  She was given antibiotic with Rocephin.  Cultures were obtained in the emergency room.  Hospital service been asked to admit for further management    Assessment & Plan:   Principal Problem:   Pyelonephritis Active Problems:   HYPERTENSION, BENIGN ESSENTIAL   Rheumatoid arthritis of multiple sites without rheumatoid factor (HCC)   Tobacco use disorder, continuous   Controlled type 2  diabetes mellitus with complication, without long-term current use of insulin (HCC)   Hematuria   Tachycardia   Sepsis due to to E. Coli/pyelonephritis  bacteremia (present on admission) -Sepsis present on admission -Blood culture growing E. coli, continue with IV Rocephin, follow on final sensitivity, will increase the dose to 2 g IV daily -Patient with right CVA tenderness with clinical concern of right acute pyelonephritis. -Continue with IV fluids   HYPERTENSION, BENIGN ESSENTIAL- (present on admission) Continue norvasc, HCTZ and metoprolol. Monitor BP. Will provide prn BP medication if pain control does not improve BP   Controlled type 2 diabetes mellitus with complication, without long-term current use of insulin ([place])- (present on admission) We will hold metformin, continue with insulin sliding scale, -controlled with A1c of 6.1   Rheumatoid arthritis of multiple sites without rheumatoid factor ([place])- (present on admission) stable   Hematuria Most likely is secondary to infection but with her history of smoking is at risk of bladder cancer and will need cystoscopy as outpatient. If hematuria persists will need urology to evaluate while in hospital.    Tobacco use disorder, continuous- (present on admission) Nicotine patch provided to prevent withdrawal. Advised to quit tobacco use and health hazards reviewed       DVT prophylaxis: Lovenox Code Status: Full Family Communication: None at bedside Disposition:   Status is: Inpatient Remains inpatient appropriate because: bacteremia           Consultants:  None  Subjective:  She complains of right flank pain, chills, weakness and fatigue. Objective: Vitals:   11/27/21 0800 11/27/21 0900 11/27/21 1100 11/27/21 1200  BP: (!) 141/88 135/77 118/76 135/65  Pulse:  (!) 109    Resp: 18 13 19  (!) 31  Temp:      TempSrc:      SpO2:  100%    Weight:      Height:        Intake/Output Summary (Last 24 hours)  at 11/27/2021 1233 Last data filed at 11/26/2021 2259 Gross per 24 hour  Intake 101.57 ml  Output --  Net 101.57 ml   Filed Weights   11/26/21 2059  Weight: 124.7 kg    Examination:  Awake Alert, Oriented X 3, No new F.N deficits, Normal affect Symmetrical Chest wall movement, Good air movement bilaterally, CTAB RRR,No Gallops,Rubs or new Murmurs, No Parasternal Heave +ve B.Sounds, Abd Soft, right CVA tenderness No Cyanosis, Clubbing or edema, No new Rash or bruise       Data Reviewed: I have personally reviewed following labs and imaging studies  CBC: Recent Labs  Lab 11/26/21 2134 11/27/21 0343  WBC 15.0* 20.6*  NEUTROABS 12.8*  --   HGB 15.1* 13.2  HCT 44.4 39.2  MCV 88.6 88.3  PLT 378 962    Basic Metabolic Panel: Recent Labs  Lab 11/26/21 2134 11/27/21 0343  NA 135 134*  K 3.6 3.5  CL 99 101  CO2 24 22  GLUCOSE 141* 169*  BUN 5* 6  CREATININE 0.66 0.89  CALCIUM 9.7 8.8*    GFR: Estimated Creatinine Clearance: 103.2 mL/min (by C-G formula based on SCr of 0.89 mg/dL).  Liver Function Tests: Recent Labs  Lab 11/26/21 2134  AST 15  ALT 15  ALKPHOS 90  BILITOT 0.6  PROT 9.0*  ALBUMIN 4.1    CBG: Recent Labs  Lab 11/27/21 0735 11/27/21 0752 11/27/21 1225  GLUCAP 117* 109* 128*     Recent Results (from the past 240 hour(s))  Blood Culture (routine x 2)     Status: None (Preliminary result)   Collection Time: 11/26/21  9:34 PM   Specimen: BLOOD LEFT ARM  Result Value Ref Range Status   Specimen Description BLOOD LEFT ARM  Final   Special Requests   Final    BOTTLES DRAWN AEROBIC AND ANAEROBIC Blood Culture adequate volume   Culture  Setup Time   Final    GRAM NEGATIVE RODS IN BOTH AEROBIC AND ANAEROBIC BOTTLES CRITICAL RESULT CALLED TO, READ BACK BY AND VERIFIED WITH: PHARMD E.MARTIN AT 9528 ON 11/27/2021 BY T.SAAD. Performed at [facility], [place] [address], [place], [place] [address]    Culture GRAM NEGATIVE RODS  Final    Report Status PENDING  Incomplete  Blood Culture (routine x 2)     Status: None (Preliminary result)   Collection Time: 11/26/21  9:34 PM   Specimen: BLOOD RIGHT ARM  Result Value Ref Range Status   Specimen Description BLOOD RIGHT ARM  Final   Special Requests AEROBIC BOTTLE ONLY Blood Culture adequate volume  Final   Culture  Setup Time   Final    GRAM NEGATIVE RODS AEROBIC BOTTLE ONLY CRITICAL VALUE NOTED.  VALUE IS CONSISTENT WITH PREVIOUSLY REPORTED AND CALLED VALUE. Performed at [facility], [place] [address] [place], [place] [address]    Culture GRAM NEGATIVE RODS  Final   Report Status PENDING  Incomplete  Blood Culture ID Panel (Reflexed)  Status: Abnormal   Collection Time: 11/26/21  9:34 PM  Result Value Ref Range Status   Enterococcus faecalis NOT DETECTED NOT DETECTED Final   Enterococcus Faecium NOT DETECTED NOT DETECTED Final   Listeria monocytogenes NOT DETECTED NOT DETECTED Final   Staphylococcus species NOT DETECTED NOT DETECTED Final   Staphylococcus aureus (BCID) NOT DETECTED NOT DETECTED Final   Staphylococcus epidermidis NOT DETECTED NOT DETECTED Final   Staphylococcus lugdunensis NOT DETECTED NOT DETECTED Final   Streptococcus species NOT DETECTED NOT DETECTED Final   Streptococcus agalactiae NOT DETECTED NOT DETECTED Final   Streptococcus pneumoniae NOT DETECTED NOT DETECTED Final   Streptococcus pyogenes NOT DETECTED NOT DETECTED Final   A.calcoaceticus-baumannii NOT DETECTED NOT DETECTED Final   Bacteroides fragilis NOT DETECTED NOT DETECTED Final   Enterobacterales DETECTED (A) NOT DETECTED Final    Comment: Enterobacterales represent a large order of gram negative bacteria, not a single organism. CRITICAL RESULT CALLED TO, READ BACK BY AND VERIFIED WITH: PHARMD E.MARTIN AT 0867 ON 11/27/2021 BY T.SAAD.    Enterobacter cloacae complex NOT DETECTED NOT DETECTED Final   Escherichia coli DETECTED (A) NOT DETECTED Final    Comment: CRITICAL RESULT  CALLED TO, READ BACK BY AND VERIFIED WITH: PHARMD E.MARTIN AT 6195 ON 11/27/2021 BY T.SAAD.    Klebsiella aerogenes NOT DETECTED NOT DETECTED Final   Klebsiella oxytoca NOT DETECTED NOT DETECTED Final   Klebsiella pneumoniae NOT DETECTED NOT DETECTED Final   Proteus species NOT DETECTED NOT DETECTED Final   Salmonella species NOT DETECTED NOT DETECTED Final   Serratia marcescens NOT DETECTED NOT DETECTED Final   Haemophilus influenzae NOT DETECTED NOT DETECTED Final   Neisseria meningitidis NOT DETECTED NOT DETECTED Final   Pseudomonas aeruginosa NOT DETECTED NOT DETECTED Final   Stenotrophomonas maltophilia NOT DETECTED NOT DETECTED Final   Candida albicans NOT DETECTED NOT DETECTED Final   Candida auris NOT DETECTED NOT DETECTED Final   Candida glabrata NOT DETECTED NOT DETECTED Final   Candida krusei NOT DETECTED NOT DETECTED Final   Candida parapsilosis NOT DETECTED NOT DETECTED Final   Candida tropicalis NOT DETECTED NOT DETECTED Final   Cryptococcus neoformans/gattii NOT DETECTED NOT DETECTED Final   CTX-M ESBL NOT DETECTED NOT DETECTED Final   Carbapenem resistance IMP NOT DETECTED NOT DETECTED Final   Carbapenem resistance KPC NOT DETECTED NOT DETECTED Final   Carbapenem resistance NDM NOT DETECTED NOT DETECTED Final   Carbapenem resist OXA 48 LIKE NOT DETECTED NOT DETECTED Final   Carbapenem resistance VIM NOT DETECTED NOT DETECTED Final    Comment: Performed at [facility], [address], [place], [place] [address]         Radiology Studies: DG Chest Port 1 View  Result Date: 11/26/2021 CLINICAL DATA:  Chills, back pain, sepsis EXAM: PORTABLE CHEST 1 VIEW COMPARISON:  09/16/2019 FINDINGS: Increased interstitial markings without frank interstitial edema. Mild bibasilar atelectasis. No pleural effusion or pneumothorax. The heart is normal in size. Chronic eventration of the right hemidiaphragm. IMPRESSION: Chronic eventration of the right hemidiaphragm with mild  bibasilar atelectasis. No frank interstitial edema. Electronically Signed   By: Julian Hy M.D.   On: 11/26/2021 21:42   CT Renal Stone Study  Result Date: 11/26/2021 CLINICAL DATA:  Low back pain, chills, recent UTI EXAM: CT ABDOMEN AND PELVIS WITHOUT CONTRAST TECHNIQUE: Multidetector CT imaging of the abdomen and pelvis was performed following the standard protocol without IV contrast. RADIATION DOSE REDUCTION: This exam was performed according to the departmental dose-optimization program  which includes automated exposure control, adjustment of the mA and/or kV according to patient size and/or use of iterative reconstruction technique. COMPARISON:  None. FINDINGS: Lower chest: Lung bases are clear. Eventration of the right hemidiaphragm. Hepatobiliary: Unenhanced liver is unremarkable. Numerous gallstones (series 3/image 36), without associated inflammatory changes. No intrahepatic or extrahepatic ductal dilatation. Pancreas: Within normal limits. Spleen: Within normal limits. Adrenals/Urinary Tract: Adrenal glands are within normal limits. Bilateral renal cysts, poorly evaluated on unenhanced CT, measuring up to 4.2 cm in the posterior right lower kidney (series 3/image 46). Multiple nonobstructing bilateral lower pole renal calculi, measuring up to 12 mm in the right lower pole (series 3/image 47). No hydronephrosis. Thick-walled bladder, although underdistended. Stomach/Bowel: Stomach is within normal limits. No evidence of bowel obstruction. Appendix is not discretely visualized. No colonic wall thickening or inflammatory changes. Vascular/Lymphatic: No evidence of abdominal aortic aneurysm. Atherosclerotic calcifications of the abdominal aorta and branch vessels. No suspicious abdominopelvic lymphadenopathy. Reproductive: Uterus and bilateral ovaries are within normal limits. Other: No abdominopelvic ascites. Musculoskeletal: Mild degenerative changes of the lower thoracic spine. IMPRESSION: Mildly  thick-walled bladder, correlate for cystitis. Multiple nonobstructing bilateral renal calculi, measuring up to 12 mm in the right lower pole. Bilateral renal cysts, poorly evaluated on unenhanced CT, measuring up to 4.2 cm in the right lower kidney. Cholelithiasis, without associated inflammatory changes. Electronically Signed   By: Julian Hy M.D.   On: 11/26/2021 23:01        Scheduled Meds:  amLODipine  10 mg Oral Daily   atorvastatin  20 mg Oral Daily   ezetimibe  10 mg Oral Daily   insulin aspart  0-15 Units Subcutaneous TID WC & HS   metoprolol tartrate  50 mg Oral BID   Continuous Infusions:  cefTRIAXone (ROCEPHIN)  IV     lactated ringers 125 mL/hr at 11/27/21 0956     LOS: 1 day      Phillips Climes, MD [facility]   To contact the attending provider between 7A-7P or the covering provider during after hours 7P-7A, please log into the web site [email or web address] and access using universal [place] password for that web site. If you do not have the password, please call the hospital operator.  11/27/2021, 12:33 PM

## 2021-11-27 NOTE — ED Notes (Signed)
Pt stated that she has 8/10 pain throughout her back. Provided medication check MAR

## 2021-11-27 NOTE — ED Notes (Signed)
Pt given snack and water at this time. 

## 2021-11-27 NOTE — ED Notes (Signed)
Placed pt in recliner to eat her lunch. Provider pt with some cranberry juice. Changed pt gown, linens, and brief.

## 2021-11-27 NOTE — ED Notes (Signed)
Pt using bedside commode at this time.

## 2021-11-27 NOTE — Progress Notes (Signed)
PHARMACY - PHYSICIAN COMMUNICATION CRITICAL VALUE ALERT - BLOOD CULTURE IDENTIFICATION (BCID)  Shelby Robertson is an 51 y.o. female who presented to [facility] on 11/26/2021 with a chief complaint of UTI  Assessment:  29 YOF on antibiotics for UTI/pyelo in the setting of fever, back pain, urinary frequency, and dysuria now with BCx growing GNR with BCID detecting E.coli - no ESBL detected.   Name of physician (or Provider) Contacted: Elgergawy  Current antibiotics: Rocephin 2g IV every 24 hours  Changes to prescribed antibiotics recommended:  Patient is on recommended antibiotics - No changes needed  Results for orders placed or performed during the hospital encounter of 11/26/21  Blood Culture ID Panel (Reflexed) (Collected: 11/26/2021  9:34 PM)  Result Value Ref Range   Enterococcus faecalis NOT DETECTED NOT DETECTED   Enterococcus Faecium NOT DETECTED NOT DETECTED   Listeria monocytogenes NOT DETECTED NOT DETECTED   Staphylococcus species NOT DETECTED NOT DETECTED   Staphylococcus aureus (BCID) NOT DETECTED NOT DETECTED   Staphylococcus epidermidis NOT DETECTED NOT DETECTED   Staphylococcus lugdunensis NOT DETECTED NOT DETECTED   Streptococcus species NOT DETECTED NOT DETECTED   Streptococcus agalactiae NOT DETECTED NOT DETECTED   Streptococcus pneumoniae NOT DETECTED NOT DETECTED   Streptococcus pyogenes NOT DETECTED NOT DETECTED   A.calcoaceticus-baumannii NOT DETECTED NOT DETECTED   Bacteroides fragilis NOT DETECTED NOT DETECTED   Enterobacterales DETECTED (A) NOT DETECTED   Enterobacter cloacae complex NOT DETECTED NOT DETECTED   Escherichia coli DETECTED (A) NOT DETECTED   Klebsiella aerogenes NOT DETECTED NOT DETECTED   Klebsiella oxytoca NOT DETECTED NOT DETECTED   Klebsiella pneumoniae NOT DETECTED NOT DETECTED   Proteus species NOT DETECTED NOT DETECTED   Salmonella species NOT DETECTED NOT DETECTED   Serratia marcescens NOT DETECTED NOT DETECTED   Haemophilus  influenzae NOT DETECTED NOT DETECTED   Neisseria meningitidis NOT DETECTED NOT DETECTED   Pseudomonas aeruginosa NOT DETECTED NOT DETECTED   Stenotrophomonas maltophilia NOT DETECTED NOT DETECTED   Candida albicans NOT DETECTED NOT DETECTED   Candida auris NOT DETECTED NOT DETECTED   Candida glabrata NOT DETECTED NOT DETECTED   Candida krusei NOT DETECTED NOT DETECTED   Candida parapsilosis NOT DETECTED NOT DETECTED   Candida tropicalis NOT DETECTED NOT DETECTED   Cryptococcus neoformans/gattii NOT DETECTED NOT DETECTED   CTX-M ESBL NOT DETECTED NOT DETECTED   Carbapenem resistance IMP NOT DETECTED NOT DETECTED   Carbapenem resistance KPC NOT DETECTED NOT DETECTED   Carbapenem resistance NDM NOT DETECTED NOT DETECTED   Carbapenem resist OXA 48 LIKE NOT DETECTED NOT DETECTED   Carbapenem resistance VIM NOT DETECTED NOT DETECTED    Thank you for allowing pharmacy to be a part of this patients care.  Alycia Rossetti, PharmD, BCPS Clinical Pharmacist 11/27/2021 1:20 PM   **Pharmacist phone directory can now be found on [email or web address] (PW [email or web address]).  Listed under [address].

## 2021-11-27 NOTE — Plan of Care (Addendum)
  Problem: Nutrition: Goal: Adequate nutrition will be maintained Outcome: Progressing   Problem: Elimination: Goal: Will not experience complications related to urinary retention Outcome: Progressing   

## 2021-11-28 DIAGNOSIS — A4151 Sepsis due to Escherichia coli [E. coli]: Secondary | ICD-10-CM

## 2021-11-28 HISTORY — DX: Sepsis due to Escherichia coli (e. coli): A41.51

## 2021-11-28 LAB — CBC
HCT: 36.4 % (ref 36.0–46.0)
Hemoglobin: 12.5 g/dL (ref 12.0–15.0)
MCH: 30.2 pg (ref 26.0–34.0)
MCHC: 34.3 g/dL (ref 30.0–36.0)
MCV: 87.9 fL (ref 80.0–100.0)
Platelets: 284 10*3/uL (ref 150–400)
RBC: 4.14 MIL/uL (ref 3.87–5.11)
RDW: 13.5 % (ref 11.5–15.5)
WBC: 17.8 10*3/uL — ABNORMAL HIGH (ref 4.0–10.5)
nRBC: 0 % (ref 0.0–0.2)

## 2021-11-28 LAB — BASIC METABOLIC PANEL
Anion gap: 10 (ref 5–15)
BUN: 7 mg/dL (ref 6–20)
CO2: 26 mmol/L (ref 22–32)
Calcium: 8.8 mg/dL — ABNORMAL LOW (ref 8.9–10.3)
Chloride: 99 mmol/L (ref 98–111)
Creatinine, Ser: 0.76 mg/dL (ref 0.44–1.00)
GFR, Estimated: >60 mL/min (ref >60)
Glucose, Bld: 124 mg/dL — ABNORMAL HIGH (ref 70–99)
Potassium: 3.6 mmol/L (ref 3.5–5.1)
Sodium: 135 mmol/L (ref 135–145)

## 2021-11-28 LAB — GLUCOSE, CAPILLARY
Glucose-Capillary: 121 mg/dL — ABNORMAL HIGH (ref 70–99)
Glucose-Capillary: 136 mg/dL — ABNORMAL HIGH (ref 70–99)
Glucose-Capillary: 136 mg/dL — ABNORMAL HIGH (ref 70–99)
Glucose-Capillary: 134 mg/dL — ABNORMAL HIGH (ref 70–99)

## 2021-11-28 MED ORDER — DULOXETINE HCL 60 MG PO CPEP
60.0000 mg | ORAL_CAPSULE | Freq: Every day | ORAL | Status: DC
  Administered 2021-11-28 – 2021-11-30 (×3): 60 mg via ORAL
  Filled 2021-11-28 (×3): qty 1

## 2021-11-28 MED ORDER — HYDROMORPHONE HCL 1 MG/ML IJ SOLN
0.5000 mg | INTRAMUSCULAR | Status: DC | PRN
Start: 2021-11-28 — End: 2021-11-30

## 2021-11-28 MED ORDER — HYDROCODONE-ACETAMINOPHEN 10-325 MG PO TABS: 1.0000 | ORAL_TABLET | Freq: Four times a day (QID) | ORAL | Status: DC | PRN

## 2021-11-28 MED ORDER — ALBUTEROL SULFATE (2.5 MG/3ML) 0.083% IN NEBU: 2.5000 mg | INHALATION_SOLUTION | Freq: Four times a day (QID) | RESPIRATORY_TRACT | Status: DC | PRN

## 2021-11-28 MED ORDER — TIZANIDINE HCL 4 MG PO TABS
4.0000 mg | ORAL_TABLET | Freq: Four times a day (QID) | ORAL | Status: DC
  Administered 2021-11-28 – 2021-11-30 (×8): 4 mg via ORAL
  Filled 2021-11-28 (×9): qty 1

## 2021-11-28 NOTE — Plan of Care (Signed)
?  Problem: Nutrition: ?Goal: Adequate nutrition will be maintained ?Outcome: Progressing ?  ?Problem: Coping: ?Goal: Level of anxiety will decrease ?Outcome: Progressing ?  ?Problem: Elimination: ?Goal: Will not experience complications related to urinary retention ?Outcome: Progressing ?  ?

## 2021-11-28 NOTE — Progress Notes (Signed)
Sepsis present on admission due to pyelonephritis PROGRESS NOTE    Shelby Robertson  JKD:[id number] DOB: 1971-02-13 DOA: 11/26/2021 PCP: Minette Brine, FNP   Chief Complaint  Patient presents with   Chills   Back Pain    Brief Narrative:    Shelby Robertson is a 51 y.o. female with medical history significant of HTN, DMT2, RA, Fibromyalgia who presents for evaluation of fever, low back pain, urinary frequency and dysuria. She reports that today she developed fever and chills and had decreased appetite. She has been fatigued today. Has had nausea but no vomiting. She reports she had a UTI 2 weeks when in [place] on vacation and was treated with antibiotic that she completed. She does not remember which antibiotic it was. She has used tylenol for fever and body aches but has not helped much.  She denies having any diarrhea.  Over the course of the day low back pain and right flank pain is worsened to the point that she could not take it anymore and came to the hospital. She smokes half pack insert today.  She denies illicit drug use or alcohol use   In the emergency room she was found to have a fever with a Tmax of 103.1 degrees and has had tachycardia and elevated blood pressure.  She meets SIRS criteria with leukocytosis, fever, tachycardia but has no endorgan damage or signs of sepsis.  Initial lactic acid was elevated at 3 and she has been given IV fluids.  We will recheck lactic acid after fluid hydration.  Blood pressure has been 170-215/94-127 in the emergency room.  Given dose of metoprolol IV.  She was given antibiotic with Rocephin.  Cultures were obtained in the emergency room.  Hospital service been asked to admit for further management    Assessment & Plan:   Principal Problem:   Pyelonephritis Active Problems:   HYPERTENSION, BENIGN ESSENTIAL   Rheumatoid arthritis of multiple sites without rheumatoid factor (HCC)   Tobacco use disorder, continuous   Controlled type 2  diabetes mellitus with complication, without long-term current use of insulin (HCC)   Hematuria   Tachycardia   Sepsis due to Escherichia coli (E. coli) ([place])   Sepsis due to to E. Coli/pyelonephritis  and bacteremia (present on admission) -Sepsis present on admission -Blood culture growing gram-negative rods, urine culture growing E. coli, sensitivities pending, for now continue with IV Rocephin 2 g IV daily.   -Patient with right CVA tenderness with clinical concern of right acute pyelonephritis. -Continue with IV fluids -Leukocytosis count trending down   HYPERTENSION, BENIGN ESSENTIAL- (present on admission) Continue norvasc, HCTZ and metoprolol. Monitor BP. Will provide prn BP medication if pain control does not improve BP   Controlled type 2 diabetes mellitus with complication, without long-term current use of insulin ([place])- (present on admission) We will hold metformin, continue with insulin sliding scale, -controlled with A1c of 6.1   Rheumatoid arthritis of multiple sites without rheumatoid factor ([place])- (present on admission) stable   Hematuria This is due to her infection.  And hemorrhagic cystitis    Tobacco use disorder, continuous- (present on admission) Nicotine patch provided to prevent withdrawal. Advised to quit tobacco use and health hazards reviewed       DVT prophylaxis: Lovenox Code Status: Full Family Communication: None at bedside Disposition:   Status is: Inpatient Remains inpatient appropriate because: bacteremia           Consultants:  None   Subjective:  Patient reports right flank  pain has improved, she denies any fevers or chills currently, reports she is feeling better, but still feeling weak Objective: Vitals:   11/27/21 2059 11/28/21 0208 11/28/21 0535 11/28/21 0918  BP: (!) 154/77 136/77 130/69 130/82  Pulse: (!) 121 (!) 110 (!) 110 (!) 110  Resp: 17 19 18 19   Temp: 99 F (37.2 C) 98.4 F (36.9 C) 98.4 F (36.9 C) 98.4  F (36.9 C)  TempSrc:      SpO2: 96%  (!) 84% 94%  Weight:      Height:        Intake/Output Summary (Last 24 hours) at 11/28/2021 1056 Last data filed at 11/28/2021 0900 Gross per 24 hour  Intake 2580.87 ml  Output 0 ml  Net 2580.87 ml   Filed Weights   11/26/21 2059  Weight: 124.7 kg    Examination:  Awake Alert, Oriented X 3, No new F.N deficits, Normal affect Symmetrical Chest wall movement, Good air movement bilaterally, CTAB RRR,No Gallops,Rubs or new Murmurs, No Parasternal Heave +ve B.Sounds, Abd Soft, No tenderness, No rebound - guarding or rigidity.  Right CVA tenderness appears to have resolved today No Cyanosis, Clubbing or edema, No new Rash or bruise       Data Reviewed: I have personally reviewed following labs and imaging studies  CBC: Recent Labs  Lab 11/26/21 2134 11/27/21 0343 11/28/21 0049  WBC 15.0* 20.6* 17.8*  NEUTROABS 12.8*  --   --   HGB 15.1* 13.2 12.5  HCT 44.4 39.2 36.4  MCV 88.6 88.3 87.9  PLT 378 361 657    Basic Metabolic Panel: Recent Labs  Lab 11/26/21 2134 11/27/21 0343 11/28/21 0049  NA 135 134* 135  K 3.6 3.5 3.6  CL 99 101 99  CO2 24 22 26   GLUCOSE 141* 169* 124*  BUN 5* 6 7  CREATININE 0.66 0.89 0.76  CALCIUM 9.7 8.8* 8.8*    GFR: Estimated Creatinine Clearance: 114.8 mL/min (by C-G formula based on SCr of 0.76 mg/dL).  Liver Function Tests: Recent Labs  Lab 11/26/21 2134  AST 15  ALT 15  ALKPHOS 90  BILITOT 0.6  PROT 9.0*  ALBUMIN 4.1    CBG: Recent Labs  Lab 11/27/21 0752 11/27/21 1225 11/27/21 1531 11/27/21 2140 11/28/21 0753  GLUCAP 109* 128* 179* 127* 121*     Recent Results (from the past 240 hour(s))  Urine Culture     Status: Abnormal (Preliminary result)   Collection Time: 11/26/21  8:59 PM   Specimen: In/Out Cath Urine  Result Value Ref Range Status   Specimen Description IN/OUT CATH URINE  Final   Special Requests NONE  Final   Culture (A)  Final    >=100,000 COLONIES/mL  GRAM NEGATIVE RODS SUSCEPTIBILITIES TO FOLLOW Performed at [facility], [place] [address], [place], [place] [address]    Report Status PENDING  Incomplete  Blood Culture (routine x 2)     Status: None (Preliminary result)   Collection Time: 11/26/21  9:34 PM   Specimen: BLOOD LEFT ARM  Result Value Ref Range Status   Specimen Description BLOOD LEFT ARM  Final   Special Requests   Final    BOTTLES DRAWN AEROBIC AND ANAEROBIC Blood Culture adequate volume   Culture  Setup Time   Final    GRAM NEGATIVE RODS IN BOTH AEROBIC AND ANAEROBIC BOTTLES CRITICAL RESULT CALLED TO, READ BACK BY AND VERIFIED WITH: PHARMD E.MARTIN AT 2952 ON 11/27/2021 BY T.SAAD. Performed at [facility]  Lab, [address] [place], [place] [address]    Culture GRAM NEGATIVE RODS  Final   Report Status PENDING  Incomplete  Blood Culture (routine x 2)     Status: None (Preliminary result)   Collection Time: 11/26/21  9:34 PM   Specimen: BLOOD RIGHT ARM  Result Value Ref Range Status   Specimen Description BLOOD RIGHT ARM  Final   Special Requests AEROBIC BOTTLE ONLY Blood Culture adequate volume  Final   Culture  Setup Time   Final    GRAM NEGATIVE RODS AEROBIC BOTTLE ONLY CRITICAL VALUE NOTED.  VALUE IS CONSISTENT WITH PREVIOUSLY REPORTED AND CALLED VALUE. Performed at [facility], [place] [address], [place], [place] [address]    Culture GRAM NEGATIVE RODS  Final   Report Status PENDING  Incomplete  Blood Culture ID Panel (Reflexed)     Status: Abnormal   Collection Time: 11/26/21  9:34 PM  Result Value Ref Range Status   Enterococcus faecalis NOT DETECTED NOT DETECTED Final   Enterococcus Faecium NOT DETECTED NOT DETECTED Final   Listeria monocytogenes NOT DETECTED NOT DETECTED Final   Staphylococcus species NOT DETECTED NOT DETECTED Final   Staphylococcus aureus (BCID) NOT DETECTED NOT DETECTED Final   Staphylococcus epidermidis NOT DETECTED NOT DETECTED Final   Staphylococcus lugdunensis NOT  DETECTED NOT DETECTED Final   Streptococcus species NOT DETECTED NOT DETECTED Final   Streptococcus agalactiae NOT DETECTED NOT DETECTED Final   Streptococcus pneumoniae NOT DETECTED NOT DETECTED Final   Streptococcus pyogenes NOT DETECTED NOT DETECTED Final   A.calcoaceticus-baumannii NOT DETECTED NOT DETECTED Final   Bacteroides fragilis NOT DETECTED NOT DETECTED Final   Enterobacterales DETECTED (A) NOT DETECTED Final    Comment: Enterobacterales represent a large order of gram negative bacteria, not a single organism. CRITICAL RESULT CALLED TO, READ BACK BY AND VERIFIED WITH: PHARMD E.MARTIN AT 0626 ON 11/27/2021 BY T.SAAD.    Enterobacter cloacae complex NOT DETECTED NOT DETECTED Final   Escherichia coli DETECTED (A) NOT DETECTED Final    Comment: CRITICAL RESULT CALLED TO, READ BACK BY AND VERIFIED WITH: PHARMD E.MARTIN AT 9485 ON 11/27/2021 BY T.SAAD.    Klebsiella aerogenes NOT DETECTED NOT DETECTED Final   Klebsiella oxytoca NOT DETECTED NOT DETECTED Final   Klebsiella pneumoniae NOT DETECTED NOT DETECTED Final   Proteus species NOT DETECTED NOT DETECTED Final   Salmonella species NOT DETECTED NOT DETECTED Final   Serratia marcescens NOT DETECTED NOT DETECTED Final   Haemophilus influenzae NOT DETECTED NOT DETECTED Final   Neisseria meningitidis NOT DETECTED NOT DETECTED Final   Pseudomonas aeruginosa NOT DETECTED NOT DETECTED Final   Stenotrophomonas maltophilia NOT DETECTED NOT DETECTED Final   Candida albicans NOT DETECTED NOT DETECTED Final   Candida auris NOT DETECTED NOT DETECTED Final   Candida glabrata NOT DETECTED NOT DETECTED Final   Candida krusei NOT DETECTED NOT DETECTED Final   Candida parapsilosis NOT DETECTED NOT DETECTED Final   Candida tropicalis NOT DETECTED NOT DETECTED Final   Cryptococcus neoformans/gattii NOT DETECTED NOT DETECTED Final   CTX-M ESBL NOT DETECTED NOT DETECTED Final   Carbapenem resistance IMP NOT DETECTED NOT DETECTED Final    Carbapenem resistance KPC NOT DETECTED NOT DETECTED Final   Carbapenem resistance NDM NOT DETECTED NOT DETECTED Final   Carbapenem resist OXA 48 LIKE NOT DETECTED NOT DETECTED Final   Carbapenem resistance VIM NOT DETECTED NOT DETECTED Final    Comment: Performed at [facility], [address],  [place] [address]  Resp Panel by RT-PCR (Flu A&B, Covid) Nasopharyngeal Swab     Status: None   Collection Time: 11/27/21  3:07 PM   Specimen: Nasopharyngeal Swab; Nasopharyngeal(NP) swabs in vial transport medium  Result Value Ref Range Status   SARS Coronavirus 2 by RT PCR NEGATIVE NEGATIVE Final    Comment: (NOTE) SARS-CoV-2 target nucleic acids are NOT DETECTED.  The SARS-CoV-2 RNA is generally detectable in upper respiratory specimens during the acute phase of infection. The lowest concentration of SARS-CoV-2 viral copies this assay can detect is 138 copies/mL. A negative result does not preclude SARS-Cov-2 infection and should not be used as the sole basis for treatment or other patient management decisions. A negative result may occur with  improper specimen collection/handling, submission of specimen other than nasopharyngeal swab, presence of viral mutation(s) within the areas targeted by this assay, and inadequate number of viral copies(<138 copies/mL). A negative result must be combined with clinical observations, patient history, and epidemiological information. The expected result is Negative.  Fact Sheet for Patients:  EntrepreneurPulse.com.au  Fact Sheet for Healthcare Providers:  IncredibleEmployment.be  This test is no t yet approved or cleared by the [place] FDA and  has been authorized for detection and/or diagnosis of SARS-CoV-2 by FDA under an Emergency Use Authorization (EUA). This EUA will remain  in effect (meaning this test can be used) for the duration of the COVID-19 declaration under Section 564(b)(1) of  the Act, 21 U.S.C.section 360bbb-3(b)(1), unless the authorization is terminated  or revoked sooner.       Influenza A by PCR NEGATIVE NEGATIVE Final   Influenza B by PCR NEGATIVE NEGATIVE Final    Comment: (NOTE) The Xpert Xpress SARS-CoV-2/FLU/RSV plus assay is intended as an aid in the diagnosis of influenza from Nasopharyngeal swab specimens and should not be used as a sole basis for treatment. Nasal washings and aspirates are unacceptable for Xpert Xpress SARS-CoV-2/FLU/RSV testing.  Fact Sheet for Patients: EntrepreneurPulse.com.au  Fact Sheet for Healthcare Providers: IncredibleEmployment.be  This test is not yet approved or cleared by the [place] FDA and has been authorized for detection and/or diagnosis of SARS-CoV-2 by FDA under an Emergency Use Authorization (EUA). This EUA will remain in effect (meaning this test can be used) for the duration of the COVID-19 declaration under Section 564(b)(1) of the Act, 21 U.S.C. section 360bbb-3(b)(1), unless the authorization is terminated or revoked.  Performed at [facility], [place] [address] [place], [place] [address]          Radiology Studies: DG Chest Port 1 View  Result Date: 11/26/2021 CLINICAL DATA:  Chills, back pain, sepsis EXAM: PORTABLE CHEST 1 VIEW COMPARISON:  09/16/2019 FINDINGS: Increased interstitial markings without frank interstitial edema. Mild bibasilar atelectasis. No pleural effusion or pneumothorax. The heart is normal in size. Chronic eventration of the right hemidiaphragm. IMPRESSION: Chronic eventration of the right hemidiaphragm with mild bibasilar atelectasis. No frank interstitial edema. Electronically Signed   By: Julian Hy M.D.   On: 11/26/2021 21:42   CT Renal Stone Study  Result Date: 11/26/2021 CLINICAL DATA:  Low back pain, chills, recent UTI EXAM: CT ABDOMEN AND PELVIS WITHOUT CONTRAST TECHNIQUE: Multidetector CT imaging of the  abdomen and pelvis was performed following the standard protocol without IV contrast. RADIATION DOSE REDUCTION: This exam was performed according to the departmental dose-optimization program which includes automated exposure control, adjustment of the mA and/or kV according to patient size and/or use of iterative reconstruction technique. COMPARISON:  None. FINDINGS: Lower chest: Lung bases are clear. Eventration of the right hemidiaphragm. Hepatobiliary: Unenhanced liver is unremarkable. Numerous gallstones (series 3/image 36), without associated inflammatory changes. No intrahepatic or extrahepatic ductal dilatation. Pancreas: Within normal limits. Spleen: Within normal limits. Adrenals/Urinary Tract: Adrenal glands are within normal limits. Bilateral renal cysts, poorly evaluated on unenhanced CT, measuring up to 4.2 cm in the posterior right lower kidney (series 3/image 46). Multiple nonobstructing bilateral lower pole renal calculi, measuring up to 12 mm in the right lower pole (series 3/image 47). No hydronephrosis. Thick-walled bladder, although underdistended. Stomach/Bowel: Stomach is within normal limits. No evidence of bowel obstruction. Appendix is not discretely visualized. No colonic wall thickening or inflammatory changes. Vascular/Lymphatic: No evidence of abdominal aortic aneurysm. Atherosclerotic calcifications of the abdominal aorta and branch vessels. No suspicious abdominopelvic lymphadenopathy. Reproductive: Uterus and bilateral ovaries are within normal limits. Other: No abdominopelvic ascites. Musculoskeletal: Mild degenerative changes of the lower thoracic spine. IMPRESSION: Mildly thick-walled bladder, correlate for cystitis. Multiple nonobstructing bilateral renal calculi, measuring up to 12 mm in the right lower pole. Bilateral renal cysts, poorly evaluated on unenhanced CT, measuring up to 4.2 cm in the right lower kidney. Cholelithiasis, without associated inflammatory changes.  Electronically Signed   By: Julian Hy M.D.   On: 11/26/2021 23:01        Scheduled Meds:  amLODipine  10 mg Oral Daily   atorvastatin  20 mg Oral Daily   DULoxetine  60 mg Oral Daily   enoxaparin (LOVENOX) injection  40 mg Subcutaneous Q24H   ezetimibe  10 mg Oral Daily   insulin aspart  0-15 Units Subcutaneous TID WC & HS   metoprolol tartrate  50 mg Oral BID   tiZANidine  4 mg Oral Q6H   Continuous Infusions:  cefTRIAXone (ROCEPHIN)  IV Stopped (11/27/21 1421)   lactated ringers 50 mL/hr at 11/27/21 2352     LOS: 2 days      Phillips Climes, MD [facility]   To contact the attending provider between 7A-7P or the covering provider during after hours 7P-7A, please log into the web site [email or web address] and access using universal [place] password for that web site. If you do not have the password, please call the hospital operator.  11/28/2021, 10:56 AM

## 2021-11-29 LAB — URINE CULTURE: Culture: >=100000 — AB

## 2021-11-29 LAB — GLUCOSE, CAPILLARY
Glucose-Capillary: 134 mg/dL — ABNORMAL HIGH (ref 70–99)
Glucose-Capillary: 115 mg/dL — ABNORMAL HIGH (ref 70–99)
Glucose-Capillary: 140 mg/dL — ABNORMAL HIGH (ref 70–99)
Glucose-Capillary: 111 mg/dL — ABNORMAL HIGH (ref 70–99)

## 2021-11-29 LAB — CULTURE, BLOOD (ROUTINE X 2)
Special Requests: ADEQUATE
Special Requests: ADEQUATE

## 2021-11-29 MED ORDER — SODIUM CHLORIDE 0.9 % IV SOLN
2.0000 g | INTRAVENOUS | Status: AC
  Administered 2021-11-29: 2 g via INTRAVENOUS
  Filled 2021-11-29: qty 20

## 2021-11-29 MED ORDER — SODIUM CHLORIDE 0.9 % IV SOLN
2.0000 g | Freq: Once | INTRAVENOUS | Status: AC
  Administered 2021-11-30: 2 g via INTRAVENOUS
  Filled 2021-11-29: qty 20

## 2021-11-29 MED ORDER — METOPROLOL TARTRATE 50 MG PO TABS
50.0000 mg | ORAL_TABLET | Freq: Two times a day (BID) | ORAL | Status: DC
  Administered 2021-11-30: 50 mg via ORAL
  Filled 2021-11-29: qty 1

## 2021-11-29 NOTE — Plan of Care (Signed)
°  Problem: Nutrition: Goal: Adequate nutrition will be maintained Outcome: Progressing   Problem: Coping: Goal: Level of anxiety will decrease Outcome: Progressing   Problem: Elimination: Goal: Will not experience complications related to urinary retention Outcome: Progressing   Problem: Nutrition: Goal: Adequate nutrition will be maintained Outcome: Progressing   Problem: Coping: Goal: Level of anxiety will decrease Outcome: Progressing   Problem: Elimination: Goal: Will not experience complications related to urinary retention Outcome: Progressing

## 2021-11-29 NOTE — Progress Notes (Signed)
Sepsis present on admission due to pyelonephritis PROGRESS NOTE    Shelby Robertson  GDJ:[id number] DOB: [birth year] DOA: 11/26/2021 PCP: Minette Brine, FNP   Chief Complaint  Patient presents with   Chills   Back Pain    Brief Narrative:    Shelby Robertson is a 51 y.o. female with medical history significant of HTN, DMT2, RA, Fibromyalgia who presents for evaluation of fever, low back pain, urinary frequency and dysuria. She reports that today she developed fever and chills and had decreased appetite. / Work-up was significant for sepsis due to E. coli bacteremia s hemorrhagic cystitis / and pyelonephritis .  Assessment & Plan:   Principal Problem:   Pyelonephritis Active Problems:   HYPERTENSION, BENIGN ESSENTIAL   Rheumatoid arthritis of multiple sites without rheumatoid factor (HCC)   Tobacco use disorder, continuous   Controlled type 2 diabetes mellitus with complication, without long-term current use of insulin (HCC)   Hematuria   Tachycardia   Sepsis due to Escherichia coli (E. coli) ([place])   Sepsis due to to E. Coli/pyelonephritis  and bacteremia (present on admission) -Sepsis present on admission -Blood culture growing gram-negative rods, urine culture growing E. coli, sensitivities pending, for now continue with IV Rocephin 2 g IV daily.   -Patient with right CVA tenderness with clinical concern of right acute pyelonephritis.  This appears to be improving, CVA tenderness has resolved. -Continue with IV fluids -Leukocytosis count trending down   HYPERTENSION, BENIGN ESSENTIAL- (present on admission) - on norvasc, HCTZ and metoprolol.  Blood pressure is soft, continue to hold hydrochlorothiazide, I will discontinue amlodipine, continue with metoprolol for now.   -Will increase IV fluids to 75 cc/h.  Controlled type 2 diabetes mellitus with complication, without long-term current use of insulin ([place])- (present on admission) We will hold metformin, continue with insulin  sliding scale, -controlled with A1c of 6.1   Rheumatoid arthritis of multiple sites without rheumatoid factor ([place])- (present on admission) stable   Hematuria This is due to her infection.  And hemorrhagic cystitis    Tobacco use disorder, continuous- (present on admission) Nicotine patch provided to prevent withdrawal. Advised to quit tobacco use and health hazards reviewed       DVT prophylaxis: Lovenox Code Status: Full Family Communication: None at bedside Disposition:   Status is: Inpatient Remains inpatient appropriate because: bacteremia           Consultants:  None   Subjective:  Right flank pain has resolved, no fever, no chills, reports she is feeling better.   Objective: Vitals:   11/28/21 1627 11/28/21 2129 11/29/21 0053 11/29/21 0940  BP: 117/62 120/72 107/69 101/68  Pulse: (!) 102 96 87 98  Resp: 18 18  18   Temp: 98.5 F (36.9 C) 98.3 F (36.8 C) 98.2 F (36.8 C) 98.7 F (37.1 C)  TempSrc:  Oral Oral Oral  SpO2: 94% 96% 97% 98%  Weight:      Height:        Intake/Output Summary (Last 24 hours) at 11/29/2021 1107 Last data filed at 11/29/2021 6222 Gross per 24 hour  Intake 1074.9 ml  Output --  Net 1074.9 ml   Filed Weights   11/26/21 2059  Weight: 124.7 kg    Examination:  Awake Alert, Oriented X 3, No new F.N deficits, Normal affect Symmetrical Chest wall movement, Good air movement bilaterally, CTAB RRR,No Gallops,Rubs or new Murmurs, No Parasternal Heave +ve B.Sounds, Abd Soft, No tenderness, No rebound - guarding or rigidity. No Cyanosis, Clubbing or  edema, No new Rash or bruise        Data Reviewed: I have personally reviewed following labs and imaging studies  CBC: Recent Labs  Lab 11/26/21 2134 11/27/21 0343 11/28/21 0049  WBC 15.0* 20.6* 17.8*  NEUTROABS 12.8*  --   --   HGB 15.1* 13.2 12.5  HCT 44.4 39.2 36.4  MCV 88.6 88.3 87.9  PLT 378 361 725    Basic Metabolic Panel: Recent Labs  Lab  11/26/21 2134 11/27/21 0343 11/28/21 0049  NA 135 134* 135  K 3.6 3.5 3.6  CL 99 101 99  CO2 24 22 26   GLUCOSE 141* 169* 124*  BUN 5* 6 7  CREATININE 0.66 0.89 0.76  CALCIUM 9.7 8.8* 8.8*    GFR: Estimated Creatinine Clearance: 114.8 mL/min (by C-G formula based on SCr of 0.76 mg/dL).  Liver Function Tests: Recent Labs  Lab 11/26/21 2134  AST 15  ALT 15  ALKPHOS 90  BILITOT 0.6  PROT 9.0*  ALBUMIN 4.1    CBG: Recent Labs  Lab 11/28/21 0753 11/28/21 1125 11/28/21 1627 11/28/21 2131 11/29/21 0718  GLUCAP 121* 136* 136* 134* 134*     Recent Results (from the past 240 hour(s))  Urine Culture     Status: Abnormal   Collection Time: 11/26/21  8:59 PM   Specimen: In/Out Cath Urine  Result Value Ref Range Status   Specimen Description IN/OUT CATH URINE  Final   Special Requests   Final    NONE Performed at [facility], [place] [address], [place], [place] [address]    Culture >=100,000 COLONIES/mL ESCHERICHIA COLI (A)  Final   Report Status 11/29/2021 FINAL  Final   Organism ID, Bacteria ESCHERICHIA COLI (A)  Final      Susceptibility   Escherichia coli - MIC*    AMPICILLIN 8 SENSITIVE Sensitive     CEFAZOLIN <=4 SENSITIVE Sensitive     CEFEPIME <=0.12 SENSITIVE Sensitive     CEFTRIAXONE <=0.25 SENSITIVE Sensitive     CIPROFLOXACIN <=0.25 SENSITIVE Sensitive     GENTAMICIN <=1 SENSITIVE Sensitive     IMIPENEM <=0.25 SENSITIVE Sensitive     NITROFURANTOIN <=16 SENSITIVE Sensitive     TRIMETH/SULFA <=20 SENSITIVE Sensitive     AMPICILLIN/SULBACTAM <=2 SENSITIVE Sensitive     PIP/TAZO <=4 SENSITIVE Sensitive     * >=100,000 COLONIES/mL ESCHERICHIA COLI  Blood Culture (routine x 2)     Status: Abnormal   Collection Time: 11/26/21  9:34 PM   Specimen: BLOOD LEFT ARM  Result Value Ref Range Status   Specimen Description BLOOD LEFT ARM  Final   Special Requests   Final    BOTTLES DRAWN AEROBIC AND ANAEROBIC Blood Culture adequate volume   Culture  Setup  Time   Final    GRAM NEGATIVE RODS IN BOTH AEROBIC AND ANAEROBIC BOTTLES CRITICAL RESULT CALLED TO, READ BACK BY AND VERIFIED WITH: PHARMD E.MARTIN AT 0347 ON 11/27/2021 BY T.SAAD. Performed at [facility], [place] [address] [place], [place] [address]    Culture ESCHERICHIA COLI (A)  Final   Report Status 11/29/2021 FINAL  Final   Organism ID, Bacteria ESCHERICHIA COLI  Final      Susceptibility   Escherichia coli - MIC*    AMPICILLIN 8 SENSITIVE Sensitive     CEFAZOLIN <=4 SENSITIVE Sensitive     CEFEPIME <=0.12 SENSITIVE Sensitive     CEFTAZIDIME <=1 SENSITIVE Sensitive     CEFTRIAXONE <=0.25 SENSITIVE Sensitive  CIPROFLOXACIN <=0.25 SENSITIVE Sensitive     GENTAMICIN <=1 SENSITIVE Sensitive     IMIPENEM <=0.25 SENSITIVE Sensitive     TRIMETH/SULFA <=20 SENSITIVE Sensitive     AMPICILLIN/SULBACTAM <=2 SENSITIVE Sensitive     PIP/TAZO <=4 SENSITIVE Sensitive     * ESCHERICHIA COLI  Blood Culture (routine x 2)     Status: Abnormal   Collection Time: 11/26/21  9:34 PM   Specimen: BLOOD RIGHT ARM  Result Value Ref Range Status   Specimen Description BLOOD RIGHT ARM  Final   Special Requests AEROBIC BOTTLE ONLY Blood Culture adequate volume  Final   Culture  Setup Time   Final    GRAM NEGATIVE RODS AEROBIC BOTTLE ONLY CRITICAL VALUE NOTED.  VALUE IS CONSISTENT WITH PREVIOUSLY REPORTED AND CALLED VALUE.    Culture (A)  Final    ESCHERICHIA COLI SUSCEPTIBILITIES PERFORMED ON PREVIOUS CULTURE WITHIN THE LAST 5 DAYS. Performed at [facility], [place] [address], [place], [place] [address]    Report Status 11/29/2021 FINAL  Final  Blood Culture ID Panel (Reflexed)     Status: Abnormal   Collection Time: 11/26/21  9:34 PM  Result Value Ref Range Status   Enterococcus faecalis NOT DETECTED NOT DETECTED Final   Enterococcus Faecium NOT DETECTED NOT DETECTED Final   Listeria monocytogenes NOT DETECTED NOT DETECTED Final   Staphylococcus species NOT DETECTED NOT DETECTED  Final   Staphylococcus aureus (BCID) NOT DETECTED NOT DETECTED Final   Staphylococcus epidermidis NOT DETECTED NOT DETECTED Final   Staphylococcus lugdunensis NOT DETECTED NOT DETECTED Final   Streptococcus species NOT DETECTED NOT DETECTED Final   Streptococcus agalactiae NOT DETECTED NOT DETECTED Final   Streptococcus pneumoniae NOT DETECTED NOT DETECTED Final   Streptococcus pyogenes NOT DETECTED NOT DETECTED Final   A.calcoaceticus-baumannii NOT DETECTED NOT DETECTED Final   Bacteroides fragilis NOT DETECTED NOT DETECTED Final   Enterobacterales DETECTED (A) NOT DETECTED Final    Comment: Enterobacterales represent a large order of gram negative bacteria, not a single organism. CRITICAL RESULT CALLED TO, READ BACK BY AND VERIFIED WITH: PHARMD E.MARTIN AT 7893 ON 11/27/2021 BY T.SAAD.    Enterobacter cloacae complex NOT DETECTED NOT DETECTED Final   Escherichia coli DETECTED (A) NOT DETECTED Final    Comment: CRITICAL RESULT CALLED TO, READ BACK BY AND VERIFIED WITH: PHARMD E.MARTIN AT 8101 ON 11/27/2021 BY T.SAAD.    Klebsiella aerogenes NOT DETECTED NOT DETECTED Final   Klebsiella oxytoca NOT DETECTED NOT DETECTED Final   Klebsiella pneumoniae NOT DETECTED NOT DETECTED Final   Proteus species NOT DETECTED NOT DETECTED Final   Salmonella species NOT DETECTED NOT DETECTED Final   Serratia marcescens NOT DETECTED NOT DETECTED Final   Haemophilus influenzae NOT DETECTED NOT DETECTED Final   Neisseria meningitidis NOT DETECTED NOT DETECTED Final   Pseudomonas aeruginosa NOT DETECTED NOT DETECTED Final   Stenotrophomonas maltophilia NOT DETECTED NOT DETECTED Final   Candida albicans NOT DETECTED NOT DETECTED Final   Candida auris NOT DETECTED NOT DETECTED Final   Candida glabrata NOT DETECTED NOT DETECTED Final   Candida krusei NOT DETECTED NOT DETECTED Final   Candida parapsilosis NOT DETECTED NOT DETECTED Final   Candida tropicalis NOT DETECTED NOT DETECTED Final   Cryptococcus  neoformans/gattii NOT DETECTED NOT DETECTED Final   CTX-M ESBL NOT DETECTED NOT DETECTED Final   Carbapenem resistance IMP NOT DETECTED NOT DETECTED Final   Carbapenem resistance KPC NOT DETECTED NOT DETECTED Final   Carbapenem resistance NDM NOT DETECTED  NOT DETECTED Final   Carbapenem resist OXA 48 LIKE NOT DETECTED NOT DETECTED Final   Carbapenem resistance VIM NOT DETECTED NOT DETECTED Final    Comment: Performed at [facility], [place] [address], [place], [place] [address]  Resp Panel by RT-PCR (Flu A&B, Covid) Nasopharyngeal Swab     Status: None   Collection Time: 11/27/21  3:07 PM   Specimen: Nasopharyngeal Swab; Nasopharyngeal(NP) swabs in vial transport medium  Result Value Ref Range Status   SARS Coronavirus 2 by RT PCR NEGATIVE NEGATIVE Final    Comment: (NOTE) SARS-CoV-2 target nucleic acids are NOT DETECTED.  The SARS-CoV-2 RNA is generally detectable in upper respiratory specimens during the acute phase of infection. The lowest concentration of SARS-CoV-2 viral copies this assay can detect is 138 copies/mL. A negative result does not preclude SARS-Cov-2 infection and should not be used as the sole basis for treatment or other patient management decisions. A negative result may occur with  improper specimen collection/handling, submission of specimen other than nasopharyngeal swab, presence of viral mutation(s) within the areas targeted by this assay, and inadequate number of viral copies(<138 copies/mL). A negative result must be combined with clinical observations, patient history, and epidemiological information. The expected result is Negative.  Fact Sheet for Patients:  EntrepreneurPulse.com.au  Fact Sheet for Healthcare Providers:  IncredibleEmployment.be  This test is no t yet approved or cleared by the [place] FDA and  has been authorized for detection and/or diagnosis of SARS-CoV-2 by FDA under an Emergency Use  Authorization (EUA). This EUA will remain  in effect (meaning this test can be used) for the duration of the COVID-19 declaration under Section 564(b)(1) of the Act, 21 U.S.C.section 360bbb-3(b)(1), unless the authorization is terminated  or revoked sooner.       Influenza A by PCR NEGATIVE NEGATIVE Final   Influenza B by PCR NEGATIVE NEGATIVE Final    Comment: (NOTE) The Xpert Xpress SARS-CoV-2/FLU/RSV plus assay is intended as an aid in the diagnosis of influenza from Nasopharyngeal swab specimens and should not be used as a sole basis for treatment. Nasal washings and aspirates are unacceptable for Xpert Xpress SARS-CoV-2/FLU/RSV testing.  Fact Sheet for Patients: EntrepreneurPulse.com.au  Fact Sheet for Healthcare Providers: IncredibleEmployment.be  This test is not yet approved or cleared by the [place] FDA and has been authorized for detection and/or diagnosis of SARS-CoV-2 by FDA under an Emergency Use Authorization (EUA). This EUA will remain in effect (meaning this test can be used) for the duration of the COVID-19 declaration under Section 564(b)(1) of the Act, 21 U.S.C. section 360bbb-3(b)(1), unless the authorization is terminated or revoked.  Performed at [facility], [place] [address], [place], [place] [address]          Radiology Studies: No results found.      Scheduled Meds:  amLODipine  10 mg Oral Daily   atorvastatin  20 mg Oral Daily   DULoxetine  60 mg Oral Daily   enoxaparin (LOVENOX) injection  40 mg Subcutaneous Q24H   ezetimibe  10 mg Oral Daily   insulin aspart  0-15 Units Subcutaneous TID WC & HS   metoprolol tartrate  50 mg Oral BID   tiZANidine  4 mg Oral Q6H   Continuous Infusions:  cefTRIAXone (ROCEPHIN)  IV     [START ON 11/30/2021] cefTRIAXone (ROCEPHIN)  IV     lactated ringers Stopped (11/29/21 0050)     LOS: 3 days      Emeline Gins  Kodee Ravert, MD [facility]   To  contact the attending provider between 7A-7P or the covering provider during after hours 7P-7A, please log into the web site [email or web address] and access using universal [place] password for that web site. If you do not have the password, please call the hospital operator.  11/29/2021, 11:07 AM

## 2021-11-30 DIAGNOSIS — R319 Hematuria, unspecified: Secondary | ICD-10-CM

## 2021-11-30 LAB — BASIC METABOLIC PANEL
Anion gap: 8 (ref 5–15)
BUN: 11 mg/dL (ref 6–20)
CO2: 27 mmol/L (ref 22–32)
Calcium: 8.9 mg/dL (ref 8.9–10.3)
Chloride: 100 mmol/L (ref 98–111)
Creatinine, Ser: 0.66 mg/dL (ref 0.44–1.00)
GFR, Estimated: >60 mL/min (ref >60)
Glucose, Bld: 118 mg/dL — ABNORMAL HIGH (ref 70–99)
Potassium: 3.7 mmol/L (ref 3.5–5.1)
Sodium: 135 mmol/L (ref 135–145)

## 2021-11-30 LAB — CBC
HCT: 34.9 % — ABNORMAL LOW (ref 36.0–46.0)
Hemoglobin: 11.9 g/dL — ABNORMAL LOW (ref 12.0–15.0)
MCH: 29.6 pg (ref 26.0–34.0)
MCHC: 34.1 g/dL (ref 30.0–36.0)
MCV: 86.8 fL (ref 80.0–100.0)
Platelets: 298 10*3/uL (ref 150–400)
RBC: 4.02 MIL/uL (ref 3.87–5.11)
RDW: 13.4 % (ref 11.5–15.5)
WBC: 9.8 10*3/uL (ref 4.0–10.5)
nRBC: 0 % (ref 0.0–0.2)

## 2021-11-30 LAB — GLUCOSE, CAPILLARY: Glucose-Capillary: 119 mg/dL — ABNORMAL HIGH (ref 70–99)

## 2021-11-30 MED ORDER — AMLODIPINE BESYLATE 10 MG PO TABS: 10.0000 mg | ORAL_TABLET | Freq: Every day | ORAL | Status: DC

## 2021-11-30 MED ORDER — HYDROCHLOROTHIAZIDE 12.5 MG PO TABS: 12.5000 mg | ORAL_TABLET | Freq: Every day | ORAL | 1 refills | Status: DC | PRN

## 2021-11-30 MED ORDER — ACETAMINOPHEN 325 MG PO TABS
650.0000 mg | ORAL_TABLET | Freq: Four times a day (QID) | ORAL | Status: AC | PRN
Start: 2021-11-30 — End: ?

## 2021-11-30 MED ORDER — CEFADROXIL 1 G PO TABS: 1.0000 g | ORAL_TABLET | Freq: Two times a day (BID) | ORAL | 0 refills | Status: DC

## 2021-11-30 NOTE — Progress Notes (Signed)
°  Transition of Care [facility]) Screening Note   Patient Details  Name: Alondra Vandeven Date of Birth: [birth year]   Transition of [facility]) CM/SW Contact:    Tom-Johnson, Renea Ee, RN Phone Number: 11/30/2021, 2:59 PM  Patient is scheduled for discharge today. From home and states both of her parents live with her. Requested CM call her Pain Clinic for medication refill. CM called [facility] at [phone] and spoke with Virgilio Frees, and Virgilio Frees states that patient was last seen at the Clinic on 10/12/21 and will need to be seen by an MD before her medication could be refilled. Patient notified. Transition of Care Department [facility]) has reviewed patient and no TOC needs have been identified at this time. Patient states she drove herself and will be driving herself back home. Decline transportation offer from CM. No further TOC needs noted.

## 2021-11-30 NOTE — Care Management Important Message (Signed)
Important Message  Patient Details  Name: Shelby Robertson MRN: [id number] Date of Birth: [birth year]   Medicare Important Message Given:  Yes  Patient left prior to IM delivery will mail to the patient home address.    Laparis Durrett 11/30/2021, 3:17 PM

## 2021-11-30 NOTE — Progress Notes (Signed)
Discharge instructions given. Patient verbalized understanding and all questions were answered.  ?

## 2021-11-30 NOTE — Discharge Summary (Signed)
Physician Discharge Summary  [name] VQQ:[id number] DOB: [birth year] DOA: 11/26/2021  PCP: Minette Brine, FNP  Admit date: 11/26/2021 Discharge date: 11/30/2021  Admitted From: Home Disposition:  Home   Recommendations for Outpatient Follow-up:  Follow up with PCP in 1-2 weeks   Home Health:NO   Discharge Condition:Stable CODE STATUS:FULL Diet recommendation: Heart Healthy/carb modified  Brief/Interim Summary:   Shelby Robertson is a 51 y.o. female with medical history significant of HTN, DMT2, RA, Fibromyalgia who presents for evaluation of fever, low back pain, urinary frequency and dysuria. She reports that today she developed fever and chills and had decreased appetite. / Work-up was significant for sepsis due to E. coli bacteremia , hemorrhagic cystitis and pyelonephritis .  Discharge Diagnoses:  Principal Problem:   Pyelonephritis Active Problems:   HYPERTENSION, BENIGN ESSENTIAL   Rheumatoid arthritis of multiple sites without rheumatoid factor (HCC)   Tobacco use disorder, continuous   Controlled type 2 diabetes mellitus with complication, without long-term current use of insulin (HCC)   Hematuria   Tachycardia   Sepsis due to Escherichia coli (E. coli) ([place])    Sepsis due to to E. Coli/pyelonephritis  and bacteremia (present on admission) -Sepsis present on admission, resolved on discharge -Culture and urine culture growing pansensitive E. coli, she was treated with total of 4 days of IV Rocephin during hospital stay, to finish another 6 days of oral cefadroxil as an outpatient for total of 10 days treatment.   -Leukocytosis has resolved, no CVA tenderness on discharge.     HYPERTENSION, BENIGN ESSENTIAL- (present on admission) - on norvasc, HCTZ and metoprolol.  Blood pressure has been initially soft so her meds has been held, started to increase, so she will be discharged on her home dose metoprolol, and resume metoprolol in 2 days, and hydrochlorothiazide in 4  days.     Controlled type 2 diabetes mellitus with complication, without long-term current use of insulin ([place])- (present on admission) W on insulin sliding scale during hospital stay, to resume metformin on discharge.  e will hold metformin, continue with insulin sliding scale, -controlled with A1c of 6.1   Rheumatoid arthritis of multiple sites without rheumatoid factor ([place])- (present on admission) stable   Hematuria This is due to her infection.  [place] to hemorrhagic cystitis, resolved time of discharge   Tobacco use disorder, continuous- (present on admission) Nicotine patch provided to prevent withdrawal. Advised to quit tobacco use and health hazards reviewed      Discharge Instructions  Discharge Instructions     Diet - low sodium heart healthy   Complete by: As directed    Discharge instructions   Complete by: As directed    Follow with Primary MD Minette Brine, FNP in 7 days   Get CBC, CMP, checked  by Primary MD next visit.    Activity: As tolerated with Full fall precautions use walker/cane & assistance as needed   Disposition Home    Diet: Heart Healthy  .  On your next visit with your primary care physician please Get Medicines reviewed and adjusted.   Please request your Prim.MD to go over all Hospital Tests and Procedure/Radiological results at the follow up, please get all Hospital records sent to your Prim MD by signing hospital release before you go home.   If you experience worsening of your admission symptoms, develop shortness of breath, life threatening emergency, suicidal or homicidal thoughts you must seek medical attention immediately by calling 911 or calling your MD immediately  if  symptoms less severe.  You Must read complete instructions/literature along with all the possible adverse reactions/side effects for all the Medicines you take and that have been prescribed to you. Take any new Medicines after you have completely understood and  accpet all the possible adverse reactions/side effects.   Do not drive, operating heavy machinery, perform activities at heights, swimming or participation in water activities or provide baby sitting services if your were admitted for syncope or siezures until you have seen by Primary MD or a Neurologist and advised to do so again.  Do not drive when taking Pain medications.    Do not take more than prescribed Pain, Sleep and Anxiety Medications  Special Instructions: If you have smoked or chewed Tobacco  in the last 2 yrs please stop smoking, stop any regular Alcohol  and or any Recreational drug use.  Wear Seat belts while driving.   Please note  You were cared for by a hospitalist during your hospital stay. If you have any questions about your discharge medications or the care you received while you were in the hospital after you are discharged, you can call the unit and asked to speak with the hospitalist on call if the hospitalist that took care of you is not available. Once you are discharged, your primary care physician will handle any further medical issues. Please note that NO REFILLS for any discharge medications will be authorized once you are discharged, as it is imperative that you return to your primary care physician (or establish a relationship with a primary care physician if you do not have one) for your aftercare needs so that they can reassess your need for medications and monitor your lab values.   Increase activity slowly   Complete by: As directed       Allergies as of 11/30/2021   No Known Allergies      Medication List     STOP taking these medications    acetaminophen 650 MG CR tablet Commonly known as: TYLENOL Replaced by: acetaminophen 325 MG tablet       TAKE these medications    acetaminophen 325 MG tablet Commonly known as: TYLENOL Take 2 tablets (650 mg total) by mouth every 6 (six) hours as needed for mild pain (or Fever >/= 101). Replaces:  acetaminophen 650 MG CR tablet   albuterol 108 (90 Base) MCG/ACT inhaler Commonly known as: VENTOLIN HFA Inhale 2 puffs into the lungs every 4 (four) hours as needed for wheezing or shortness of breath (cough, shortness of breath or wheezing.).   albuterol (2.5 MG/3ML) 0.083% nebulizer solution Commonly known as: PROVENTIL Take 3 mLs (2.5 mg total) by nebulization every 6 (six) hours as needed for wheezing or shortness of breath.   amLODipine 10 MG tablet Commonly known as: NORVASC Take 1 tablet (10 mg total) by mouth daily for 2 days.   aspirin 325 MG tablet Take 325 mg by mouth every 6 (six) hours as needed (pain).   atorvastatin 20 MG tablet Commonly known as: LIPITOR TAKE 1 TABLET BY MOUTH EVERY DAY   B-D TB SYRINGE 1CC/27GX1/2" 27G X 1/2" 1 ML Misc Generic drug: TUBERCULIN SYR 1CC/27GX1/2" USE 1 SYRINGE TO INJECT METHOTREXATE INTO THE SKIN ONCE A WEEK.   blood glucose meter kit and supplies Kit Dispense based on patient and insurance preference. Use up to two times daily as directed. (FOR ICD-9 250.00, 250.01).   cefadroxil 1 g tablet Commonly known as: DURICEF Take 1 tablet (1 g total) by  mouth 2 (two) times daily. Start taking on: December 01, 2021   diphenhydrAMINE 25 MG tablet Commonly known as: BENADRYL Take 25 mg by mouth every 6 (six) hours as needed for itching, allergies or sleep.   DULoxetine 60 MG capsule Commonly known as: CYMBALTA Take 60 mg by mouth daily.   ezetimibe 10 MG tablet Commonly known as: ZETIA TAKE 1 TABLET BY MOUTH EVERY DAY   Humira Pen 40 MG/0.8ML Pnkt Generic drug: Adalimumab INJECT 1 PEN INTO THE SKIN EVERY 14 DAYS What changed:  how much to take how to take this when to take this   hydrochlorothiazide 12.5 MG tablet Commonly known as: HYDRODIURIL Take 1 tablet (12.5 mg total) by mouth daily as needed (fluid/edema). Start taking on: December 05, 2021 What changed: These instructions start on December 05, 2021. If you are  unsure what to do until then, ask your doctor or other care provider.   HYDROcodone-acetaminophen 10-325 MG tablet Commonly known as: NORCO Take 1 tablet by mouth every 6 (six) hours as needed for moderate pain.   hydrOXYzine 25 MG tablet Commonly known as: ATARAX Take 1 tablet (25 mg total) by mouth every 8 (eight) hours as needed for itching.   metFORMIN 500 MG tablet Commonly known as: GLUCOPHAGE TAKE 1 TABLET BY MOUTH 2 TIMES DAILY WITH A MEAL.   methotrexate 50 MG/2ML injection Inject 0.8 mls (20 mg total) into the skin weekly.   metoprolol tartrate 50 MG tablet Commonly known as: LOPRESSOR TAKE 1 TABLET (50 MG TOTAL) BY MOUTH 2 (TWO) TIMES DAILY. PLEASE SCHEDULE APPOINTMENT FOR REFILLS.   OneTouch Delica Plus [id number] Misc 2 (two) times daily. use for testing   OneTouch Verio test strip Generic drug: glucose blood TEST TWICE DAILY   tiZANidine 4 MG capsule Commonly known as: ZANAFLEX Take 4 mg by mouth every 6 (six) hours.   triamcinolone ointment 0.1 % Commonly known as: KENALOG Apply 1 application topically daily as needed (rash, itching).   Vitamin D (Ergocalciferol) 1.25 MG (50000 UNIT) Caps capsule Commonly known as: DRISDOL TAKE 1 CAPSULE BY MOUTH ON TUES AND ONE ON FRIDAYS. What changed: See the new instructions.        Follow-up Information     Minette Brine, FNP Follow up in 1 week(s).   Specialty: General Practice Contact information: [address] [place] [address] [phone]                No Known Allergies  Consultations: None   Procedures/Studies: DG Chest Port 1 View  Result Date: 11/26/2021 CLINICAL DATA:  Chills, back pain, sepsis EXAM: PORTABLE CHEST 1 VIEW COMPARISON:  09/16/2019 FINDINGS: Increased interstitial markings without frank interstitial edema. Mild bibasilar atelectasis. No pleural effusion or pneumothorax. The heart is normal in size. Chronic eventration of the right hemidiaphragm.  IMPRESSION: Chronic eventration of the right hemidiaphragm with mild bibasilar atelectasis. No frank interstitial edema. Electronically Signed   By: Julian Hy M.D.   On: 11/26/2021 21:42   CT Renal Stone Study  Result Date: 11/26/2021 CLINICAL DATA:  Low back pain, chills, recent UTI EXAM: CT ABDOMEN AND PELVIS WITHOUT CONTRAST TECHNIQUE: Multidetector CT imaging of the abdomen and pelvis was performed following the standard protocol without IV contrast. RADIATION DOSE REDUCTION: This exam was performed according to the departmental dose-optimization program which includes automated exposure control, adjustment of the mA and/or kV according to patient size and/or use of iterative reconstruction technique. COMPARISON:  None. FINDINGS: Lower chest: Lung bases are  clear. Eventration of the right hemidiaphragm. Hepatobiliary: Unenhanced liver is unremarkable. Numerous gallstones (series 3/image 36), without associated inflammatory changes. No intrahepatic or extrahepatic ductal dilatation. Pancreas: Within normal limits. Spleen: Within normal limits. Adrenals/Urinary Tract: Adrenal glands are within normal limits. Bilateral renal cysts, poorly evaluated on unenhanced CT, measuring up to 4.2 cm in the posterior right lower kidney (series 3/image 46). Multiple nonobstructing bilateral lower pole renal calculi, measuring up to 12 mm in the right lower pole (series 3/image 47). No hydronephrosis. Thick-walled bladder, although underdistended. Stomach/Bowel: Stomach is within normal limits. No evidence of bowel obstruction. Appendix is not discretely visualized. No colonic wall thickening or inflammatory changes. Vascular/Lymphatic: No evidence of abdominal aortic aneurysm. Atherosclerotic calcifications of the abdominal aorta and branch vessels. No suspicious abdominopelvic lymphadenopathy. Reproductive: Uterus and bilateral ovaries are within normal limits. Other: No abdominopelvic ascites. Musculoskeletal: Mild  degenerative changes of the lower thoracic spine. IMPRESSION: Mildly thick-walled bladder, correlate for cystitis. Multiple nonobstructing bilateral renal calculi, measuring up to 12 mm in the right lower pole. Bilateral renal cysts, poorly evaluated on unenhanced CT, measuring up to 4.2 cm in the right lower kidney. Cholelithiasis, without associated inflammatory changes. Electronically Signed   By: Julian Hy M.D.   On: 11/26/2021 23:01      Subjective:  She denies any complaints today, no fever, no chills, no back pain. Discharge Exam: Vitals:   11/29/21 2050 11/30/21 0819  BP: (!) 128/92 130/77  Pulse: 95 (!) 106  Resp: 18 18  Temp: 97.9 F (36.6 C) 99.6 F (37.6 C)  SpO2: 96% 100%   Vitals:   11/29/21 0940 11/29/21 1623 11/29/21 2050 11/30/21 0819  BP: 101/68 107/72 (!) 128/92 130/77  Pulse: 98 92 95 (!) 106  Resp: _0 Temp: 98.7 F (37.1 C) 98.4 F (36.9 C) 97.9 F (36.6 C) 99.6 F (37.6 C)  TempSrc: Oral  Oral Oral  SpO2: 98% 96% 96% 100%  Weight:      Height:        General: Pt is alert, awake, not in acute distress Cardiovascular: RRR, S1/S2 +, no rubs, no gallops Respiratory: CTA bilaterally, no wheezing, no rhonchi Abdominal: Soft, NT, ND, bowel sounds + Extremities: no edema, no cyanosis    The results of significant diagnostics from this hospitalization (including imaging, microbiology, ancillary and laboratory) are listed below for reference.     Microbiology: Recent Results (from the past 240 hour(s))  Urine Culture     Status: Abnormal   Collection Time: 11/26/21  8:59 PM   Specimen: In/Out Cath Urine  Result Value Ref Range Status   Specimen Description IN/OUT CATH URINE  Final   Special Requests   Final    NONE Performed at [facility], [address], [place], [place] [address]    Culture >=100,000 COLONIES/mL ESCHERICHIA COLI (A)  Final   Report Status 11/29/2021 FINAL  Final   Organism ID, Bacteria ESCHERICHIA COLI  (A)  Final      Susceptibility   Escherichia coli - MIC*    AMPICILLIN 8 SENSITIVE Sensitive     CEFAZOLIN <=4 SENSITIVE Sensitive     CEFEPIME <=0.12 SENSITIVE Sensitive     CEFTRIAXONE <=0.25 SENSITIVE Sensitive     CIPROFLOXACIN <=0.25 SENSITIVE Sensitive     GENTAMICIN <=1 SENSITIVE Sensitive     IMIPENEM <=0.25 SENSITIVE Sensitive     NITROFURANTOIN <=16 SENSITIVE Sensitive     TRIMETH/SULFA <=20 SENSITIVE Sensitive     AMPICILLIN/SULBACTAM <=2  SENSITIVE Sensitive     PIP/TAZO <=4 SENSITIVE Sensitive     * >=100,000 COLONIES/mL ESCHERICHIA COLI  Blood Culture (routine x 2)     Status: Abnormal   Collection Time: 11/26/21  9:34 PM   Specimen: BLOOD LEFT ARM  Result Value Ref Range Status   Specimen Description BLOOD LEFT ARM  Final   Special Requests   Final    BOTTLES DRAWN AEROBIC AND ANAEROBIC Blood Culture adequate volume   Culture  Setup Time   Final    GRAM NEGATIVE RODS IN BOTH AEROBIC AND ANAEROBIC BOTTLES CRITICAL RESULT CALLED TO, READ BACK BY AND VERIFIED WITH: PHARMD E.MARTIN AT 7124 ON 11/27/2021 BY T.SAAD. Performed at [facility], [place] [address] [place], [place] [address]    Culture ESCHERICHIA COLI (A)  Final   Report Status 11/29/2021 FINAL  Final   Organism ID, Bacteria ESCHERICHIA COLI  Final      Susceptibility   Escherichia coli - MIC*    AMPICILLIN 8 SENSITIVE Sensitive     CEFAZOLIN <=4 SENSITIVE Sensitive     CEFEPIME <=0.12 SENSITIVE Sensitive     CEFTAZIDIME <=1 SENSITIVE Sensitive     CEFTRIAXONE <=0.25 SENSITIVE Sensitive     CIPROFLOXACIN <=0.25 SENSITIVE Sensitive     GENTAMICIN <=1 SENSITIVE Sensitive     IMIPENEM <=0.25 SENSITIVE Sensitive     TRIMETH/SULFA <=20 SENSITIVE Sensitive     AMPICILLIN/SULBACTAM <=2 SENSITIVE Sensitive     PIP/TAZO <=4 SENSITIVE Sensitive     * ESCHERICHIA COLI  Blood Culture (routine x 2)     Status: Abnormal   Collection Time: 11/26/21  9:34 PM   Specimen: BLOOD RIGHT ARM  Result Value Ref Range  Status   Specimen Description BLOOD RIGHT ARM  Final   Special Requests AEROBIC BOTTLE ONLY Blood Culture adequate volume  Final   Culture  Setup Time   Final    GRAM NEGATIVE RODS AEROBIC BOTTLE ONLY CRITICAL VALUE NOTED.  VALUE IS CONSISTENT WITH PREVIOUSLY REPORTED AND CALLED VALUE.    Culture (A)  Final    ESCHERICHIA COLI SUSCEPTIBILITIES PERFORMED ON PREVIOUS CULTURE WITHIN THE LAST 5 DAYS. Performed at [facility], [place] [address], [place], [place] [address]    Report Status 11/29/2021 FINAL  Final  Blood Culture ID Panel (Reflexed)     Status: Abnormal   Collection Time: 11/26/21  9:34 PM  Result Value Ref Range Status   Enterococcus faecalis NOT DETECTED NOT DETECTED Final   Enterococcus Faecium NOT DETECTED NOT DETECTED Final   Listeria monocytogenes NOT DETECTED NOT DETECTED Final   Staphylococcus species NOT DETECTED NOT DETECTED Final   Staphylococcus aureus (BCID) NOT DETECTED NOT DETECTED Final   Staphylococcus epidermidis NOT DETECTED NOT DETECTED Final   Staphylococcus lugdunensis NOT DETECTED NOT DETECTED Final   Streptococcus species NOT DETECTED NOT DETECTED Final   Streptococcus agalactiae NOT DETECTED NOT DETECTED Final   Streptococcus pneumoniae NOT DETECTED NOT DETECTED Final   Streptococcus pyogenes NOT DETECTED NOT DETECTED Final   A.calcoaceticus-baumannii NOT DETECTED NOT DETECTED Final   Bacteroides fragilis NOT DETECTED NOT DETECTED Final   Enterobacterales DETECTED (A) NOT DETECTED Final    Comment: Enterobacterales represent a large order of gram negative bacteria, not a single organism. CRITICAL RESULT CALLED TO, READ BACK BY AND VERIFIED WITH: PHARMD E.MARTIN AT 5053 ON 11/27/2021 BY T.SAAD.    Enterobacter cloacae complex NOT DETECTED NOT DETECTED Final   Escherichia coli DETECTED (A) NOT DETECTED Final  Comment: CRITICAL RESULT CALLED TO, READ BACK BY AND VERIFIED WITH: PHARMD E.MARTIN AT 8250 ON 11/27/2021 BY T.SAAD.    Klebsiella  aerogenes NOT DETECTED NOT DETECTED Final   Klebsiella oxytoca NOT DETECTED NOT DETECTED Final   Klebsiella pneumoniae NOT DETECTED NOT DETECTED Final   Proteus species NOT DETECTED NOT DETECTED Final   Salmonella species NOT DETECTED NOT DETECTED Final   Serratia marcescens NOT DETECTED NOT DETECTED Final   Haemophilus influenzae NOT DETECTED NOT DETECTED Final   Neisseria meningitidis NOT DETECTED NOT DETECTED Final   Pseudomonas aeruginosa NOT DETECTED NOT DETECTED Final   Stenotrophomonas maltophilia NOT DETECTED NOT DETECTED Final   Candida albicans NOT DETECTED NOT DETECTED Final   Candida auris NOT DETECTED NOT DETECTED Final   Candida glabrata NOT DETECTED NOT DETECTED Final   Candida krusei NOT DETECTED NOT DETECTED Final   Candida parapsilosis NOT DETECTED NOT DETECTED Final   Candida tropicalis NOT DETECTED NOT DETECTED Final   Cryptococcus neoformans/gattii NOT DETECTED NOT DETECTED Final   CTX-M ESBL NOT DETECTED NOT DETECTED Final   Carbapenem resistance IMP NOT DETECTED NOT DETECTED Final   Carbapenem resistance KPC NOT DETECTED NOT DETECTED Final   Carbapenem resistance NDM NOT DETECTED NOT DETECTED Final   Carbapenem resist OXA 48 LIKE NOT DETECTED NOT DETECTED Final   Carbapenem resistance VIM NOT DETECTED NOT DETECTED Final    Comment: Performed at [facility], [address] [place], [place] [address]  Resp Panel by RT-PCR (Flu A&B, Covid) Nasopharyngeal Swab     Status: None   Collection Time: 11/27/21  3:07 PM   Specimen: Nasopharyngeal Swab; Nasopharyngeal(NP) swabs in vial transport medium  Result Value Ref Range Status   SARS Coronavirus 2 by RT PCR NEGATIVE NEGATIVE Final    Comment: (NOTE) SARS-CoV-2 target nucleic acids are NOT DETECTED.  The SARS-CoV-2 RNA is generally detectable in upper respiratory specimens during the acute phase of infection. The lowest concentration of SARS-CoV-2 viral copies this assay can detect is 138 copies/mL. A  negative result does not preclude SARS-Cov-2 infection and should not be used as the sole basis for treatment or other patient management decisions. A negative result may occur with  improper specimen collection/handling, submission of specimen other than nasopharyngeal swab, presence of viral mutation(s) within the areas targeted by this assay, and inadequate number of viral copies(<138 copies/mL). A negative result must be combined with clinical observations, patient history, and epidemiological information. The expected result is Negative.  Fact Sheet for Patients:  EntrepreneurPulse.com.au  Fact Sheet for Healthcare Providers:  IncredibleEmployment.be  This test is no t yet approved or cleared by the [place] FDA and  has been authorized for detection and/or diagnosis of SARS-CoV-2 by FDA under an Emergency Use Authorization (EUA). This EUA will remain  in effect (meaning this test can be used) for the duration of the COVID-19 declaration under Section 564(b)(1) of the Act, 21 U.S.C.section 360bbb-3(b)(1), unless the authorization is terminated  or revoked sooner.       Influenza A by PCR NEGATIVE NEGATIVE Final   Influenza B by PCR NEGATIVE NEGATIVE Final    Comment: (NOTE) The Xpert Xpress SARS-CoV-2/FLU/RSV plus assay is intended as an aid in the diagnosis of influenza from Nasopharyngeal swab specimens and should not be used as a sole basis for treatment. Nasal washings and aspirates are unacceptable for Xpert Xpress SARS-CoV-2/FLU/RSV testing.  Fact Sheet for Patients: EntrepreneurPulse.com.au  Fact Sheet for Healthcare Providers: IncredibleEmployment.be  This test is not  yet approved or cleared by the [place] and has been authorized for detection and/or diagnosis of SARS-CoV-2 by FDA under an Emergency Use Authorization (EUA). This EUA will remain in effect (meaning this test can  be used) for the duration of the COVID-19 declaration under Section 564(b)(1) of the Act, 21 U.S.C. section 360bbb-3(b)(1), unless the authorization is terminated or revoked.  Performed at [facility], [place] [address] [place], [place] [address]      Labs: BNP (last 3 results) No results for input(s): BNP in the last 8760 hours. Basic Metabolic Panel: Recent Labs  Lab 11/26/21 2134 11/27/21 0343 11/28/21 0049 11/30/21 0404  NA 135 134* 135 135  K 3.6 3.5 3.6 3.7  CL 99 101 99 100  CO2 _0 GLUCOSE 141* 169* 124* 118*  BUN 5* _1 CREATININE 0.66 0.89 0.76 0.66  CALCIUM 9.7 8.8* 8.8* 8.9   Liver Function Tests: Recent Labs  Lab 11/26/21 2134  AST 15  ALT 15  ALKPHOS 90  BILITOT 0.6  PROT 9.0*  ALBUMIN 4.1   No results for input(s): LIPASE, AMYLASE in the last 168 hours. No results for input(s): AMMONIA in the last 168 hours. CBC: Recent Labs  Lab 11/26/21 2134 11/27/21 0343 11/28/21 0049 11/30/21 0404  WBC 15.0* 20.6* 17.8* 9.8  NEUTROABS 12.8*  --   --   --   HGB 15.1* 13.2 12.5 11.9*  HCT 44.4 39.2 36.4 34.9*  MCV 88.6 88.3 87.9 86.8  PLT 378 361 284 298   Cardiac Enzymes: No results for input(s): CKTOTAL, CKMB, CKMBINDEX, TROPONINI in the last 168 hours. BNP: Invalid input(s): POCBNP CBG: Recent Labs  Lab 11/29/21 0718 11/29/21 1130 11/29/21 1622 11/29/21 2049 11/30/21 0725  GLUCAP 134* 115* 140* 111* 119*   D-Dimer No results for input(s): DDIMER in the last 72 hours. Hgb A1c No results for input(s): HGBA1C in the last 72 hours. Lipid Profile No results for input(s): CHOL, HDL, LDLCALC, TRIG, CHOLHDL, LDLDIRECT in the last 72 hours. Thyroid function studies No results for input(s): TSH, T4TOTAL, T3FREE, THYROIDAB in the last 72 hours.  Invalid input(s): FREET3 Anemia work up No results for input(s): VITAMINB12, FOLATE, FERRITIN, TIBC, IRON, RETICCTPCT in the last 72 hours. Urinalysis    Component Value Date/Time    COLORURINE YELLOW 11/26/2021 2207   APPEARANCEUR CLOUDY (A) 11/26/2021 2207   LABSPEC 1.008 11/26/2021 2207   PHURINE 6.0 11/26/2021 2207   GLUCOSEU NEGATIVE 11/26/2021 2207   HGBUR LARGE (A) 11/26/2021 2207   HGBUR small 05/04/2010 1120   BILIRUBINUR NEGATIVE 11/26/2021 2207   BILIRUBINUR negative 01/16/2020 1601   KETONESUR NEGATIVE 11/26/2021 2207   PROTEINUR NEGATIVE 11/26/2021 2207   UROBILINOGEN 0.2 01/16/2020 1601   UROBILINOGEN 0.2 09/09/2019 1750   NITRITE NEGATIVE 11/26/2021 2207   LEUKOCYTESUR LARGE (A) 11/26/2021 2207   Sepsis Labs Invalid input(s): PROCALCITONIN,  WBC,  LACTICIDVEN Microbiology Recent Results (from the past 240 hour(s))  Urine Culture     Status: Abnormal   Collection Time: 11/26/21  8:59 PM   Specimen: In/Out Cath Urine  Result Value Ref Range Status   Specimen Description IN/OUT CATH URINE  Final   Special Requests   Final    NONE Performed at [facility], [place] [address] [place], [place] [address]    Culture >=100,000 COLONIES/mL ESCHERICHIA COLI (A)  Final   Report Status 11/29/2021 FINAL  Final   Organism ID, Bacteria ESCHERICHIA COLI (A)  Final      Susceptibility   Escherichia coli - MIC*    AMPICILLIN 8 SENSITIVE Sensitive     CEFAZOLIN <=4 SENSITIVE Sensitive     CEFEPIME <=0.12 SENSITIVE Sensitive     CEFTRIAXONE <=0.25 SENSITIVE Sensitive     CIPROFLOXACIN <=0.25 SENSITIVE Sensitive     GENTAMICIN <=1 SENSITIVE Sensitive     IMIPENEM <=0.25 SENSITIVE Sensitive     NITROFURANTOIN <=16 SENSITIVE Sensitive     TRIMETH/SULFA <=20 SENSITIVE Sensitive     AMPICILLIN/SULBACTAM <=2 SENSITIVE Sensitive     PIP/TAZO <=4 SENSITIVE Sensitive     * >=100,000 COLONIES/mL ESCHERICHIA COLI  Blood Culture (routine x 2)     Status: Abnormal   Collection Time: 11/26/21  9:34 PM   Specimen: BLOOD LEFT ARM  Result Value Ref Range Status   Specimen Description BLOOD LEFT ARM  Final   Special Requests   Final    BOTTLES DRAWN AEROBIC AND  ANAEROBIC Blood Culture adequate volume   Culture  Setup Time   Final    GRAM NEGATIVE RODS IN BOTH AEROBIC AND ANAEROBIC BOTTLES CRITICAL RESULT CALLED TO, READ BACK BY AND VERIFIED WITH: PHARMD E.MARTIN AT 8828 ON 11/27/2021 BY T.SAAD. Performed at [facility], [place] [address] [place], [place] [address]    Culture ESCHERICHIA COLI (A)  Final   Report Status 11/29/2021 FINAL  Final   Organism ID, Bacteria ESCHERICHIA COLI  Final      Susceptibility   Escherichia coli - MIC*    AMPICILLIN 8 SENSITIVE Sensitive     CEFAZOLIN <=4 SENSITIVE Sensitive     CEFEPIME <=0.12 SENSITIVE Sensitive     CEFTAZIDIME <=1 SENSITIVE Sensitive     CEFTRIAXONE <=0.25 SENSITIVE Sensitive     CIPROFLOXACIN <=0.25 SENSITIVE Sensitive     GENTAMICIN <=1 SENSITIVE Sensitive     IMIPENEM <=0.25 SENSITIVE Sensitive     TRIMETH/SULFA <=20 SENSITIVE Sensitive     AMPICILLIN/SULBACTAM <=2 SENSITIVE Sensitive     PIP/TAZO <=4 SENSITIVE Sensitive     * ESCHERICHIA COLI  Blood Culture (routine x 2)     Status: Abnormal   Collection Time: 11/26/21  9:34 PM   Specimen: BLOOD RIGHT ARM  Result Value Ref Range Status   Specimen Description BLOOD RIGHT ARM  Final   Special Requests AEROBIC BOTTLE ONLY Blood Culture adequate volume  Final   Culture  Setup Time   Final    GRAM NEGATIVE RODS AEROBIC BOTTLE ONLY CRITICAL VALUE NOTED.  VALUE IS CONSISTENT WITH PREVIOUSLY REPORTED AND CALLED VALUE.    Culture (A)  Final    ESCHERICHIA COLI SUSCEPTIBILITIES PERFORMED ON PREVIOUS CULTURE WITHIN THE LAST 5 DAYS. Performed at [facility], [place] [address], [place], [place] [address]    Report Status 11/29/2021 FINAL  Final  Blood Culture ID Panel (Reflexed)     Status: Abnormal   Collection Time: 11/26/21  9:34 PM  Result Value Ref Range Status   Enterococcus faecalis NOT DETECTED NOT DETECTED Final   Enterococcus Faecium NOT DETECTED NOT DETECTED Final   Listeria monocytogenes NOT DETECTED NOT DETECTED  Final   Staphylococcus species NOT DETECTED NOT DETECTED Final   Staphylococcus aureus (BCID) NOT DETECTED NOT DETECTED Final   Staphylococcus epidermidis NOT DETECTED NOT DETECTED Final   Staphylococcus lugdunensis NOT DETECTED NOT DETECTED Final   Streptococcus species NOT DETECTED NOT DETECTED Final   Streptococcus agalactiae NOT DETECTED NOT DETECTED Final   Streptococcus pneumoniae NOT DETECTED NOT DETECTED  Final   Streptococcus pyogenes NOT DETECTED NOT DETECTED Final   A.calcoaceticus-baumannii NOT DETECTED NOT DETECTED Final   Bacteroides fragilis NOT DETECTED NOT DETECTED Final   Enterobacterales DETECTED (A) NOT DETECTED Final    Comment: Enterobacterales represent a large order of gram negative bacteria, not a single organism. CRITICAL RESULT CALLED TO, READ BACK BY AND VERIFIED WITH: PHARMD E.MARTIN AT 7654 ON 11/27/2021 BY T.SAAD.    Enterobacter cloacae complex NOT DETECTED NOT DETECTED Final   Escherichia coli DETECTED (A) NOT DETECTED Final    Comment: CRITICAL RESULT CALLED TO, READ BACK BY AND VERIFIED WITH: PHARMD E.MARTIN AT 6503 ON 11/27/2021 BY T.SAAD.    Klebsiella aerogenes NOT DETECTED NOT DETECTED Final   Klebsiella oxytoca NOT DETECTED NOT DETECTED Final   Klebsiella pneumoniae NOT DETECTED NOT DETECTED Final   Proteus species NOT DETECTED NOT DETECTED Final   Salmonella species NOT DETECTED NOT DETECTED Final   Serratia marcescens NOT DETECTED NOT DETECTED Final   Haemophilus influenzae NOT DETECTED NOT DETECTED Final   Neisseria meningitidis NOT DETECTED NOT DETECTED Final   Pseudomonas aeruginosa NOT DETECTED NOT DETECTED Final   Stenotrophomonas maltophilia NOT DETECTED NOT DETECTED Final   Candida albicans NOT DETECTED NOT DETECTED Final   Candida auris NOT DETECTED NOT DETECTED Final   Candida glabrata NOT DETECTED NOT DETECTED Final   Candida krusei NOT DETECTED NOT DETECTED Final   Candida parapsilosis NOT DETECTED NOT DETECTED Final   Candida  tropicalis NOT DETECTED NOT DETECTED Final   Cryptococcus neoformans/gattii NOT DETECTED NOT DETECTED Final   CTX-M ESBL NOT DETECTED NOT DETECTED Final   Carbapenem resistance IMP NOT DETECTED NOT DETECTED Final   Carbapenem resistance KPC NOT DETECTED NOT DETECTED Final   Carbapenem resistance NDM NOT DETECTED NOT DETECTED Final   Carbapenem resist OXA 48 LIKE NOT DETECTED NOT DETECTED Final   Carbapenem resistance VIM NOT DETECTED NOT DETECTED Final    Comment: Performed at [facility], [address] [place], [place] [address]  Resp Panel by RT-PCR (Flu A&B, Covid) Nasopharyngeal Swab     Status: None   Collection Time: 11/27/21  3:07 PM   Specimen: Nasopharyngeal Swab; Nasopharyngeal(NP) swabs in vial transport medium  Result Value Ref Range Status   SARS Coronavirus 2 by RT PCR NEGATIVE NEGATIVE Final    Comment: (NOTE) SARS-CoV-2 target nucleic acids are NOT DETECTED.  The SARS-CoV-2 RNA is generally detectable in upper respiratory specimens during the acute phase of infection. The lowest concentration of SARS-CoV-2 viral copies this assay can detect is 138 copies/mL. A negative result does not preclude SARS-Cov-2 infection and should not be used as the sole basis for treatment or other patient management decisions. A negative result may occur with  improper specimen collection/handling, submission of specimen other than nasopharyngeal swab, presence of viral mutation(s) within the areas targeted by this assay, and inadequate number of viral copies(<138 copies/mL). A negative result must be combined with clinical observations, patient history, and epidemiological information. The expected result is Negative.  Fact Sheet for Patients:  EntrepreneurPulse.com.au  Fact Sheet for Healthcare Providers:  IncredibleEmployment.be  This test is no t yet approved or cleared by the [place] FDA and  has been authorized for detection  and/or diagnosis of SARS-CoV-2 by FDA under an Emergency Use Authorization (EUA). This EUA will remain  in effect (meaning this test can be used) for the duration of the COVID-19 declaration under Section 564(b)(1) of the Act, 21 U.S.C.section 360bbb-3(b)(1), unless the authorization  is terminated  or revoked sooner.       Influenza A by PCR NEGATIVE NEGATIVE Final   Influenza B by PCR NEGATIVE NEGATIVE Final    Comment: (NOTE) The Xpert Xpress SARS-CoV-2/FLU/RSV plus assay is intended as an aid in the diagnosis of influenza from Nasopharyngeal swab specimens and should not be used as a sole basis for treatment. Nasal washings and aspirates are unacceptable for Xpert Xpress SARS-CoV-2/FLU/RSV testing.  Fact Sheet for Patients: EntrepreneurPulse.com.au  Fact Sheet for Healthcare Providers: IncredibleEmployment.be  This test is not yet approved or cleared by the [place] FDA and has been authorized for detection and/or diagnosis of SARS-CoV-2 by FDA under an Emergency Use Authorization (EUA). This EUA will remain in effect (meaning this test can be used) for the duration of the COVID-19 declaration under Section 564(b)(1) of the Act, 21 U.S.C. section 360bbb-3(b)(1), unless the authorization is terminated or revoked.  Performed at [facility], [place] [address], [place], [place] [address]      Time coordinating discharge: Over 30 minutes  SIGNED:   Phillips Climes, MD  [facility] 11/30/2021, 9:24 AM Pager   If 7PM-7AM, please contact night-coverage [email or web address] Password [email or web address]

## 2021-11-30 NOTE — Discharge Instructions (Signed)
Follow with Primary MD Minette Brine, FNP in 7 days   Get CBC, CMP, checked  by Primary MD next visit.    Activity: As tolerated with Full fall precautions use walker/cane & assistance as needed   Disposition Home    Diet: Heart Healthy  .  On your next visit with your primary care physician please Get Medicines reviewed and adjusted.   Please request your Prim.MD to go over all Hospital Tests and Procedure/Radiological results at the follow up, please get all Hospital records sent to your Prim MD by signing hospital release before you go home.   If you experience worsening of your admission symptoms, develop shortness of breath, life threatening emergency, suicidal or homicidal thoughts you must seek medical attention immediately by calling 911 or calling your MD immediately  if symptoms less severe.  You Must read complete instructions/literature along with all the possible adverse reactions/side effects for all the Medicines you take and that have been prescribed to you. Take any new Medicines after you have completely understood and accpet all the possible adverse reactions/side effects.   Do not drive, operating heavy machinery, perform activities at heights, swimming or participation in water activities or provide baby sitting services if your were admitted for syncope or siezures until you have seen by Primary MD or a Neurologist and advised to do so again.  Do not drive when taking Pain medications.    Do not take more than prescribed Pain, Sleep and Anxiety Medications  Special Instructions: If you have smoked or chewed Tobacco  in the last 2 yrs please stop smoking, stop any regular Alcohol  and or any Recreational drug use.  Wear Seat belts while driving.   Please note  You were cared for by a hospitalist during your hospital stay. If you have any questions about your discharge medications or the care you received while you were in the hospital after you are  discharged, you can call the unit and asked to speak with the hospitalist on call if the hospitalist that took care of you is not available. Once you are discharged, your primary care physician will handle any further medical issues. Please note that NO REFILLS for any discharge medications will be authorized once you are discharged, as it is imperative that you return to your primary care physician (or establish a relationship with a primary care physician if you do not have one) for your aftercare needs so that they can reassess your need for medications and monitor your lab values.

## 2021-12-01 ENCOUNTER — Telehealth: Payer: Self-pay

## 2021-12-01 DIAGNOSIS — M79604 Pain in right leg: Secondary | ICD-10-CM | POA: Diagnosis not present

## 2021-12-01 DIAGNOSIS — M79671 Pain in right foot: Secondary | ICD-10-CM | POA: Diagnosis not present

## 2021-12-01 DIAGNOSIS — G894 Chronic pain syndrome: Secondary | ICD-10-CM | POA: Diagnosis not present

## 2021-12-01 DIAGNOSIS — M25471 Effusion, right ankle: Secondary | ICD-10-CM | POA: Diagnosis not present

## 2021-12-01 DIAGNOSIS — M79605 Pain in left leg: Secondary | ICD-10-CM | POA: Diagnosis not present

## 2021-12-01 DIAGNOSIS — M25521 Pain in right elbow: Secondary | ICD-10-CM | POA: Diagnosis not present

## 2021-12-01 DIAGNOSIS — M25512 Pain in left shoulder: Secondary | ICD-10-CM | POA: Diagnosis not present

## 2021-12-01 DIAGNOSIS — M542 Cervicalgia: Secondary | ICD-10-CM | POA: Diagnosis not present

## 2021-12-01 DIAGNOSIS — M25561 Pain in right knee: Secondary | ICD-10-CM | POA: Diagnosis not present

## 2021-12-01 DIAGNOSIS — M79672 Pain in left foot: Secondary | ICD-10-CM | POA: Diagnosis not present

## 2021-12-01 DIAGNOSIS — Z79891 Long term (current) use of opiate analgesic: Secondary | ICD-10-CM | POA: Diagnosis not present

## 2021-12-01 NOTE — Telephone Encounter (Signed)
Transition Care Management Follow-up Telephone Call Date of discharge and from where: 11/30/2021 [facility] How have you been since you were released from the hospital? Pt reports, she is having trouble with receiving medication.  Any questions or concerns? No  Items Reviewed: Did the pt receive and understand the discharge instructions provided? Yes  Medications obtained and verified? Yes  Other? Yes  Any new allergies since your discharge? No  Dietary orders reviewed? Yes Do you have support at home? Yes   Home Care and Equipment/Supplies: Were home health services ordered? not applicable If so, what is the name of the agency? N/a  Has the agency set up a time to come to the patient's home? not applicable Were any new equipment or medical supplies ordered?  No What is the name of the medical supply agency? N/a Were you able to get the supplies/equipment? not applicable Do you have any questions related to the use of the equipment or supplies? No  Functional Questionnaire: (I = Independent and D = Dependent) ADLs: i  Bathing/Dressing- i  Meal Prep- i  Eating- i  Maintaining continence- i  Transferring/Ambulation- i  Managing Meds- i  Follow up appointments reviewed:  PCP Hospital f/u appt confirmed? Yes  Scheduled to see n/a on n/a @ n/a. [facility] f/u appt confirmed? No  Scheduled to see n/a on n/a @ n/a. Are transportation arrangements needed? No  If their condition worsens, is the pt aware to call PCP or go to the Emergency Dept.? Yes Was the patient provided with contact information for the PCP's office or ED? Yes Was to pt encouraged to call back with questions or concerns? Yes

## 2021-12-01 NOTE — Telephone Encounter (Signed)
Transition Care Management Follow-up Telephone Call Date of discharge and from where: 11/30/2021 [facility] How have you been since you were released from the hospital? Doing ok Any questions or concerns? Yes, working on getting medications that were ordered from hospital  Items Reviewed: Did the pt receive and understand the discharge instructions provided? Yes  Medications obtained and verified? No , she is currently at the pharmacy working on getting medications. They were not available at [facility]. Going to [facility] Other? No  Any new allergies since your discharge? No  Dietary orders reviewed? Yes Do you have support at home? Yes   Home Care and Equipment/Supplies: Were home health services ordered? no If so, what is the name of the agency? N/a  Has the agency set up a time to come to the patient's home? not applicable Were any new equipment or medical supplies ordered?  No What is the name of the medical supply agency? N/a Were you able to get the supplies/equipment? not applicable Do you have any questions related to the use of the equipment or supplies? No  Functional Questionnaire: (I = Independent and D = Dependent) ADLs: I  Bathing/Dressing- I  Meal Prep- I  Eating- I  Maintaining continence- I  Transferring/Ambulation- I  Managing Meds- I  Follow up appointments reviewed:  PCP Hospital f/u appt confirmed? No   Are transportation arrangements needed? No  If their condition worsens, is the pt aware to call PCP or go to the Emergency Dept.? Yes Was the patient provided with contact information for the PCP's office or ED? Yes Was to pt encouraged to call back with questions or concerns? Yes

## 2021-12-01 NOTE — Telephone Encounter (Signed)
Opened in error

## 2021-12-07 NOTE — Progress Notes (Signed)
I,Victoria T Hamilton,acting as a Education administrator for Minette Brine, FNP.,have documented all relevant documentation on the behalf of Minette Brine, FNP,as directed by  Minette Brine, FNP while in the presence of Minette Brine, [place].   This visit occurred during the SARS-CoV-2 public health emergency.  Safety protocols were in place, including screening questions prior to the visit, additional usage of staff PPE, and extensive cleaning of exam room while observing appropriate contact time as indicated for disinfecting solutions.  Subjective:     Patient ID: Shelby Robertson , female    DOB: 14-Feb-1971 , 51 y.o.   MRN: [id number]   Chief Complaint  Patient presents with   Hospitalization Follow-up    HPI  Pt presents today for HPFU. Pt admitted on 11/26/2021, discharged on 11/30/2021 for Pyelonephritis/Sepsis. She also had where her blood pressure dropped and held her blood pressure medications. She is now back on amlodipine, HCTZ and metoprolol which she takes once a day.  Pt main concern, medication cefadroxil. Her designated pharmacy told pt that they were out of stock but could order it for her. Once ordered the pharmacy now tells the pt they are having trouble with getting the actual medication in. She does not take med currently, never started.   She was in [place] due to a urinary tract infection, was given a medication but began to feel worse. She is unsure of which urgent care she was seen at in [place].     Past Medical History:  Diagnosis Date   Diabetes mellitus without complication (HCC)    Fibromyalgia    Fibromyalgia    Hypertension    Osteoarthritis    Rheumatoid arthritis(714.0)    Rheumatoid arthritis(714.0)      Family History  Problem Relation Age of Onset   Diabetes Mother    Healthy Father    Heart attack Maternal Grandmother    Stroke Maternal Grandmother      Current Outpatient Medications:    acetaminophen (TYLENOL) 325 MG tablet, Take 2 tablets (650 mg total) by mouth  every 6 (six) hours as needed for mild pain (or Fever >/= 101)., Disp: , Rfl:    Adalimumab (HUMIRA PEN) 40 MG/0.8ML PNKT, INJECT 1 PEN INTO THE SKIN EVERY 14 DAYS (Patient taking differently: Inject 40 mg into the skin every 14 (fourteen) days.), Disp: 2 each, Rfl: 0   albuterol (PROVENTIL) (2.5 MG/3ML) 0.083% nebulizer solution, Take 3 mLs (2.5 mg total) by nebulization every 6 (six) hours as needed for wheezing or shortness of breath., Disp: 75 mL, Rfl: 12   albuterol (VENTOLIN HFA) 108 (90 Base) MCG/ACT inhaler, Inhale 2 puffs into the lungs every 4 (four) hours as needed for wheezing or shortness of breath (cough, shortness of breath or wheezing.)., Disp: 6.7 g, Rfl: 4   aspirin 325 MG tablet, Take 325 mg by mouth every 6 (six) hours as needed (pain)., Disp: , Rfl:    atorvastatin (LIPITOR) 20 MG tablet, TAKE 1 TABLET BY MOUTH EVERY DAY (Patient taking differently: Take 20 mg by mouth daily.), Disp: 90 tablet, Rfl: 1   B-D TB SYRINGE 1CC/27GX1/2" 27G X 1/2" 1 ML MISC, USE 1 SYRINGE TO INJECT METHOTREXATE INTO THE SKIN ONCE A WEEK., Disp: 12 each, Rfl: 3   blood glucose meter kit and supplies KIT, Dispense based on patient and insurance preference. Use up to two times daily as directed. (FOR ICD-9 250.00, 250.01)., Disp: 1 each, Rfl: 0   diphenhydrAMINE (BENADRYL) 25 MG tablet, Take 25 mg by mouth every  6 (six) hours as needed for itching, allergies or sleep., Disp: , Rfl:    DULoxetine (CYMBALTA) 60 MG capsule, Take 60 mg by mouth daily., Disp: , Rfl:    ezetimibe (ZETIA) 10 MG tablet, TAKE 1 TABLET BY MOUTH EVERY DAY (Patient taking differently: Take 10 mg by mouth daily.), Disp: 90 tablet, Rfl: 1   hydrochlorothiazide (HYDRODIURIL) 12.5 MG tablet, Take 1 tablet (12.5 mg total) by mouth daily as needed (fluid/edema)., Disp: 90 tablet, Rfl: 1   HYDROcodone-acetaminophen (NORCO) 10-325 MG tablet, Take 1 tablet by mouth every 6 (six) hours as needed for moderate pain., Disp: , Rfl:    hydrOXYzine  (ATARAX/VISTARIL) 25 MG tablet, Take 1 tablet (25 mg total) by mouth every 8 (eight) hours as needed for itching., Disp: 30 tablet, Rfl: 0   Lancets (ONETOUCH DELICA PLUS [id number]) MISC, 2 (two) times daily. use for testing, Disp: , Rfl:    metFORMIN (GLUCOPHAGE) 500 MG tablet, TAKE 1 TABLET BY MOUTH 2 TIMES DAILY WITH A MEAL. (Patient taking differently: Take 500 mg by mouth 2 (two) times daily with a meal.), Disp: 180 tablet, Rfl: 1   methotrexate 50 MG/2ML injection, Inject 0.8 mls (20 mg total) into the skin weekly., Disp: 10 mL, Rfl: 0   ONETOUCH VERIO test strip, TEST TWICE DAILY, Disp: 100 strip, Rfl: 3   tiZANidine (ZANAFLEX) 4 MG capsule, Take 4 mg by mouth every 6 (six) hours., Disp: , Rfl:    triamcinolone ointment (KENALOG) 0.1 %, Apply 1 application topically daily as needed (rash, itching)., Disp: , Rfl:    Vitamin D, Ergocalciferol, (DRISDOL) 1.25 MG (50000 UNIT) CAPS capsule, TAKE 1 CAPSULE BY MOUTH ON TUES AND ONE ON FRIDAYS. (Patient taking differently: Take 50,000 Units by mouth 2 (two) times a week. Take 1 capsule by mouth on Tues and one on Fridays.), Disp: 24 capsule, Rfl: 0   amLODipine (NORVASC) 10 MG tablet, Take 1 tablet (10 mg total) by mouth daily., Disp: 90 tablet, Rfl: 1   cefadroxil (DURICEF) 1 g tablet, Take 1 tablet (1 g total) by mouth 2 (two) times daily., Disp: 20 tablet, Rfl: 0   metoprolol tartrate (LOPRESSOR) 50 MG tablet, Take 1 tablet (50 mg total) by mouth daily. Please schedule appointment for refills., Disp: 90 tablet, Rfl: 1   No Known Allergies   Review of Systems  Constitutional: Negative.   Respiratory: Negative.    Cardiovascular: Negative.   Neurological: Negative.   Psychiatric/Behavioral: Negative.      Today's Vitals   12/08/21 0907  BP: 132/90  Pulse: (!) 113  Temp: 98.1 F (36.7 C)  Weight: 274 lb (124.3 kg)  Height: 5' 6"  (1.676 m)   Body mass index is 44.22 kg/m.  Wt Readings from Last 3 Encounters:  12/08/21 274 lb (124.3 kg)   11/26/21 275 lb (124.7 kg)  07/28/21 273 lb 12.8 oz (124.2 kg)    Objective:  Physical Exam Vitals reviewed.  Constitutional:      General: She is not in acute distress.    Appearance: Normal appearance. She is well-developed. She is obese.  HENT:     Head: Normocephalic and atraumatic.  Eyes:     Pupils: Pupils are equal, round, and reactive to light.  Cardiovascular:     Rate and Rhythm: Normal rate and regular rhythm.     Pulses: Normal pulses.     Heart sounds: Normal heart sounds. No murmur heard. Pulmonary:     Effort: Pulmonary effort is normal.  Breath sounds: Normal breath sounds.  Chest:     Chest wall: No tenderness.  Musculoskeletal:        General: Normal range of motion.  Skin:    General: Skin is warm and dry.     Capillary Refill: Capillary refill takes less than 2 seconds.  Neurological:     General: No focal deficit present.     Mental Status: She is alert and oriented to person, place, and time.     Cranial Nerves: No cranial nerve deficit.  Psychiatric:        Mood and Affect: Mood normal.        Behavior: Behavior normal.        Thought Content: Thought content normal.        Judgment: Judgment normal.        Assessment And Plan:     1. Pyelonephritis Comments: I have sent her cefoxidil to [facility], she is to make [place] aware if she is unable to get the medication. Will request records from Urgent Care in [place].  - POCT Urinalysis Dipstick (81002) - CBC no Diff - Culture, Urine - cefadroxil (DURICEF) 1 g tablet; Take 1 tablet (1 g total) by mouth 2 (two) times daily.  Dispense: 20 tablet; Refill: 0  2. Essential hypertension Comments: Blood pressure is fairly controlled, diastolic is slightly elevated. Continue HCTZ, Amlodipine and metoprolol - has been taking once a day may increase if remai - metoprolol tartrate (LOPRESSOR) 50 MG tablet; Take 1 tablet (50 mg total) by mouth daily. Please schedule appointment for refills.  Dispense: 90  tablet; Refill: 1 - amLODipine (NORVASC) 10 MG tablet; Take 1 tablet (10 mg total) by mouth daily.  Dispense: 90 tablet; Refill: 1  3. Mixed hyperlipidemia Comments: Stable, tolerating statin well.  - Lipid panel  4. Controlled type 2 diabetes mellitus with complication, without long-term current use of insulin (HCC) Comments: HgbA1c was 6.1 during hospitalization - Microalbumin / Creatinine Urine Ratio - POCT Urinalysis Dipstick (81002) - CMP14+EGFR  5. Class 3 severe obesity due to excess calories without serious comorbidity with body mass index (BMI) of 45.0 to 49.9 in adult [facility]) She is encouraged to strive for BMI less than 30 to decrease cardiac risk. Advised to aim for at least 150 minutes of exercise per week.   6. Encounter for screening colonoscopy Comments: She did not go last year when referral made, will place new referral.  - Ambulatory referral to Gastroenterology   She NO SHOWED for her physical in December was to get a PAP, I have advised her to schedule for a physical with PAP in 4 months. Also discussed her NO SHOWS the last 2 appts, advised if she has multiple no shows she would be discharged from the practice. Verbalizes an understanding.  Patient was given opportunity to ask questions. Patient verbalized understanding of the plan and was able to repeat key elements of the plan. All questions were answered to their satisfaction.  Minette Brine, FNP   I, Minette Brine, FNP, have reviewed all documentation for this visit. The documentation on 12/08/21 for the exam, diagnosis, procedures, and orders are all accurate and complete.   IF YOU HAVE BEEN REFERRED TO A SPECIALIST, IT MAY TAKE 1-2 WEEKS TO SCHEDULE/PROCESS THE REFERRAL. IF YOU HAVE NOT HEARD FROM US/SPECIALIST IN TWO WEEKS, PLEASE GIVE [place] A CALL AT [phone].   THE PATIENT IS ENCOURAGED TO PRACTICE SOCIAL DISTANCING DUE TO THE COVID-19 PANDEMIC.

## 2021-12-08 ENCOUNTER — Encounter: Payer: Self-pay | Admitting: Nurse Practitioner

## 2021-12-08 ENCOUNTER — Ambulatory Visit (INDEPENDENT_AMBULATORY_CARE_PROVIDER_SITE_OTHER): Payer: Medicare HMO | Admitting: Nurse Practitioner

## 2021-12-08 ENCOUNTER — Other Ambulatory Visit: Payer: Self-pay

## 2021-12-08 VITALS — BP 132/90 | HR 113 | Temp 98.1°F | Ht 66.0 in | Wt 274.0 lb

## 2021-12-08 DIAGNOSIS — E1169 Type 2 diabetes mellitus with other specified complication: Secondary | ICD-10-CM | POA: Diagnosis not present

## 2021-12-08 DIAGNOSIS — E118 Type 2 diabetes mellitus with unspecified complications: Secondary | ICD-10-CM

## 2021-12-08 DIAGNOSIS — E782 Mixed hyperlipidemia: Secondary | ICD-10-CM | POA: Diagnosis not present

## 2021-12-08 DIAGNOSIS — E66813 Obesity, class 3: Secondary | ICD-10-CM

## 2021-12-08 DIAGNOSIS — N12 Tubulo-interstitial nephritis, not specified as acute or chronic: Secondary | ICD-10-CM | POA: Diagnosis not present

## 2021-12-08 DIAGNOSIS — I10 Essential (primary) hypertension: Secondary | ICD-10-CM | POA: Diagnosis not present

## 2021-12-08 DIAGNOSIS — Z1211 Encounter for screening for malignant neoplasm of colon: Secondary | ICD-10-CM | POA: Diagnosis not present

## 2021-12-08 DIAGNOSIS — Z6841 Body Mass Index (BMI) 40.0 and over, adult: Secondary | ICD-10-CM | POA: Diagnosis not present

## 2021-12-08 LAB — CMP14+EGFR
ALT: 8 IU/L (ref 0–32)
AST: 5 IU/L (ref 0–40)
Albumin/Globulin Ratio: 1 — ABNORMAL LOW (ref 1.2–2.2)
Albumin: 3.9 g/dL (ref 3.8–4.8)
Alkaline Phosphatase: 91 IU/L (ref 44–121)
BUN/Creatinine Ratio: 16 (ref 9–23)
BUN: 12 mg/dL (ref 6–24)
Bilirubin Total: <0.2 mg/dL (ref 0.0–1.2)
CO2: 26 mmol/L (ref 20–29)
Calcium: 9.6 mg/dL (ref 8.7–10.2)
Chloride: 100 mmol/L (ref 96–106)
Creatinine, Ser: 0.75 mg/dL (ref 0.57–1.00)
Globulin, Total: 3.8 g/dL (ref 1.5–4.5)
Glucose: 107 mg/dL — ABNORMAL HIGH (ref 70–99)
Potassium: 3.9 mmol/L (ref 3.5–5.2)
Sodium: 139 mmol/L (ref 134–144)
Total Protein: 7.7 g/dL (ref 6.0–8.5)
eGFR: 98 mL/min/{1.73_m2} (ref >59)

## 2021-12-08 LAB — CBC
Hematocrit: 41 % (ref 34.0–46.6)
Hemoglobin: 13.1 g/dL (ref 11.1–15.9)
MCH: 28.9 pg (ref 26.6–33.0)
MCHC: 32 g/dL (ref 31.5–35.7)
MCV: 91 fL (ref 79–97)
Platelets: 484 10*3/uL — ABNORMAL HIGH (ref 150–450)
RBC: 4.53 x10E6/uL (ref 3.77–5.28)
RDW: 13.7 % (ref 11.7–15.4)
WBC: 9.6 10*3/uL (ref 3.4–10.8)

## 2021-12-08 LAB — LIPID PANEL
Chol/HDL Ratio: 4.6 ratio — ABNORMAL HIGH (ref 0.0–4.4)
Cholesterol, Total: 152 mg/dL (ref 100–199)
HDL: 33 mg/dL — ABNORMAL LOW (ref >39)
LDL Chol Calc (NIH): 76 mg/dL (ref 0–99)
Triglycerides: 262 mg/dL — ABNORMAL HIGH (ref 0–149)
VLDL Cholesterol Cal: 43 mg/dL — ABNORMAL HIGH (ref 5–40)

## 2021-12-08 MED ORDER — AMLODIPINE BESYLATE 10 MG PO TABS: 10.0000 mg | ORAL_TABLET | Freq: Every day | ORAL | 1 refills | Status: DC

## 2021-12-08 MED ORDER — METOPROLOL TARTRATE 50 MG PO TABS: 50.0000 mg | ORAL_TABLET | Freq: Every day | ORAL | 1 refills | Status: DC

## 2021-12-08 MED ORDER — CEFADROXIL 1 G PO TABS: 1.0000 g | ORAL_TABLET | Freq: Two times a day (BID) | ORAL | 0 refills | Status: DC

## 2021-12-08 NOTE — Patient Instructions (Signed)

## 2021-12-08 NOTE — Progress Notes (Signed)
Office Visit Note  Patient: Shelby Robertson             Date of Birth: 12/23/1970           MRN: [id number]             PCP: Minette Brine, FNP Referring: Minette Brine, FNP Visit Date: 12/22/2021 Occupation: @GUAROCC @  Subjective:  Medication management  History of Present Illness: Shelby Robertson is a 51 y.o. female with history of rheumatoid arthritis, osteoarthritis and fibromyalgia.  She states that she went to [place] in January.  She states she felt a lot of pressure in the bladder area.  She was seen in the emergency room where she was diagnosed with urinary tract infection.  She took antibiotics which were prescribed in the emergency room.  When she came back her symptoms got worse.  She states she went to the emergency room on November 26, 2021.  She was having chills and hematuria at the time.  She was hospitalized and was given IV antibiotics.  Her labs came positive for E. coli sepsis and pyelonephritis.  After receiving 4 days of IV Rocephin she was discharged on oral cefadroxil.  Her symptoms improved while she was in the hospital.  She stopped Humira and methotrexate when she had infection.  She states that she restarted the medications about 2 weeks ago.  She states that she did not have a flare.  She denies any joint pain or joint swelling today.  Activities of Daily Living:  Patient reports morning stiffness for 10-15 minutes.   Patient Denies nocturnal pain.  Difficulty dressing/grooming: Denies Difficulty climbing stairs: Denies Difficulty getting out of chair: Denies Difficulty using hands for taps, buttons, cutlery, and/or writing: Denies  Review of Systems  Constitutional:  Positive for fatigue.  HENT:  Negative for mouth sores, mouth dryness and nose dryness.   Eyes:  Positive for itching and dryness. Negative for pain.  Respiratory:  Negative for shortness of breath and difficulty breathing.   Cardiovascular:  Negative for chest pain and palpitations.   Gastrointestinal:  Negative for blood in stool, constipation and diarrhea.  Endocrine: Negative for increased urination.  Genitourinary:  Negative for difficulty urinating.  Musculoskeletal:  Positive for joint pain, joint pain, joint swelling, myalgias, morning stiffness, muscle tenderness and myalgias.  Skin:  Negative for color change, rash, redness and sensitivity to sunlight.  Allergic/Immunologic: Negative for susceptible to infections.  Neurological:  Negative for dizziness, numbness, headaches, memory loss and weakness.  Hematological:  Negative for bruising/bleeding tendency.  Psychiatric/Behavioral:  Negative for confusion.    PMFS History:  Patient Active Problem List   Diagnosis Date Noted   Sepsis due to Escherichia coli (E. coli) ([place]) 11/28/2021   Pyelonephritis 11/26/2021   Hematuria 11/26/2021   Tachycardia 11/26/2021   Controlled type 2 diabetes mellitus with complication, without long-term current use of insulin ([place]) 10/14/2020   Sepsis ([place]) 09/17/2019   AMS (altered mental status) 09/17/2019   Morbid obesity ([place]) 01/10/2019   Prediabetes 01/10/2019   Tobacco use disorder, continuous 01/10/2019   Soft tissue swelling of back 01/10/2019   Fibromyalgia 07/27/2017   Other fatigue 07/27/2017   History of hidradenitis suppurativa 07/27/2017   History of hypertension 07/27/2017   History of hyperlipidemia 07/27/2017   Smoker 07/27/2017   High risk medication use 08/21/2016   Rheumatoid arthritis of multiple sites without rheumatoid factor ([place]) 08/12/2016   ARTHRITIS, GENERALIZED 07/14/2010   HYPERTENSION, BENIGN ESSENTIAL 03/04/2009   POLYURIA 03/04/2009  FIBROMYALGIA 11/09/2007   HIDRADENITIS SUPPURATIVA 07/11/2007   Mixed hyperlipidemia 06/28/2007   LEG EDEMA, BILATERAL 05/05/2007    Past Medical History:  Diagnosis Date   Diabetes mellitus without complication (HCC)    Fibromyalgia    Fibromyalgia    Hypertension    Osteoarthritis    Rheumatoid  arthritis(714.0)    Rheumatoid arthritis(714.0)     Family History  Problem Relation Age of Onset   Diabetes Mother    Healthy Father    Heart attack Maternal Grandmother    Stroke Maternal Grandmother    History reviewed. No pertinent surgical history. Social History   Social History Narrative   Not on file   Immunization History  Administered Date(s) Administered   Influenza,inj,Quad PF,6+ Mos 09/18/2019, 10/14/2020, 07/15/2021   Influenza-Unspecified 07/18/2014   PFIZER(Purple Top)SARS-COV-2 Vaccination 02/11/2020, 03/03/2020   Td 11/11/2008     Objective: Vital Signs: BP 127/86 (BP Location: Left Arm, Patient Position: Sitting, Cuff Size: Large)    Pulse 99    Ht 5\' 6"  (1.676 m)    Wt 276 lb 6.4 oz (125.4 kg)    BMI 44.61 kg/m    Physical Exam Vitals and nursing note reviewed.  Constitutional:      Appearance: She is well-developed.  HENT:     Head: Normocephalic and atraumatic.  Eyes:     Conjunctiva/sclera: Conjunctivae normal.  Cardiovascular:     Rate and Rhythm: Normal rate and regular rhythm.     Heart sounds: Normal heart sounds.  Pulmonary:     Effort: Pulmonary effort is normal.     Breath sounds: Normal breath sounds.  Abdominal:     General: Bowel sounds are normal.     Palpations: Abdomen is soft.  Musculoskeletal:     Cervical back: Normal range of motion.  Lymphadenopathy:     Cervical: No cervical adenopathy.  Skin:    General: Skin is warm and dry.     Capillary Refill: Capillary refill takes less than 2 seconds.  Neurological:     Mental Status: She is alert and oriented to person, place, and time.  Psychiatric:        Behavior: Behavior normal.     Musculoskeletal Exam: C-spine was in good range of motion.  Shoulder joints, elbow joints, wrist joints, MCPs PIPs and DIPs with good range of motion with no synovitis.  Hip joints, knee joints, ankles, MTPs and PIPs with good range of motion with no synovitis.  CDAI Exam: CDAI Score: 0   Patient Global: 0 mm; Provider Global: 0 mm Swollen: 0 ; Tender: 0  Joint Exam 12/22/2021   No joint exam has been documented for this visit   There is currently no information documented on the homunculus. Go to the Rheumatology activity and complete the homunculus joint exam.  Investigation: No additional findings.  Imaging: DG Chest Port 1 View  Result Date: 11/26/2021 CLINICAL DATA:  Chills, back pain, sepsis EXAM: PORTABLE CHEST 1 VIEW COMPARISON:  09/16/2019 FINDINGS: Increased interstitial markings without frank interstitial edema. Mild bibasilar atelectasis. No pleural effusion or pneumothorax. The heart is normal in size. Chronic eventration of the right hemidiaphragm. IMPRESSION: Chronic eventration of the right hemidiaphragm with mild bibasilar atelectasis. No frank interstitial edema. Electronically Signed   By: Julian Hy M.D.   On: 11/26/2021 21:42   CT Renal Stone Study  Result Date: 11/26/2021 CLINICAL DATA:  Low back pain, chills, recent UTI EXAM: CT ABDOMEN AND PELVIS WITHOUT CONTRAST TECHNIQUE: Multidetector CT imaging of the  abdomen and pelvis was performed following the standard protocol without IV contrast. RADIATION DOSE REDUCTION: This exam was performed according to the departmental dose-optimization program which includes automated exposure control, adjustment of the mA and/or kV according to patient size and/or use of iterative reconstruction technique. COMPARISON:  None. FINDINGS: Lower chest: Lung bases are clear. Eventration of the right hemidiaphragm. Hepatobiliary: Unenhanced liver is unremarkable. Numerous gallstones (series 3/image 36), without associated inflammatory changes. No intrahepatic or extrahepatic ductal dilatation. Pancreas: Within normal limits. Spleen: Within normal limits. Adrenals/Urinary Tract: Adrenal glands are within normal limits. Bilateral renal cysts, poorly evaluated on unenhanced CT, measuring up to 4.2 cm in the posterior right  lower kidney (series 3/image 46). Multiple nonobstructing bilateral lower pole renal calculi, measuring up to 12 mm in the right lower pole (series 3/image 47). No hydronephrosis. Thick-walled bladder, although underdistended. Stomach/Bowel: Stomach is within normal limits. No evidence of bowel obstruction. Appendix is not discretely visualized. No colonic wall thickening or inflammatory changes. Vascular/Lymphatic: No evidence of abdominal aortic aneurysm. Atherosclerotic calcifications of the abdominal aorta and branch vessels. No suspicious abdominopelvic lymphadenopathy. Reproductive: Uterus and bilateral ovaries are within normal limits. Other: No abdominopelvic ascites. Musculoskeletal: Mild degenerative changes of the lower thoracic spine. IMPRESSION: Mildly thick-walled bladder, correlate for cystitis. Multiple nonobstructing bilateral renal calculi, measuring up to 12 mm in the right lower pole. Bilateral renal cysts, poorly evaluated on unenhanced CT, measuring up to 4.2 cm in the right lower kidney. Cholelithiasis, without associated inflammatory changes. Electronically Signed   By: Julian Hy M.D.   On: 11/26/2021 23:01    Recent Labs: Lab Results  Component Value Date   WBC 9.6 12/08/2021   HGB 13.1 12/08/2021   PLT 484 (H) 12/08/2021   NA 139 12/08/2021   K 3.9 12/08/2021   CL 100 12/08/2021   CO2 26 12/08/2021   GLUCOSE 107 (H) 12/08/2021   BUN 12 12/08/2021   CREATININE 0.75 12/08/2021   BILITOT <0.2 12/08/2021   ALKPHOS 91 12/08/2021   AST 5 12/08/2021   ALT 8 12/08/2021   PROT 7.7 12/08/2021   ALBUMIN 3.9 12/08/2021   CALCIUM 9.6 12/08/2021   GFRAA 128 02/12/2021   QFTBGOLDPLUS NEGATIVE 07/28/2021    Speciality Comments: No specialty comments available.  Procedures:  No procedures performed Allergies: Patient has no known allergies.   Assessment / Plan:     Visit Diagnoses: Rheumatoid arthritis of multiple sites without rheumatoid factor (HCC)-patient had no  synovitis on my examination.  Patient states that she stopped Humira and methotrexate prior to her hospitalization.  She was hospitalized in February for urinary tract infection and sepsis due to E. coli.  She had repeat urine culture and after that she resumed her medications.  She restarted Humira and methotrexate about 2 weeks ago.  I had a detailed discussion with the patient.  She has not had synovitis in a while.  I advised her to space Humira to every 3 weeks for now.  If she does really well we can space Humira to every 4 weeks.  Maybe in the future we can get her off Humira.  Increased risk of infection with Humira, and methotrexate was discussed at length.  She also has increased risk of infection being diabetic.  High risk medication use - Humira 40 mg subcutaneous injections every 14 days, methotrexate 0.8 mL sq injections once weekly, and folic acid 2 mg daily.  Labs obtained on December 08, 2021 showed CBC was normal except mild thrombocytosis which could  be due to infection.  CMP with GFR was normal.  Urine culture showed contamination.  Information on immunization was placed in the AVS.  She was also advised to hold Humira and methotrexate in case she develops an infection and resume after the infection resolves.  Primary osteoarthritis of both knees-she is currently not having discomfort in her knee joints.  History of hidradenitis suppurativa-she has not had any recurrence since she has been on Humira.  I discussed its possible that she may have recurrence of hidradenitis on lowering the dose of Humira.  Fibromyalgia -she continues to have some generalized pain and discomfort.  Her symptoms are fairly well controlled on tizanidine 4 mg every 6 hours as needed for muscle spasms.    Trapezius muscle spasm-she is off-and-on discomfort in the trapezius region.  Other fatigue-she continues to have chronic fatigue.  History of hyperlipidemia-dietary modifications and exercise was  emphasized.  History of hypertension-blood pressure was normal today.  Smoker-  Sepsis due to Escherichia coli, unspecified whether acute organ dysfunction present (HCC)-ER notes were reviewed.  Patient was hospitalized on November 26, 2021 for E. coli sepsis.  She was treated with IV Rocephin and then discharged on oral antibiotics.  Repeat urine culture is negative.  She had recent labs which were normal.  Orders: No orders of the defined types were placed in this encounter.  No orders of the defined types were placed in this encounter.    Follow-Up Instructions: Return in about 3 months (around 03/24/2022) for Rheumatoid arthritis, Osteoarthritis.   Bo Merino, MD  Note - This record has been created using Editor, commissioning.  Chart creation errors have been sought, but may not always  have been located. Such creation errors do not reflect on  the standard of medical care.

## 2021-12-09 ENCOUNTER — Other Ambulatory Visit (HOSPITAL_COMMUNITY): Payer: Self-pay

## 2021-12-09 LAB — MICROALBUMIN / CREATININE URINE RATIO
Creatinine, Urine: 201.6 mg/dL
Microalb/Creat Ratio: 28 mg/g creat (ref 0–29)
Microalbumin, Urine: 56.3 ug/mL

## 2021-12-10 LAB — URINE CULTURE

## 2021-12-11 ENCOUNTER — Other Ambulatory Visit (HOSPITAL_COMMUNITY): Payer: Self-pay

## 2021-12-14 ENCOUNTER — Other Ambulatory Visit (HOSPITAL_COMMUNITY): Payer: Self-pay

## 2021-12-22 ENCOUNTER — Ambulatory Visit (INDEPENDENT_AMBULATORY_CARE_PROVIDER_SITE_OTHER): Payer: Medicare HMO | Admitting: Rheumatology

## 2021-12-22 ENCOUNTER — Encounter: Payer: Self-pay | Admitting: Rheumatology

## 2021-12-22 ENCOUNTER — Other Ambulatory Visit: Payer: Self-pay

## 2021-12-22 VITALS — BP 127/86 | HR 99 | Ht 66.0 in | Wt 276.4 lb

## 2021-12-22 DIAGNOSIS — Z8679 Personal history of other diseases of the circulatory system: Secondary | ICD-10-CM

## 2021-12-22 DIAGNOSIS — Z79899 Other long term (current) drug therapy: Secondary | ICD-10-CM | POA: Diagnosis not present

## 2021-12-22 DIAGNOSIS — M0609 Rheumatoid arthritis without rheumatoid factor, multiple sites: Secondary | ICD-10-CM

## 2021-12-22 DIAGNOSIS — Z8639 Personal history of other endocrine, nutritional and metabolic disease: Secondary | ICD-10-CM | POA: Diagnosis not present

## 2021-12-22 DIAGNOSIS — M170 Bilateral primary osteoarthritis of knee: Secondary | ICD-10-CM

## 2021-12-22 DIAGNOSIS — F17200 Nicotine dependence, unspecified, uncomplicated: Secondary | ICD-10-CM

## 2021-12-22 DIAGNOSIS — R5383 Other fatigue: Secondary | ICD-10-CM | POA: Diagnosis not present

## 2021-12-22 DIAGNOSIS — M62838 Other muscle spasm: Secondary | ICD-10-CM

## 2021-12-22 DIAGNOSIS — M797 Fibromyalgia: Secondary | ICD-10-CM | POA: Diagnosis not present

## 2021-12-22 DIAGNOSIS — A4151 Sepsis due to Escherichia coli [E. coli]: Secondary | ICD-10-CM

## 2021-12-22 DIAGNOSIS — Z872 Personal history of diseases of the skin and subcutaneous tissue: Secondary | ICD-10-CM

## 2021-12-22 DIAGNOSIS — R69 Illness, unspecified: Secondary | ICD-10-CM | POA: Diagnosis not present

## 2021-12-22 NOTE — Patient Instructions (Signed)
Standing Labs ?We placed an order today for your standing lab work.  ? ?Please have your standing labs drawn in May and every 3 months ? ?If possible, please have your labs drawn 2 weeks prior to your appointment so that the provider can discuss your results at your appointment. ? ?Please note that you may see your imaging and lab results in [facility] before we have reviewed them. ?We may be awaiting multiple results to interpret others before contacting you. ?Please allow our office up to 72 hours to thoroughly review all of the results before contacting the office for clarification of your results. ? ?We have open lab daily: ?Monday through Thursday from 1:30-4:30 PM and Friday from 1:30-4:00 PM ?at the office of Dr. Bo Merino, [facility].   ?Please be advised, all patients with office appointments requiring lab work will take precedent over walk-in lab work.  ?If possible, please come for your lab work on Monday and Friday afternoons, as you may experience shorter wait times. ?The office is located at [address], [place], [place], [place] [address] ?No appointment is necessary.   ?Labs are drawn by [facility]. Please bring your co-pay at the time of your lab draw.  You may receive a bill from [facility] for your lab work. ? ?Please note if you are on Hydroxychloroquine and and an order has been placed for a Hydroxychloroquine level, you will need to have it drawn 4 hours or more after your last dose. ? ?If you wish to have your labs drawn at another location, please call the office 24 hours in advance to send orders. ? ?If you have any questions regarding directions or hours of operation,  ?please call [phone].   ?As a reminder, please drink plenty of water prior to coming for your lab work. Thanks!  ? ?Vaccines ?You are taking a medication(s) that can suppress your immune system.  The following immunizations are recommended: ?Flu annually ?Covid-19  ?Td/Tdap (tetanus, diphtheria, pertussis)  every 10 years ?Pneumonia (Prevnar 15 then Pneumovax 23 at least 1 year apart.  Alternatively, can take Prevnar 20 without needing additional dose) ?Shingrix: 2 doses from 4 weeks to 6 months apart ? ?Please check with your PCP to make sure you are up to date.  ? ?If you have signs or symptoms of an infection or start antibiotics: ?First, call your PCP for workup of your infection. ?Hold your medication through the infection, until you complete your antibiotics, and until symptoms resolve if you take the following: ?Injectable medication (Actemra, Benlysta, Cimzia, Cosentyx, Enbrel, Humira, Kevzara, Orencia, Remicade, Simponi, [place], [place], [place]) ?Methotrexate ?Leflunomide Jolee Ewing) ?Mycophenolate (Cellcept) ?Roma Kayser, or Rinvoq  ?Please get annual skin examination to screen for skin cancer while you are on Humira. ?

## 2021-12-29 DIAGNOSIS — M79672 Pain in left foot: Secondary | ICD-10-CM | POA: Diagnosis not present

## 2021-12-29 DIAGNOSIS — M25512 Pain in left shoulder: Secondary | ICD-10-CM | POA: Diagnosis not present

## 2021-12-29 DIAGNOSIS — M25521 Pain in right elbow: Secondary | ICD-10-CM | POA: Diagnosis not present

## 2021-12-29 DIAGNOSIS — M542 Cervicalgia: Secondary | ICD-10-CM | POA: Diagnosis not present

## 2021-12-29 DIAGNOSIS — Z79891 Long term (current) use of opiate analgesic: Secondary | ICD-10-CM | POA: Diagnosis not present

## 2021-12-29 DIAGNOSIS — M1712 Unilateral primary osteoarthritis, left knee: Secondary | ICD-10-CM | POA: Diagnosis not present

## 2021-12-29 DIAGNOSIS — M545 Low back pain, unspecified: Secondary | ICD-10-CM | POA: Diagnosis not present

## 2021-12-29 DIAGNOSIS — G894 Chronic pain syndrome: Secondary | ICD-10-CM | POA: Diagnosis not present

## 2021-12-29 DIAGNOSIS — M79604 Pain in right leg: Secondary | ICD-10-CM | POA: Diagnosis not present

## 2021-12-29 DIAGNOSIS — I10 Essential (primary) hypertension: Secondary | ICD-10-CM | POA: Diagnosis not present

## 2021-12-29 DIAGNOSIS — M79605 Pain in left leg: Secondary | ICD-10-CM | POA: Diagnosis not present

## 2021-12-30 DIAGNOSIS — E119 Type 2 diabetes mellitus without complications: Secondary | ICD-10-CM | POA: Diagnosis not present

## 2021-12-30 DIAGNOSIS — Z1211 Encounter for screening for malignant neoplasm of colon: Secondary | ICD-10-CM | POA: Diagnosis not present

## 2021-12-30 DIAGNOSIS — I10 Essential (primary) hypertension: Secondary | ICD-10-CM | POA: Diagnosis not present

## 2022-01-14 ENCOUNTER — Other Ambulatory Visit: Payer: Self-pay | Admitting: Nurse Practitioner

## 2022-01-14 DIAGNOSIS — E782 Mixed hyperlipidemia: Secondary | ICD-10-CM

## 2022-01-26 DIAGNOSIS — M25561 Pain in right knee: Secondary | ICD-10-CM | POA: Diagnosis not present

## 2022-01-26 DIAGNOSIS — M79672 Pain in left foot: Secondary | ICD-10-CM | POA: Diagnosis not present

## 2022-01-26 DIAGNOSIS — M542 Cervicalgia: Secondary | ICD-10-CM | POA: Diagnosis not present

## 2022-01-26 DIAGNOSIS — G894 Chronic pain syndrome: Secondary | ICD-10-CM | POA: Diagnosis not present

## 2022-01-26 DIAGNOSIS — M25512 Pain in left shoulder: Secondary | ICD-10-CM | POA: Diagnosis not present

## 2022-01-26 DIAGNOSIS — M25521 Pain in right elbow: Secondary | ICD-10-CM | POA: Diagnosis not present

## 2022-01-26 DIAGNOSIS — M79671 Pain in right foot: Secondary | ICD-10-CM | POA: Diagnosis not present

## 2022-01-26 DIAGNOSIS — M79605 Pain in left leg: Secondary | ICD-10-CM | POA: Diagnosis not present

## 2022-01-26 DIAGNOSIS — M25571 Pain in right ankle and joints of right foot: Secondary | ICD-10-CM | POA: Diagnosis not present

## 2022-01-26 DIAGNOSIS — Z79891 Long term (current) use of opiate analgesic: Secondary | ICD-10-CM | POA: Diagnosis not present

## 2022-01-26 DIAGNOSIS — M79604 Pain in right leg: Secondary | ICD-10-CM | POA: Diagnosis not present

## 2022-01-27 ENCOUNTER — Other Ambulatory Visit (HOSPITAL_COMMUNITY): Payer: Self-pay

## 2022-01-28 DIAGNOSIS — Z1211 Encounter for screening for malignant neoplasm of colon: Secondary | ICD-10-CM | POA: Diagnosis not present

## 2022-01-28 DIAGNOSIS — D125 Benign neoplasm of sigmoid colon: Secondary | ICD-10-CM | POA: Diagnosis not present

## 2022-01-28 DIAGNOSIS — K635 Polyp of colon: Secondary | ICD-10-CM | POA: Diagnosis not present

## 2022-01-28 DIAGNOSIS — K6389 Other specified diseases of intestine: Secondary | ICD-10-CM | POA: Diagnosis not present

## 2022-01-28 LAB — HM COLONOSCOPY

## 2022-02-26 DIAGNOSIS — M542 Cervicalgia: Secondary | ICD-10-CM | POA: Diagnosis not present

## 2022-02-26 DIAGNOSIS — M25561 Pain in right knee: Secondary | ICD-10-CM | POA: Diagnosis not present

## 2022-02-26 DIAGNOSIS — M25521 Pain in right elbow: Secondary | ICD-10-CM | POA: Diagnosis not present

## 2022-02-26 DIAGNOSIS — G894 Chronic pain syndrome: Secondary | ICD-10-CM | POA: Diagnosis not present

## 2022-02-26 DIAGNOSIS — M79672 Pain in left foot: Secondary | ICD-10-CM | POA: Diagnosis not present

## 2022-02-26 DIAGNOSIS — M79671 Pain in right foot: Secondary | ICD-10-CM | POA: Diagnosis not present

## 2022-02-26 DIAGNOSIS — M25571 Pain in right ankle and joints of right foot: Secondary | ICD-10-CM | POA: Diagnosis not present

## 2022-02-26 DIAGNOSIS — M79605 Pain in left leg: Secondary | ICD-10-CM | POA: Diagnosis not present

## 2022-02-26 DIAGNOSIS — M79604 Pain in right leg: Secondary | ICD-10-CM | POA: Diagnosis not present

## 2022-02-26 DIAGNOSIS — M1712 Unilateral primary osteoarthritis, left knee: Secondary | ICD-10-CM | POA: Diagnosis not present

## 2022-02-26 DIAGNOSIS — M25512 Pain in left shoulder: Secondary | ICD-10-CM | POA: Diagnosis not present

## 2022-02-26 DIAGNOSIS — Z79891 Long term (current) use of opiate analgesic: Secondary | ICD-10-CM | POA: Diagnosis not present

## 2022-02-26 DIAGNOSIS — I10 Essential (primary) hypertension: Secondary | ICD-10-CM | POA: Diagnosis not present

## 2022-03-10 NOTE — Progress Notes (Deleted)
Office Visit Note  Patient: Shelby Robertson             Date of Birth: 1970/10/29           MRN: [id number]             PCP: Minette Brine, FNP Referring: Minette Brine, FNP Visit Date: 03/24/2022 Occupation: '@GUAROCC'$ @  Subjective:    History of Present Illness: Shelby Robertson is a 51 y.o. female with history of seronegative rheumatoid arthritis, HS, and fibromyalgia.  She remains on Humira 40 mg subcutaneous injections every 14 days, methotrexate 0.8 mL sq injections once weekly, and folic acid 2 mg daily.  CBC and CMP updated on 12/08/21.  TB gold negative on 07/28/21.  Discussed the importance of holding humira and MTX if she develops signs or symptoms of an infection and to resume once the infection has completely cleared.   Activities of Daily Living:  Patient reports morning stiffness for *** {minute/hour:19697}.   Patient {ACTIONS;DENIES/REPORTS:21021675::"Denies"} nocturnal pain.  Difficulty dressing/grooming: {ACTIONS;DENIES/REPORTS:21021675::"Denies"} Difficulty climbing stairs: {ACTIONS;DENIES/REPORTS:21021675::"Denies"} Difficulty getting out of chair: {ACTIONS;DENIES/REPORTS:21021675::"Denies"} Difficulty using hands for taps, buttons, cutlery, and/or writing: {ACTIONS;DENIES/REPORTS:21021675::"Denies"}  No Rheumatology ROS completed.   PMFS History:  Patient Active Problem List   Diagnosis Date Noted   Sepsis due to Escherichia coli (E. coli) ([place]) 11/28/2021   Pyelonephritis 11/26/2021   Hematuria 11/26/2021   Tachycardia 11/26/2021   Controlled type 2 diabetes mellitus with complication, without long-term current use of insulin ([place]) 10/14/2020   Sepsis ([place]) 09/17/2019   AMS (altered mental status) 09/17/2019   Morbid obesity ([place]) 01/10/2019   Prediabetes 01/10/2019   Tobacco use disorder, continuous 01/10/2019   Soft tissue swelling of back 01/10/2019   Fibromyalgia 07/27/2017   Other fatigue 07/27/2017   History of hidradenitis suppurativa 07/27/2017    History of hypertension 07/27/2017   History of hyperlipidemia 07/27/2017   Smoker 07/27/2017   High risk medication use 08/21/2016   Rheumatoid arthritis of multiple sites without rheumatoid factor ([place]) 08/12/2016   ARTHRITIS, GENERALIZED 07/14/2010   HYPERTENSION, BENIGN ESSENTIAL 03/04/2009   POLYURIA 03/04/2009   FIBROMYALGIA 11/09/2007   HIDRADENITIS SUPPURATIVA 07/11/2007   Mixed hyperlipidemia 06/28/2007   LEG EDEMA, BILATERAL 05/05/2007    Past Medical History:  Diagnosis Date   Diabetes mellitus without complication ([place])    Fibromyalgia    Fibromyalgia    Hypertension    Osteoarthritis    Rheumatoid arthritis(714.0)    Rheumatoid arthritis(714.0)     Family History  Problem Relation Age of Onset   Diabetes Mother    Healthy Father    Heart attack Maternal Grandmother    Stroke Maternal Grandmother    No past surgical history on file. Social History   Social History Narrative   Not on file   Immunization History  Administered Date(s) Administered   Influenza,inj,Quad PF,6+ Mos 09/18/2019, 10/14/2020, 07/15/2021   Influenza-Unspecified 07/18/2014   PFIZER(Purple Top)SARS-COV-2 Vaccination 02/11/2020, 03/03/2020   Td 11/11/2008     Objective: Vital Signs: There were no vitals taken for this visit.   Physical Exam Vitals and nursing note reviewed.  Constitutional:      Appearance: She is well-developed.  HENT:     Head: Normocephalic and atraumatic.  Eyes:     Conjunctiva/sclera: Conjunctivae normal.  Cardiovascular:     Rate and Rhythm: Normal rate and regular rhythm.     Heart sounds: Normal heart sounds.  Pulmonary:     Effort: Pulmonary effort is normal.     Breath sounds:  Normal breath sounds.  Abdominal:     General: Bowel sounds are normal.     Palpations: Abdomen is soft.  Musculoskeletal:     Cervical back: Normal range of motion.  Skin:    General: Skin is warm and dry.     Capillary Refill: Capillary refill takes less than 2  seconds.  Neurological:     Mental Status: She is alert and oriented to person, place, and time.  Psychiatric:        Behavior: Behavior normal.     Musculoskeletal Exam: ***  CDAI Exam: CDAI Score: -- Patient Global: --; Provider Global: -- Swollen: --; Tender: -- Joint Exam 03/24/2022   No joint exam has been documented for this visit   There is currently no information documented on the homunculus. Go to the Rheumatology activity and complete the homunculus joint exam.  Investigation: No additional findings.  Imaging: No results found.  Recent Labs: Lab Results  Component Value Date   WBC 9.6 12/08/2021   HGB 13.1 12/08/2021   PLT 484 (H) 12/08/2021   NA 139 12/08/2021   K 3.9 12/08/2021   CL 100 12/08/2021   CO2 26 12/08/2021   GLUCOSE 107 (H) 12/08/2021   BUN 12 12/08/2021   CREATININE 0.75 12/08/2021   BILITOT <0.2 12/08/2021   ALKPHOS 91 12/08/2021   AST 5 12/08/2021   ALT 8 12/08/2021   PROT 7.7 12/08/2021   ALBUMIN 3.9 12/08/2021   CALCIUM 9.6 12/08/2021   GFRAA 128 02/12/2021   QFTBGOLDPLUS NEGATIVE 07/28/2021    Speciality Comments: No specialty comments available.  Procedures:  No procedures performed Allergies: Patient has no known allergies.   Assessment / Plan:     Visit Diagnoses: Rheumatoid arthritis of multiple sites without rheumatoid factor (HCC)  High risk medication use - Humira 40 mg subcutaneous injections every 14 days, methotrexate 0.8 mL sq injections once weekly, and folic acid 2 mg daily.  Primary osteoarthritis of both knees  History of hidradenitis suppurativa  Fibromyalgia  Trapezius muscle spasm  Other fatigue  History of hyperlipidemia  History of hypertension  Orders: No orders of the defined types were placed in this encounter.  No orders of the defined types were placed in this encounter.   Face-to-face time spent with patient was *** minutes. Greater than 50% of time was spent in counseling and  coordination of care.  Follow-Up Instructions: No follow-ups on file.   Ofilia Neas, PA-C  Note - This record has been created using Dragon software.  Chart creation errors have been sought, but may not always  have been located. Such creation errors do not reflect on  the standard of medical care.

## 2022-03-13 ENCOUNTER — Ambulatory Visit (HOSPITAL_COMMUNITY)
Admission: EM | Admit: 2022-03-13 | Discharge: 2022-03-13 | Disposition: A | Discharge status: Home / Self Care | Payer: Medicare HMO | Attending: Physician Assistant | Admitting: Physician Assistant

## 2022-03-13 ENCOUNTER — Encounter (HOSPITAL_COMMUNITY): Payer: Self-pay

## 2022-03-13 DIAGNOSIS — R21 Rash and other nonspecific skin eruption: Secondary | ICD-10-CM

## 2022-03-13 DIAGNOSIS — L209 Atopic dermatitis, unspecified: Secondary | ICD-10-CM | POA: Diagnosis not present

## 2022-03-13 DIAGNOSIS — L299 Pruritus, unspecified: Secondary | ICD-10-CM

## 2022-03-13 DIAGNOSIS — L309 Dermatitis, unspecified: Secondary | ICD-10-CM

## 2022-03-13 DIAGNOSIS — L308 Other specified dermatitis: Secondary | ICD-10-CM

## 2022-03-13 HISTORY — DX: Dermatitis, unspecified: L30.9

## 2022-03-13 MED ORDER — CLOBETASOL PROPIONATE 0.05 % EX SHAM: MEDICATED_SHAMPOO | CUTANEOUS | 0 refills | Status: DC

## 2022-03-13 MED ORDER — CETIRIZINE HCL 10 MG PO TABS: 10.0000 mg | ORAL_TABLET | Freq: Every day | ORAL | 0 refills | Status: AC

## 2022-03-13 MED ORDER — HYDROCORTISONE 1 % EX CREA: TOPICAL_CREAM | CUTANEOUS | 0 refills | Status: DC

## 2022-03-13 NOTE — ED Triage Notes (Signed)
Pt states rash to forehead and face since this morning. States he has eczema on her scalp that has been itching for over a week.  Pt has been taking Benadryl with no relief.

## 2022-03-13 NOTE — ED Provider Notes (Signed)
[facility]    CSN: [id number] Arrival date & time: 03/13/22  1204      History   Chief Complaint Chief Complaint  Patient presents with   Rash    HPI Shelby Robertson is a 51 y.o. female.   51 year old female presents with itching of the scalp.  Patient relates that she has eczematous dermatitis of the scalp, she has intermittent episodes of exacerbations from this condition.  This is a an exacerbation of her eczematous dermatitis which is uncontrolled at present time.  Patient relates that she is using Benadryl over-the-counter to help reduce the itching, but it is giving minimal relief.  Patient relates for the past several days she has had extreme itching of the scalp particularly the frontal part of the scalp.  She relates that her along the forehead and the scalp line she started getting small bumps that was appearing, with intense itching.  Patient relates that she has noticed over the past 24 hours that the itching is all over the scalp and forehead and the upper sides of the face.  Patient has used over-the-counter type of creams to help reduce the itching but this may have aggravated the condition.  Patient relates she is not having any fever or chills, and there is no drainage from areas on the scalp.  Patient is using a hard brush to brush her hair and to satisfy some of the itching.   Rash  Past Medical History:  Diagnosis Date   Diabetes mellitus without complication (HCC)    Eczema    Fibromyalgia    Fibromyalgia    Hypertension    Osteoarthritis    Rheumatoid arthritis(714.0)    Rheumatoid arthritis(714.0)     Patient Active Problem List   Diagnosis Date Noted   Eczema 03/13/2022   Sepsis due to Escherichia coli (E. coli) ([place]) 11/28/2021   Pyelonephritis 11/26/2021   Hematuria 11/26/2021   Tachycardia 11/26/2021   Controlled type 2 diabetes mellitus with complication, without long-term current use of insulin ([place]) 10/14/2020   Sepsis ([place])  09/17/2019   AMS (altered mental status) 09/17/2019   Morbid obesity ([place]) 01/10/2019   Prediabetes 01/10/2019   Tobacco use disorder, continuous 01/10/2019   Soft tissue swelling of back 01/10/2019   Fibromyalgia 07/27/2017   Other fatigue 07/27/2017   History of hidradenitis suppurativa 07/27/2017   History of hypertension 07/27/2017   History of hyperlipidemia 07/27/2017   Smoker 07/27/2017   High risk medication use 08/21/2016   Rheumatoid arthritis of multiple sites without rheumatoid factor ([place]) 08/12/2016   ARTHRITIS, GENERALIZED 07/14/2010   HYPERTENSION, BENIGN ESSENTIAL 03/04/2009   POLYURIA 03/04/2009   FIBROMYALGIA 11/09/2007   HIDRADENITIS SUPPURATIVA 07/11/2007   Mixed hyperlipidemia 06/28/2007   LEG EDEMA, BILATERAL 05/05/2007    History reviewed. No pertinent surgical history.  OB History   No obstetric history on file.      Home Medications    Prior to Admission medications   Medication Sig Start Date End Date Taking? Authorizing Provider  cetirizine (ZYRTEC ALLERGY) 10 MG tablet Take 1 tablet (10 mg total) by mouth daily. 03/13/22  Yes Nyoka Lint, PA-C  Clobetasol Propionate 0.05 % shampoo Apply onto dry scalp once daily, leave on 10 15 minutes and then rinse and shampoo off. 03/13/22  Yes Nyoka Lint, PA-C  hydrocortisone cream 1 % Apply to affected area 2 times daily 03/13/22  Yes Nyoka Lint, PA-C  acetaminophen (TYLENOL) 325 MG tablet Take 2 tablets (650 mg total) by mouth  every 6 (six) hours as needed for mild pain (or Fever >/= 101). 11/30/21   Elgergawy, Silver Huguenin, MD  Adalimumab (HUMIRA PEN) 40 MG/0.8ML PNKT INJECT 1 PEN INTO THE SKIN EVERY 14 DAYS Patient taking differently: Inject 40 mg into the skin every 14 (fourteen) days. 11/11/21 11/11/22  Bo Merino, MD  albuterol (PROVENTIL) (2.5 MG/3ML) 0.083% nebulizer solution Take 3 mLs (2.5 mg total) by nebulization every 6 (six) hours as needed for wheezing or shortness of breath. 07/13/21   Minette Brine, FNP  albuterol (VENTOLIN HFA) 108 (90 Base) MCG/ACT inhaler Inhale 2 puffs into the lungs every 4 (four) hours as needed for wheezing or shortness of breath (cough, shortness of breath or wheezing.). 10/11/19   Robyn Haber, MD  amLODipine (NORVASC) 10 MG tablet Take 1 tablet (10 mg total) by mouth daily. 12/08/21   Minette Brine, FNP  aspirin 325 MG tablet Take 325 mg by mouth every 6 (six) hours as needed (pain).    [provider]  atorvastatin (LIPITOR) 20 MG tablet Take 1 tablet (20 mg total) by mouth daily. 01/14/22   Minette Brine, FNP  B-D TB SYRINGE 1CC/27GX1/2" 27G X 1/2" 1 ML MISC USE 1 SYRINGE TO INJECT METHOTREXATE INTO THE SKIN ONCE A WEEK. 07/23/19   Bo Merino, MD  blood glucose meter kit and supplies KIT Dispense based on patient and insurance preference. Use up to two times daily as directed. (FOR ICD-9 250.00, 250.01). 01/17/20   Minette Brine, FNP  cefadroxil (DURICEF) 1 g tablet Take 1 tablet (1 g total) by mouth 2 (two) times daily. 12/08/21   Minette Brine, FNP  diphenhydrAMINE (BENADRYL) 25 MG tablet Take 25 mg by mouth every 6 (six) hours as needed for itching, allergies or sleep.    [provider]  DULoxetine (CYMBALTA) 60 MG capsule Take 60 mg by mouth daily.    [provider]  ezetimibe (ZETIA) 10 MG tablet TAKE 1 TABLET BY MOUTH EVERY DAY Patient taking differently: Take 10 mg by mouth daily. 03/27/21   Minette Brine, FNP  hydrochlorothiazide (HYDRODIURIL) 12.5 MG tablet Take 1 tablet (12.5 mg total) by mouth daily as needed (fluid/edema). 12/05/21   Elgergawy, Silver Huguenin, MD  HYDROcodone-acetaminophen (NORCO) 10-325 MG tablet Take 1 tablet by mouth every 6 (six) hours as needed for moderate pain. 08/20/19   [provider]  hydrOXYzine (ATARAX/VISTARIL) 25 MG tablet Take 1 tablet (25 mg total) by mouth every 8 (eight) hours as needed for itching. 05/15/20   Lamptey, Myrene Galas, MD  Lancets (ONETOUCH DELICA PLUS [id number]) MISC 2  (two) times daily. use for testing 01/17/20   [provider]  metFORMIN (GLUCOPHAGE) 500 MG tablet TAKE 1 TABLET BY MOUTH 2 TIMES DAILY WITH A MEAL. Patient taking differently: Take 500 mg by mouth 2 (two) times daily with a meal. 10/23/21   Minette Brine, FNP  methotrexate 50 MG/2ML injection Inject 0.8 mls (20 mg total) into the skin weekly. 07/28/21   Ofilia Neas, PA-C  metoprolol tartrate (LOPRESSOR) 50 MG tablet Take 1 tablet (50 mg total) by mouth daily. Please schedule appointment for refills. 12/08/21   Minette Brine, FNP  [facility] VERIO test strip TEST TWICE DAILY 02/11/20   Minette Brine, FNP  tiZANidine (ZANAFLEX) 4 MG capsule Take 4 mg by mouth every 6 (six) hours. 10/15/19   [provider]  triamcinolone ointment (KENALOG) 0.1 % Apply 1 application topically daily as needed (rash, itching). 09/30/20   [provider]  Vitamin  D, Ergocalciferol, (DRISDOL) 1.25 MG (50000 UNIT) CAPS capsule TAKE 1 CAPSULE BY MOUTH ON TUES AND ONE ON FRIDAYS. Patient taking differently: Take 50,000 Units by mouth 2 (two) times a week. Take 1 capsule by mouth on Tues and one on Fridays. 10/05/21   Bary Castilla, NP    Family History Family History  Problem Relation Age of Onset   Diabetes Mother    Healthy Father    Heart attack Maternal Grandmother    Stroke Maternal Grandmother     Social History Social History   Tobacco Use   Smoking status: Every Day    Packs/day: 0.50    Years: 20.00    Pack years: 10.00    Types: Cigarettes    Passive exposure: Never   Smokeless tobacco: Never  Vaping Use   Vaping Use: Never used  Substance Use Topics   Alcohol use: Not Currently   Drug use: Not Currently    Types: Hydrocodone     Allergies   Patient has no known allergies.   Review of Systems Review of Systems  Skin:  Positive for rash (scalp and forehead).    Physical Exam Triage Vital Signs ED Triage Vitals [03/13/22 1303]  Enc Vitals Group     BP  (!) 177/104     Pulse Rate (!) 112     Resp 18     Temp 98.2 F (36.8 C)     Temp Source Oral     SpO2 95 %     Weight      Height      Head Circumference      Peak Flow      Pain Score 0     Pain Loc      Pain Edu?      Excl. in [place]?    No data found.  Updated Vital Signs BP (!) 177/104 (BP Location: Right Arm)   Pulse (!) 112   Temp 98.2 F (36.8 C) (Oral)   Resp 18   SpO2 95%   Visual Acuity Right Eye Distance:   Left Eye Distance:   Bilateral Distance:    Right Eye Near:   Left Eye Near:    Bilateral Near:     Physical Exam Constitutional:      Appearance: Normal appearance.  Skin:    Findings: Rash present. Rash is papular (along forehead and scalp area).     Comments: Scalp: Along the front of the scalp line in the forehead there is a diffuse papular eruption which is present and extends down into the temporal areas bilaterally, minimal redness, no drainage.  The scalp contains mild redness and slightly scaling areas. The rest of the habitus is clear  Neurological:     Mental Status: She is alert.     UC Treatments / Results  Labs (all labs ordered are listed, but only abnormal results are displayed) Labs Reviewed - No data to display  EKG   Radiology No results found.  Procedures Procedures (including critical care time)  Medications Ordered in UC Medications - No data to display  Initial Impression / Assessment and Plan / UC Course  I have reviewed the triage vital signs and the nursing notes.  Pertinent labs & imaging results that were available during my care of the patient were reviewed by me and considered in my medical decision making (see chart for details).    Plan: 1.  Patient advised to use the Zyrtec to help reduce the itching. 2.  Patient advised to use Benadryl to supplement and provide relief from the itching. 3.  Patient advised to use the shampoo once daily to help reduce the scalp irritation. 4.  Patient advised to use  the mild cortisone cream to the anterior part of the scalp forehead to help reduce the itching. 5.  Patient is to follow-up with PCP or return to urgent care symptoms fail to improve. Final Clinical Impressions(s) / UC Diagnoses   Final diagnoses:  Rash  Other eczema  Itching  Atopic dermatitis of scalp     Discharge Instructions      Advised to use the shampoo as directed, patient improved on dry scalp leave on for 10 minutes, then rinse off shampoo and rinse.  This is done once daily. Advised to use a cortisone cream to the anterior part of the scalp to help reduce the itching and irritation. Advised to use Zyrtec and combination of Benadryl to help relieve the itching. Advised to follow-up with PCP for further evaluation.    ED Prescriptions     Medication Sig Dispense Auth. Provider   cetirizine (ZYRTEC ALLERGY) 10 MG tablet Take 1 tablet (10 mg total) by mouth daily. 30 tablet Nyoka Lint, PA-C   hydrocortisone cream 1 % Apply to affected area 2 times daily 15 g Nyoka Lint, PA-C   Clobetasol Propionate 0.05 % shampoo Apply onto dry scalp once daily, leave on 10 15 minutes and then rinse and shampoo off. 118 mL Nyoka Lint, PA-C      PDMP not reviewed this encounter.   Nyoka Lint, PA-C 03/13/22 1340

## 2022-03-13 NOTE — Discharge Instructions (Addendum)
Advised to use the shampoo as directed, patient improved on dry scalp leave on for 10 minutes, then rinse off shampoo and rinse.  This is done once daily. Advised to use a cortisone cream to the anterior part of the scalp to help reduce the itching and irritation. Advised to use Zyrtec and combination of Benadryl to help relieve the itching. Advised to follow-up with PCP for further evaluation.

## 2022-03-24 ENCOUNTER — Ambulatory Visit: Payer: Medicare HMO | Admitting: Physician Assistant

## 2022-03-24 DIAGNOSIS — M62838 Other muscle spasm: Secondary | ICD-10-CM

## 2022-03-24 DIAGNOSIS — Z872 Personal history of diseases of the skin and subcutaneous tissue: Secondary | ICD-10-CM

## 2022-03-24 DIAGNOSIS — M170 Bilateral primary osteoarthritis of knee: Secondary | ICD-10-CM

## 2022-03-24 DIAGNOSIS — R5383 Other fatigue: Secondary | ICD-10-CM

## 2022-03-24 DIAGNOSIS — Z8679 Personal history of other diseases of the circulatory system: Secondary | ICD-10-CM

## 2022-03-24 DIAGNOSIS — M0609 Rheumatoid arthritis without rheumatoid factor, multiple sites: Secondary | ICD-10-CM

## 2022-03-24 DIAGNOSIS — M797 Fibromyalgia: Secondary | ICD-10-CM

## 2022-03-24 DIAGNOSIS — Z8639 Personal history of other endocrine, nutritional and metabolic disease: Secondary | ICD-10-CM

## 2022-03-24 DIAGNOSIS — Z79899 Other long term (current) drug therapy: Secondary | ICD-10-CM

## 2022-03-25 ENCOUNTER — Ambulatory Visit: Payer: Medicare HMO | Admitting: Physician Assistant

## 2022-03-31 ENCOUNTER — Other Ambulatory Visit (HOSPITAL_COMMUNITY): Payer: Self-pay

## 2022-03-31 ENCOUNTER — Ambulatory Visit: Payer: Medicare HMO | Admitting: Nurse Practitioner

## 2022-04-01 DIAGNOSIS — M25512 Pain in left shoulder: Secondary | ICD-10-CM | POA: Diagnosis not present

## 2022-04-01 DIAGNOSIS — Z79891 Long term (current) use of opiate analgesic: Secondary | ICD-10-CM | POA: Diagnosis not present

## 2022-04-01 DIAGNOSIS — M79604 Pain in right leg: Secondary | ICD-10-CM | POA: Diagnosis not present

## 2022-04-01 DIAGNOSIS — M1712 Unilateral primary osteoarthritis, left knee: Secondary | ICD-10-CM | POA: Diagnosis not present

## 2022-04-01 DIAGNOSIS — M79672 Pain in left foot: Secondary | ICD-10-CM | POA: Diagnosis not present

## 2022-04-01 DIAGNOSIS — M79605 Pain in left leg: Secondary | ICD-10-CM | POA: Diagnosis not present

## 2022-04-01 DIAGNOSIS — M545 Low back pain, unspecified: Secondary | ICD-10-CM | POA: Diagnosis not present

## 2022-04-01 DIAGNOSIS — M25571 Pain in right ankle and joints of right foot: Secondary | ICD-10-CM | POA: Diagnosis not present

## 2022-04-01 DIAGNOSIS — M542 Cervicalgia: Secondary | ICD-10-CM | POA: Diagnosis not present

## 2022-04-01 DIAGNOSIS — G894 Chronic pain syndrome: Secondary | ICD-10-CM | POA: Diagnosis not present

## 2022-04-01 DIAGNOSIS — I10 Essential (primary) hypertension: Secondary | ICD-10-CM | POA: Diagnosis not present

## 2022-04-01 DIAGNOSIS — M25521 Pain in right elbow: Secondary | ICD-10-CM | POA: Diagnosis not present

## 2022-04-01 DIAGNOSIS — M79671 Pain in right foot: Secondary | ICD-10-CM | POA: Diagnosis not present

## 2022-04-02 NOTE — Progress Notes (Deleted)
Office Visit Note  Patient: Shelby Robertson             Date of Birth: January 31, 1971           MRN: [id number]             PCP: Minette Brine, FNP Referring: Minette Brine, FNP Visit Date: 04/12/2022 Occupation: '@GUAROCC'$ @  Subjective:  No chief complaint on file.   History of Present Illness: Shelby Robertson is a 51 y.o. female ***   Activities of Daily Living:  Patient reports morning stiffness for *** {minute/hour:19697}.   Patient {ACTIONS;DENIES/REPORTS:21021675::"Denies"} nocturnal pain.  Difficulty dressing/grooming: {ACTIONS;DENIES/REPORTS:21021675::"Denies"} Difficulty climbing stairs: {ACTIONS;DENIES/REPORTS:21021675::"Denies"} Difficulty getting out of chair: {ACTIONS;DENIES/REPORTS:21021675::"Denies"} Difficulty using hands for taps, buttons, cutlery, and/or writing: {ACTIONS;DENIES/REPORTS:21021675::"Denies"}  No Rheumatology ROS completed.   PMFS History:  Patient Active Problem List   Diagnosis Date Noted  . Eczema 03/13/2022  . Sepsis due to Escherichia coli (E. coli) ([place]) 11/28/2021  . Pyelonephritis 11/26/2021  . Hematuria 11/26/2021  . Tachycardia 11/26/2021  . Controlled type 2 diabetes mellitus with complication, without long-term current use of insulin ([place]) 10/14/2020  . Sepsis ([place]) 09/17/2019  . AMS (altered mental status) 09/17/2019  . Morbid obesity ([place]) 01/10/2019  . Prediabetes 01/10/2019  . Tobacco use disorder, continuous 01/10/2019  . Soft tissue swelling of back 01/10/2019  . Fibromyalgia 07/27/2017  . Other fatigue 07/27/2017  . History of hidradenitis suppurativa 07/27/2017  . History of hypertension 07/27/2017  . History of hyperlipidemia 07/27/2017  . Smoker 07/27/2017  . High risk medication use 08/21/2016  . Rheumatoid arthritis of multiple sites without rheumatoid factor ([place]) 08/12/2016  . ARTHRITIS, GENERALIZED 07/14/2010  . HYPERTENSION, BENIGN ESSENTIAL 03/04/2009  . POLYURIA 03/04/2009  . FIBROMYALGIA 11/09/2007  .  HIDRADENITIS SUPPURATIVA 07/11/2007  . Mixed hyperlipidemia 06/28/2007  . LEG EDEMA, BILATERAL 05/05/2007    Past Medical History:  Diagnosis Date  . Diabetes mellitus without complication ([place])   . Eczema   . Fibromyalgia   . Fibromyalgia   . Hypertension   . Osteoarthritis   . Rheumatoid arthritis(714.0)   . Rheumatoid arthritis(714.0)     Family History  Problem Relation Age of Onset  . Diabetes Mother   . Healthy Father   . Heart attack Maternal Grandmother   . Stroke Maternal Grandmother    No past surgical history on file. Social History   Social History Narrative  . Not on file   Immunization History  Administered Date(s) Administered  . Influenza,inj,Quad PF,6+ Mos 09/18/2019, 10/14/2020, 07/15/2021  . Influenza-Unspecified 07/18/2014  . PFIZER(Purple Top)SARS-COV-2 Vaccination 02/11/2020, 03/03/2020  . Td 11/11/2008     Objective: Vital Signs: There were no vitals taken for this visit.   Physical Exam   Musculoskeletal Exam: ***  CDAI Exam: CDAI Score: -- Patient Global: --; Provider Global: -- Swollen: --; Tender: -- Joint Exam 04/12/2022   No joint exam has been documented for this visit   There is currently no information documented on the homunculus. Go to the Rheumatology activity and complete the homunculus joint exam.  Investigation: No additional findings.  Imaging: No results found.  Recent Labs: Lab Results  Component Value Date   WBC 9.6 12/08/2021   HGB 13.1 12/08/2021   PLT 484 (H) 12/08/2021   NA 139 12/08/2021   K 3.9 12/08/2021   CL 100 12/08/2021   CO2 26 12/08/2021   GLUCOSE 107 (H) 12/08/2021   BUN 12 12/08/2021   CREATININE 0.75 12/08/2021   BILITOT <0.2 12/08/2021  ALKPHOS 91 12/08/2021   AST 5 12/08/2021   ALT 8 12/08/2021   PROT 7.7 12/08/2021   ALBUMIN 3.9 12/08/2021   CALCIUM 9.6 12/08/2021   GFRAA 128 02/12/2021   QFTBGOLDPLUS NEGATIVE 07/28/2021    Speciality Comments: No specialty comments  available.  Procedures:  No procedures performed Allergies: Patient has no known allergies.   Assessment / Plan:     Visit Diagnoses: Rheumatoid arthritis of multiple sites without rheumatoid factor (HCC)  High risk medication use  Primary osteoarthritis of both knees  History of hidradenitis suppurativa  Fibromyalgia  Trapezius muscle spasm  Other fatigue  History of hyperlipidemia  History of hypertension  Smoker  Sepsis due to Escherichia coli, unspecified whether acute organ dysfunction present ([place])  Orders: No orders of the defined types were placed in this encounter.  No orders of the defined types were placed in this encounter.   Face-to-face time spent with patient was *** minutes. Greater than 50% of time was spent in counseling and coordination of care.  Follow-Up Instructions: No follow-ups on file.   Ofilia Neas, PA-C  Note - This record has been created using Dragon software.  Chart creation errors have been sought, but may not always  have been located. Such creation errors do not reflect on  the standard of medical care.

## 2022-04-07 DIAGNOSIS — L732 Hidradenitis suppurativa: Secondary | ICD-10-CM | POA: Diagnosis not present

## 2022-04-12 ENCOUNTER — Ambulatory Visit: Payer: Medicare HMO | Admitting: Physician Assistant

## 2022-04-12 ENCOUNTER — Other Ambulatory Visit: Payer: Self-pay | Admitting: *Deleted

## 2022-04-12 DIAGNOSIS — Z79899 Other long term (current) drug therapy: Secondary | ICD-10-CM

## 2022-04-12 DIAGNOSIS — M797 Fibromyalgia: Secondary | ICD-10-CM

## 2022-04-12 DIAGNOSIS — M0609 Rheumatoid arthritis without rheumatoid factor, multiple sites: Secondary | ICD-10-CM

## 2022-04-12 DIAGNOSIS — Z8639 Personal history of other endocrine, nutritional and metabolic disease: Secondary | ICD-10-CM

## 2022-04-12 DIAGNOSIS — Z8679 Personal history of other diseases of the circulatory system: Secondary | ICD-10-CM

## 2022-04-12 DIAGNOSIS — F17200 Nicotine dependence, unspecified, uncomplicated: Secondary | ICD-10-CM

## 2022-04-12 DIAGNOSIS — M170 Bilateral primary osteoarthritis of knee: Secondary | ICD-10-CM

## 2022-04-12 DIAGNOSIS — R5383 Other fatigue: Secondary | ICD-10-CM

## 2022-04-12 DIAGNOSIS — A4151 Sepsis due to Escherichia coli [E. coli]: Secondary | ICD-10-CM

## 2022-04-12 DIAGNOSIS — Z872 Personal history of diseases of the skin and subcutaneous tissue: Secondary | ICD-10-CM

## 2022-04-12 DIAGNOSIS — M62838 Other muscle spasm: Secondary | ICD-10-CM

## 2022-04-13 ENCOUNTER — Ambulatory Visit: Payer: Medicare HMO | Admitting: Nurse Practitioner

## 2022-04-13 LAB — CBC WITH DIFFERENTIAL/PLATELET
Absolute Monocytes: 208 cells/uL (ref 200–950)
Basophils Absolute: 40 cells/uL (ref 0–200)
Basophils Relative: 0.6 %
Eosinophils Absolute: 127 cells/uL (ref 15–500)
Eosinophils Relative: 1.9 %
HCT: 39.9 % (ref 35.0–45.0)
Hemoglobin: 13.2 g/dL (ref 11.7–15.5)
Lymphs Abs: 2915 cells/uL (ref 850–3900)
MCH: 29.3 pg (ref 27.0–33.0)
MCHC: 33.1 g/dL (ref 32.0–36.0)
MCV: 88.7 fL (ref 80.0–100.0)
MPV: 9.1 fL (ref 7.5–12.5)
Monocytes Relative: 3.1 %
Neutro Abs: 3410 cells/uL (ref 1500–7800)
Neutrophils Relative %: 50.9 %
Platelets: 355 10*3/uL (ref 140–400)
RBC: 4.5 10*6/uL (ref 3.80–5.10)
RDW: 16 % — ABNORMAL HIGH (ref 11.0–15.0)
Total Lymphocyte: 43.5 %
WBC: 6.7 10*3/uL (ref 3.8–10.8)

## 2022-04-13 LAB — COMPLETE METABOLIC PANEL WITH GFR
AG Ratio: 1.1 (calc) (ref 1.0–2.5)
ALT: 15 U/L (ref 6–29)
AST: 16 U/L (ref 10–35)
Albumin: 3.8 g/dL (ref 3.6–5.1)
Alkaline phosphatase (APISO): 73 U/L (ref 31–125)
BUN: 11 mg/dL (ref 7–25)
CO2: 25 mmol/L (ref 20–32)
Calcium: 9.2 mg/dL (ref 8.6–10.2)
Chloride: 106 mmol/L (ref 98–110)
Creat: 0.76 mg/dL (ref 0.50–0.99)
Globulin: 3.4 g/dL (calc) (ref 1.9–3.7)
Glucose, Bld: 122 mg/dL — ABNORMAL HIGH (ref 65–99)
Potassium: 3.6 mmol/L (ref 3.5–5.3)
Sodium: 139 mmol/L (ref 135–146)
Total Bilirubin: 0.3 mg/dL (ref 0.2–1.2)
Total Protein: 7.2 g/dL (ref 6.1–8.1)
eGFR: 96 mL/min/{1.73_m2} (ref >=60)

## 2022-04-21 NOTE — Progress Notes (Deleted)
Office Visit Note  Patient: Shelby Robertson             Date of Birth: 11/21/1970           MRN: [id number]             PCP: Minette Brine, FNP Referring: Minette Brine, FNP Visit Date: 04/27/2022 Occupation: '@GUAROCC'$ @  Subjective:    History of Present Illness: Shelby Robertson is a 51 y.o. female with history of seronegative rheumatoid arthritis, fibromyalgia, and osteoarthritis.  She remains on Humira 40 mg subcutaneous injections every 14 days, methotrexate 0.8 mL sq injections once weekly, and folic acid 2 mg daily.  CBC and CMP updated on 04/11/22.  Her next lab work will be due in September and every 3 months to monitor for drug toxicity.  TB gold negative on 07/28/21.  Discussed the importance of holding humira and methotrexate if she develops signs or symptoms of an infection and to resume once the infection has completely cleared.   Activities of Daily Living:  Patient reports morning stiffness for *** {minute/hour:19697}.   Patient {ACTIONS;DENIES/REPORTS:21021675::"Denies"} nocturnal pain.  Difficulty dressing/grooming: {ACTIONS;DENIES/REPORTS:21021675::"Denies"} Difficulty climbing stairs: {ACTIONS;DENIES/REPORTS:21021675::"Denies"} Difficulty getting out of chair: {ACTIONS;DENIES/REPORTS:21021675::"Denies"} Difficulty using hands for taps, buttons, cutlery, and/or writing: {ACTIONS;DENIES/REPORTS:21021675::"Denies"}  No Rheumatology ROS completed.   PMFS History:  Patient Active Problem List   Diagnosis Date Noted   Eczema 03/13/2022   Sepsis due to Escherichia coli (E. coli) ([place]) 11/28/2021   Pyelonephritis 11/26/2021   Hematuria 11/26/2021   Tachycardia 11/26/2021   Controlled type 2 diabetes mellitus with complication, without long-term current use of insulin ([place]) 10/14/2020   Sepsis ([place]) 09/17/2019   AMS (altered mental status) 09/17/2019   Morbid obesity ([place]) 01/10/2019   Prediabetes 01/10/2019   Tobacco use disorder, continuous 01/10/2019   Soft  tissue swelling of back 01/10/2019   Fibromyalgia 07/27/2017   Other fatigue 07/27/2017   History of hidradenitis suppurativa 07/27/2017   History of hypertension 07/27/2017   History of hyperlipidemia 07/27/2017   Smoker 07/27/2017   High risk medication use 08/21/2016   Rheumatoid arthritis of multiple sites without rheumatoid factor ([place]) 08/12/2016   ARTHRITIS, GENERALIZED 07/14/2010   HYPERTENSION, BENIGN ESSENTIAL 03/04/2009   POLYURIA 03/04/2009   FIBROMYALGIA 11/09/2007   HIDRADENITIS SUPPURATIVA 07/11/2007   Mixed hyperlipidemia 06/28/2007   LEG EDEMA, BILATERAL 05/05/2007    Past Medical History:  Diagnosis Date   Diabetes mellitus without complication ([place])    Eczema    Fibromyalgia    Fibromyalgia    Hypertension    Osteoarthritis    Rheumatoid arthritis(714.0)    Rheumatoid arthritis(714.0)     Family History  Problem Relation Age of Onset   Diabetes Mother    Healthy Father    Heart attack Maternal Grandmother    Stroke Maternal Grandmother    No past surgical history on file. Social History   Social History Narrative   Not on file   Immunization History  Administered Date(s) Administered   Influenza,inj,Quad PF,6+ Mos 09/18/2019, 10/14/2020, 07/15/2021   Influenza-Unspecified 07/18/2014   PFIZER(Purple Top)SARS-COV-2 Vaccination 02/11/2020, 03/03/2020   Td 11/11/2008     Objective: Vital Signs: There were no vitals taken for this visit.   Physical Exam Vitals and nursing note reviewed.  Constitutional:      Appearance: She is well-developed.  HENT:     Head: Normocephalic and atraumatic.  Eyes:     Conjunctiva/sclera: Conjunctivae normal.  Cardiovascular:     Rate and Rhythm: Normal rate and regular  rhythm.     Heart sounds: Normal heart sounds.  Pulmonary:     Effort: Pulmonary effort is normal.     Breath sounds: Normal breath sounds.  Abdominal:     General: Bowel sounds are normal.     Palpations: Abdomen is soft.   Musculoskeletal:     Cervical back: Normal range of motion.  Skin:    General: Skin is warm and dry.     Capillary Refill: Capillary refill takes less than 2 seconds.  Neurological:     Mental Status: She is alert and oriented to person, place, and time.  Psychiatric:        Behavior: Behavior normal.      Musculoskeletal Exam: ***  CDAI Exam: CDAI Score: -- Patient Global: --; Provider Global: -- Swollen: --; Tender: -- Joint Exam 04/27/2022   No joint exam has been documented for this visit   There is currently no information documented on the homunculus. Go to the Rheumatology activity and complete the homunculus joint exam.  Investigation: No additional findings.  Imaging: No results found.  Recent Labs: Lab Results  Component Value Date   WBC 6.7 04/12/2022   HGB 13.2 04/12/2022   PLT 355 04/12/2022   NA 139 04/12/2022   K 3.6 04/12/2022   CL 106 04/12/2022   CO2 25 04/12/2022   GLUCOSE 122 (H) 04/12/2022   BUN 11 04/12/2022   CREATININE 0.76 04/12/2022   BILITOT 0.3 04/12/2022   ALKPHOS 91 12/08/2021   AST 16 04/12/2022   ALT 15 04/12/2022   PROT 7.2 04/12/2022   ALBUMIN 3.9 12/08/2021   CALCIUM 9.2 04/12/2022   GFRAA 128 02/12/2021   QFTBGOLDPLUS NEGATIVE 07/28/2021    Speciality Comments: No specialty comments available.  Procedures:  No procedures performed Allergies: Patient has no known allergies.   Assessment / Plan:     Visit Diagnoses: No diagnosis found.  Orders: No orders of the defined types were placed in this encounter.  No orders of the defined types were placed in this encounter.   Face-to-face time spent with patient was *** minutes. Greater than 50% of time was spent in counseling and coordination of care.  Follow-Up Instructions: No follow-ups on file.   Earnestine Mealing, CMA  Note - This record has been created using Editor, commissioning.  Chart creation errors have been sought, but may not always  have been located.  Such creation errors do not reflect on  the standard of medical care.

## 2022-04-22 ENCOUNTER — Other Ambulatory Visit: Payer: Self-pay | Admitting: Nurse Practitioner

## 2022-04-22 DIAGNOSIS — E118 Type 2 diabetes mellitus with unspecified complications: Secondary | ICD-10-CM

## 2022-04-27 ENCOUNTER — Ambulatory Visit: Payer: Medicare HMO | Admitting: Physician Assistant

## 2022-04-27 DIAGNOSIS — M25512 Pain in left shoulder: Secondary | ICD-10-CM | POA: Diagnosis not present

## 2022-04-27 DIAGNOSIS — M25561 Pain in right knee: Secondary | ICD-10-CM | POA: Diagnosis not present

## 2022-04-27 DIAGNOSIS — M62838 Other muscle spasm: Secondary | ICD-10-CM

## 2022-04-27 DIAGNOSIS — M0609 Rheumatoid arthritis without rheumatoid factor, multiple sites: Secondary | ICD-10-CM

## 2022-04-27 DIAGNOSIS — M1712 Unilateral primary osteoarthritis, left knee: Secondary | ICD-10-CM | POA: Diagnosis not present

## 2022-04-27 DIAGNOSIS — M79672 Pain in left foot: Secondary | ICD-10-CM | POA: Diagnosis not present

## 2022-04-27 DIAGNOSIS — M79604 Pain in right leg: Secondary | ICD-10-CM | POA: Diagnosis not present

## 2022-04-27 DIAGNOSIS — Z8679 Personal history of other diseases of the circulatory system: Secondary | ICD-10-CM

## 2022-04-27 DIAGNOSIS — Z79899 Other long term (current) drug therapy: Secondary | ICD-10-CM

## 2022-04-27 DIAGNOSIS — M797 Fibromyalgia: Secondary | ICD-10-CM

## 2022-04-27 DIAGNOSIS — M170 Bilateral primary osteoarthritis of knee: Secondary | ICD-10-CM

## 2022-04-27 DIAGNOSIS — Z6841 Body Mass Index (BMI) 40.0 and over, adult: Secondary | ICD-10-CM | POA: Diagnosis not present

## 2022-04-27 DIAGNOSIS — Z79891 Long term (current) use of opiate analgesic: Secondary | ICD-10-CM | POA: Diagnosis not present

## 2022-04-27 DIAGNOSIS — G894 Chronic pain syndrome: Secondary | ICD-10-CM | POA: Diagnosis not present

## 2022-04-27 DIAGNOSIS — M79671 Pain in right foot: Secondary | ICD-10-CM | POA: Diagnosis not present

## 2022-04-27 DIAGNOSIS — M542 Cervicalgia: Secondary | ICD-10-CM | POA: Diagnosis not present

## 2022-04-27 DIAGNOSIS — A4151 Sepsis due to Escherichia coli [E. coli]: Secondary | ICD-10-CM

## 2022-04-27 DIAGNOSIS — Z872 Personal history of diseases of the skin and subcutaneous tissue: Secondary | ICD-10-CM

## 2022-04-27 DIAGNOSIS — F17200 Nicotine dependence, unspecified, uncomplicated: Secondary | ICD-10-CM

## 2022-04-27 DIAGNOSIS — Z8639 Personal history of other endocrine, nutritional and metabolic disease: Secondary | ICD-10-CM

## 2022-04-27 DIAGNOSIS — M79605 Pain in left leg: Secondary | ICD-10-CM | POA: Diagnosis not present

## 2022-04-27 DIAGNOSIS — M25571 Pain in right ankle and joints of right foot: Secondary | ICD-10-CM | POA: Diagnosis not present

## 2022-04-27 DIAGNOSIS — M25521 Pain in right elbow: Secondary | ICD-10-CM | POA: Diagnosis not present

## 2022-04-27 DIAGNOSIS — R5383 Other fatigue: Secondary | ICD-10-CM

## 2022-05-13 ENCOUNTER — Telehealth: Payer: Self-pay | Admitting: Nurse Practitioner

## 2022-05-13 NOTE — Telephone Encounter (Signed)
Tried calling patient to schedule Medicare Annual Wellness Visit (AWV) either virtually or in office.  Left both my jabber number [phone] and office number    No answer  Last AWV  07/12/21 ; please schedule at anytime with Anon Raices ins can be calendar year

## 2022-05-19 ENCOUNTER — Encounter: Payer: Self-pay | Admitting: *Deleted

## 2022-05-19 ENCOUNTER — Ambulatory Visit: Payer: Medicare HMO | Admitting: Physician Assistant

## 2022-05-24 ENCOUNTER — Telehealth: Payer: Self-pay | Admitting: Nurse Practitioner

## 2022-05-24 NOTE — Telephone Encounter (Signed)
Tried calling patient to schedule Medicare Annual Wellness Visit (AWV) either virtually or in office.  Left both my jabber number [phone] and office number    Last AWV 07/12/21 ; please schedule at anytime with [facility]    No answer

## 2022-05-25 DIAGNOSIS — M545 Low back pain, unspecified: Secondary | ICD-10-CM | POA: Diagnosis not present

## 2022-05-25 DIAGNOSIS — M25521 Pain in right elbow: Secondary | ICD-10-CM | POA: Diagnosis not present

## 2022-05-25 DIAGNOSIS — M79672 Pain in left foot: Secondary | ICD-10-CM | POA: Diagnosis not present

## 2022-05-25 DIAGNOSIS — M25571 Pain in right ankle and joints of right foot: Secondary | ICD-10-CM | POA: Diagnosis not present

## 2022-05-25 DIAGNOSIS — M79671 Pain in right foot: Secondary | ICD-10-CM | POA: Diagnosis not present

## 2022-05-25 DIAGNOSIS — Z79891 Long term (current) use of opiate analgesic: Secondary | ICD-10-CM | POA: Diagnosis not present

## 2022-05-25 DIAGNOSIS — M25512 Pain in left shoulder: Secondary | ICD-10-CM | POA: Diagnosis not present

## 2022-05-25 DIAGNOSIS — M1712 Unilateral primary osteoarthritis, left knee: Secondary | ICD-10-CM | POA: Diagnosis not present

## 2022-05-25 DIAGNOSIS — M79605 Pain in left leg: Secondary | ICD-10-CM | POA: Diagnosis not present

## 2022-05-25 DIAGNOSIS — M79604 Pain in right leg: Secondary | ICD-10-CM | POA: Diagnosis not present

## 2022-05-25 DIAGNOSIS — M25561 Pain in right knee: Secondary | ICD-10-CM | POA: Diagnosis not present

## 2022-05-25 DIAGNOSIS — M542 Cervicalgia: Secondary | ICD-10-CM | POA: Diagnosis not present

## 2022-05-25 DIAGNOSIS — G894 Chronic pain syndrome: Secondary | ICD-10-CM | POA: Diagnosis not present

## 2022-05-28 ENCOUNTER — Encounter: Payer: Self-pay | Admitting: Nurse Practitioner

## 2022-06-07 ENCOUNTER — Ambulatory Visit (INDEPENDENT_AMBULATORY_CARE_PROVIDER_SITE_OTHER): Payer: Medicare HMO | Admitting: Nurse Practitioner

## 2022-06-07 ENCOUNTER — Encounter: Payer: Self-pay | Admitting: Nurse Practitioner

## 2022-06-07 ENCOUNTER — Other Ambulatory Visit (HOSPITAL_COMMUNITY)
Admission: RE | Admit: 2022-06-07 | Discharge: 2022-06-07 | Disposition: A | Discharge status: Home / Self Care | Payer: Medicare HMO | Source: Ambulatory Visit | Attending: Nurse Practitioner | Admitting: Nurse Practitioner

## 2022-06-07 VITALS — BP 166/98 | HR 66 | Temp 97.7°F | Ht 66.0 in | Wt 276.0 lb

## 2022-06-07 DIAGNOSIS — E782 Mixed hyperlipidemia: Secondary | ICD-10-CM

## 2022-06-07 DIAGNOSIS — E669 Obesity, unspecified: Secondary | ICD-10-CM | POA: Diagnosis not present

## 2022-06-07 DIAGNOSIS — E559 Vitamin D deficiency, unspecified: Secondary | ICD-10-CM

## 2022-06-07 DIAGNOSIS — Z114 Encounter for screening for human immunodeficiency virus [HIV]: Secondary | ICD-10-CM | POA: Diagnosis not present

## 2022-06-07 DIAGNOSIS — Z1159 Encounter for screening for other viral diseases: Secondary | ICD-10-CM | POA: Diagnosis not present

## 2022-06-07 DIAGNOSIS — Z6841 Body Mass Index (BMI) 40.0 and over, adult: Secondary | ICD-10-CM

## 2022-06-07 DIAGNOSIS — Z124 Encounter for screening for malignant neoplasm of cervix: Secondary | ICD-10-CM

## 2022-06-07 DIAGNOSIS — Z113 Encounter for screening for infections with a predominantly sexual mode of transmission: Secondary | ICD-10-CM | POA: Diagnosis not present

## 2022-06-07 DIAGNOSIS — E118 Type 2 diabetes mellitus with unspecified complications: Secondary | ICD-10-CM

## 2022-06-07 DIAGNOSIS — Z0000 Encounter for general adult medical examination without abnormal findings: Secondary | ICD-10-CM

## 2022-06-07 DIAGNOSIS — I10 Essential (primary) hypertension: Secondary | ICD-10-CM | POA: Diagnosis not present

## 2022-06-07 DIAGNOSIS — Z79899 Other long term (current) drug therapy: Secondary | ICD-10-CM | POA: Diagnosis not present

## 2022-06-07 DIAGNOSIS — E6609 Other obesity due to excess calories: Secondary | ICD-10-CM | POA: Diagnosis not present

## 2022-06-07 DIAGNOSIS — Z23 Encounter for immunization: Secondary | ICD-10-CM

## 2022-06-07 DIAGNOSIS — R600 Localized edema: Secondary | ICD-10-CM

## 2022-06-07 DIAGNOSIS — E1169 Type 2 diabetes mellitus with other specified complication: Secondary | ICD-10-CM | POA: Diagnosis not present

## 2022-06-07 DIAGNOSIS — R69 Illness, unspecified: Secondary | ICD-10-CM | POA: Diagnosis not present

## 2022-06-07 LAB — POCT URINALYSIS DIPSTICK
Bilirubin, UA: NEGATIVE
Glucose, UA: NEGATIVE
Ketones, UA: NEGATIVE
Nitrite, UA: NEGATIVE
Protein, UA: NEGATIVE
Spec Grav, UA: 1.02 (ref 1.010–1.025)
Urobilinogen, UA: 0.2 E.U./dL
pH, UA: 6 (ref 5.0–8.0)

## 2022-06-07 MED ORDER — METOPROLOL TARTRATE 50 MG PO TABS: 50.0000 mg | ORAL_TABLET | Freq: Every day | ORAL | 1 refills | Status: DC

## 2022-06-07 MED ORDER — HYDROCHLOROTHIAZIDE 12.5 MG PO TABS: 12.5000 mg | ORAL_TABLET | Freq: Every day | ORAL | 1 refills | Status: DC | PRN

## 2022-06-07 MED ORDER — METFORMIN HCL 500 MG PO TABS: 500.0000 mg | ORAL_TABLET | Freq: Two times a day (BID) | ORAL | 1 refills | Status: DC

## 2022-06-07 MED ORDER — AMLODIPINE BESYLATE 10 MG PO TABS: 10.0000 mg | ORAL_TABLET | Freq: Every day | ORAL | 1 refills | Status: DC

## 2022-06-07 NOTE — Progress Notes (Signed)
Subjective:    Shelby Robertson is a 51 y.o. female who presents for a Welcome to Medicare exam.   Review of Systems Review of Systems  Constitutional: Negative.   HENT: Negative.    Eyes: Negative.   Respiratory: Negative.    Cardiovascular: Negative.   Gastrointestinal: Negative.   Genitourinary: Negative.   Musculoskeletal: Negative.   Skin: Negative.   Neurological: Negative.   Endo/Heme/Allergies: Negative.   Psychiatric/Behavioral: Negative.             Objective:    Today's Vitals   06/07/22 1225 06/07/22 1313  BP: (!) 166/92 (!) 166/98  Pulse: 66   Temp: 97.7 F (36.5 C)   TempSrc: Oral   Weight: 276 lb (125.2 kg)   Height: 5' 6"  (1.676 m)   Body mass index is 44.55 kg/m.  Medications Outpatient Encounter Medications as of 06/07/2022  Medication Sig   acetaminophen (TYLENOL) 325 MG tablet Take 2 tablets (650 mg total) by mouth every 6 (six) hours as needed for mild pain (or Fever >/= 101).   Adalimumab (HUMIRA PEN) 40 MG/0.8ML PNKT INJECT 1 PEN INTO THE SKIN EVERY 14 DAYS (Patient taking differently: Inject 40 mg into the skin every 14 (fourteen) days.)   albuterol (PROVENTIL) (2.5 MG/3ML) 0.083% nebulizer solution Take 3 mLs (2.5 mg total) by nebulization every 6 (six) hours as needed for wheezing or shortness of breath.   albuterol (VENTOLIN HFA) 108 (90 Base) MCG/ACT inhaler Inhale 2 puffs into the lungs every 4 (four) hours as needed for wheezing or shortness of breath (cough, shortness of breath or wheezing.).   aspirin 325 MG tablet Take 325 mg by mouth every 6 (six) hours as needed (pain).   atorvastatin (LIPITOR) 20 MG tablet Take 1 tablet (20 mg total) by mouth daily.   B-D TB SYRINGE 1CC/27GX1/2" 27G X 1/2" 1 ML MISC USE 1 SYRINGE TO INJECT METHOTREXATE INTO THE SKIN ONCE A WEEK.   blood glucose meter kit and supplies KIT Dispense based on patient and insurance preference. Use up to two times daily as directed. (FOR ICD-9 250.00, 250.01).    cefadroxil (DURICEF) 1 g tablet Take 1 tablet (1 g total) by mouth 2 (two) times daily.   cetirizine (ZYRTEC ALLERGY) 10 MG tablet Take 1 tablet (10 mg total) by mouth daily.   Clobetasol Propionate 0.05 % shampoo Apply onto dry scalp once daily, leave on 10 15 minutes and then rinse and shampoo off.   diphenhydrAMINE (BENADRYL) 25 MG tablet Take 25 mg by mouth every 6 (six) hours as needed for itching, allergies or sleep.   DULoxetine (CYMBALTA) 60 MG capsule Take 60 mg by mouth daily.   ezetimibe (ZETIA) 10 MG tablet TAKE 1 TABLET BY MOUTH EVERY DAY (Patient taking differently: Take 10 mg by mouth daily.)   HYDROcodone-acetaminophen (NORCO) 10-325 MG tablet Take 1 tablet by mouth every 6 (six) hours as needed for moderate pain.   hydrocortisone cream 1 % Apply to affected area 2 times daily   hydrOXYzine (ATARAX/VISTARIL) 25 MG tablet Take 1 tablet (25 mg total) by mouth every 8 (eight) hours as needed for itching.   Lancets (ONETOUCH DELICA PLUS TKPTWS56C) MISC 2 (two) times daily. use for testing   methotrexate 50 MG/2ML injection Inject 0.8 mls (20 mg total) into the skin weekly.   ONETOUCH VERIO test strip TEST TWICE DAILY   tiZANidine (ZANAFLEX) 4 MG capsule Take 4 mg by mouth every 6 (six) hours.   triamcinolone ointment (KENALOG) 0.1 %  Apply 1 application topically daily as needed (rash, itching).   Vitamin D, Ergocalciferol, (DRISDOL) 1.25 MG (50000 UNIT) CAPS capsule TAKE 1 CAPSULE BY MOUTH ON TUES AND ONE ON FRIDAYS. (Patient taking differently: Take 50,000 Units by mouth 2 (two) times a week. Take 1 capsule by mouth on Tues and one on Fridays.)   [DISCONTINUED] amLODipine (NORVASC) 10 MG tablet Take 1 tablet (10 mg total) by mouth daily.   [DISCONTINUED] hydrochlorothiazide (HYDRODIURIL) 12.5 MG tablet Take 1 tablet (12.5 mg total) by mouth daily as needed (fluid/edema).   [DISCONTINUED] metFORMIN (GLUCOPHAGE) 500 MG tablet Take 1 tablet (500 mg total) by mouth 2 (two) times daily with  a meal.   [DISCONTINUED] metoprolol tartrate (LOPRESSOR) 50 MG tablet Take 1 tablet (50 mg total) by mouth daily. Please schedule appointment for refills.   amLODipine (NORVASC) 10 MG tablet Take 1 tablet (10 mg total) by mouth daily.   hydrochlorothiazide (HYDRODIURIL) 12.5 MG tablet Take 1 tablet (12.5 mg total) by mouth daily as needed (fluid/edema).   metFORMIN (GLUCOPHAGE) 500 MG tablet Take 1 tablet (500 mg total) by mouth 2 (two) times daily with a meal.   metoprolol tartrate (LOPRESSOR) 50 MG tablet Take 1 tablet (50 mg total) by mouth daily. Please schedule appointment for refills.   No facility-administered encounter medications on file as of 06/07/2022.     History: Past Medical History:  Diagnosis Date   Diabetes mellitus without complication (HCC)    Eczema    Fibromyalgia    Fibromyalgia    Hypertension    Osteoarthritis    Rheumatoid arthritis(714.0)    Rheumatoid arthritis(714.0)    History reviewed. No pertinent surgical history.  Family History  Problem Relation Age of Onset   Diabetes Mother    Healthy Father    Heart attack Maternal Grandmother    Stroke Maternal Grandmother    Social History   Occupational History   Occupation: disability  Tobacco Use   Smoking status: Every Day    Packs/day: 0.50    Years: 20.00    Total pack years: 10.00    Types: Cigarettes    Passive exposure: Never   Smokeless tobacco: Never  Vaping Use   Vaping Use: Never used  Substance and Sexual Activity   Alcohol use: Not Currently   Drug use: Not Currently    Types: Hydrocodone   Sexual activity: Yes    Birth control/protection: Condom    Tobacco Counseling Ready to quit: Yes Counseling given: Yes   Immunizations and Health Maintenance Immunization History  Administered Date(s) Administered   Influenza,inj,Quad PF,6+ Mos 09/18/2019, 10/14/2020, 07/15/2021   Influenza-Unspecified 07/18/2014   PFIZER(Purple Top)SARS-COV-2 Vaccination 02/11/2020, 03/03/2020    Td 11/11/2008   Tdap 06/07/2022   Health Maintenance Due  Topic Date Due   PAP SMEAR-Modifier  01/06/2020   COVID-19 Vaccine (3 - Pfizer risk series) 03/31/2020   FOOT EXAM  02/12/2022   INFLUENZA VACCINE  05/18/2022    Activities of Daily Living    06/07/2022   12:18 PM 11/27/2021    7:00 PM  In your present state of health, do you have any difficulty performing the following activities:  Hearing? 0 0  Vision? 0 0  Difficulty concentrating or making decisions? 0 0  Walking or climbing stairs? 0 0  Dressing or bathing? 0 0  Doing errands, shopping? 0 0  Preparing Food and eating ? N   Using the Toilet? N   In the past six months, have you accidently leaked  urine? Y   Do you have problems with loss of bowel control? N   Managing your Medications? N   Managing your Finances? N   Housekeeping or managing your Housekeeping? N    Physical Exam Vitals reviewed. Exam conducted with a chaperone present.  Constitutional:      General: She is not in acute distress.    Appearance: Normal appearance.  Cardiovascular:     Rate and Rhythm: Normal rate and regular rhythm.  Pulmonary:     Effort: Pulmonary effort is normal. No respiratory distress.  Genitourinary:    General: Normal vulva.     Labia:        Right: No rash.        Left: No rash.      Vagina: Normal.     Cervix: Normal and dilated.     Uterus: Normal.      Adnexa: Right adnexa normal and left adnexa normal.       Right: No mass or tenderness.         Left: No mass or tenderness.       Rectum: Normal. Guaiac result negative. No mass.  Musculoskeletal:     Cervical back: Normal range of motion and neck supple.  Neurological:     General: No focal deficit present.     Mental Status: She is alert and oriented to person, place, and time. Mental status is at baseline.     Cranial Nerves: No cranial nerve deficit.  Psychiatric:        Mood and Affect: Mood and affect normal.        Behavior: Behavior normal.         Thought Content: Thought content normal.        Cognition and Memory: Memory normal.        Judgment: Judgment normal.    (optional), or other factors deemed appropriate based on the beneficiary's medical and social history and current clinical standards.  Advanced Directives: Does Patient Have a Medical Advance Directive?: Yes    Assessment:    This is a routine wellness examination for this patient .   Vision/Hearing screen Hearing Screening   500Hz  1000Hz  2000Hz  4000Hz  5000Hz   Right ear Pass Pass Pass Pass Pass  Left ear Pass Pass Pass Pass Pass   Vision Screening   Right eye Left eye Both eyes  Without correction 20 30 20 30 20 20   With correction       Dietary issues and exercise activities discussed:      Goals       Patient Stated      01/16/2020, wants to lose as much weight as possible       Weight (lb) < 200 lb (90.7 kg) (pt-stated)      Weight (lb) < 200 lb (90.7 kg)      Weight (lb) < 200 lb (90.7 kg)      She would like to lose weight.       Depression Screen    06/07/2022   12:14 PM 06/07/2022   11:56 AM 07/12/2021   11:01 AM 02/12/2021    9:10 AM  PHQ 2/9 Scores  PHQ - 2 Score 0 0 0 0     Fall Risk    06/07/2022   12:18 PM  [place] in the past year? 1  Number falls in past yr: 1  Injury with Fall? 0  Risk for fall due to : No Fall Risks  Follow up Falls evaluation completed    Cognitive Function:        06/07/2022   12:19 PM 07/12/2021   11:13 AM 01/16/2020    2:41 PM 01/10/2019   11:38 AM  6CIT Screen  What Year? 0 points 0 points 0 points 0 points  What month? 0 points 0 points 0 points 0 points  What time? 0 points 0 points 0 points 0 points  Count back from 20 0 points 0 points 0 points 0 points  Months in reverse 0 points 0 points 2 points 4 points  Repeat phrase 0 points 0 points 0 points 0 points  Total Score 0 points 0 points 2 points 4 points    Patient Care Team: Minette Brine, FNP as PCP - General (General  Practice)     Plan:  1. Encounter for Medicare annual wellness exam Pt's annual wellness exam was performed and geriatric assessment reviewed.  She is having difficulty with getting to the office will try to get her set up with [facility], she is frequently late to her appts or no shows WIll obtain routine labs.  Will obtain UA and micro.  Behavior modifications discussed and diet history reviewed. Pt will continue to exercise regularly and modify diet, with low GI, plant based foods and decrease food intake of processed foods.  Recommend intake of daily multivitamin, Vitamin D, and calcium. Recommond mammogram and colonoscopy for preventive screenings, as well as recommend immunizations that include influenza and TDAP  - POCT Urinalysis Dipstick (81002) - EKG 12-Lead - Microalbumin / Creatinine Urine Ratio  2. Encounter for hepatitis C screening test for low risk patient Will check Hepatitis C screening due to recent recommendations to screen all adults 18 years and older - Hepatitis C antibody  3. Encounter for HIV (human immunodeficiency virus) test  4. Screening for STD (sexually transmitted disease) - Cervicovaginal ancillary only - Hepatitis B surface antigen - HSV(herpes simplex vrs) 1+2 ab-IgG - RPR  5. Encounter for Papanicolaou smear of cervix PAP done no abnormal findings - Cytology -Pap Smear  6. Need for Tdap vaccination. Will give tetanus vaccine today while in office. Refer to order management. TDAP will be administered to adults 64-67 years old every 10 years. [employer]Rx signed - Tdap vaccine greater than or equal to 7yo IM  7. Class 3 severe obesity due to excess calories without serious comorbidity with body mass index (BMI) of 45.0 to 49.9 in adult [facility]) Comments: Will refer to PREP, she is moving a little slower today however may be from deconditioning.  - Amb Referral To Provider Referral Exercise Program (P.R.E.P)  8. Essential hypertension Comments:  Blood pressure increased, advised to take her HCTZ more regularly and to check her blood pressure when she gets home and gets situated.  - POCT Urinalysis Dipstick (81002) - AMB Referral to [place] / Creatinine Urine Ratio  9. Diabetes mellitus type 2 in obese [facility]) Comments: Fair control, continue metformin. Encouraged to eat diet low in sugar and starches.  - AMB Referral to [facility] Coordin[facility] - amLODipine (NORVASC) 10 MG tablet; Take 1 tablet (10 mg total) by mouth daily.  Dispense: 90 tablet; Refill: 1 - metFORMIN (GLUCOPHAGE) 500 MG tablet; Take 1 tablet (500 mg total) by mouth 2 (two) times daily with a meal.  Dispense: 180 tablet; Refill: 1 - metoprolol tartrate (LOPRESSOR) 50 MG tablet; Take 1 tablet (50 mg total) by mouth daily. Please schedule appointment for refills.  Dispense:  90 tablet; Refill: 1 - Hemoglobin A1c  10. Vitamin D deficiency - VITAMIN D 25 Hydroxy (Vit-D Deficiency, Fractures)  11. Mixed hyperlipidemia Comments: Cholesterol levels was elevated at previous visit, advised to cut back on breads and sweets - AMB Referral to [place] panel  12. Localized edema Comments: This is long standing encouraged to wear support socks and to elevate when possible.  - hydrochlorothiazide (HYDRODIURIL) 12.5 MG tablet; Take 1 tablet (12.5 mg total) by mouth daily as needed (fluid/edema).  Dispense: 90 tablet; Refill: 1  13. Other long term (current) drug therapy - CBC    I have personally reviewed and noted the following in the patient's chart:   Medical and social history Use of alcohol, tobacco or illicit drugs  Current medications and supplements Functional ability and status Nutritional status Physical activity Advanced directives List of other physicians Hospitalizations, surgeries, and ER visits in previous 12 months Vitals Screenings to include cognitive, depression, and  falls Referrals and appointments  In addition, I have reviewed and discussed with patient certain preventive protocols, quality metrics, and best practice recommendations. A written personalized care plan for preventive services as well as general preventive health recommendations were provided to patient.     Minette Brine, FNP 06/09/2022

## 2022-06-07 NOTE — Patient Instructions (Addendum)
Health Maintenance, Female Adopting a healthy lifestyle and getting preventive care are important in promoting health and wellness. Ask your health care provider about: The right schedule for you to have regular tests and exams. Things you can do on your own to prevent diseases and keep yourself healthy. What should I know about diet, weight, and exercise? Eat a healthy diet  Eat a diet that includes plenty of vegetables, fruits, low-fat dairy products, and lean protein. Do not eat a lot of foods that are high in solid fats, added sugars, or sodium. Maintain a healthy weight Body mass index (BMI) is used to identify weight problems. It estimates body fat based on height and weight. Your health care provider can help determine your BMI and help you achieve or maintain a healthy weight. Get regular exercise Get regular exercise. This is one of the most important things you can do for your health. Most adults should: Exercise for at least 150 minutes each week. The exercise should increase your heart rate and make you sweat (moderate-intensity exercise). Do strengthening exercises at least twice a week. This is in addition to the moderate-intensity exercise. Spend less time sitting. Even light physical activity can be beneficial. Watch cholesterol and blood lipids Have your blood tested for lipids and cholesterol at 49 years of age, then have this test every 5 years. Have your cholesterol levels checked more often if: Your lipid or cholesterol levels are high. You are older than 49 years of age. You are at high risk for heart disease. What should I know about cancer screening? Depending on your health history and family history, you may need to have cancer screening at various ages. This may include screening for: Breast cancer. Cervical cancer. Colorectal cancer. Skin cancer. Lung cancer. What should I know about heart disease, diabetes, and high blood pressure? Blood pressure and heart  disease High blood pressure causes heart disease and increases the risk of stroke. This is more likely to develop in people who have high blood pressure readings or are overweight. Have your blood pressure checked: Every 3-5 years if you are 18-39 years of age. Every year if you are 40 years old or older. Diabetes Have regular diabetes screenings. This checks your fasting blood sugar level. Have the screening done: Once every three years after age 40 if you are at a normal weight and have a low risk for diabetes. More often and at a younger age if you are overweight or have a high risk for diabetes. What should I know about preventing infection? Hepatitis B If you have a higher risk for hepatitis B, you should be screened for this virus. Talk with your health care provider to find out if you are at risk for hepatitis B infection. Hepatitis C Testing is recommended for: Everyone born from 1945 through 1965. Anyone with known risk factors for hepatitis C. Sexually transmitted infections (STIs) Get screened for STIs, including gonorrhea and chlamydia, if: You are sexually active and are younger than 49 years of age. You are older than 49 years of age and your health care provider tells you that you are at risk for this type of infection. Your sexual activity has changed since you were last screened, and you are at increased risk for chlamydia or gonorrhea. Ask your health care provider if you are at risk. Ask your health care provider about whether you are at high risk for HIV. Your health care provider may recommend a prescription medicine to help prevent HIV   infection. If you choose to take medicine to prevent HIV, you should first get tested for HIV. You should then be tested every 3 months for as long as you are taking the medicine. Pregnancy If you are about to stop having your period (premenopausal) and you may become pregnant, seek counseling before you get pregnant. Take 400 to 800  micrograms (mcg) of folic acid every day if you become pregnant. Ask for birth control (contraception) if you want to prevent pregnancy. Osteoporosis and menopause Osteoporosis is a disease in which the bones lose minerals and strength with aging. This can result in bone fractures. If you are 6 years old or older, or if you are at risk for osteoporosis and fractures, ask your health care provider if you should: Be screened for bone loss. Take a calcium or vitamin D supplement to lower your risk of fractures. Be given hormone replacement therapy (HRT) to treat symptoms of menopause. Follow these instructions at home: Alcohol use Do not drink alcohol if: Your health care provider tells you not to drink. You are pregnant, may be pregnant, or are planning to become pregnant. If you drink alcohol: Limit how much you have to: 0-1 drink a day. Know how much alcohol is in your drink. In the [place] one drink equals one 12 oz bottle of beer (355 mL), one 5 oz glass of wine (148 mL), or one 1 oz glass of hard liquor (44 mL). Lifestyle Do not use any products that contain nicotine or tobacco. These products include cigarettes, chewing tobacco, and vaping devices, such as e-cigarettes. If you need help quitting, ask your health care provider. Do not use street drugs. Do not share needles. Ask your health care provider for help if you need support or information about quitting drugs. General instructions Schedule regular health, dental, and eye exams. Stay current with your vaccines. Tell your health care provider if: You often feel depressed. You have ever been abused or do not feel safe at home. Summary Adopting a healthy lifestyle and getting preventive care are important in promoting health and wellness. Follow your health care provider's instructions about healthy diet, exercising, and getting tested or screened for diseases. Follow your health care provider's instructions on monitoring your  cholesterol and blood pressure. This information is not intended to replace advice given to you by your health care provider. Make sure you discuss any questions you have with your health care provider. Document Revised: 02/23/2021 Document Reviewed: 02/23/2021 Elsevier Patient Education  [place] Directive  Advance directives are legal documents that allow you to make decisions about your health care and medical treatment in case you become unable to communicate for yourself. Advance directives let your wishes be known to family, friends, and health care providers. Discussing and writing advance directives should happen over time rather than all at once. Advance directives can be changed and updated at any time. There are different types of advance directives, such as: Medical power of attorney. Living will. Do not resuscitate (DNR) order or do not attempt resuscitation (DNAR) order. Health care proxy and medical power of attorney A health care proxy is also called a health care agent. This person is appointed to make medical decisions for you when you are unable to make decisions for yourself. Generally, people ask a trusted friend or family member to act as their proxy and represent their preferences. Make sure you have an agreement with your trusted person to act as your proxy. A  proxy may have to make a medical decision on your behalf if your wishes are not known. A medical power of attorney, also called a durable power of attorney for health care, is a legal document that names your health care proxy. Depending on the laws in your state, the document may need to be: Signed. Notarized. Dated. Copied. Witnessed. Incorporated into your medical record. You may also want to appoint a trusted person to manage your money in the event you are unable to do so. This is called a durable power of attorney for finances. It is a separate legal document from the durable power of  attorney for health care. You may choose your health care proxy or someone different to act as your agent in money matters. If you do not appoint a proxy, or there is a concern that the proxy is not acting in your best interest, a court may appoint a guardian to act on your behalf. Living will A living will is a set of instructions that state your wishes about medical care when you cannot express them yourself. Health care providers should keep a copy of your living will in your medical record. You may want to give a copy to family members or friends. To alert caregivers in case of an emergency, you can place a card in your wallet to let them know that you have a living will and where they can find it. A living will is used if you become: Terminally ill. Disabled. Unable to communicate or make decisions. The following decisions should be included in your living will: To use or not to use life support equipment, such as dialysis machines and breathing machines (ventilators). Whether you want a DNR or DNAR order. This tells health care providers not to use cardiopulmonary resuscitation (CPR) if breathing or heartbeat stops. To use or not to use tube feeding. To be given or not to be given food and fluids. Whether you want comfort (palliative) care when the goal becomes comfort rather than a cure. Whether you want to donate your organs and tissues. A living will does not give instructions for distributing your money and property if you should pass away. DNR or DNAR A DNR or DNAR order is a request not to have CPR in the event that your heart stops beating or you stop breathing. If a DNR or DNAR order has not been made and shared, a health care provider will try to help any patient whose heart has stopped or who has stopped breathing. If you plan to have surgery, talk with your health care provider about how your DNR or DNAR order will be followed if problems occur. What if I do not have an advance  directive? Some states assign family decision makers to act on your behalf if you do not have an advance directive. Each state has its own laws about advance directives. You may want to check with your health care provider, attorney, or state representative about the laws in your state. Summary Advance directives are legal documents that allow you to make decisions about your health care and medical treatment in case you become unable to communicate for yourself. The process of discussing and writing advance directives should happen over time. You can change and update advance directives at any time. Advance directives may include a medical power of attorney, a living will, and a DNR or DNAR order. This information is not intended to replace advice given to you by your health care provider.  Make sure you discuss any questions you have with your health care provider. Document Revised: 07/08/2020 Document Reviewed: 07/08/2020 Elsevier Patient Education  [place].

## 2022-06-08 LAB — CBC
Hematocrit: 42.8 % (ref 34.0–46.6)
Hemoglobin: 14.5 g/dL (ref 11.1–15.9)
MCH: 30.9 pg (ref 26.6–33.0)
MCHC: 33.9 g/dL (ref 31.5–35.7)
MCV: 91 fL (ref 79–97)
Platelets: 346 10*3/uL (ref 150–450)
RBC: 4.7 x10E6/uL (ref 3.77–5.28)
RDW: 13.9 % (ref 11.7–15.4)
WBC: 7.3 10*3/uL (ref 3.4–10.8)

## 2022-06-08 LAB — CMP14+EGFR
ALT: 9 IU/L (ref 0–32)
AST: 13 IU/L (ref 0–40)
Albumin/Globulin Ratio: 1.1 — ABNORMAL LOW (ref 1.2–2.2)
Albumin: 4.4 g/dL (ref 3.9–4.9)
Alkaline Phosphatase: 108 IU/L (ref 44–121)
BUN/Creatinine Ratio: 16 (ref 9–23)
BUN: 13 mg/dL (ref 6–24)
Bilirubin Total: 0.2 mg/dL (ref 0.0–1.2)
CO2: 25 mmol/L (ref 20–29)
Calcium: 10 mg/dL (ref 8.7–10.2)
Chloride: 100 mmol/L (ref 96–106)
Creatinine, Ser: 0.79 mg/dL (ref 0.57–1.00)
Globulin, Total: 4.1 g/dL (ref 1.5–4.5)
Glucose: 90 mg/dL (ref 70–99)
Potassium: 3.9 mmol/L (ref 3.5–5.2)
Sodium: 138 mmol/L (ref 134–144)
Total Protein: 8.5 g/dL (ref 6.0–8.5)
eGFR: 92 mL/min/{1.73_m2} (ref >59)

## 2022-06-08 LAB — VITAMIN D 25 HYDROXY (VIT D DEFICIENCY, FRACTURES): Vit D, 25-Hydroxy: 7.7 ng/mL — ABNORMAL LOW (ref 30.0–100.0)

## 2022-06-08 LAB — HSV(HERPES SIMPLEX VRS) I + II AB-IGG
HSV 1 Glycoprotein G Ab, IgG: <0.91 index (ref 0.00–0.90)
HSV 2 IgG, Type Spec: 17.4 index — ABNORMAL HIGH (ref 0.00–0.90)

## 2022-06-08 LAB — HEMOGLOBIN A1C
Est. average glucose Bld gHb Est-mCnc: 120 mg/dL
Hgb A1c MFr Bld: 5.8 % — ABNORMAL HIGH (ref 4.8–5.6)

## 2022-06-08 LAB — LIPID PANEL
Chol/HDL Ratio: 4.3 ratio (ref 0.0–4.4)
Cholesterol, Total: 194 mg/dL (ref 100–199)
HDL: 45 mg/dL (ref >39)
LDL Chol Calc (NIH): 114 mg/dL — ABNORMAL HIGH (ref 0–99)
Triglycerides: 202 mg/dL — ABNORMAL HIGH (ref 0–149)
VLDL Cholesterol Cal: 35 mg/dL (ref 5–40)

## 2022-06-08 LAB — HEPATITIS C ANTIBODY: Hep C Virus Ab: NONREACTIVE

## 2022-06-08 LAB — RPR: RPR Ser Ql: NONREACTIVE

## 2022-06-08 LAB — MICROALBUMIN / CREATININE URINE RATIO
Creatinine, Urine: 111.4 mg/dL
Microalb/Creat Ratio: 32 mg/g creat — ABNORMAL HIGH (ref 0–29)
Microalbumin, Urine: 36.2 ug/mL

## 2022-06-08 LAB — HEPATITIS B SURFACE ANTIGEN: Hepatitis B Surface Ag: NEGATIVE

## 2022-06-09 LAB — CERVICOVAGINAL ANCILLARY ONLY
Bacterial Vaginitis (gardnerella): NEGATIVE
Candida Glabrata: NEGATIVE
Candida Vaginitis: NEGATIVE
Chlamydia: NEGATIVE
Comment: NEGATIVE
Comment: NEGATIVE
Comment: NEGATIVE
Comment: NEGATIVE
Comment: NEGATIVE
Comment: NORMAL
Neisseria Gonorrhea: NEGATIVE
Trichomonas: NEGATIVE

## 2022-06-10 ENCOUNTER — Other Ambulatory Visit: Payer: Self-pay | Admitting: Nurse Practitioner

## 2022-06-10 ENCOUNTER — Telehealth: Payer: Self-pay

## 2022-06-10 DIAGNOSIS — E559 Vitamin D deficiency, unspecified: Secondary | ICD-10-CM

## 2022-06-10 MED ORDER — VITAMIN D (ERGOCALCIFEROL) 1.25 MG (50000 UNIT) PO CAPS: 50000.0000 [IU] | ORAL_CAPSULE | ORAL | 1 refills | Status: DC

## 2022-06-10 NOTE — Telephone Encounter (Signed)
Attempted to reach pt reference PREP referral Unable to leave message as VM full

## 2022-06-14 LAB — CYTOLOGY - PAP: Diagnosis: NEGATIVE

## 2022-06-16 ENCOUNTER — Telehealth: Payer: Self-pay | Admitting: *Deleted

## 2022-06-16 NOTE — Chronic Care Management (AMB) (Unsigned)
  Chronic Care Management   Outreach Note  06/16/2022 Name: Shelby Robertson MRN: [id number] DOB: [birth year]  Shelby Robertson is a 51 y.o. year old female who is a primary care patient of Minette Brine, [place]. I reached out to Shelby Robertson by phone today in response to a referral sent by Shelby Robertson's primary care provider.  An unsuccessful telephone outreach was attempted today. The patient was referred to the case management team for assistance with care management and care coordination.   Follow Up Plan: A HIPAA compliant phone message was left for the patient providing contact information and requesting a return call.  The care management team will reach out to the patient again over the next 7 days.  If patient returns call to provider office, please advise to call [place]* at [phone].*  Shelby Robertson  Direct Dial: [phone]

## 2022-06-17 NOTE — Chronic Care Management (AMB) (Signed)
No longer scheduling for CCM as of 06/17/22 '@1730'$  SJS

## 2022-06-29 DIAGNOSIS — M1712 Unilateral primary osteoarthritis, left knee: Secondary | ICD-10-CM | POA: Diagnosis not present

## 2022-06-29 DIAGNOSIS — M79671 Pain in right foot: Secondary | ICD-10-CM | POA: Diagnosis not present

## 2022-06-29 DIAGNOSIS — E1141 Type 2 diabetes mellitus with diabetic mononeuropathy: Secondary | ICD-10-CM | POA: Diagnosis not present

## 2022-06-29 DIAGNOSIS — M79604 Pain in right leg: Secondary | ICD-10-CM | POA: Diagnosis not present

## 2022-06-29 DIAGNOSIS — M25571 Pain in right ankle and joints of right foot: Secondary | ICD-10-CM | POA: Diagnosis not present

## 2022-06-29 DIAGNOSIS — G894 Chronic pain syndrome: Secondary | ICD-10-CM | POA: Diagnosis not present

## 2022-06-29 DIAGNOSIS — Z79891 Long term (current) use of opiate analgesic: Secondary | ICD-10-CM | POA: Diagnosis not present

## 2022-06-29 DIAGNOSIS — M79672 Pain in left foot: Secondary | ICD-10-CM | POA: Diagnosis not present

## 2022-06-29 DIAGNOSIS — M542 Cervicalgia: Secondary | ICD-10-CM | POA: Diagnosis not present

## 2022-06-29 DIAGNOSIS — M25561 Pain in right knee: Secondary | ICD-10-CM | POA: Diagnosis not present

## 2022-06-29 DIAGNOSIS — M25512 Pain in left shoulder: Secondary | ICD-10-CM | POA: Diagnosis not present

## 2022-06-29 DIAGNOSIS — M79605 Pain in left leg: Secondary | ICD-10-CM | POA: Diagnosis not present

## 2022-06-29 DIAGNOSIS — M25521 Pain in right elbow: Secondary | ICD-10-CM | POA: Diagnosis not present

## 2022-07-01 ENCOUNTER — Telehealth: Payer: Self-pay | Admitting: *Deleted

## 2022-07-01 ENCOUNTER — Other Ambulatory Visit: Payer: Self-pay | Admitting: Nurse Practitioner

## 2022-07-01 DIAGNOSIS — E782 Mixed hyperlipidemia: Secondary | ICD-10-CM

## 2022-07-01 NOTE — Chronic Care Management (AMB) (Signed)
  Care Coordination   Note   07/01/2022 Name: Perry Molla MRN: [id number] DOB: October 16, 1971  Xitlalli Newhard is a 51 y.o. year old female who sees Minette Brine, FNP for primary care. I reached out to [name] by phone today to offer care coordination services.  Ms. Heitmeyer was given information about Care Coordination services today including:   The Care Coordination services include support from the care team which includes your Nurse Coordinator, Clinical Social Worker, or Pharmacist.  The Care Coordination team is here to help remove barriers to the health concerns and goals most important to you. Care Coordination services are voluntary, and the patient may decline or stop services at any time by request to their care team member.   Care Coordination Consent Status: Patient agreed to services and verbal consent obtained.   Follow up plan:  Telephone appointment with care coordination team member scheduled for:  07/05/22  Encounter Outcome:  Pt. Scheduled  [place]  Direct Dial: [phone]

## 2022-07-05 ENCOUNTER — Telehealth: Payer: Self-pay

## 2022-07-05 NOTE — Patient Outreach (Signed)
  Care Coordination   07/05/2022 Name: Shelby Robertson MRN: [id number] DOB: 06-28-1971   Care Coordination Outreach Attempts:  An unsuccessful telephone outreach was attempted for a scheduled appointment today.  Follow Up Plan:  Additional outreach attempts will be made to offer the patient care coordination information and services.   Encounter Outcome:  No Answer  Care Coordination Interventions Activated:  No   Care Coordination Interventions:  No, not indicated    Daneen Schick, BSW, CDP Social Worker, Certified Dementia Practitioner [facility] Care Management  Care Coordination [phone]

## 2022-07-08 ENCOUNTER — Telehealth: Payer: Self-pay

## 2022-07-08 NOTE — Patient Outreach (Signed)
  Care Coordination   07/08/2022 Name: Shelby Robertson MRN: [id number] DOB: [birth year]   Care Coordination Outreach Attempts:  An unsuccessful telephone outreach was attempted for a scheduled appointment today.  Follow Up Plan:  Additional outreach attempts will be made to offer the patient care coordination information and services.   Encounter Outcome:  No Answer  Care Coordination Interventions Activated:  No   Care Coordination Interventions:  No, not indicated    Daneen Schick, BSW, CDP Social Worker, Certified Dementia Practitioner [facility] Care Management  Care Coordination [phone]

## 2022-07-13 ENCOUNTER — Ambulatory Visit: Payer: Self-pay

## 2022-07-13 NOTE — Patient Instructions (Signed)
Visit Information  Thank you for taking time to visit with me today. Please don't hesitate to contact me if I can be of assistance to you.   Following are the goals we discussed today:   Goals Addressed             This Visit's Progress    COMPLETED: Care Coordination Activities       Care Coordination Interventions: SDoH screening completed - no acute resource challenges identified at this time Determined the patient does not have concerns with medication costs and/or adherence at this time Reviewed recent office visit note indicating provider would like patient to work with the care coordination team to address HTN and DM II Scheduled initial call with RN Care Manager on 07/27/22        Your next appointment is by telephone on 10/10 at 2:00 with [place]  Please call the care guide team at [phone] if you need to cancel or reschedule your appointment.   If you are experiencing a Mental Health or [place] or need someone to talk to, please call 1-800-273-TALK (toll free, 24 hour hotline)  The patient verbalized understanding of instructions, educational materials, and care plan provided today and DECLINED offer to receive copy of patient instructions, educational materials, and care plan.   Telephone follow up appointment with care management team member scheduled for:10/10  Daneen Schick, BSW, CDP Social Worker, Certified Dementia Practitioner [place] [facility]  Care Coordination [phone]

## 2022-07-13 NOTE — Patient Outreach (Signed)
  Care Coordination   Initial Visit Note   07/13/2022 Name: Shelby Robertson MRN: [id number] DOB: September 10, 1971  Shelby Robertson is a 51 y.o. year old female who sees Minette Brine, FNP for primary care. I spoke with  [name] by phone today.  What matters to the patients health and wellness today?  Better manage DM II and HTN    Goals Addressed             This Visit's Progress    COMPLETED: Care Coordination Activities       Care Coordination Interventions: SDoH screening completed - no acute resource challenges identified at this time Determined the patient does not have concerns with medication costs and/or adherence at this time Reviewed recent office visit note indicating provider would like patient to work with the care coordination team to address HTN and DM II Scheduled initial call with RN Care Manager on 07/27/22        SDOH assessments and interventions completed:  Yes  SDOH Interventions Today    Flowsheet Row Most Recent Value  SDOH Interventions   Food Insecurity Interventions Intervention Not Indicated  Housing Interventions Intervention Not Indicated  Transportation Interventions Intervention Not Indicated  Utilities Interventions Intervention Not Indicated        Care Coordination Interventions Activated:  Yes  Care Coordination Interventions:  Yes, provided   Follow up plan: Follow up call scheduled for 10/10 with [place]    Encounter Outcome:  Pt. Visit Completed   Daneen Schick, Arita Miss, CDP Social Worker, Certified Dementia Practitioner [facility] Care Management  Care Coordination [phone]

## 2022-07-27 ENCOUNTER — Ambulatory Visit: Payer: Self-pay

## 2022-07-27 DIAGNOSIS — G894 Chronic pain syndrome: Secondary | ICD-10-CM | POA: Diagnosis not present

## 2022-07-27 DIAGNOSIS — Z79891 Long term (current) use of opiate analgesic: Secondary | ICD-10-CM | POA: Diagnosis not present

## 2022-07-27 DIAGNOSIS — M79605 Pain in left leg: Secondary | ICD-10-CM | POA: Diagnosis not present

## 2022-07-27 DIAGNOSIS — M79672 Pain in left foot: Secondary | ICD-10-CM | POA: Diagnosis not present

## 2022-07-27 DIAGNOSIS — M79671 Pain in right foot: Secondary | ICD-10-CM | POA: Diagnosis not present

## 2022-07-27 DIAGNOSIS — M25561 Pain in right knee: Secondary | ICD-10-CM | POA: Diagnosis not present

## 2022-07-27 DIAGNOSIS — M542 Cervicalgia: Secondary | ICD-10-CM | POA: Diagnosis not present

## 2022-07-27 DIAGNOSIS — M25571 Pain in right ankle and joints of right foot: Secondary | ICD-10-CM | POA: Diagnosis not present

## 2022-07-27 DIAGNOSIS — M25512 Pain in left shoulder: Secondary | ICD-10-CM | POA: Diagnosis not present

## 2022-07-27 DIAGNOSIS — M79604 Pain in right leg: Secondary | ICD-10-CM | POA: Diagnosis not present

## 2022-07-27 DIAGNOSIS — M25521 Pain in right elbow: Secondary | ICD-10-CM | POA: Diagnosis not present

## 2022-07-27 DIAGNOSIS — M1712 Unilateral primary osteoarthritis, left knee: Secondary | ICD-10-CM | POA: Diagnosis not present

## 2022-07-27 DIAGNOSIS — M545 Low back pain, unspecified: Secondary | ICD-10-CM | POA: Diagnosis not present

## 2022-07-27 NOTE — Patient Outreach (Signed)
  Care Coordination   07/27/2022 Name: Shelby Robertson MRN: [id number] DOB: 01/03/1971   Care Coordination Outreach Attempts:  An unsuccessful telephone outreach was attempted for a scheduled appointment today.  Follow Up Plan:  Additional outreach attempts will be made to offer the patient care coordination information and services.   Encounter Outcome:  No Answer  Care Coordination Interventions Activated:  No   Care Coordination Interventions:  No, not indicated    Barb Merino, RN, BSN, CCM Care Management Coordinator [place] Management Direct Phone: [phone]

## 2022-08-03 ENCOUNTER — Telehealth: Payer: Self-pay | Admitting: *Deleted

## 2022-08-03 NOTE — Chronic Care Management (AMB) (Signed)
  Care Coordination Note  08/03/2022 Name: Shelby Robertson MRN: [id number] DOB: 12/28/1970  Shelby Robertson is a 51 y.o. year old female who is a primary care patient of Minette Brine, [place] and is actively engaged with the care management team. I reached out to [name] by phone today to assist with re-scheduling an initial visit with the RN Case Manager  Follow up plan: Unsuccessful telephone outreach attempt made. A HIPAA compliant phone message was left for the patient providing contact information and requesting a return call.   [place]  Direct Dial: [phone]

## 2022-08-10 NOTE — Chronic Care Management (AMB) (Signed)
  Care Coordination Note  08/10/2022 Name: Lakesa Coste MRN: [id number] DOB: Aug 20, 1971  Shelby Robertson is a 51 y.o. year old female who is a primary care patient of Minette Brine, [place] and is actively engaged with the care management team. I reached out to [name] by phone today to assist with re-scheduling an initial visit with the RN Case Manager  Follow up plan: Telephone appointment with care management team member scheduled for:08/30/22  [place]: [phone]

## 2022-08-30 NOTE — Patient Outreach (Signed)
  Care Coordination   Follow Up Visit Note   08/30/2022 Name: Shelby Robertson MRN: [id number] DOB: August 18, 1971  Shelby Robertson is a 51 y.o. year old female who sees Minette Brine, FNP for primary care. I spoke with  Shelby Robertson by phone today.  What matters to the patients health and wellness today?  Patient will monitor her blood pressure at home and record her readings.     Goals Addressed               This Visit's Progress     Patient Stated     I will work on improving my blood pressure (pt-stated)        Care Coordination Interventions: Evaluation of current treatment plan related to hypertension self management and patient's adherence to plan as established by provider Provided assistance with obtaining home blood pressure monitor via [employer]; Advised patient, providing education and rationale, to monitor blood pressure daily and record, calling PCP for findings outside established parameters Provided education on prescribed diet low Sodium Discussed complications of poorly controlled blood pressure such as heart disease, stroke, circulatory complications, vision complications, kidney impairment, sexual dysfunction Mailed printed educational materials related to How to Accurately Monitor BP at Home; What is High Blood Pressure?; BP log; Why Should I Limit Sodium?; African Americans and Stroke             SDOH assessments and interventions completed:  No     Care Coordination Interventions Activated:  Yes  Care Coordination Interventions:  Yes, provided   Follow up plan: Follow up call scheduled for 09/13/22 '@230'$  PM    Encounter Outcome:  Pt. Visit Completed

## 2022-08-30 NOTE — Patient Instructions (Addendum)
Visit Information  Thank you for taking time to visit with me today. Please don't hesitate to contact me if I can be of assistance to you.   Following are the goals we discussed today:   Goals Addressed               This Visit's Progress     Patient Stated     I will work on improving my blood pressure (pt-stated)        Care Coordination Interventions: Evaluation of current treatment plan related to hypertension self management and patient's adherence to plan as established by provider Provided assistance with obtaining home blood pressure monitor via [employer]; Advised patient, providing education and rationale, to monitor blood pressure daily and record, calling PCP for findings outside established parameters Provided education on prescribed diet low Sodium Discussed complications of poorly controlled blood pressure such as heart disease, stroke, circulatory complications, vision complications, kidney impairment, sexual dysfunction Mailed printed educational materials related to How to Accurately Monitor BP at Home; What is High Blood Pressure?; BP log; Why Should I Limit Sodium?; African Americans and Stroke             Our next appointment is by telephone on 09/13/22 at 230 PM  Please call the care guide team at [phone] if you need to cancel or reschedule your appointment.   If you are experiencing a Mental Health or [place] or need someone to talk to, please call 1-800-273-TALK (toll free, 24 hour hotline)  The patient verbalized understanding of instructions, educational materials, and care plan provided today and agreed to receive a mailed copy of patient instructions, educational materials, and care plan.   Barb Merino, RN, BSN, CCM Care Management Coordinator [facility] Care Management Direct Phone: [phone]

## 2022-09-08 DIAGNOSIS — M25561 Pain in right knee: Secondary | ICD-10-CM | POA: Diagnosis not present

## 2022-09-08 DIAGNOSIS — M25512 Pain in left shoulder: Secondary | ICD-10-CM | POA: Diagnosis not present

## 2022-09-08 DIAGNOSIS — Z79891 Long term (current) use of opiate analgesic: Secondary | ICD-10-CM | POA: Diagnosis not present

## 2022-09-08 DIAGNOSIS — M542 Cervicalgia: Secondary | ICD-10-CM | POA: Diagnosis not present

## 2022-09-08 DIAGNOSIS — M79672 Pain in left foot: Secondary | ICD-10-CM | POA: Diagnosis not present

## 2022-09-08 DIAGNOSIS — M79605 Pain in left leg: Secondary | ICD-10-CM | POA: Diagnosis not present

## 2022-09-08 DIAGNOSIS — M79671 Pain in right foot: Secondary | ICD-10-CM | POA: Diagnosis not present

## 2022-09-08 DIAGNOSIS — M79604 Pain in right leg: Secondary | ICD-10-CM | POA: Diagnosis not present

## 2022-09-08 DIAGNOSIS — M1712 Unilateral primary osteoarthritis, left knee: Secondary | ICD-10-CM | POA: Diagnosis not present

## 2022-09-08 DIAGNOSIS — M25521 Pain in right elbow: Secondary | ICD-10-CM | POA: Diagnosis not present

## 2022-09-08 DIAGNOSIS — G894 Chronic pain syndrome: Secondary | ICD-10-CM | POA: Diagnosis not present

## 2022-09-08 DIAGNOSIS — E1141 Type 2 diabetes mellitus with diabetic mononeuropathy: Secondary | ICD-10-CM | POA: Diagnosis not present

## 2022-09-08 DIAGNOSIS — M25571 Pain in right ankle and joints of right foot: Secondary | ICD-10-CM | POA: Diagnosis not present

## 2022-09-13 ENCOUNTER — Ambulatory Visit: Payer: Self-pay

## 2022-09-13 NOTE — Patient Outreach (Signed)
  Care Coordination   09/13/2022 Name: Diannia Hogenson MRN: [id number] DOB: [birth year]   Care Coordination Outreach Attempts:  An unsuccessful telephone outreach was attempted for a scheduled appointment today.  Follow Up Plan:  Additional outreach attempts will be made to offer the patient care coordination information and services.   Encounter Outcome:  No Answer   Care Coordination Interventions:  No, not indicated    Barb Merino, RN, BSN, CCM Care Management Coordinator [facility] Care Management Direct Phone: [phone]

## 2022-09-30 ENCOUNTER — Telehealth: Payer: Self-pay | Admitting: *Deleted

## 2022-09-30 NOTE — Progress Notes (Signed)
  Care Coordination Note  09/30/2022 Name: [name] MRN: [id number] DOB: October 02, 1971  Shelby Robertson is a 51 y.o. year old female who is a primary care patient of Minette Brine, [place] and is actively engaged with the care management team. I reached out to [name] by phone today to assist with re-scheduling a follow up visit with the RN Case Manager  Follow up plan: Unsuccessful telephone outreach attempt made. A HIPAA compliant phone message was left for the patient providing contact information and requesting a return call.   [place]  Direct Dial: [phone]

## 2022-10-06 DIAGNOSIS — G894 Chronic pain syndrome: Secondary | ICD-10-CM | POA: Diagnosis not present

## 2022-10-06 DIAGNOSIS — Z79891 Long term (current) use of opiate analgesic: Secondary | ICD-10-CM | POA: Diagnosis not present

## 2022-10-12 NOTE — Progress Notes (Signed)
  Care Coordination Note  10/12/2022 Name: Shelby Robertson MRN: [id number] DOB: Apr 30, 1971  Shelby Robertson is a 51 y.o. year old female who is a primary care patient of Minette Brine, [place] and is actively engaged with the care management team. I reached out to [name] by phone today to assist with re-scheduling a follow up visit with the RN Case Manager  Follow up plan: Unsuccessful telephone outreach attempt made. A HIPAA compliant phone message was left for the patient providing contact information and requesting a return call.   [place]  Direct Dial: [phone]

## 2022-10-15 NOTE — Progress Notes (Signed)
  Care Coordination Note  10/15/2022 Name: [name] MRN: [id number] DOB: 02-15-1971  Shelby Robertson is a 51 y.o. year old female who is a primary care patient of Minette Brine, [place] and is actively engaged with the care management team. I reached out to [name] by phone today to assist with re-scheduling a follow up visit with the RN Case Manager  Follow up plan: Unsuccessful telephone outreach attempt made. A HIPAA compliant phone message was left for the patient providing contact information and requesting a return call.  We have been unable to make contact with the patient for follow up. The care management team is available to follow up with the patient after provider conversation with the patient regarding recommendation for care management engagement and subsequent re-referral to the care management team.   [place]  Direct Dial: [phone]

## 2022-11-03 DIAGNOSIS — G894 Chronic pain syndrome: Secondary | ICD-10-CM | POA: Diagnosis not present

## 2022-11-03 DIAGNOSIS — Z79891 Long term (current) use of opiate analgesic: Secondary | ICD-10-CM | POA: Diagnosis not present

## 2022-11-03 DIAGNOSIS — M25521 Pain in right elbow: Secondary | ICD-10-CM | POA: Diagnosis not present

## 2022-11-03 DIAGNOSIS — M79605 Pain in left leg: Secondary | ICD-10-CM | POA: Diagnosis not present

## 2022-11-03 DIAGNOSIS — M25571 Pain in right ankle and joints of right foot: Secondary | ICD-10-CM | POA: Diagnosis not present

## 2022-11-03 DIAGNOSIS — M542 Cervicalgia: Secondary | ICD-10-CM | POA: Diagnosis not present

## 2022-11-03 DIAGNOSIS — M79672 Pain in left foot: Secondary | ICD-10-CM | POA: Diagnosis not present

## 2022-11-03 DIAGNOSIS — M25512 Pain in left shoulder: Secondary | ICD-10-CM | POA: Diagnosis not present

## 2022-11-03 DIAGNOSIS — M79604 Pain in right leg: Secondary | ICD-10-CM | POA: Diagnosis not present

## 2022-11-03 DIAGNOSIS — M79671 Pain in right foot: Secondary | ICD-10-CM | POA: Diagnosis not present

## 2022-11-03 DIAGNOSIS — E1141 Type 2 diabetes mellitus with diabetic mononeuropathy: Secondary | ICD-10-CM | POA: Diagnosis not present

## 2022-11-03 DIAGNOSIS — M1712 Unilateral primary osteoarthritis, left knee: Secondary | ICD-10-CM | POA: Diagnosis not present

## 2022-11-03 DIAGNOSIS — M25561 Pain in right knee: Secondary | ICD-10-CM | POA: Diagnosis not present

## 2022-11-23 ENCOUNTER — Other Ambulatory Visit: Payer: Self-pay | Admitting: Nurse Practitioner

## 2022-11-23 DIAGNOSIS — E782 Mixed hyperlipidemia: Secondary | ICD-10-CM

## 2022-11-23 DIAGNOSIS — E1169 Type 2 diabetes mellitus with other specified complication: Secondary | ICD-10-CM

## 2022-11-23 DIAGNOSIS — E559 Vitamin D deficiency, unspecified: Secondary | ICD-10-CM

## 2022-12-01 DIAGNOSIS — M79672 Pain in left foot: Secondary | ICD-10-CM | POA: Diagnosis not present

## 2022-12-01 DIAGNOSIS — G894 Chronic pain syndrome: Secondary | ICD-10-CM | POA: Diagnosis not present

## 2022-12-01 DIAGNOSIS — M25561 Pain in right knee: Secondary | ICD-10-CM | POA: Diagnosis not present

## 2022-12-01 DIAGNOSIS — M79605 Pain in left leg: Secondary | ICD-10-CM | POA: Diagnosis not present

## 2022-12-01 DIAGNOSIS — M25571 Pain in right ankle and joints of right foot: Secondary | ICD-10-CM | POA: Diagnosis not present

## 2022-12-01 DIAGNOSIS — M25512 Pain in left shoulder: Secondary | ICD-10-CM | POA: Diagnosis not present

## 2022-12-01 DIAGNOSIS — M1712 Unilateral primary osteoarthritis, left knee: Secondary | ICD-10-CM | POA: Diagnosis not present

## 2022-12-01 DIAGNOSIS — M545 Low back pain, unspecified: Secondary | ICD-10-CM | POA: Diagnosis not present

## 2022-12-01 DIAGNOSIS — M25521 Pain in right elbow: Secondary | ICD-10-CM | POA: Diagnosis not present

## 2022-12-01 DIAGNOSIS — Z79891 Long term (current) use of opiate analgesic: Secondary | ICD-10-CM | POA: Diagnosis not present

## 2022-12-01 DIAGNOSIS — M542 Cervicalgia: Secondary | ICD-10-CM | POA: Diagnosis not present

## 2022-12-01 DIAGNOSIS — M79671 Pain in right foot: Secondary | ICD-10-CM | POA: Diagnosis not present

## 2022-12-01 DIAGNOSIS — M79604 Pain in right leg: Secondary | ICD-10-CM | POA: Diagnosis not present

## 2022-12-29 DIAGNOSIS — M79672 Pain in left foot: Secondary | ICD-10-CM | POA: Diagnosis not present

## 2022-12-29 DIAGNOSIS — M1712 Unilateral primary osteoarthritis, left knee: Secondary | ICD-10-CM | POA: Diagnosis not present

## 2022-12-29 DIAGNOSIS — M79671 Pain in right foot: Secondary | ICD-10-CM | POA: Diagnosis not present

## 2022-12-29 DIAGNOSIS — M542 Cervicalgia: Secondary | ICD-10-CM | POA: Diagnosis not present

## 2022-12-29 DIAGNOSIS — G894 Chronic pain syndrome: Secondary | ICD-10-CM | POA: Diagnosis not present

## 2022-12-29 DIAGNOSIS — M79605 Pain in left leg: Secondary | ICD-10-CM | POA: Diagnosis not present

## 2022-12-29 DIAGNOSIS — M25571 Pain in right ankle and joints of right foot: Secondary | ICD-10-CM | POA: Diagnosis not present

## 2022-12-29 DIAGNOSIS — M25512 Pain in left shoulder: Secondary | ICD-10-CM | POA: Diagnosis not present

## 2022-12-29 DIAGNOSIS — M79604 Pain in right leg: Secondary | ICD-10-CM | POA: Diagnosis not present

## 2022-12-29 DIAGNOSIS — M25561 Pain in right knee: Secondary | ICD-10-CM | POA: Diagnosis not present

## 2022-12-29 DIAGNOSIS — Z79891 Long term (current) use of opiate analgesic: Secondary | ICD-10-CM | POA: Diagnosis not present

## 2022-12-29 DIAGNOSIS — E1141 Type 2 diabetes mellitus with diabetic mononeuropathy: Secondary | ICD-10-CM | POA: Diagnosis not present

## 2022-12-29 DIAGNOSIS — M25521 Pain in right elbow: Secondary | ICD-10-CM | POA: Diagnosis not present

## 2023-01-18 ENCOUNTER — Ambulatory Visit: Payer: Medicare HMO | Admitting: Nurse Practitioner

## 2023-01-25 ENCOUNTER — Encounter: Payer: Self-pay | Admitting: Nurse Practitioner

## 2023-01-25 ENCOUNTER — Ambulatory Visit (INDEPENDENT_AMBULATORY_CARE_PROVIDER_SITE_OTHER): Payer: Medicare HMO | Admitting: Nurse Practitioner

## 2023-01-25 VITALS — BP 178/98 | Temp 98.2°F | Ht 66.0 in | Wt 296.8 lb

## 2023-01-25 DIAGNOSIS — I10 Essential (primary) hypertension: Secondary | ICD-10-CM | POA: Diagnosis not present

## 2023-01-25 DIAGNOSIS — Z6841 Body Mass Index (BMI) 40.0 and over, adult: Secondary | ICD-10-CM

## 2023-01-25 DIAGNOSIS — Z23 Encounter for immunization: Secondary | ICD-10-CM | POA: Diagnosis not present

## 2023-01-25 DIAGNOSIS — E118 Type 2 diabetes mellitus with unspecified complications: Secondary | ICD-10-CM | POA: Diagnosis not present

## 2023-01-25 DIAGNOSIS — M0609 Rheumatoid arthritis without rheumatoid factor, multiple sites: Secondary | ICD-10-CM

## 2023-01-25 DIAGNOSIS — R600 Localized edema: Secondary | ICD-10-CM | POA: Diagnosis not present

## 2023-01-25 DIAGNOSIS — F5101 Primary insomnia: Secondary | ICD-10-CM

## 2023-01-25 DIAGNOSIS — I152 Hypertension secondary to endocrine disorders: Secondary | ICD-10-CM | POA: Diagnosis not present

## 2023-01-25 DIAGNOSIS — R319 Hematuria, unspecified: Secondary | ICD-10-CM | POA: Diagnosis not present

## 2023-01-25 DIAGNOSIS — E1169 Type 2 diabetes mellitus with other specified complication: Secondary | ICD-10-CM | POA: Diagnosis not present

## 2023-01-25 DIAGNOSIS — Z7984 Long term (current) use of oral hypoglycemic drugs: Secondary | ICD-10-CM

## 2023-01-25 DIAGNOSIS — E1159 Type 2 diabetes mellitus with other circulatory complications: Secondary | ICD-10-CM

## 2023-01-25 DIAGNOSIS — R232 Flushing: Secondary | ICD-10-CM

## 2023-01-25 LAB — POC URINALSYSI DIPSTICK (AUTOMATED)
Bilirubin, UA: NEGATIVE
Glucose, UA: NEGATIVE
Ketones, UA: NEGATIVE
Nitrite, UA: POSITIVE
Protein, UA: POSITIVE — AB
Spec Grav, UA: 1.02 (ref 1.010–1.025)
Urobilinogen, UA: 0.2 E.U./dL
pH, UA: 6.5 (ref 5.0–8.0)

## 2023-01-25 MED ORDER — MAGNESIUM 250 MG PO TABS: 1.0000 | ORAL_TABLET | Freq: Every evening | ORAL | 1 refills | Status: AC

## 2023-01-25 MED ORDER — CHLORTHALIDONE 25 MG PO TABS
25.0000 mg | ORAL_TABLET | Freq: Every day | ORAL | 2 refills | Status: AC
Start: 2023-01-25 — End: ?

## 2023-01-25 NOTE — Patient Instructions (Signed)
Melatonin 3mg  at bedtime to help with sleep Magnesium 250 mg with evening meal will help with blood pressure, headaches, anxiety, and depression

## 2023-01-25 NOTE — Progress Notes (Addendum)
I,Sheena H Holbrook,acting as a Neurosurgeon for Arnette Felts, FNP.,have documented all relevant documentation on the behalf of Arnette Felts, FNP,as directed by  Arnette Felts, FNP while in the presence of Arnette Felts, FNP.    Subjective:     Patient ID: Shelby Robertson , female    DOB: 12-Aug-1971 , 52 y.o.   MRN: [id number]   Chief Complaint  Patient presents with   Medical Management of Chronic Issues    HPI  Patient presents today for DM follow up.    Hypertension This is a chronic problem. The current episode started more than 1 year ago. The problem is unchanged. The problem is uncontrolled. Pertinent negatives include no anxiety, chest pain, headaches, palpitations or shortness of breath. There are no associated agents to hypertension. Risk factors for coronary artery disease include obesity and sedentary lifestyle.     Past Medical History:  Diagnosis Date   AMS (altered mental status) 09/17/2019   Diabetes mellitus without complication    Eczema    Fibromyalgia    Fibromyalgia    Hypertension    Osteoarthritis    Rheumatoid arthritis(714.0)    Rheumatoid arthritis(714.0)    Sepsis due to Escherichia coli (E. coli) 11/28/2021     Family History  Problem Relation Age of Onset   Diabetes Mother    Healthy Father    Heart attack Maternal Grandmother    Stroke Maternal Grandmother      Current Outpatient Medications:    acetaminophen (TYLENOL) 325 MG tablet, Take 2 tablets (650 mg total) by mouth every 6 (six) hours as needed for mild pain (or Fever >/= 101)., Disp: , Rfl:    albuterol (PROVENTIL) (2.5 MG/3ML) 0.083% nebulizer solution, Take 3 mLs (2.5 mg total) by nebulization every 6 (six) hours as needed for wheezing or shortness of breath., Disp: 75 mL, Rfl: 12   albuterol (VENTOLIN HFA) 108 (90 Base) MCG/ACT inhaler, Inhale 2 puffs into the lungs every 4 (four) hours as needed for wheezing or shortness of breath (cough, shortness of breath or wheezing.)., Disp: 6.7  g, Rfl: 4   amLODipine (NORVASC) 10 MG tablet, Take 1 tablet (10 mg total) by mouth daily., Disp: 90 tablet, Rfl: 1   aspirin 325 MG tablet, Take 325 mg by mouth every 6 (six) hours as needed (pain)., Disp: , Rfl:    atorvastatin (LIPITOR) 20 MG tablet, TAKE 1 TABLET BY MOUTH EVERY DAY, Disp: 90 tablet, Rfl: 1   B-D TB SYRINGE 1CC/27GX1/2" 27G X 1/2" 1 ML MISC, USE 1 SYRINGE TO INJECT METHOTREXATE INTO THE SKIN ONCE A WEEK., Disp: 12 each, Rfl: 3   blood glucose meter kit and supplies KIT, Dispense based on patient and insurance preference. Use up to two times daily as directed. (FOR ICD-9 250.00, 250.01)., Disp: 1 each, Rfl: 0   cetirizine (ZYRTEC ALLERGY) 10 MG tablet, Take 1 tablet (10 mg total) by mouth daily., Disp: 30 tablet, Rfl: 0   chlorthalidone (HYGROTON) 25 MG tablet, Take 1 tablet (25 mg total) by mouth daily., Disp: 30 tablet, Rfl: 2   Clobetasol Propionate 0.05 % shampoo, Apply onto dry scalp once daily, leave on 10 15 minutes and then rinse and shampoo off., Disp: 118 mL, Rfl: 0   diphenhydrAMINE (BENADRYL) 25 MG tablet, Take 25 mg by mouth every 6 (six) hours as needed for itching, allergies or sleep., Disp: , Rfl:    doxycycline (VIBRAMYCIN) 100 MG capsule, Take 100 mg by mouth 2 (two) times daily., Disp: , Rfl:  DULoxetine (CYMBALTA) 60 MG capsule, Take 60 mg by mouth daily., Disp: , Rfl:    ezetimibe (ZETIA) 10 MG tablet, TAKE 1 TABLET BY MOUTH EVERY DAY (Patient taking differently: Take 10 mg by mouth daily.), Disp: 90 tablet, Rfl: 1   HYDROcodone-acetaminophen (NORCO) 7.5-325 MG tablet, Take 1 tablet by mouth every 6 (six) hours as needed for moderate pain., Disp: , Rfl:    hydrocortisone cream 1 %, Apply to affected area 2 times daily, Disp: 15 g, Rfl: 0   hydrOXYzine (ATARAX/VISTARIL) 25 MG tablet, Take 1 tablet (25 mg total) by mouth every 8 (eight) hours as needed for itching., Disp: 30 tablet, Rfl: 0   Lancets (ONETOUCH DELICA PLUS LANCET33G) MISC, 2 (two) times daily.  use for testing, Disp: , Rfl:    Magnesium 250 MG TABS, Take 1 tablet (250 mg total) by mouth every evening. With meal, Disp: 90 tablet, Rfl: 1   metFORMIN (GLUCOPHAGE) 500 MG tablet, TAKE 1 TABLET BY MOUTH 2 TIMES DAILY WITH A MEAL., Disp: 180 tablet, Rfl: 1   methotrexate 50 MG/2ML injection, Inject 0.8 mls (20 mg total) into the skin weekly., Disp: 10 mL, Rfl: 0   metoprolol tartrate (LOPRESSOR) 50 MG tablet, TAKE 1 TABLET (50 MG TOTAL) BY MOUTH DAILY. PLEASE SCHEDULE APPOINTMENT FOR REFILLS., Disp: 90 tablet, Rfl: 1   ONETOUCH VERIO test strip, TEST TWICE DAILY, Disp: 100 strip, Rfl: 3   tiZANidine (ZANAFLEX) 4 MG tablet, Take 4 mg by mouth every 6 (six) hours., Disp: , Rfl:    triamcinolone ointment (KENALOG) 0.1 %, Apply 1 application topically daily as needed (rash, itching)., Disp: , Rfl:    Vitamin D, Ergocalciferol, (DRISDOL) 1.25 MG (50000 UNIT) CAPS capsule, TAKE 1 CAPSULE BY MOUTH 2 (TWO) TIMES A WEEK. TAKE 1 CAPSULE BY MOUTH ON TUES AND ONE ON FRIDAYS, Disp: 24 capsule, Rfl: 1   Adalimumab (HUMIRA PEN) 40 MG/0.8ML PNKT, INJECT 1 PEN INTO THE SKIN EVERY 14 DAYS (Patient taking differently: Inject 40 mg into the skin every 14 (fourteen) days.), Disp: 2 each, Rfl: 0   No Known Allergies   Review of Systems  Constitutional: Negative.   Respiratory:  Negative for shortness of breath.   Cardiovascular:  Negative for chest pain and palpitations.  Neurological:  Negative for dizziness and headaches.  Hematological: Negative.   Psychiatric/Behavioral: Negative.       Today's Vitals   01/25/23 0856 01/25/23 0949  BP: (!) 152/110 (!) 178/98  Temp: 98.2 F (36.8 C)   TempSrc: Oral   Weight: 296 lb 12.8 oz (134.6 kg)   Height: 5\' 6"  (1.676 m)    Body mass index is 47.9 kg/m.   Objective:  Physical Exam Vitals reviewed.  Constitutional:      General: She is not in acute distress.    Appearance: Normal appearance. She is well-developed. She is obese.  HENT:     Head:  Normocephalic and atraumatic.  Eyes:     Pupils: Pupils are equal, round, and reactive to light.  Cardiovascular:     Rate and Rhythm: Normal rate and regular rhythm.     Pulses: Normal pulses.     Heart sounds: Normal heart sounds. No murmur heard. Pulmonary:     Effort: Pulmonary effort is normal. No respiratory distress.     Breath sounds: Normal breath sounds. No wheezing.  Chest:     Chest wall: No tenderness.  Musculoskeletal:        General: Normal range of motion.  Skin:  General: Skin is warm and dry.     Capillary Refill: Capillary refill takes less than 2 seconds.  Neurological:     General: No focal deficit present.     Mental Status: She is alert and oriented to person, place, and time.     Cranial Nerves: No cranial nerve deficit.     Motor: No weakness.  Psychiatric:        Mood and Affect: Mood normal.        Behavior: Behavior normal.        Thought Content: Thought content normal.        Judgment: Judgment normal.        Assessment And Plan:     1. Uncontrolled hypertension Comments: Blood pressure is not well controlled however repeat was improved. - Brain natriuretic peptide - ECHOCARDIOGRAM COMPLETE; Future - chlorthalidone (HYGROTON) 25 MG tablet; Take 1 tablet (25 mg total) by mouth daily.  Dispense: 30 tablet; Refill: 2  2. Obesity, hypertension and diabetes syndrome Comments: Continue current medications, will check HgbA1c - Hemoglobin A1c - Microalbumin / creatinine urine ratio - POCT Urinalysis Dipstick (Automated)  3. Hot flashes Comments: Will check to see if she is having symptoms of menopause however she is likely perimenopausal - FSH - LH - TSH  4. Rheumatoid arthritis of multiple sites without rheumatoid factor Comments: Continue f/u with Rheumatology  5. Morbid obesity with BMI of 45.0-49.9, adult She is encouraged to strive for BMI less than 30 to decrease cardiac risk. Advised to aim for at least 150 minutes of exercise per  week.  6. Primary insomnia Comments: This may be related to grief, encouraged to take magnesium nightly will help with BP as well. - Magnesium 250 MG TABS; Take 1 tablet (250 mg total) by mouth every evening. With meal  Dispense: 90 tablet; Refill: 1  7. Bilateral lower extremity edema Comments: Will check BNP, non pitting. Advised to wear support socks during the day and elevate lower extremities when possible. - Brain natriuretic peptide - ECHOCARDIOGRAM COMPLETE; Future - chlorthalidone (HYGROTON) 25 MG tablet; Take 1 tablet (25 mg total) by mouth daily.  Dispense: 30 tablet; Refill: 2  8. Hematuria, unspecified type Comments: will send urine culture has moderate blood. - Urine Culture  9. Need for shingles vaccine Received Shingrix #1, return in 2-6 months for #2 - Zoster Recombinant (Shingrix )     Patient was given opportunity to ask questions. Patient verbalized understanding of the plan and was able to repeat key elements of the plan. All questions were answered to their satisfaction.  Arnette Felts, FNP   I, Arnette Felts, FNP, have reviewed all documentation for this visit. The documentation on 01/25/23 for the exam, diagnosis, procedures, and orders are all accurate and complete.   IF YOU HAVE BEEN REFERRED TO A SPECIALIST, IT MAY TAKE 1-2 WEEKS TO SCHEDULE/PROCESS THE REFERRAL. IF YOU HAVE NOT HEARD FROM US/SPECIALIST IN TWO WEEKS, PLEASE GIVE [place] A CALL AT [phone].   THE PATIENT IS ENCOURAGED TO PRACTICE SOCIAL DISTANCING DUE TO THE COVID-19 PANDEMIC.

## 2023-01-26 LAB — BRAIN NATRIURETIC PEPTIDE: BNP: <2.5 pg/mL (ref 0.0–100.0)

## 2023-01-26 LAB — TSH: TSH: 5.44 u[IU]/mL — ABNORMAL HIGH (ref 0.450–4.500)

## 2023-01-26 LAB — HEMOGLOBIN A1C
Est. average glucose Bld gHb Est-mCnc: 120 mg/dL
Hgb A1c MFr Bld: 5.8 % — ABNORMAL HIGH (ref 4.8–5.6)

## 2023-01-26 LAB — MICROALBUMIN / CREATININE URINE RATIO
Creatinine, Urine: 65.4 mg/dL
Microalb/Creat Ratio: 116 mg/g creat — ABNORMAL HIGH (ref 0–29)
Microalbumin, Urine: 75.6 ug/mL

## 2023-01-26 LAB — LUTEINIZING HORMONE: LH: 26.1 m[IU]/mL

## 2023-01-26 LAB — FOLLICLE STIMULATING HORMONE: FSH: 28.2 m[IU]/mL

## 2023-01-28 LAB — URINE CULTURE

## 2023-02-01 ENCOUNTER — Ambulatory Visit: Payer: Medicare HMO

## 2023-02-01 VITALS — BP 140/66 | HR 83 | Temp 98.5°F | Ht 66.0 in | Wt 296.0 lb

## 2023-02-01 DIAGNOSIS — I10 Essential (primary) hypertension: Secondary | ICD-10-CM

## 2023-02-01 NOTE — Progress Notes (Signed)
Patient presents today for a BP check, patient currently taking amLODipine  , chlorthalidone . BP Readings from Last 3 Encounters:  02/01/23 (!) 140/82  01/25/23 (!) 178/98  06/07/22 (!) 166/98  Per provider- return in 2 weeks for nv bp

## 2023-02-02 LAB — BMP8+EGFR
BUN/Creatinine Ratio: 13 (ref 9–23)
BUN/Creatinine Ratio: 13 (ref 9–23)
BUN: 10 mg/dL (ref 6–24)
BUN: 10 mg/dL (ref 6–24)
CO2: 20 mmol/L (ref 20–29)
CO2: 24 mmol/L (ref 20–29)
Calcium: 9.6 mg/dL (ref 8.7–10.2)
Calcium: 9.1 mg/dL (ref 8.7–10.2)
Chloride: 99 mmol/L (ref 96–106)
Chloride: 98 mmol/L (ref 96–106)
Creatinine, Ser: 0.75 mg/dL (ref 0.57–1.00)
Creatinine, Ser: 0.75 mg/dL (ref 0.57–1.00)
Glucose: 92 mg/dL (ref 70–99)
Glucose: 148 mg/dL — ABNORMAL HIGH (ref 70–99)
Potassium: 3.5 mmol/L (ref 3.5–5.2)
Potassium: 3.6 mmol/L (ref 3.5–5.2)
Sodium: 140 mmol/L (ref 134–144)
Sodium: 139 mmol/L (ref 134–144)
eGFR: 97 mL/min/{1.73_m2} (ref >59)
eGFR: 97 mL/min/{1.73_m2} (ref >59)

## 2023-02-02 LAB — SPECIMEN STATUS REPORT

## 2023-02-08 DIAGNOSIS — M25521 Pain in right elbow: Secondary | ICD-10-CM | POA: Diagnosis not present

## 2023-02-08 DIAGNOSIS — G894 Chronic pain syndrome: Secondary | ICD-10-CM | POA: Diagnosis not present

## 2023-02-08 DIAGNOSIS — M79671 Pain in right foot: Secondary | ICD-10-CM | POA: Diagnosis not present

## 2023-02-08 DIAGNOSIS — M1712 Unilateral primary osteoarthritis, left knee: Secondary | ICD-10-CM | POA: Diagnosis not present

## 2023-02-08 DIAGNOSIS — Z79891 Long term (current) use of opiate analgesic: Secondary | ICD-10-CM | POA: Diagnosis not present

## 2023-02-08 DIAGNOSIS — M25571 Pain in right ankle and joints of right foot: Secondary | ICD-10-CM | POA: Diagnosis not present

## 2023-02-08 DIAGNOSIS — M79605 Pain in left leg: Secondary | ICD-10-CM | POA: Diagnosis not present

## 2023-02-08 DIAGNOSIS — M79604 Pain in right leg: Secondary | ICD-10-CM | POA: Diagnosis not present

## 2023-02-08 DIAGNOSIS — M79672 Pain in left foot: Secondary | ICD-10-CM | POA: Diagnosis not present

## 2023-02-08 DIAGNOSIS — G4733 Obstructive sleep apnea (adult) (pediatric): Secondary | ICD-10-CM | POA: Diagnosis not present

## 2023-02-08 DIAGNOSIS — M542 Cervicalgia: Secondary | ICD-10-CM | POA: Diagnosis not present

## 2023-02-08 DIAGNOSIS — M25512 Pain in left shoulder: Secondary | ICD-10-CM | POA: Diagnosis not present

## 2023-02-08 DIAGNOSIS — M25561 Pain in right knee: Secondary | ICD-10-CM | POA: Diagnosis not present

## 2023-02-15 ENCOUNTER — Ambulatory Visit: Payer: Medicare HMO

## 2023-02-15 ENCOUNTER — Ambulatory Visit: Payer: Self-pay

## 2023-02-15 VITALS — BP 120/72 | Temp 98.5°F | Ht 66.0 in | Wt 296.0 lb

## 2023-02-15 DIAGNOSIS — I10 Essential (primary) hypertension: Secondary | ICD-10-CM

## 2023-02-15 NOTE — Progress Notes (Signed)
Patient presents today for a BP check, patient currently taking amLODipine 10mg  AM, chlorthalidone 25mg  AM.  BP Readings from Last 3 Encounters:  02/15/23 (!) 150/100  02/01/23 (!) 140/66  01/25/23 (!) 178/98  Patient reported stepping in a hole and hurting her ankle. Patient declined appt she states she took muscle relaxer and will be better after her arthritis shot.  Provider- no changes due to pain f/u  next visit.

## 2023-02-15 NOTE — Progress Notes (Signed)
NO SHOW

## 2023-02-15 NOTE — Patient Instructions (Signed)
Hypertension, Adult ?Hypertension is another name for high blood pressure. High blood pressure forces your heart to work harder to pump blood. This can cause problems over time. ?There are two numbers in a blood pressure reading. There is a top number (systolic) over a bottom number (diastolic). It is best to have a blood pressure that is below 120/80. ?What are the causes? ?The cause of this condition is not known. Some other conditions can lead to high blood pressure. ?What increases the risk? ?Some lifestyle factors can make you more likely to develop high blood pressure: ?Smoking. ?Not getting enough exercise or physical activity. ?Being overweight. ?Having too much fat, sugar, calories, or salt (sodium) in your diet. ?Drinking too much alcohol. ?Other risk factors include: ?Having any of these conditions: ?Heart disease. ?Diabetes. ?High cholesterol. ?Kidney disease. ?Obstructive sleep apnea. ?Having a family history of high blood pressure and high cholesterol. ?Age. The risk increases with age. ?Stress. ?What are the signs or symptoms? ?High blood pressure may not cause symptoms. Very high blood pressure (hypertensive crisis) may cause: ?Headache. ?Fast or uneven heartbeats (palpitations). ?Shortness of breath. ?Nosebleed. ?Vomiting or feeling like you may vomit (nauseous). ?Changes in how you see. ?Very bad chest pain. ?Feeling dizzy. ?Seizures. ?How is this treated? ?This condition is treated by making healthy lifestyle changes, such as: ?Eating healthy foods. ?Exercising more. ?Drinking less alcohol. ?Your doctor may prescribe medicine if lifestyle changes do not help enough and if: ?Your top number is above 130. ?Your bottom number is above 80. ?Your personal target blood pressure may vary. ?Follow these instructions at home: ?Eating and drinking ? ?If told, follow the DASH eating plan. To follow this plan: ?Fill one half of your plate at each meal with fruits and vegetables. ?Fill one fourth of your plate  at each meal with whole grains. Whole grains include whole-wheat pasta, brown rice, and whole-grain bread. ?Eat or drink low-fat dairy products, such as skim milk or low-fat yogurt. ?Fill one fourth of your plate at each meal with low-fat (lean) proteins. Low-fat proteins include fish, chicken without skin, eggs, beans, and tofu. ?Avoid fatty meat, cured and processed meat, or chicken with skin. ?Avoid pre-made or processed food. ?Limit the amount of salt in your diet to less than 1,500 mg each day. ?Do not drink alcohol if: ?Your doctor tells you not to drink. ?You are pregnant, may be pregnant, or are planning to become pregnant. ?If you drink alcohol: ?Limit how much you have to: ?0-1 drink a day for women. ?0-2 drinks a day for men. ?Know how much alcohol is in your drink. In the [place] one drink equals one 12 oz bottle of beer (355 mL), one 5 oz glass of wine (148 mL), or one 1? oz glass of hard liquor (44 mL). ?Lifestyle ? ?Work with your doctor to stay at a healthy weight or to lose weight. Ask your doctor what the best weight is for you. ?Get at least 30 minutes of exercise that causes your heart to beat faster (aerobic exercise) most days of the week. This may include walking, swimming, or biking. ?Get at least 30 minutes of exercise that strengthens your muscles (resistance exercise) at least 3 days a week. This may include lifting weights or doing Pilates. ?Do not smoke or use any products that contain nicotine or tobacco. If you need help quitting, ask your doctor. ?Check your blood pressure at home as told by your doctor. ?Keep all follow-up visits. ?Medicines ?Take over-the-counter and prescription medicines   only as told by your doctor. Follow directions carefully. ?Do not skip doses of blood pressure medicine. The medicine does not work as well if you skip doses. Skipping doses also puts you at risk for problems. ?Ask your doctor about side effects or reactions to medicines that you should watch  for. ?Contact a doctor if: ?You think you are having a reaction to the medicine you are taking. ?You have headaches that keep coming back. ?You feel dizzy. ?You have swelling in your ankles. ?You have trouble with your vision. ?Get help right away if: ?You get a very bad headache. ?You start to feel mixed up (confused). ?You feel weak or numb. ?You feel faint. ?You have very bad pain in your: ?Chest. ?Belly (abdomen). ?You vomit more than once. ?You have trouble breathing. ?These symptoms may be an emergency. Get help right away. Call 911. ?Do not wait to see if the symptoms will go away. ?Do not drive yourself to the hospital. ?Summary ?Hypertension is another name for high blood pressure. ?High blood pressure forces your heart to work harder to pump blood. ?For most people, a normal blood pressure is less than 120/80. ?Making healthy choices can help lower blood pressure. If your blood pressure does not get lower with healthy choices, you may need to take medicine. ?This information is not intended to replace advice given to you by your health care provider. Make sure you discuss any questions you have with your health care provider. ?Document Revised: 07/23/2021 Document Reviewed: 07/23/2021 ?Elsevier Patient Education ? 2023 [employer] ? ?

## 2023-02-16 ENCOUNTER — Telehealth: Payer: Self-pay

## 2023-02-16 NOTE — Telephone Encounter (Signed)
Call pt to make aware diabetic eye exam has been scheduled for 04/15/2023 here at [facility]. Pt didn't answer and mailbox was full.

## 2023-02-21 ENCOUNTER — Ambulatory Visit (HOSPITAL_COMMUNITY): Payer: Medicare HMO | Attending: Nurse Practitioner

## 2023-02-21 DIAGNOSIS — I10 Essential (primary) hypertension: Secondary | ICD-10-CM

## 2023-02-21 DIAGNOSIS — R600 Localized edema: Secondary | ICD-10-CM

## 2023-02-21 LAB — ECHOCARDIOGRAM COMPLETE
Area-P 1/2: 5.5 cm2
S' Lateral: 1.5 cm

## 2023-02-21 MED ORDER — PERFLUTREN LIPID MICROSPHERE
1.0000 mL | INTRAVENOUS | Status: AC | PRN
Start: 2023-02-21 — End: 2023-02-21
  Administered 2023-02-21: 2 mL via INTRAVENOUS

## 2023-03-08 ENCOUNTER — Encounter: Payer: Self-pay | Admitting: Nurse Practitioner

## 2023-03-08 ENCOUNTER — Other Ambulatory Visit: Payer: Self-pay | Admitting: Nurse Practitioner

## 2023-03-08 DIAGNOSIS — M542 Cervicalgia: Secondary | ICD-10-CM | POA: Diagnosis not present

## 2023-03-08 DIAGNOSIS — G4733 Obstructive sleep apnea (adult) (pediatric): Secondary | ICD-10-CM | POA: Diagnosis not present

## 2023-03-08 DIAGNOSIS — M79671 Pain in right foot: Secondary | ICD-10-CM | POA: Diagnosis not present

## 2023-03-08 DIAGNOSIS — M25561 Pain in right knee: Secondary | ICD-10-CM | POA: Diagnosis not present

## 2023-03-08 DIAGNOSIS — M79672 Pain in left foot: Secondary | ICD-10-CM | POA: Diagnosis not present

## 2023-03-08 DIAGNOSIS — G894 Chronic pain syndrome: Secondary | ICD-10-CM | POA: Diagnosis not present

## 2023-03-08 DIAGNOSIS — M25521 Pain in right elbow: Secondary | ICD-10-CM | POA: Diagnosis not present

## 2023-03-08 DIAGNOSIS — M25571 Pain in right ankle and joints of right foot: Secondary | ICD-10-CM | POA: Diagnosis not present

## 2023-03-08 DIAGNOSIS — M1712 Unilateral primary osteoarthritis, left knee: Secondary | ICD-10-CM | POA: Diagnosis not present

## 2023-03-08 DIAGNOSIS — M545 Low back pain, unspecified: Secondary | ICD-10-CM | POA: Diagnosis not present

## 2023-03-08 DIAGNOSIS — M79605 Pain in left leg: Secondary | ICD-10-CM | POA: Diagnosis not present

## 2023-03-08 DIAGNOSIS — Z79891 Long term (current) use of opiate analgesic: Secondary | ICD-10-CM | POA: Diagnosis not present

## 2023-03-08 DIAGNOSIS — Z79899 Other long term (current) drug therapy: Secondary | ICD-10-CM | POA: Diagnosis not present

## 2023-03-08 DIAGNOSIS — M79604 Pain in right leg: Secondary | ICD-10-CM | POA: Diagnosis not present

## 2023-03-08 MED ORDER — NITROFURANTOIN MONOHYD MACRO 100 MG PO CAPS: 100.0000 mg | ORAL_CAPSULE | Freq: Two times a day (BID) | ORAL | 0 refills | Status: AC

## 2023-04-05 DIAGNOSIS — M79605 Pain in left leg: Secondary | ICD-10-CM | POA: Diagnosis not present

## 2023-04-05 DIAGNOSIS — Z79891 Long term (current) use of opiate analgesic: Secondary | ICD-10-CM | POA: Diagnosis not present

## 2023-04-05 DIAGNOSIS — M25512 Pain in left shoulder: Secondary | ICD-10-CM | POA: Diagnosis not present

## 2023-04-05 DIAGNOSIS — M79604 Pain in right leg: Secondary | ICD-10-CM | POA: Diagnosis not present

## 2023-04-05 DIAGNOSIS — M25561 Pain in right knee: Secondary | ICD-10-CM | POA: Diagnosis not present

## 2023-04-05 DIAGNOSIS — M79672 Pain in left foot: Secondary | ICD-10-CM | POA: Diagnosis not present

## 2023-04-05 DIAGNOSIS — M79671 Pain in right foot: Secondary | ICD-10-CM | POA: Diagnosis not present

## 2023-04-05 DIAGNOSIS — M1712 Unilateral primary osteoarthritis, left knee: Secondary | ICD-10-CM | POA: Diagnosis not present

## 2023-04-05 DIAGNOSIS — M25521 Pain in right elbow: Secondary | ICD-10-CM | POA: Diagnosis not present

## 2023-04-05 DIAGNOSIS — M545 Low back pain, unspecified: Secondary | ICD-10-CM | POA: Diagnosis not present

## 2023-04-05 DIAGNOSIS — M25571 Pain in right ankle and joints of right foot: Secondary | ICD-10-CM | POA: Diagnosis not present

## 2023-04-05 DIAGNOSIS — G894 Chronic pain syndrome: Secondary | ICD-10-CM | POA: Diagnosis not present

## 2023-04-05 DIAGNOSIS — M542 Cervicalgia: Secondary | ICD-10-CM | POA: Diagnosis not present

## 2023-04-05 DIAGNOSIS — Z79899 Other long term (current) drug therapy: Secondary | ICD-10-CM | POA: Diagnosis not present

## 2023-05-03 DIAGNOSIS — M79605 Pain in left leg: Secondary | ICD-10-CM | POA: Diagnosis not present

## 2023-05-03 DIAGNOSIS — M79604 Pain in right leg: Secondary | ICD-10-CM | POA: Diagnosis not present

## 2023-05-03 DIAGNOSIS — M25521 Pain in right elbow: Secondary | ICD-10-CM | POA: Diagnosis not present

## 2023-05-03 DIAGNOSIS — M79671 Pain in right foot: Secondary | ICD-10-CM | POA: Diagnosis not present

## 2023-05-03 DIAGNOSIS — M25512 Pain in left shoulder: Secondary | ICD-10-CM | POA: Diagnosis not present

## 2023-05-03 DIAGNOSIS — Z79891 Long term (current) use of opiate analgesic: Secondary | ICD-10-CM | POA: Diagnosis not present

## 2023-05-03 DIAGNOSIS — M1712 Unilateral primary osteoarthritis, left knee: Secondary | ICD-10-CM | POA: Diagnosis not present

## 2023-05-03 DIAGNOSIS — M542 Cervicalgia: Secondary | ICD-10-CM | POA: Diagnosis not present

## 2023-05-03 DIAGNOSIS — M545 Low back pain, unspecified: Secondary | ICD-10-CM | POA: Diagnosis not present

## 2023-05-03 DIAGNOSIS — M79672 Pain in left foot: Secondary | ICD-10-CM | POA: Diagnosis not present

## 2023-05-03 DIAGNOSIS — Z79899 Other long term (current) drug therapy: Secondary | ICD-10-CM | POA: Diagnosis not present

## 2023-05-03 DIAGNOSIS — G894 Chronic pain syndrome: Secondary | ICD-10-CM | POA: Diagnosis not present

## 2023-05-30 ENCOUNTER — Ambulatory Visit: Payer: Self-pay | Admitting: Nurse Practitioner

## 2023-06-05 ENCOUNTER — Inpatient Hospital Stay (HOSPITAL_COMMUNITY)
Admission: EM | Admit: 2023-06-05 | Discharge: 2023-06-10 | DRG: 871 | Disposition: A | Discharge status: Home / Self Care | Payer: Medicare HMO | Attending: Internal Medicine | Admitting: Internal Medicine

## 2023-06-05 ENCOUNTER — Other Ambulatory Visit: Payer: Self-pay

## 2023-06-05 ENCOUNTER — Encounter (HOSPITAL_COMMUNITY): Payer: Self-pay | Admitting: Emergency Medicine

## 2023-06-05 DIAGNOSIS — N12 Tubulo-interstitial nephritis, not specified as acute or chronic: Secondary | ICD-10-CM | POA: Diagnosis not present

## 2023-06-05 DIAGNOSIS — N200 Calculus of kidney: Secondary | ICD-10-CM | POA: Diagnosis present

## 2023-06-05 DIAGNOSIS — E785 Hyperlipidemia, unspecified: Secondary | ICD-10-CM | POA: Diagnosis present

## 2023-06-05 DIAGNOSIS — Z23 Encounter for immunization: Secondary | ICD-10-CM

## 2023-06-05 DIAGNOSIS — Z716 Tobacco abuse counseling: Secondary | ICD-10-CM

## 2023-06-05 DIAGNOSIS — I471 Supraventricular tachycardia, unspecified: Secondary | ICD-10-CM | POA: Diagnosis present

## 2023-06-05 DIAGNOSIS — R8271 Bacteriuria: Secondary | ICD-10-CM | POA: Diagnosis present

## 2023-06-05 DIAGNOSIS — M1990 Unspecified osteoarthritis, unspecified site: Secondary | ICD-10-CM | POA: Diagnosis present

## 2023-06-05 DIAGNOSIS — M069 Rheumatoid arthritis, unspecified: Secondary | ICD-10-CM | POA: Diagnosis present

## 2023-06-05 DIAGNOSIS — M797 Fibromyalgia: Secondary | ICD-10-CM | POA: Diagnosis present

## 2023-06-05 DIAGNOSIS — A419 Sepsis, unspecified organism: Principal | ICD-10-CM | POA: Diagnosis present

## 2023-06-05 DIAGNOSIS — J9811 Atelectasis: Secondary | ICD-10-CM | POA: Diagnosis present

## 2023-06-05 DIAGNOSIS — B9620 Unspecified Escherichia coli [E. coli] as the cause of diseases classified elsewhere: Secondary | ICD-10-CM | POA: Diagnosis present

## 2023-06-05 DIAGNOSIS — R7989 Other specified abnormal findings of blood chemistry: Secondary | ICD-10-CM | POA: Diagnosis present

## 2023-06-05 DIAGNOSIS — R17 Unspecified jaundice: Secondary | ICD-10-CM | POA: Diagnosis present

## 2023-06-05 DIAGNOSIS — R319 Hematuria, unspecified: Secondary | ICD-10-CM | POA: Diagnosis present

## 2023-06-05 DIAGNOSIS — Z7982 Long term (current) use of aspirin: Secondary | ICD-10-CM

## 2023-06-05 DIAGNOSIS — F17210 Nicotine dependence, cigarettes, uncomplicated: Secondary | ICD-10-CM | POA: Diagnosis present

## 2023-06-05 DIAGNOSIS — G4733 Obstructive sleep apnea (adult) (pediatric): Secondary | ICD-10-CM | POA: Diagnosis present

## 2023-06-05 DIAGNOSIS — I10 Essential (primary) hypertension: Secondary | ICD-10-CM | POA: Diagnosis present

## 2023-06-05 DIAGNOSIS — J9601 Acute respiratory failure with hypoxia: Secondary | ICD-10-CM | POA: Diagnosis present

## 2023-06-05 DIAGNOSIS — Z79899 Other long term (current) drug therapy: Secondary | ICD-10-CM

## 2023-06-05 DIAGNOSIS — E876 Hypokalemia: Secondary | ICD-10-CM | POA: Diagnosis present

## 2023-06-05 DIAGNOSIS — Z833 Family history of diabetes mellitus: Secondary | ICD-10-CM

## 2023-06-05 DIAGNOSIS — E118 Type 2 diabetes mellitus with unspecified complications: Secondary | ICD-10-CM

## 2023-06-05 DIAGNOSIS — Z8249 Family history of ischemic heart disease and other diseases of the circulatory system: Secondary | ICD-10-CM

## 2023-06-05 DIAGNOSIS — Z823 Family history of stroke: Secondary | ICD-10-CM

## 2023-06-05 DIAGNOSIS — L309 Dermatitis, unspecified: Secondary | ICD-10-CM | POA: Diagnosis present

## 2023-06-05 DIAGNOSIS — E1165 Type 2 diabetes mellitus with hyperglycemia: Secondary | ICD-10-CM | POA: Diagnosis present

## 2023-06-05 DIAGNOSIS — N179 Acute kidney failure, unspecified: Secondary | ICD-10-CM | POA: Diagnosis present

## 2023-06-05 DIAGNOSIS — N390 Urinary tract infection, site not specified: Secondary | ICD-10-CM | POA: Diagnosis present

## 2023-06-05 DIAGNOSIS — Z7984 Long term (current) use of oral hypoglycemic drugs: Secondary | ICD-10-CM

## 2023-06-05 DIAGNOSIS — R6520 Severe sepsis without septic shock: Secondary | ICD-10-CM | POA: Diagnosis present

## 2023-06-05 DIAGNOSIS — Z6841 Body Mass Index (BMI) 40.0 and over, adult: Secondary | ICD-10-CM

## 2023-06-05 LAB — URINALYSIS, W/ REFLEX TO CULTURE (INFECTION SUSPECTED): RBC / HPF: >50 RBC/hpf (ref 0–5)

## 2023-06-05 NOTE — ED Triage Notes (Signed)
Pt reports bilateral back and flank pain with blood in urine and frequency x 2 days, denies burning

## 2023-06-06 ENCOUNTER — Inpatient Hospital Stay (HOSPITAL_COMMUNITY): Payer: Medicare HMO

## 2023-06-06 ENCOUNTER — Observation Stay (HOSPITAL_COMMUNITY): Payer: Medicare HMO

## 2023-06-06 ENCOUNTER — Emergency Department (HOSPITAL_COMMUNITY): Payer: Medicare HMO

## 2023-06-06 DIAGNOSIS — M797 Fibromyalgia: Secondary | ICD-10-CM | POA: Diagnosis present

## 2023-06-06 DIAGNOSIS — J9811 Atelectasis: Secondary | ICD-10-CM | POA: Diagnosis present

## 2023-06-06 DIAGNOSIS — N200 Calculus of kidney: Secondary | ICD-10-CM | POA: Diagnosis present

## 2023-06-06 DIAGNOSIS — N12 Tubulo-interstitial nephritis, not specified as acute or chronic: Secondary | ICD-10-CM | POA: Diagnosis present

## 2023-06-06 DIAGNOSIS — R6520 Severe sepsis without septic shock: Secondary | ICD-10-CM | POA: Diagnosis present

## 2023-06-06 DIAGNOSIS — E785 Hyperlipidemia, unspecified: Secondary | ICD-10-CM | POA: Diagnosis present

## 2023-06-06 DIAGNOSIS — E876 Hypokalemia: Secondary | ICD-10-CM | POA: Diagnosis present

## 2023-06-06 DIAGNOSIS — N179 Acute kidney failure, unspecified: Secondary | ICD-10-CM | POA: Diagnosis present

## 2023-06-06 DIAGNOSIS — L309 Dermatitis, unspecified: Secondary | ICD-10-CM | POA: Diagnosis present

## 2023-06-06 DIAGNOSIS — M1990 Unspecified osteoarthritis, unspecified site: Secondary | ICD-10-CM | POA: Diagnosis present

## 2023-06-06 DIAGNOSIS — F17210 Nicotine dependence, cigarettes, uncomplicated: Secondary | ICD-10-CM | POA: Diagnosis present

## 2023-06-06 DIAGNOSIS — M069 Rheumatoid arthritis, unspecified: Secondary | ICD-10-CM | POA: Diagnosis present

## 2023-06-06 DIAGNOSIS — A419 Sepsis, unspecified organism: Secondary | ICD-10-CM

## 2023-06-06 DIAGNOSIS — E1165 Type 2 diabetes mellitus with hyperglycemia: Secondary | ICD-10-CM | POA: Diagnosis present

## 2023-06-06 DIAGNOSIS — I10 Essential (primary) hypertension: Secondary | ICD-10-CM | POA: Diagnosis present

## 2023-06-06 DIAGNOSIS — G4733 Obstructive sleep apnea (adult) (pediatric): Secondary | ICD-10-CM | POA: Diagnosis present

## 2023-06-06 DIAGNOSIS — Z6841 Body Mass Index (BMI) 40.0 and over, adult: Secondary | ICD-10-CM | POA: Diagnosis not present

## 2023-06-06 DIAGNOSIS — R17 Unspecified jaundice: Secondary | ICD-10-CM | POA: Diagnosis present

## 2023-06-06 DIAGNOSIS — I471 Supraventricular tachycardia, unspecified: Secondary | ICD-10-CM | POA: Diagnosis present

## 2023-06-06 DIAGNOSIS — N390 Urinary tract infection, site not specified: Secondary | ICD-10-CM | POA: Diagnosis present

## 2023-06-06 DIAGNOSIS — J9601 Acute respiratory failure with hypoxia: Secondary | ICD-10-CM | POA: Diagnosis present

## 2023-06-06 DIAGNOSIS — Z23 Encounter for immunization: Secondary | ICD-10-CM | POA: Diagnosis not present

## 2023-06-06 LAB — URINALYSIS, ROUTINE W REFLEX MICROSCOPIC

## 2023-06-06 LAB — RESPIRATORY PANEL BY PCR

## 2023-06-06 LAB — BLOOD GAS, ARTERIAL
Acid-Base Excess: 1.2 mmol/L (ref 0.0–2.0)
Bicarbonate: 25 mmol/L (ref 20.0–28.0)
O2 Saturation: 98.3 %
Patient temperature: 38.4
pCO2 arterial: 38 mmHg (ref 32–48)
pH, Arterial: 7.43 (ref 7.35–7.45)
pO2, Arterial: 100 mmHg (ref 83–108)

## 2023-06-06 LAB — COMPREHENSIVE METABOLIC PANEL
ALT: 17 U/L (ref 0–44)
AST: 15 U/L (ref 15–41)
Albumin: 3.1 g/dL — ABNORMAL LOW (ref 3.5–5.0)
Alkaline Phosphatase: 62 U/L (ref 38–126)
Anion gap: 12 (ref 5–15)
BUN: 10 mg/dL (ref 6–20)
CO2: 21 mmol/L — ABNORMAL LOW (ref 22–32)
Calcium: 8.3 mg/dL — ABNORMAL LOW (ref 8.9–10.3)
Chloride: 102 mmol/L (ref 98–111)
Creatinine, Ser: 1.19 mg/dL — ABNORMAL HIGH (ref 0.44–1.00)
GFR, Estimated: 56 mL/min — ABNORMAL LOW (ref >60)
Glucose, Bld: 167 mg/dL — ABNORMAL HIGH (ref 70–99)
Potassium: 3.4 mmol/L — ABNORMAL LOW (ref 3.5–5.1)
Sodium: 135 mmol/L (ref 135–145)
Total Bilirubin: 1.7 mg/dL — ABNORMAL HIGH (ref 0.3–1.2)
Total Protein: 7.4 g/dL (ref 6.5–8.1)

## 2023-06-06 LAB — CBC
HCT: 45.9 % (ref 36.0–46.0)
Hemoglobin: 15.1 g/dL — ABNORMAL HIGH (ref 12.0–15.0)
MCH: 29.4 pg (ref 26.0–34.0)
MCHC: 32.9 g/dL (ref 30.0–36.0)
MCV: 89.5 fL (ref 80.0–100.0)
Platelets: 268 10*3/uL (ref 150–400)
RBC: 5.13 MIL/uL — ABNORMAL HIGH (ref 3.87–5.11)
RDW: 14.9 % (ref 11.5–15.5)
WBC: 18.1 10*3/uL — ABNORMAL HIGH (ref 4.0–10.5)
nRBC: 0 % (ref 0.0–0.2)

## 2023-06-06 LAB — URINALYSIS, MICROSCOPIC (REFLEX)
Bacteria, UA: NONE SEEN
RBC / HPF: >50 RBC/hpf (ref 0–5)

## 2023-06-06 LAB — I-STAT CHEM 8, ED
BUN: 11 mg/dL (ref 6–20)
Calcium, Ion: 0.66 mmol/L — CL (ref 1.15–1.40)
Chloride: 115 mmol/L — ABNORMAL HIGH (ref 98–111)
Creatinine, Ser: 0.7 mg/dL (ref 0.44–1.00)
Glucose, Bld: 90 mg/dL (ref 70–99)
HCT: 30 % — ABNORMAL LOW (ref 36.0–46.0)
Hemoglobin: 10.2 g/dL — ABNORMAL LOW (ref 12.0–15.0)
Potassium: 4.2 mmol/L (ref 3.5–5.1)
Sodium: 140 mmol/L (ref 135–145)
TCO2: 17 mmol/L — ABNORMAL LOW (ref 22–32)

## 2023-06-06 LAB — PREGNANCY, URINE: Preg Test, Ur: NEGATIVE

## 2023-06-06 LAB — GLUCOSE, CAPILLARY
Glucose-Capillary: 185 mg/dL — ABNORMAL HIGH (ref 70–99)
Glucose-Capillary: 174 mg/dL — ABNORMAL HIGH (ref 70–99)
Glucose-Capillary: 151 mg/dL — ABNORMAL HIGH (ref 70–99)
Glucose-Capillary: 150 mg/dL — ABNORMAL HIGH (ref 70–99)

## 2023-06-06 LAB — LIPASE, BLOOD: Lipase: 20 U/L (ref 11–51)

## 2023-06-06 LAB — PHOSPHORUS: Phosphorus: 2.6 mg/dL (ref 2.5–4.6)

## 2023-06-06 LAB — APTT: aPTT: 36 seconds (ref 24–36)

## 2023-06-06 LAB — D-DIMER, QUANTITATIVE: D-Dimer, Quant: 3.25 ug{FEU}/mL — ABNORMAL HIGH (ref 0.00–0.50)

## 2023-06-06 LAB — PROTIME-INR
INR: 1.3 — ABNORMAL HIGH (ref 0.8–1.2)
Prothrombin Time: 16.1 seconds — ABNORMAL HIGH (ref 11.4–15.2)

## 2023-06-06 LAB — LACTIC ACID, PLASMA
Lactic Acid, Venous: 1.6 mmol/L (ref 0.5–1.9)
Lactic Acid, Venous: 1.1 mmol/L (ref 0.5–1.9)
Lactic Acid, Venous: 1.2 mmol/L (ref 0.5–1.9)

## 2023-06-06 LAB — MAGNESIUM: Magnesium: 1.5 mg/dL — ABNORMAL LOW (ref 1.7–2.4)

## 2023-06-06 LAB — HIV ANTIBODY (ROUTINE TESTING W REFLEX): HIV Screen 4th Generation wRfx: NONREACTIVE

## 2023-06-06 MED ORDER — MORPHINE SULFATE (PF) 2 MG/ML IV SOLN
1.0000 mg | Freq: Four times a day (QID) | INTRAVENOUS | Status: DC | PRN
  Administered 2023-06-06: 1 mg via INTRAVENOUS
  Filled 2023-06-06: qty 1

## 2023-06-06 MED ORDER — TIZANIDINE HCL 4 MG PO TABS
4.0000 mg | ORAL_TABLET | Freq: Four times a day (QID) | ORAL | Status: DC
  Administered 2023-06-06 – 2023-06-10 (×15): 4 mg via ORAL
  Filled 2023-06-06 (×20): qty 1

## 2023-06-06 MED ORDER — ALBUTEROL SULFATE (2.5 MG/3ML) 0.083% IN NEBU
2.5000 mg | INHALATION_SOLUTION | Freq: Four times a day (QID) | RESPIRATORY_TRACT | Status: DC | PRN
  Filled 2023-06-06: qty 3

## 2023-06-06 MED ORDER — HEPARIN SODIUM (PORCINE) 5000 UNIT/ML IJ SOLN
5000.0000 [IU] | Freq: Three times a day (TID) | INTRAMUSCULAR | Status: DC
  Administered 2023-06-06 – 2023-06-07 (×2): 5000 [IU] via SUBCUTANEOUS
  Filled 2023-06-06 (×2): qty 1

## 2023-06-06 MED ORDER — TAMSULOSIN HCL 0.4 MG PO CAPS
0.4000 mg | ORAL_CAPSULE | Freq: Every day | ORAL | Status: DC
  Administered 2023-06-06: 0.4 mg via ORAL
  Filled 2023-06-06: qty 1

## 2023-06-06 MED ORDER — MORPHINE SULFATE (PF) 4 MG/ML IV SOLN
4.0000 mg | Freq: Once | INTRAVENOUS | Status: AC
  Administered 2023-06-06: 4 mg via INTRAVENOUS
  Filled 2023-06-06: qty 1

## 2023-06-06 MED ORDER — SODIUM CHLORIDE 0.9 % IV SOLN
2.0000 g | Freq: Three times a day (TID) | INTRAVENOUS | Status: DC
  Administered 2023-06-06 – 2023-06-07 (×2): 2 g via INTRAVENOUS
  Filled 2023-06-06 (×2): qty 12.5

## 2023-06-06 MED ORDER — HYDROCODONE-ACETAMINOPHEN 7.5-325 MG PO TABS
1.0000 | ORAL_TABLET | Freq: Four times a day (QID) | ORAL | Status: DC | PRN
  Administered 2023-06-06 – 2023-06-09 (×4): 1 via ORAL
  Filled 2023-06-06 (×5): qty 1

## 2023-06-06 MED ORDER — MAGNESIUM SULFATE 2 GM/50ML IV SOLN
2.0000 g | Freq: Once | INTRAVENOUS | Status: AC
  Administered 2023-06-06: 2 g via INTRAVENOUS
  Filled 2023-06-06: qty 50

## 2023-06-06 MED ORDER — METOPROLOL TARTRATE 25 MG PO TABS
50.0000 mg | ORAL_TABLET | Freq: Every day | ORAL | Status: DC
  Administered 2023-06-06 – 2023-06-09 (×4): 50 mg via ORAL
  Filled 2023-06-06 (×2): qty 2
  Filled 2023-06-06: qty 1
  Filled 2023-06-06: qty 2

## 2023-06-06 MED ORDER — DULOXETINE HCL 60 MG PO CPEP
60.0000 mg | ORAL_CAPSULE | Freq: Every day | ORAL | Status: DC
  Administered 2023-06-06 – 2023-06-10 (×5): 60 mg via ORAL
  Filled 2023-06-06 (×5): qty 1

## 2023-06-06 MED ORDER — SENNOSIDES-DOCUSATE SODIUM 8.6-50 MG PO TABS
1.0000 | ORAL_TABLET | Freq: Every evening | ORAL | Status: DC | PRN
  Administered 2023-06-08: 1 via ORAL
  Filled 2023-06-06: qty 1

## 2023-06-06 MED ORDER — MAGNESIUM SULFATE IN D5W 1-5 GM/100ML-% IV SOLN
1.0000 g | Freq: Once | INTRAVENOUS | Status: AC
  Administered 2023-06-06: 1 g via INTRAVENOUS
  Filled 2023-06-06: qty 100

## 2023-06-06 MED ORDER — AMLODIPINE BESYLATE 10 MG PO TABS
10.0000 mg | ORAL_TABLET | Freq: Every day | ORAL | Status: DC
  Administered 2023-06-06 – 2023-06-09 (×4): 10 mg via ORAL
  Filled 2023-06-06 (×4): qty 1

## 2023-06-06 MED ORDER — SODIUM CHLORIDE 0.9 % IV SOLN
2.0000 g | Freq: Once | INTRAVENOUS | Status: AC
  Administered 2023-06-06: 2 g via INTRAVENOUS
  Filled 2023-06-06: qty 20

## 2023-06-06 MED ORDER — TRAZODONE HCL 50 MG PO TABS
25.0000 mg | ORAL_TABLET | Freq: Every evening | ORAL | Status: DC | PRN
  Administered 2023-06-06 – 2023-06-09 (×3): 25 mg via ORAL
  Filled 2023-06-06 (×3): qty 1

## 2023-06-06 MED ORDER — CHLORHEXIDINE GLUCONATE CLOTH 2 % EX PADS
6.0000 | MEDICATED_PAD | Freq: Every day | CUTANEOUS | Status: DC
  Administered 2023-06-06 – 2023-06-10 (×4): 6 via TOPICAL

## 2023-06-06 MED ORDER — INSULIN ASPART 100 UNIT/ML IJ SOLN
0.0000 [IU] | Freq: Three times a day (TID) | INTRAMUSCULAR | Status: DC
  Administered 2023-06-07 (×3): 2 [IU] via SUBCUTANEOUS
  Administered 2023-06-08: 3 [IU] via SUBCUTANEOUS
  Administered 2023-06-08: 2 [IU] via SUBCUTANEOUS
  Administered 2023-06-08: 3 [IU] via SUBCUTANEOUS
  Administered 2023-06-09: 2 [IU] via SUBCUTANEOUS
  Administered 2023-06-09 – 2023-06-10 (×3): 3 [IU] via SUBCUTANEOUS

## 2023-06-06 MED ORDER — ACETAMINOPHEN 325 MG PO TABS
650.0000 mg | ORAL_TABLET | Freq: Four times a day (QID) | ORAL | Status: DC | PRN
  Administered 2023-06-06 – 2023-06-08 (×6): 650 mg via ORAL
  Filled 2023-06-06 (×6): qty 2

## 2023-06-06 MED ORDER — LORATADINE 10 MG PO TABS
10.0000 mg | ORAL_TABLET | Freq: Every day | ORAL | Status: DC
  Administered 2023-06-06 – 2023-06-10 (×5): 10 mg via ORAL
  Filled 2023-06-06 (×5): qty 1

## 2023-06-06 MED ORDER — CHLORTHALIDONE 25 MG PO TABS
25.0000 mg | ORAL_TABLET | Freq: Every day | ORAL | Status: DC
  Administered 2023-06-06 – 2023-06-10 (×5): 25 mg via ORAL
  Filled 2023-06-06 (×5): qty 1

## 2023-06-06 MED ORDER — CALCIUM GLUCONATE-NACL 2-0.675 GM/100ML-% IV SOLN
2.0000 g | Freq: Once | INTRAVENOUS | Status: AC
  Administered 2023-06-06: 2000 mg via INTRAVENOUS
  Filled 2023-06-06: qty 100

## 2023-06-06 MED ORDER — IPRATROPIUM BROMIDE 0.02 % IN SOLN: 0.5000 mg | Freq: Four times a day (QID) | RESPIRATORY_TRACT | Status: DC | PRN

## 2023-06-06 MED ORDER — SODIUM CHLORIDE 0.9 % IV BOLUS
1000.0000 mL | Freq: Once | INTRAVENOUS | Status: AC
  Administered 2023-06-06: 1000 mL via INTRAVENOUS

## 2023-06-06 MED ORDER — SODIUM CHLORIDE 0.9 % IV SOLN: 2.0000 g | INTRAVENOUS | Status: DC

## 2023-06-06 MED ORDER — POTASSIUM CHLORIDE 10 MEQ/100ML IV SOLN
10.0000 meq | INTRAVENOUS | Status: AC
  Administered 2023-06-06 (×2): 10 meq via INTRAVENOUS
  Filled 2023-06-06 (×2): qty 100

## 2023-06-06 MED ORDER — SODIUM CHLORIDE 0.9 % IV SOLN
500.0000 mg | INTRAVENOUS | Status: DC
  Administered 2023-06-06: 500 mg via INTRAVENOUS
  Filled 2023-06-06: qty 5

## 2023-06-06 MED ORDER — MAGNESIUM OXIDE -MG SUPPLEMENT 400 (240 MG) MG PO TABS
400.0000 mg | ORAL_TABLET | Freq: Every evening | ORAL | Status: DC
  Administered 2023-06-06: 400 mg via ORAL
  Filled 2023-06-06: qty 1

## 2023-06-06 MED ORDER — IOHEXOL 350 MG/ML SOLN
75.0000 mL | Freq: Once | INTRAVENOUS | Status: AC | PRN
  Administered 2023-06-06: 75 mL via INTRAVENOUS

## 2023-06-06 MED ORDER — ASPIRIN 325 MG PO TABS: 325.0000 mg | ORAL_TABLET | Freq: Four times a day (QID) | ORAL | Status: DC | PRN

## 2023-06-06 MED ORDER — BISACODYL 5 MG PO TBEC: 5.0000 mg | DELAYED_RELEASE_TABLET | Freq: Every day | ORAL | Status: DC | PRN

## 2023-06-06 MED ORDER — HYDRALAZINE HCL 20 MG/ML IJ SOLN
5.0000 mg | Freq: Three times a day (TID) | INTRAMUSCULAR | Status: DC | PRN
  Administered 2023-06-06: 5 mg via INTRAVENOUS
  Filled 2023-06-06: qty 1

## 2023-06-06 MED ORDER — ACETAMINOPHEN 650 MG RE SUPP: 650.0000 mg | Freq: Four times a day (QID) | RECTAL | Status: DC | PRN

## 2023-06-06 MED ORDER — KETOROLAC TROMETHAMINE 15 MG/ML IJ SOLN
15.0000 mg | Freq: Once | INTRAMUSCULAR | Status: AC
  Administered 2023-06-06: 15 mg via INTRAVENOUS
  Filled 2023-06-06: qty 1

## 2023-06-06 MED ORDER — SODIUM CHLORIDE 0.9% FLUSH
3.0000 mL | Freq: Two times a day (BID) | INTRAVENOUS | Status: DC
  Administered 2023-06-06 – 2023-06-10 (×8): 3 mL via INTRAVENOUS

## 2023-06-06 MED ORDER — EZETIMIBE 10 MG PO TABS
10.0000 mg | ORAL_TABLET | Freq: Every day | ORAL | Status: DC
  Administered 2023-06-06 – 2023-06-10 (×5): 10 mg via ORAL
  Filled 2023-06-06 (×5): qty 1

## 2023-06-06 MED ORDER — HYDROXYZINE HCL 25 MG PO TABS: 25.0000 mg | ORAL_TABLET | Freq: Three times a day (TID) | ORAL | Status: DC | PRN

## 2023-06-06 MED ORDER — SODIUM CHLORIDE 0.9 % IV SOLN: INTRAVENOUS | Status: DC

## 2023-06-06 MED ORDER — ONDANSETRON HCL 4 MG PO TABS: 4.0000 mg | ORAL_TABLET | Freq: Four times a day (QID) | ORAL | Status: DC | PRN

## 2023-06-06 MED ORDER — ATORVASTATIN CALCIUM 10 MG PO TABS
20.0000 mg | ORAL_TABLET | Freq: Every day | ORAL | Status: DC
  Administered 2023-06-06 – 2023-06-10 (×5): 20 mg via ORAL
  Filled 2023-06-06 (×5): qty 2

## 2023-06-06 MED ORDER — CALCIUM GLUCONATE-NACL 1-0.675 GM/50ML-% IV SOLN
1.0000 g | Freq: Once | INTRAVENOUS | Status: AC
  Administered 2023-06-06: 1000 mg via INTRAVENOUS
  Filled 2023-06-06: qty 50

## 2023-06-06 MED ORDER — INSULIN ASPART 100 UNIT/ML IJ SOLN: 0.0000 [IU] | Freq: Every day | INTRAMUSCULAR | Status: DC

## 2023-06-06 MED ORDER — LEVALBUTEROL HCL 0.63 MG/3ML IN NEBU: 0.6300 mg | INHALATION_SOLUTION | RESPIRATORY_TRACT | Status: DC | PRN

## 2023-06-06 MED ORDER — ONDANSETRON HCL 4 MG/2ML IJ SOLN: 4.0000 mg | Freq: Four times a day (QID) | INTRAMUSCULAR | Status: DC | PRN

## 2023-06-06 NOTE — Significant Event (Signed)
Rapid Response Event Note   Reason for Call :  Increase oxygen demand. Per RN, SpO2-88% on 15L HFNC  Initial Focused Assessment:  Pt lying in bed with eyes closed. She awakens easily to voice, follows commands, moves all extremities, answers questions appropriately. When not stimulated, she is resting.  Her breathing tachypneic. Lungs diminished t/o. ABD large, distended, soft. Skin hot to touch.  HR-121, BP-123/73, RR-34, SpO2-91% on 15L HFNC(salter)(this sat is from finger probe over fake nails-when placed on ear SpO2-95-97%)   Interventions:  ABG EKG-ST CTA chest r/o PE-No evidence of pulmonary embolus.Coronary artery disease.Bilateral airspace disease, right greater than left concerning for pneumonia. Aortic Atherosclerosis PCCM consulted: Tx to ICU, CPAP Plan of Care:  Pt tx to 3M11.   Event Summary:   MD Notified: Dr. Janalyn Shy notified by bedside RN, PCCM consulted. Dr Judeth Horn to bedside Call Time:1912 Arrival Time:1918 End Time:2005  Terrilyn Saver, RN

## 2023-06-06 NOTE — Progress Notes (Signed)
DO, Alexis Hugelmeyer made aware of patient being tachycardic rate 130's, BP elevated 165/108, and oxygen saturation of 73% room air. Patient Placed on 6L  and recovered to 87%, with further consultation with DO, I placed patient on HFNC 15L patient oxygen saturation recovered to 92%. Patient Alert but drowsy, family at the bedside. Rapid Response nurse made aware of patients events. Awaiting further orders at this time.

## 2023-06-06 NOTE — Progress Notes (Signed)
  Patient's oxygen requirement gradually trending up.  Currently on 15 L high flow oxygen O2 sat variable to upper 92%.  Elevated D-dimer.  Concern for development of PE.  Ordering stat CTA.  Consulted PCCM for evaluation.  -Transferring patient to stepdown unit as oxygen requirement going up.

## 2023-06-06 NOTE — Progress Notes (Signed)
Pharmacy Antibiotic Note  Shelby Robertson is a 52 y.o. female admitted on 06/05/2023 with sepsis from pyelonephritis.  Patient has worsening hypoxia with possible PNA.  Pharmacy has been consulted for cefepime dosing.  Patient is also on azithromycin.  Noted history of Enterobacter and E.coli UTI.  SCr 0.7 > 1.19, Tmax 101.4, WBC 18.1, lactate WNL.  Plan: Cefepime 2gm IV Q8H Monitor renal fxn, clinical progress  Height: 5\' 6"  (167.6 cm) Weight: 134.3 kg (296 lb) IBW/kg (Calculated) : 59.3  Temp (24hrs), Avg:99.6 F (37.6 C), Min:98 F (36.7 C), Max:101.4 F (38.6 C)  Recent Labs  Lab 06/06/23 0306 06/06/23 0550 06/06/23 0905 06/06/23 1102 06/06/23 1306  WBC 18.1*  --   --   --   --   CREATININE  --  0.70 1.19*  --   --   LATICACIDVEN  --   --  1.6 1.1 1.2    Estimated Creatinine Clearance: 79.7 mL/min (A) (by C-G formula based on SCr of 1.19 mg/dL (H)).    No Known Allergies   Cefepime 8/19 >> Rocephin  x1 8/19 Azith 8/19 >>  8/18 UCx -  8/19 BCx -  8/19 resp panel PCR - negative  Ralene Gasparyan D. Laney Potash, PharmD, BCPS, BCCCP 06/06/2023, 8:34 PM

## 2023-06-06 NOTE — ED Provider Notes (Signed)
[facility]-EMERGENCY DEPT [facility] Emergency Department Provider Note MRN:  [id number]  Arrival date & time: 06/06/23     Chief Complaint   Flank Pain   History of Present Illness   Shelby Robertson is a 52 y.o. year-old female with a history of diabetes is presenting to the ED with chief complaint of flank pain.  Bilateral flank pain chills and fever, burning with urination.  Flank pain started suddenly.  Review of Systems  A thorough review of systems was obtained and all systems are negative except as noted in the HPI and PMH.   Patient's Health History    Past Medical History:  Diagnosis Date   AMS (altered mental status) 09/17/2019   Diabetes mellitus without complication (HCC)    Eczema    Fibromyalgia    Fibromyalgia    Hypertension    Osteoarthritis    Rheumatoid arthritis(714.0)    Rheumatoid arthritis(714.0)    Sepsis due to Escherichia coli (E. coli) (HCC) 11/28/2021    History reviewed. No pertinent surgical history.  Family History  Problem Relation Age of Onset   Diabetes Mother    Healthy Father    Heart attack Maternal Grandmother    Stroke Maternal Grandmother     Social History   Socioeconomic History   Marital status: Single    Spouse name: Not on file   Number of children: Not on file   Years of education: Not on file   Highest education level: Not on file  Occupational History   Occupation: disability  Tobacco Use   Smoking status: Every Day    Current packs/day: 0.50    Average packs/day: 0.5 packs/day for 20.0 years (10.0 ttl pk-yrs)    Types: Cigarettes    Passive exposure: Never   Smokeless tobacco: Never   Tobacco comments:    0.5 - 1 ppd depending on how she feels  Vaping Use   Vaping status: Never Used  Substance and Sexual Activity   Alcohol use: Not Currently   Drug use: Not Currently    Types: Hydrocodone   Sexual activity: Yes    Birth control/protection: Condom  Other Topics Concern   Not on file  Social History  Narrative   Not on file   Social Determinants of Health   Financial Resource Strain: High Risk (06/07/2022)   Overall Financial Resource Strain (CARDIA)    Difficulty of Paying Living Expenses: Very hard  Food Insecurity: No Food Insecurity (07/13/2022)   Hunger Vital Sign    Worried About Running Out of Food in the Last Year: Never true    Ran Out of Food in the Last Year: Never true  Transportation Needs: No Transportation Needs (07/13/2022)   PRAPARE - Administrator, Civil Service (Medical): No    Lack of Transportation (Non-Medical): No  Physical Activity: Insufficiently Active (06/07/2022)   Exercise Vital Sign    Days of Exercise per Week: 3 days    Minutes of Exercise per Session: 40 min  Stress: No Stress Concern Present (06/07/2022)   [employer] of Occupational Health - Occupational Stress Questionnaire    Feeling of Stress : Not at all  Social Connections: Moderately Isolated (06/07/2022)   Social Connection and Isolation Panel [NHANES]    Frequency of Communication with Friends and Family: More than three times a week    Frequency of Social Gatherings with Friends and Family: More than three times a week    Attends Religious Services: Never    Active  Member of Clubs or Organizations: Yes    Attends Banker Meetings: Never    Marital Status: Never married  Intimate Partner Violence: Not At Risk (06/07/2022)   Humiliation, Afraid, Rape, and Kick questionnaire    Fear of Current or Ex-Partner: No    Emotionally Abused: No    Physically Abused: No    Sexually Abused: No     Physical Exam   Vitals:   06/05/23 2029 06/06/23 0051  BP: 125/76 (!) 174/98  Pulse: (!) 122 (!) 116  Resp: 16 15  Temp: 98.4 F (36.9 C) 98.3 F (36.8 C)  SpO2: 96% 96%    CONSTITUTIONAL: Well-appearing, NAD NEURO/PSYCH:  Alert and oriented x 3, no focal deficits EYES:  eyes equal and reactive ENT/NECK:  no LAD, no JVD CARDIO: Tachycardic rate, well-perfused,  normal S1 and S2 PULM:  CTAB no wheezing or rhonchi GI/GU:  non-distended, non-tender MSK/SPINE:  No gross deformities, no edema SKIN:  no rash, atraumatic   *Additional and/or pertinent findings included in MDM below  Diagnostic and Interventional Summary    EKG Interpretation Date/Time:    Ventricular Rate:    PR Interval:    QRS Duration:    QT Interval:    QTC Calculation:   R Axis:      Text Interpretation:         Labs Reviewed  URINALYSIS, W/ REFLEX TO CULTURE (INFECTION SUSPECTED) - Abnormal; Notable for the following components:      Result Value   Color, Urine RED (*)    APPearance TURBID (*)    Glucose, UA   (*)    Value: TEST NOT REPORTED DUE TO COLOR INTERFERENCE OF URINE PIGMENT   Hgb urine dipstick   (*)    Value: TEST NOT REPORTED DUE TO COLOR INTERFERENCE OF URINE PIGMENT   Bilirubin Urine   (*)    Value: TEST NOT REPORTED DUE TO COLOR INTERFERENCE OF URINE PIGMENT   Ketones, ur   (*)    Value: TEST NOT REPORTED DUE TO COLOR INTERFERENCE OF URINE PIGMENT   Protein, ur   (*)    Value: TEST NOT REPORTED DUE TO COLOR INTERFERENCE OF URINE PIGMENT   Nitrite   (*)    Value: TEST NOT REPORTED DUE TO COLOR INTERFERENCE OF URINE PIGMENT   Leukocytes,Ua   (*)    Value: TEST NOT REPORTED DUE TO COLOR INTERFERENCE OF URINE PIGMENT   Bacteria, UA MANY (*)    All other components within normal limits  CBC - Abnormal; Notable for the following components:   WBC 18.1 (*)    RBC 5.13 (*)    Hemoglobin 15.1 (*)    All other components within normal limits  I-STAT CHEM 8, ED - Abnormal; Notable for the following components:   Chloride 115 (*)    Calcium, Ion 0.66 (*)    TCO2 17 (*)    Hemoglobin 10.2 (*)    HCT 30.0 (*)    All other components within normal limits  URINE CULTURE  CULTURE, BLOOD (ROUTINE X 2)  CULTURE, BLOOD (ROUTINE X 2)  PREGNANCY, URINE  COMPREHENSIVE METABOLIC PANEL  LIPASE, BLOOD  LACTIC ACID, PLASMA  LACTIC ACID, PLASMA    CT  ABDOMEN PELVIS W CONTRAST  Final Result      Medications  sodium chloride 0.9 % bolus 1,000 mL (has no administration in time range)  sodium chloride 0.9 % bolus 1,000 mL (0 mLs Intravenous Stopped 06/06/23 0641)  ketorolac (TORADOL)  15 MG/ML injection 15 mg (15 mg Intravenous Given 06/06/23 0307)  morphine (PF) 4 MG/ML injection 4 mg (4 mg Intravenous Given 06/06/23 0307)  cefTRIAXone (ROCEPHIN) 2 g in sodium chloride 0.9 % 100 mL IVPB (0 g Intravenous Stopped 06/06/23 0641)  iohexol (OMNIPAQUE) 350 MG/ML injection 75 mL (75 mLs Intravenous Contrast Given 06/06/23 4098)     Procedures  /  Critical Care .Critical Care  Performed by: Sabas Sous, MD Authorized by: Sabas Sous, MD   Critical care provider statement:    Critical care time (minutes):  35   Critical care was necessary to treat or prevent imminent or life-threatening deterioration of the following conditions:  Sepsis   Critical care was time spent personally by me on the following activities:  Development of treatment plan with patient or surrogate, discussions with consultants, evaluation of patient's response to treatment, examination of patient, ordering and review of laboratory studies, ordering and review of radiographic studies, ordering and performing treatments and interventions, pulse oximetry, re-evaluation of patient's condition and review of old charts   ED Course and Medical Decision Making  Initial Impression and Ddx Patient has a history of sepsis due to pyelonephritis requiring admission.  This seems like a similar presentation.  The pain was fairly sudden onset and so also considering a concomitant kidney stone.  Tachycardic but no fever at this time, reassuring blood pressure.  Will monitor closely.  Past medical/surgical history that increases complexity of ED encounter: Diabetes  Interpretation of Diagnostics I personally reviewed the laboratory assessment and my interpretation is as follows: Prominent  leukocytosis, bacteria in urine  CT showing evidence of left-sided pyelonephritis  Patient Reassessment and Ultimate Disposition/Management     On reassessment patient continues to be tachycardic, still feels cold and unwell. Sepsis considered.  Will admit to medicine.  Has received ceftriaxone, IV fluids.  Patient management required discussion with the following services or consulting groups:  Hospitalist Service  Complexity of Problems Addressed Acute illness or injury that poses threat of life of bodily function  Additional Data Reviewed and Analyzed Further history obtained from: Further history from spouse/family member  Additional Factors Impacting ED Encounter Risk Consideration of hospitalization  Elmer Sow. Pilar Plate, MD [facility] Emergency Medicine [facility] [email or web address]  Final Clinical Impressions(s) / ED Diagnoses     ICD-10-CM   1. Pyelonephritis  N12     2. Sepsis, due to unspecified organism, unspecified whether acute organ dysfunction present [facility])  A41.9       ED Discharge Orders     None        Discharge Instructions Discussed with and Provided to Patient:   Discharge Instructions   None      Sabas Sous, MD 06/06/23 [phone]

## 2023-06-06 NOTE — TOC CM/SW Note (Signed)
Transition of Care [facility]) - Inpatient Brief Assessment   Patient Details  Name: Shelby Robertson MRN: [id number] Date of Birth: Jul 19, 1971  Transition of Care [facility]) CM/SW Contact:    Gordy Clement, RN Phone Number: 06/06/2023, 3:37 PM   Clinical Narrative: Patient from home . Patient is insured and does have a PCP. Smoking cessation and financial resource information attached to discharge information  TOC will continue to follow patient for any additional discharge needs      Transition of Care Asessment: Insurance and Status: Insurance coverage has been reviewed Administrator Medicare HMO PPO) Patient has primary care physician: Yes Christell Constant, Lolita Cram, FNP) Home environment has been reviewed: From home Prior level of function:: independent Prior/Current Home Services: No current home services Social Determinants of Health Reivew: SDOH reviewed interventions complete Readmission risk has been reviewed: Yes (14%) Transition of care needs: transition of care needs identified, TOC will continue to follow

## 2023-06-06 NOTE — ED Notes (Signed)
Pt had an episode of incontinence wile nurse was at bedside.

## 2023-06-06 NOTE — Significant Event (Addendum)
Rapid Response Event Note   Reason for Call :  Red MEWS- tachycardia  Initial Focused Assessment:   On arrival, pt laying in bed A&O, c/o back pain. Skin warm to touch. Pt just arrived from ED, here with pyelonephritis. Per RN, spO2 dropped to 86% on RA. She was placed on 2L [place] and spO2 improved. Lungs clear/diminished with no cough noted.   Vitals: Temp 100.9, HR 145, BP 166/93 (111), RR 30, spO2 95% on 2L [place].   Dr Emmit Pomfret to bedside.    Interventions:  1L [place] EKG CXR Admission orders released and started by [facility] staff.   Plan of Care:  RN to give pain medicine and tylenol. Will transfer to med surg/tele unit when available. RN instructed to call with any changes or concerns.    Event Summary:   MD Notified: Dr Emmit Pomfret notified PTA  Call Time: 0918 Arrival Time: 0922 End Time: 0950  Mordecai Rasmussen, RN

## 2023-06-06 NOTE — Plan of Care (Signed)
Report called to [place], RN over at [employer]

## 2023-06-06 NOTE — Progress Notes (Signed)
   06/06/23 1059  Spiritual Encounters  Type of Visit Initial  Care provided to: Pt and family  Reason for visit Urgent spiritual support  OnCall Visit No  Spiritual Framework  Presenting Themes Coping tools;Caregiving needs;Other (comment);Community and relationships  Interventions  Spiritual Care Interventions Made Compassionate presence;Reflective listening;Encouragement;Prayer  Intervention Outcomes  Outcomes Patient family open to resources;Connection to spiritual care   Responded to rapid response page for patient in distress. Arrived at the room to find patient's mother, Meryl Dare, witnessing medical teams providing emergency care to patient. Mother appeared stressed. Chaplain provided spiritual care to mother while medical team administered care to the patient. Patient and mother had been in ED since last night around 9pm, patient had been experiencing signs of distress since earlier that morning. Mother believes the symptoms came on rapidly as they had a dinner night out the evening before and patient was fine.      Patient's son expected to arrive soon, at which time the mother will leave temporarily to acquire lunch. Doctor states due to the patient's condition, the patient will be moved to another department so that she can be monitored differently. Chaplain will monitor this case and revisit patient and family once patient has been moved and settled in.

## 2023-06-06 NOTE — Progress Notes (Signed)
eLink Physician-Brief Progress Note Patient Name: Shelby Robertson DOB: 02/07/1971 MRN: [id number]   Date of Service  06/06/2023  HPI/Events of Note  52 year old female with a history of prediabetes essential hypertension and dyslipidemia was initially admitted to the intensive care unit for severe sepsis secondary to pyelonephritis complicated by hypoxic respiratory failure.  Patient presented with tachycardia, tachypnea, and fevers.  Urinalysis is grossly positive.  Metabolic panel with mild electrolyte disturbances, elevated renal markers.  Leukocytosis to 18.  Blood cultures been collected.  CT abdomen consistent with pyelonephritis and CT PE study without evidence of PE but multifocal airspace disease is present.  eICU Interventions  Maintain broad-spectrum antibiotics as ordered.  Monitor renal function, maintain hydration  Agree with continuing CPAP.  Heparin subcutaneous for DVT prophylaxis, GI prophylaxis not indicated.   4098- had a run of SVT, persistent tachycardia. Metoprolol at home. Persistent fever despite tylenol. Apply cooling blanket, hold additional meds for now.    Intervention Category Evaluation Type: New Patient Evaluation  Shelby Robertson 06/06/2023, 9:12 PM

## 2023-06-06 NOTE — Progress Notes (Signed)
IV obtained by ED RN.

## 2023-06-06 NOTE — Consult Note (Signed)
NAME:  Shelby Robertson, MRN:  [id number], DOB:  [birth year], LOS: 0 ADMISSION DATE:  06/05/2023, CONSULTATION DATE:  06/06/23  REFERRING MD:  TRH, CHIEF COMPLAINT:  hypoxemia   History of Present Illness:  52 year old woman admitted with severe sepsis due to polynephritis with worsening hypoxemia prompting ICU transfer.  Presented to ED febrile.  Noted to have some abdominal pain.  CT abdomen pelvis notable for pyelonephritis.  Scattered nonobstructing stones no hydronephrosis etc.  Admitted to the floor.  Started on antibiotics.  Worsening hypoxemia throughout the day.  Initial chest x-ray on my review demonstrates chronically elevated right hemidiaphragm with what appears to be interstitial markings concerning for possible atypical infection.  Respiratory viral panel is negative.  Went for CTA PE protocol.  Read pending I do not see any obvious PE.  Scattered atelectatic changes as well as linear like infiltrates, edema versus infection.  On 15 L nasal cannula slightly tachypneic with no accessory muscle use.  Relatively comfortable.  Notably O2 saturation not really improving despite escalation from room air to 2 L up to 15 L salter.  Argues for some shunt physiology as below.  Transferred to ICU for NIPPV.  Pertinent  Medical History  Prediabetes, hypertension, hyperlipidemia  Significant Hospital Events: Including procedures, antibiotic start and stop dates in addition to other pertinent events   8/19 admitted with fever and pyelonephritis  Interim History / Subjective:  Worsening hypoxemia after admission leading to ICU transfer  Objective   Blood pressure 139/80, pulse (!) 121, temperature 98 F (36.7 C), temperature source Oral, resp. rate (!) 30, height 5\' 6"  (1.676 m), weight 134.3 kg, SpO2 92%.        Intake/Output Summary (Last 24 hours) at 06/06/2023 2018 Last data filed at 06/06/2023 2011 Gross per 24 hour  Intake --  Output 1050 ml  Net -1050 ml   Filed Weights    06/05/23 2050  Weight: 134.3 kg    Examination: General: Lying in bed, in no acute distress HENT: EOMI, no icterus, moist mucous membranes Lungs: Poor air movement on the right, no work of breathing, no accessory muscle use, mild tachypnea Cardiovascular: Tachycardic, currently fever and, regular rhythm Abdomen: Nontender, bowel sounds present Neuro: Alert, oriented, no focal deficits   Resolved Hospital Problem list   N/a  Assessment & Plan:   Acute hypoxemic respiratory failure: With what appears to be right hemidiaphragm elevation/paralysis which is chronic.  Suspect shunt physiology in the setting of shallow breathing.  CTA PE protocol with scattered infiltrates, atelectasis versus edema versus atypical infection.  Has gone from room air to 15 L without really improvement argues for shunt physiology as well. -- Respiratory viral panel is negative -- Continue azithromycin, escalate ceftriaxone to cefepime -- Transfer to ICU, start NIPPV, CPAP for now, has been on CPAP in the past, wean as tolerated -- Consider heated high flow when not using CPAP --Stop maintenance IV fluids -- Follow-up CTA read, no obvious large PE on my review of images  Severe sepsis due to pyelonephritis: With possible contribution of me acquired pneumonia.  Now transferred to the ICU.  Past culture data reviewed with E. coli, most recently some resistance to cefuroxime otherwise pansensitive.  Endorgan damage of AKI as well as hypoxemia. -- Cefepime as above -- Follow blood cultures, urine culture  Elevated bilirubin: Sludge on ultrasound.  Nontender.  No concern for cholecystitis. -- Continue to monitor  Acute kidney injury: Status post maintenance fluids for the last 8 to 10 hours.  Now discontinued. -- Continue to monitor  Hyperglycemia: Previous A1c 2023 prediabetic. -- Sliding scale insulin  History of hypertension: -- Continue amlodipine, chlorthalidone, Lopressor  Hyperlipidemia: -- Continue  statin  Best Practice (right click and "Reselect all SmartList Selections" daily)   Diet/type: NPO w/ oral meds DVT prophylaxis: systemic heparin GI prophylaxis: N/A Lines: N/A Foley:  N/A Code Status:  full code Last date of multidisciplinary goals of care discussion [updated patient and family at bedside]  Labs   CBC: Recent Labs  Lab 06/06/23 0306 06/06/23 0550  WBC 18.1*  --   HGB 15.1* 10.2*  HCT 45.9 30.0*  MCV 89.5  --   PLT 268  --     Basic Metabolic Panel: Recent Labs  Lab 06/06/23 0550 06/06/23 0905 06/06/23 1102  NA 140 135  --   K 4.2 3.4*  --   CL 115* 102  --   CO2  --  21*  --   GLUCOSE 90 167*  --   BUN 11 10  --   CREATININE 0.70 1.19*  --   CALCIUM  --  8.3*  --   MG  --   --  1.5*  PHOS  --   --  2.6   GFR: Estimated Creatinine Clearance: 79.7 mL/min (A) (by C-G formula based on SCr of 1.19 mg/dL (H)). Recent Labs  Lab 06/06/23 0306 06/06/23 0905 06/06/23 1102 06/06/23 1306  WBC 18.1*  --   --   --   LATICACIDVEN  --  1.6 1.1 1.2    Liver Function Tests: Recent Labs  Lab 06/06/23 0905  AST 15  ALT 17  ALKPHOS 62  BILITOT 1.7*  PROT 7.4  ALBUMIN 3.1*   Recent Labs  Lab 06/06/23 0905  LIPASE 20   No results for input(s): "AMMONIA" in the last 168 hours.  ABG    Component Value Date/Time   HCO3 29.8 (H) 09/16/2019 1903   TCO2 17 (L) 06/06/2023 0550   O2SAT 94.0 09/16/2019 1903     Coagulation Profile: Recent Labs  Lab 06/06/23 1102  INR 1.3*    Cardiac Enzymes: No results for input(s): "CKTOTAL", "CKMB", "CKMBINDEX", "TROPONINI" in the last 168 hours.  HbA1C: Hemoglobin A1C  Date/Time Value Ref Range Status  02/07/2020 12:00 AM 6.6  Final   Hgb A1c MFr Bld  Date/Time Value Ref Range Status  01/25/2023 09:57 AM 5.8 (H) 4.8 - 5.6 % Final    Comment:             Prediabetes: 5.7 - 6.4          Diabetes: >6.4          Glycemic control for adults with diabetes: <7.0   06/07/2022 02:41 PM 5.8 (H) 4.8 -  5.6 % Final    Comment:             Prediabetes: 5.7 - 6.4          Diabetes: >6.4          Glycemic control for adults with diabetes: <7.0     CBG: Recent Labs  Lab 06/06/23 0856 06/06/23 1248 06/06/23 1737  GLUCAP 185* 174* 151*    Review of Systems:   No chest pain.  No orthopnea or PND.  Comprehensive review of systems otherwise negative.  Past Medical History:  She,  has a past medical history of AMS (altered mental status) (09/17/2019), Diabetes mellitus without complication (HCC), Eczema, Fibromyalgia, Fibromyalgia, Hypertension, Osteoarthritis, Rheumatoid arthritis(714.0), Rheumatoid arthritis(714.0),  and Sepsis due to Escherichia coli (E. coli) (HCC) (11/28/2021).   Surgical History:  History reviewed. No pertinent surgical history.   Social History:   reports that she has been smoking cigarettes. She has a 10 pack-year smoking history. She has never been exposed to tobacco smoke. She has never used smokeless tobacco. She reports that she does not currently use alcohol. She reports that she does not currently use drugs after having used the following drugs: Hydrocodone.   Family History:  Her family history includes Diabetes in her mother; Healthy in her father; Heart attack in her maternal grandmother; Stroke in her maternal grandmother.   Allergies No Known Allergies   Home Medications  Prior to Admission medications   Medication Sig Start Date End Date Taking? Authorizing Provider  Adalimumab (HUMIRA PEN) 40 MG/0.8ML PNKT INJECT 1 PEN INTO THE SKIN EVERY 14 DAYS Patient taking differently: Inject 40 mg into the skin every 14 (fourteen) days. 11/11/21 06/06/23 Yes Deveshwar, Janalyn Rouse, MD  methotrexate 50 MG/2ML injection Inject 0.8 mls (20 mg total) into the skin weekly. 07/28/21  Yes Gearldine Bienenstock, PA-C  acetaminophen (TYLENOL) 325 MG tablet Take 2 tablets (650 mg total) by mouth every 6 (six) hours as needed for mild pain (or Fever >/= 101). 11/30/21   Elgergawy,  Leana Roe, MD  albuterol (PROVENTIL) (2.5 MG/3ML) 0.083% nebulizer solution Take 3 mLs (2.5 mg total) by nebulization every 6 (six) hours as needed for wheezing or shortness of breath. 07/13/21   Arnette Felts, FNP  albuterol (VENTOLIN HFA) 108 (90 Base) MCG/ACT inhaler Inhale 2 puffs into the lungs every 4 (four) hours as needed for wheezing or shortness of breath (cough, shortness of breath or wheezing.). 10/11/19   Elvina Sidle, MD  amLODipine (NORVASC) 10 MG tablet Take 1 tablet (10 mg total) by mouth daily. 06/07/22   Arnette Felts, FNP  aspirin 325 MG tablet Take 325 mg by mouth every 6 (six) hours as needed (pain).    [provider]  atorvastatin (LIPITOR) 20 MG tablet TAKE 1 TABLET BY MOUTH EVERY DAY 11/23/22   Arnette Felts, FNP  B-D TB SYRINGE 1CC/27GX1/2" 27G X 1/2" 1 ML MISC USE 1 SYRINGE TO INJECT METHOTREXATE INTO THE SKIN ONCE A WEEK. 07/23/19   Pollyann Savoy, MD  blood glucose meter kit and supplies KIT Dispense based on patient and insurance preference. Use up to two times daily as directed. (FOR ICD-9 250.00, 250.01). 01/17/20   Arnette Felts, FNP  cetirizine (ZYRTEC ALLERGY) 10 MG tablet Take 1 tablet (10 mg total) by mouth daily. 03/13/22   Ellsworth Lennox, PA-C  chlorthalidone (HYGROTON) 25 MG tablet Take 1 tablet (25 mg total) by mouth daily. 01/25/23   Arnette Felts, FNP  Clobetasol Propionate 0.05 % shampoo Apply onto dry scalp once daily, leave on 10 15 minutes and then rinse and shampoo off. 03/13/22   Ellsworth Lennox, PA-C  diphenhydrAMINE (BENADRYL) 25 MG tablet Take 25 mg by mouth every 6 (six) hours as needed for itching, allergies or sleep.    [provider]  doxycycline (VIBRAMYCIN) 100 MG capsule Take 100 mg by mouth 2 (two) times daily. 01/14/23   [provider]  DULoxetine (CYMBALTA) 60 MG capsule Take 60 mg by mouth daily.    [provider]  ezetimibe (ZETIA) 10 MG tablet TAKE 1 TABLET BY MOUTH EVERY DAY Patient taking differently: Take  10 mg by mouth daily. 03/27/21   Arnette Felts, FNP  HYDROcodone-acetaminophen (NORCO) 7.5-325 MG tablet Take 1  tablet by mouth every 6 (six) hours as needed for moderate pain. 01/02/23   [provider]  hydrocortisone cream 1 % Apply to affected area 2 times daily 03/13/22   Ellsworth Lennox, PA-C  hydrOXYzine (ATARAX/VISTARIL) 25 MG tablet Take 1 tablet (25 mg total) by mouth every 8 (eight) hours as needed for itching. 05/15/20   Lamptey, Britta Mccreedy, MD  Lancets [facility] DELICA PLUS [place]) MISC 2 (two) times daily. use for testing 01/17/20   [provider]  Magnesium 250 MG TABS Take 1 tablet (250 mg total) by mouth every evening. With meal 01/25/23   Arnette Felts, FNP  metFORMIN (GLUCOPHAGE) 500 MG tablet TAKE 1 TABLET BY MOUTH 2 TIMES DAILY WITH A MEAL. 11/23/22   Arnette Felts, FNP  metoprolol tartrate (LOPRESSOR) 50 MG tablet TAKE 1 TABLET (50 MG TOTAL) BY MOUTH DAILY. PLEASE SCHEDULE APPOINTMENT FOR REFILLS. 11/23/22   Arnette Felts, FNP  [facility] VERIO test strip TEST TWICE DAILY 02/11/20   Arnette Felts, FNP  tiZANidine (ZANAFLEX) 4 MG tablet Take 4 mg by mouth every 6 (six) hours. 12/30/22   [provider]  triamcinolone ointment (KENALOG) 0.1 % Apply 1 application topically daily as needed (rash, itching). 09/30/20   [provider]  Vitamin D, Ergocalciferol, (DRISDOL) 1.25 MG (50000 UNIT) CAPS capsule TAKE 1 CAPSULE BY MOUTH 2 (TWO) TIMES A WEEK. TAKE 1 CAPSULE BY MOUTH ON TUES AND ONE ON FRIDAYS 11/23/22   Arnette Felts, FNP     Critical care time:     CRITICAL CARE Performed by: Karren Burly   Total critical care time: 40 minutes  Critical care time was exclusive of separately billable procedures and treating other patients.  Critical care was necessary to treat or prevent imminent or life-threatening deterioration.  Critical care was time spent personally by me on the following activities: development of treatment plan with patient and/or  surrogate as well as nursing, discussions with consultants, evaluation of patient's response to treatment, examination of patient, obtaining history from patient or surrogate, ordering and performing treatments and interventions, ordering and review of laboratory studies, ordering and review of radiographic studies, pulse oximetry and re-evaluation of patient's condition.  Karren Burly, MD See Loretha Stapler

## 2023-06-06 NOTE — Sepsis Progress Note (Signed)
Elink following code sepsis °

## 2023-06-06 NOTE — Progress Notes (Signed)
At shift change, Pts HR 122-125b/m , RR 30-40, sats 91% @15LHFNC , DR Sundil notified and placed order for CTA chest and EKG. Rapid and charge nurse notified and transported pt to CT. Upon pt arrival back to unit, PCCM at bedside to assess to pt and order placed to transfer to ICU. Report called to 43M.

## 2023-06-06 NOTE — H&P (Signed)
History and Physical   [facility] - [place] @ [facility] Admission History and Physical [employer], D.O.    Patient Name: Shelby Robertson MR#: [id number] Date of Birth: Apr 13, 1971 Date of Admission: 06/05/2023  Referring MD/NP/PA: Dr. Pilar Plate  Primary Care Physician: Arnette Felts, FNP  Chief Complaint:  Chief Complaint  Patient presents with   Flank Pain  Please note the entire history is obtained from the patient's emergency department chart, emergency department provider and the patient's mother who is at the bedside. Patient's personal history is limited by pain, poor historian.   HPI: Shelby Robertson is a 52 y.o. female with a known history of DM, fibromyalgia, HTN, OA, RA presents to the emergency department for evaluation of flank pain.  Patient was in a usual state of health until 2 days ago when she developed lower left sided abdominal pain radiating around to her flank associated with hematuria.  At this time complains only of left lower quadrant abdominal pain.  She denies fevers, dysuria, urinary frequency.  Upon arrival to the floor patient experienced shortness of breath associate with hypoxia at 86% on room air and tachycardia to 147.  Patient denies fevers/chills, weakness, dizziness, chest pain, shortness of breath, N/V/C/D, dysuria/frequency, changes in mental status.    Otherwise there has been no change in status. Patient has been taking medication as prescribed and there has been no recent change in medication or diet.  No recent antibiotics.  There has been no recent illness, hospitalizations, travel or sick contacts.    EMS/ED Course: Patient received ketorolac, morphine, Rocephin, NS x 2L. Medical admission has been requested for further management of sepsis 2/2 pyelo.  Review of Systems:  CONSTITUTIONAL: No fever/chills, fatigue, weakness, weight gain/loss, headache. EYES: No blurry or double vision. ENT: No tinnitus, postnasal drip, redness or  soreness of the oropharynx. RESPIRATORY: No cough, dyspnea, wheeze.  No hemoptysis.  CARDIOVASCULAR: No chest pain, palpitations, syncope, orthopnea. No lower extremity edema.  GASTROINTESTINAL: Positive mental pain no nausea, vomiting, abdominal pain, diarrhea, constipation.  No hematemesis, melena or hematochezia. GENITOURINARY: Positive hematuria no dysuria, frequency, ENDOCRINE: No polyuria or nocturia. No heat or cold intolerance. HEMATOLOGY: No anemia, bruising, bleeding. INTEGUMENTARY: No rashes, ulcers, lesions. MUSCULOSKELETAL: No arthritis, gout. NEUROLOGIC: No numbness, tingling, ataxia, seizure-type activity, weakness. PSYCHIATRIC: No anxiety, depression, insomnia.   Past Medical History:  Diagnosis Date   AMS (altered mental status) 09/17/2019   Diabetes mellitus without complication (HCC)    Eczema    Fibromyalgia    Fibromyalgia    Hypertension    Osteoarthritis    Rheumatoid arthritis(714.0)    Rheumatoid arthritis(714.0)    Sepsis due to Escherichia coli (E. coli) (HCC) 11/28/2021    History reviewed. No pertinent surgical history.   reports that she has been smoking cigarettes. She has a 10 pack-year smoking history. She has never been exposed to tobacco smoke. She has never used smokeless tobacco. She reports that she does not currently use alcohol. She reports that she does not currently use drugs after having used the following drugs: Hydrocodone.  No Known Allergies  Family History  Problem Relation Age of Onset   Diabetes Mother    Healthy Father    Heart attack Maternal Grandmother    Stroke Maternal Grandmother     Prior to Admission medications   Medication Sig Start Date End Date Taking? Authorizing Provider  acetaminophen (TYLENOL) 325 MG tablet Take 2 tablets (650 mg total) by mouth every 6 (six) hours as needed for  mild pain (or Fever >/= 101). 11/30/21   Elgergawy, Leana Roe, MD  Adalimumab (HUMIRA PEN) 40 MG/0.8ML PNKT INJECT 1 PEN INTO THE  SKIN EVERY 14 DAYS Patient taking differently: Inject 40 mg into the skin every 14 (fourteen) days. 11/11/21 11/11/22  Pollyann Savoy, MD  albuterol (PROVENTIL) (2.5 MG/3ML) 0.083% nebulizer solution Take 3 mLs (2.5 mg total) by nebulization every 6 (six) hours as needed for wheezing or shortness of breath. 07/13/21   Arnette Felts, FNP  albuterol (VENTOLIN HFA) 108 (90 Base) MCG/ACT inhaler Inhale 2 puffs into the lungs every 4 (four) hours as needed for wheezing or shortness of breath (cough, shortness of breath or wheezing.). 10/11/19   Elvina Sidle, MD  amLODipine (NORVASC) 10 MG tablet Take 1 tablet (10 mg total) by mouth daily. 06/07/22   Arnette Felts, FNP  aspirin 325 MG tablet Take 325 mg by mouth every 6 (six) hours as needed (pain).    [provider]  atorvastatin (LIPITOR) 20 MG tablet TAKE 1 TABLET BY MOUTH EVERY DAY 11/23/22   Arnette Felts, FNP  B-D TB SYRINGE 1CC/27GX1/2" 27G X 1/2" 1 ML MISC USE 1 SYRINGE TO INJECT METHOTREXATE INTO THE SKIN ONCE A WEEK. 07/23/19   Pollyann Savoy, MD  blood glucose meter kit and supplies KIT Dispense based on patient and insurance preference. Use up to two times daily as directed. (FOR ICD-9 250.00, 250.01). 01/17/20   Arnette Felts, FNP  cetirizine (ZYRTEC ALLERGY) 10 MG tablet Take 1 tablet (10 mg total) by mouth daily. 03/13/22   Ellsworth Lennox, PA-C  chlorthalidone (HYGROTON) 25 MG tablet Take 1 tablet (25 mg total) by mouth daily. 01/25/23   Arnette Felts, FNP  Clobetasol Propionate 0.05 % shampoo Apply onto dry scalp once daily, leave on 10 15 minutes and then rinse and shampoo off. 03/13/22   Ellsworth Lennox, PA-C  diphenhydrAMINE (BENADRYL) 25 MG tablet Take 25 mg by mouth every 6 (six) hours as needed for itching, allergies or sleep.    [provider]  doxycycline (VIBRAMYCIN) 100 MG capsule Take 100 mg by mouth 2 (two) times daily. 01/14/23   [provider]  DULoxetine (CYMBALTA) 60 MG capsule Take 60 mg by mouth daily.     [provider]  ezetimibe (ZETIA) 10 MG tablet TAKE 1 TABLET BY MOUTH EVERY DAY Patient taking differently: Take 10 mg by mouth daily. 03/27/21   Arnette Felts, FNP  HYDROcodone-acetaminophen (NORCO) 7.5-325 MG tablet Take 1 tablet by mouth every 6 (six) hours as needed for moderate pain. 01/02/23   [provider]  hydrocortisone cream 1 % Apply to affected area 2 times daily 03/13/22   Ellsworth Lennox, PA-C  hydrOXYzine (ATARAX/VISTARIL) 25 MG tablet Take 1 tablet (25 mg total) by mouth every 8 (eight) hours as needed for itching. 05/15/20   Lamptey, Britta Mccreedy, MD  Lancets [facility] DELICA PLUS [place]) MISC 2 (two) times daily. use for testing 01/17/20   [provider]  Magnesium 250 MG TABS Take 1 tablet (250 mg total) by mouth every evening. With meal 01/25/23   Arnette Felts, FNP  metFORMIN (GLUCOPHAGE) 500 MG tablet TAKE 1 TABLET BY MOUTH 2 TIMES DAILY WITH A MEAL. 11/23/22   Arnette Felts, FNP  methotrexate 50 MG/2ML injection Inject 0.8 mls (20 mg total) into the skin weekly. 07/28/21   Gearldine Bienenstock, PA-C  metoprolol tartrate (LOPRESSOR) 50 MG tablet TAKE 1 TABLET (50 MG TOTAL) BY MOUTH DAILY. PLEASE SCHEDULE APPOINTMENT FOR REFILLS. 11/23/22  Arnette Felts, FNP  [facility] VERIO test strip TEST TWICE DAILY 02/11/20   Arnette Felts, FNP  tiZANidine (ZANAFLEX) 4 MG tablet Take 4 mg by mouth every 6 (six) hours. 12/30/22   [provider]  triamcinolone ointment (KENALOG) 0.1 % Apply 1 application topically daily as needed (rash, itching). 09/30/20   [provider]  Vitamin D, Ergocalciferol, (DRISDOL) 1.25 MG (50000 UNIT) CAPS capsule TAKE 1 CAPSULE BY MOUTH 2 (TWO) TIMES A WEEK. TAKE 1 CAPSULE BY MOUTH ON TUES AND ONE ON FRIDAYS 11/23/22   Arnette Felts, FNP    Physical Exam: Vitals:   06/05/23 2029 06/05/23 2050 06/06/23 0051  BP: 125/76  (!) 174/98  Pulse: (!) 122  (!) 116  Resp: 16  15  Temp: 98.4 F (36.9 C)  98.3 F (36.8 C)  TempSrc: Oral     SpO2: 96%  96%  Weight:  134.3 kg   Height:  5\' 6"  (1.676 m)     GENERAL: 52 y.o.-year-old black female patient, well-developed, well-nourished lying in the bed in mild distress.  HEENT: Head atraumatic, normocephalic. Pupils equal. Mucus membranes moist. NECK: Supple. No JVD. CHEST: Normal breath sounds bilaterally. No wheezing, rales, rhonchi or crackles. No use of accessory muscles of respiration.  No reproducible chest wall tenderness.  CARDIOVASCULAR: S1, S2 normal. No murmurs, rubs, or gallops. Cap refill <2 seconds. Pulses intact distally.  ABDOMEN: Soft, nondistended, quadrant tenderness to palpation CVA tenderness.. No rebound, guarding, rigidity. Normoactive bowel sounds present in all four quadrants.  EXTREMITIES: No pedal edema, cyanosis, or clubbing. No calf tenderness or Homan's sign.  NEUROLOGIC: The patient is alert and oriented x 3. Cranial nerves II through XII are grossly intact with no focal sensorimotor deficit. PSYCHIATRIC:  Normal affect, mood, thought content. SKIN: Warm, dry, and intact without obvious rash, lesion, or ulcer.    Labs on Admission:  CBC: Recent Labs  Lab 06/06/23 0306 06/06/23 0550  WBC 18.1*  --   HGB 15.1* 10.2*  HCT 45.9 30.0*  MCV 89.5  --   PLT 268  --    Basic Metabolic Panel: Recent Labs  Lab 06/06/23 0550  NA 140  K 4.2  CL 115*  GLUCOSE 90  BUN 11  CREATININE 0.70   GFR: Estimated Creatinine Clearance: 118.6 mL/min (by C-G formula based on SCr of 0.7 mg/dL). Liver Function Tests: No results for input(s): "AST", "ALT", "ALKPHOS", "BILITOT", "PROT", "ALBUMIN" in the last 168 hours. No results for input(s): "LIPASE", "AMYLASE" in the last 168 hours. No results for input(s): "AMMONIA" in the last 168 hours. Coagulation Profile: No results for input(s): "INR", "PROTIME" in the last 168 hours. Cardiac Enzymes: No results for input(s): "CKTOTAL", "CKMB", "CKMBINDEX", "TROPONINI" in the last 168 hours. BNP (last 3  results) No results for input(s): "PROBNP" in the last 8760 hours. HbA1C: No results for input(s): "HGBA1C" in the last 72 hours. CBG: No results for input(s): "GLUCAP" in the last 168 hours. Lipid Profile: No results for input(s): "CHOL", "HDL", "LDLCALC", "TRIG", "CHOLHDL", "LDLDIRECT" in the last 72 hours. Thyroid Function Tests: No results for input(s): "TSH", "T4TOTAL", "FREET4", "T3FREE", "THYROIDAB" in the last 72 hours. Anemia Panel: No results for input(s): "VITAMINB12", "FOLATE", "FERRITIN", "TIBC", "IRON", "RETICCTPCT" in the last 72 hours. Urine analysis:    Component Value Date/Time   COLORURINE RED (A) 06/05/2023 2110   APPEARANCEUR TURBID (A) 06/05/2023 2110   LABSPEC  06/05/2023 2110    TEST NOT REPORTED DUE TO COLOR INTERFERENCE OF URINE PIGMENT  PHURINE  06/05/2023 2110    TEST NOT REPORTED DUE TO COLOR INTERFERENCE OF URINE PIGMENT   GLUCOSEU (A) 06/05/2023 2110    TEST NOT REPORTED DUE TO COLOR INTERFERENCE OF URINE PIGMENT   HGBUR (A) 06/05/2023 2110    TEST NOT REPORTED DUE TO COLOR INTERFERENCE OF URINE PIGMENT   HGBUR small 05/04/2010 1120   BILIRUBINUR (A) 06/05/2023 2110    TEST NOT REPORTED DUE TO COLOR INTERFERENCE OF URINE PIGMENT   BILIRUBINUR neg 01/25/2023 1035   KETONESUR (A) 06/05/2023 2110    TEST NOT REPORTED DUE TO COLOR INTERFERENCE OF URINE PIGMENT   PROTEINUR (A) 06/05/2023 2110    TEST NOT REPORTED DUE TO COLOR INTERFERENCE OF URINE PIGMENT   UROBILINOGEN 0.2 01/25/2023 1035   UROBILINOGEN 0.2 09/09/2019 1750   NITRITE (A) 06/05/2023 2110    TEST NOT REPORTED DUE TO COLOR INTERFERENCE OF URINE PIGMENT   LEUKOCYTESUR (A) 06/05/2023 2110    TEST NOT REPORTED DUE TO COLOR INTERFERENCE OF URINE PIGMENT   Sepsis Labs: @LABRCNTIP (procalcitonin:4,lacticidven:4) )No results found for this or any previous visit (from the past 240 hour(s)).   Radiological Exams on Admission: CT ABDOMEN PELVIS W CONTRAST  Result Date: 06/06/2023 CLINICAL  DATA:  52 year old female with abdominal and flank pain. Suspected kidney stone or pyelonephritis. EXAM: CT ABDOMEN AND PELVIS WITH CONTRAST TECHNIQUE: Multidetector CT imaging of the abdomen and pelvis was performed using the standard protocol following bolus administration of intravenous contrast. RADIATION DOSE REDUCTION: This exam was performed according to the departmental dose-optimization program which includes automated exposure control, adjustment of the mA and/or kV according to patient size and/or use of iterative reconstruction technique. CONTRAST:  75mL OMNIPAQUE IOHEXOL 350 MG/ML SOLN COMPARISON:  CT of the abdomen and pelvis 11/26/2021. FINDINGS: Lower chest: Unremarkable. Hepatobiliary: No suspicious cystic or solid hepatic lesions. No intra or extrahepatic biliary ductal dilatation. Numerous partially cavitary gallstones are noted within the lumen of the gallbladder. Gallbladder appears moderately distended. No definite pericholecystic fluid or surrounding inflammatory changes. Pancreas: No pancreatic mass. No pancreatic ductal dilatation. No pancreatic or peripancreatic fluid collections or inflammatory changes. Spleen: Unremarkable. Adrenals/Urinary Tract: Multiple nonobstructive calculi are noted in the collecting systems of both kidneys, largest of which measures up to 2.3 x 1.1 cm in the lower pole of the right kidney. No additional calculi are confidently identified along the course of either ureter or within the lumen of the urinary bladder. No hydroureteronephrosis. Perinephric stranding surrounding the left kidney. Multiple low-attenuation lesions in both kidneys, largest of which are compatible with simple cysts (measuring up to 5.2 cm in the interpolar region of the right kidney), while the smaller lesions are too small to definitively characterize, but also favored to represent cysts (no imaging follow-up recommended). In the interpolar region of the left kidney (axial image 42 series 3)  there is also a relatively poorly defined low-intermediate attenuation (26 HU) region measuring up to 3.3 cm in diameter which enhances less than the surrounding renal parenchyma, concerning for pyelonephritis. Urinary bladder is unremarkable in appearance. Bilateral adrenal glands are normal in appearance. Stomach/Bowel: The appearance of the stomach is normal. No pathologic dilatation of small bowel or colon. The appendix is not confidently identified and may be surgically absent. Regardless, there are no inflammatory changes noted adjacent to the cecum to suggest the presence of an acute appendicitis at this time. Vascular/Lymphatic: Atherosclerotic calcifications are noted in the abdominal aorta and pelvic vasculature. No lymphadenopathy noted in the abdomen or pelvis. Reproductive: Uterus  and ovaries are unremarkable in appearance. Other: No significant volume of ascites.  No pneumoperitoneum. Musculoskeletal: Sclerosis in the anterior aspect of the T8 vertebral body similar to prior CT of the abdomen and pelvis 11/26/2021, of uncertain etiology and significance, but presumably benign. There are no aggressive appearing lytic or blastic lesions noted in the visualized portions of the skeleton. IMPRESSION: 1. Imaging findings, as above, concerning for probable left-sided pyelonephritis. Correlation with urinalysis is recommended. 2. Multiple nonobstructive calculi in the collecting systems of both kidneys. No ureteral stones or findings suggestive of urinary tract infection at this time. 3. Aortic atherosclerosis. 4. Additional incidental findings, as above. Electronically Signed   By: Trudie Reed M.D.   On: 06/06/2023 06:43     Assessment/Plan  This is a 52 y.o. female with a history of DM, fibromyalgia, HTN, OA, RA  now being admitted with:  #. Sepsis secondary to pyelo - Admit to inpatient with telemetry monitoring - IV antibiotics: Rocephin - IV fluid hydration - Follow up UA, blood and urine  cultures - Repeat CBC in am.   #. Hypoxia - Airspace opacity could be infiltrate - Will add Azithro - Check sputum culture and respiratory viral panel  #. Elevated bili - Check RUQ [place]  #. Electrolyte abnormalities - Hypocalcemia, mild hypokalemia and hypomag - Replace IV  #. Multiple nonobstructing kidney stones - IV fluid hydration - Pain control - Flomax  #. H/o Diabetes - Accuchecks achs with RISS coverage - Heart healthy, carb controlled diet - Hold metformin  #. History of HTN - Continue amlodipine, chlorthalidone, metoprolol  #. History of HLD - Continue rosuvastatin, ezetimibe  #. History of RA - Continue Humira q2weeks on D/C  #. History of fibromyalgia - Continue duloxetine, tizanidine   Admission status: Obs IV Fluids: NS Diet/Nutrition: Heart healthy carb controlled Consults called: None  DVT Px: SCDs and early ambulation. Code Status: Full Code  Disposition Plan: To home in less than 24 hours  All the records are reviewed and case discussed with ED provider. Management plans discussed with the patient and/or family who express understanding and agree with plan of care.  Rollo Farquhar D.O. on 06/06/2023 at 8:25 AM CC: Primary care physician; Arnette Felts, FNP   06/06/2023, 8:25 AM

## 2023-06-06 NOTE — ED Notes (Signed)
ED TO INPATIENT HANDOFF REPORT  ED Nurse Name and Phone #: Henriette Combs [phone]  S Name/Age/Gender Faylene Million 52 y.o. female Room/Bed: H011C/H011C  Code Status   Code Status: Prior  Home/SNF/Other Home Patient oriented to: self, place, time, and situation Is this baseline? Yes   Triage Complete: Triage complete  Chief Complaint Pyelonephritis [N12]  Triage Note Pt reports bilateral back and flank pain with blood in urine and frequency x 2 days, denies burning   Allergies No Known Allergies  Level of Care/Admitting Diagnosis ED Disposition     ED Disposition  Admit   Condition  --   Comment  Hospital Area: [facility] [[id number]]  Level of Care: Med-Surg [16]  May place patient in observation at [facility] or [facility] if equivalent level of care is available:: No  Covid Evaluation: Symptomatic Person Under Investigation (PUI) or recent exposure (last 10 days) *Testing Required*  Diagnosis: Pyelonephritis [981191]  Admitting Physician: Tonye Royalty [[id number]]  Attending Physician: Janann Colonel          B Medical/Surgery History Past Medical History:  Diagnosis Date   AMS (altered mental status) 09/17/2019   Diabetes mellitus without complication (HCC)    Eczema    Fibromyalgia    Fibromyalgia    Hypertension    Osteoarthritis    Rheumatoid arthritis(714.0)    Rheumatoid arthritis(714.0)    Sepsis due to Escherichia coli (E. coli) (HCC) 11/28/2021   History reviewed. No pertinent surgical history.   A IV Location/Drains/Wounds Patient Lines/Drains/Airways Status     Active Line/Drains/Airways     Name Placement date Placement time Site Days   Peripheral IV 06/06/23 22 G 1" Anterior;Right Hand 06/06/23  0306  Hand  less than 1            Intake/Output Last 24 hours No intake or output data in the 24 hours ending 06/06/23 0744  Labs/Imaging Results for orders placed or performed during the hospital  encounter of 06/05/23 (from the past 48 hour(s))  Urinalysis, w/ Reflex to Culture (Infection Suspected) -Urine, Clean Catch     Status: Abnormal   Collection Time: 06/05/23  9:10 PM  Result Value Ref Range   Specimen Source URINE, CLEAN CATCH    Color, Urine RED (A) YELLOW   APPearance TURBID (A) CLEAR   Specific Gravity, Urine  1.005 - 1.030    TEST NOT REPORTED DUE TO COLOR INTERFERENCE OF URINE PIGMENT   pH  5.0 - 8.0    TEST NOT REPORTED DUE TO COLOR INTERFERENCE OF URINE PIGMENT   Glucose, UA (A) NEGATIVE mg/dL    TEST NOT REPORTED DUE TO COLOR INTERFERENCE OF URINE PIGMENT   Hgb urine dipstick (A) NEGATIVE    TEST NOT REPORTED DUE TO COLOR INTERFERENCE OF URINE PIGMENT   Bilirubin Urine (A) NEGATIVE    TEST NOT REPORTED DUE TO COLOR INTERFERENCE OF URINE PIGMENT   Ketones, ur (A) NEGATIVE mg/dL    TEST NOT REPORTED DUE TO COLOR INTERFERENCE OF URINE PIGMENT   Protein, ur (A) NEGATIVE mg/dL    TEST NOT REPORTED DUE TO COLOR INTERFERENCE OF URINE PIGMENT   Nitrite (A) NEGATIVE    TEST NOT REPORTED DUE TO COLOR INTERFERENCE OF URINE PIGMENT   Leukocytes,Ua (A) NEGATIVE    TEST NOT REPORTED DUE TO COLOR INTERFERENCE OF URINE PIGMENT   RBC / HPF >50 0 - 5 RBC/hpf   WBC, UA 21-50 0 - 5 WBC/hpf    Comment:  Reflex urine culture not performed if WBC <=10, OR if Squamous epithelial cells >5. If Squamous epithelial cells >5 suggest recollection.    Bacteria, UA MANY (A) NONE SEEN   Squamous Epithelial / HPF 0-5 0 - 5 /HPF    Comment: Performed at [facility], [address], [place], [place] [address]  Pregnancy, urine     Status: None   Collection Time: 06/05/23  9:10 PM  Result Value Ref Range   Preg Test, Ur NEGATIVE NEGATIVE    Comment:        THE SENSITIVITY OF THIS METHODOLOGY IS >25 mIU/mL. Performed at [facility], [address], [place], [place] [address]   CBC     Status: Abnormal   Collection Time: 06/06/23  3:06 AM  Result Value Ref Range    WBC 18.1 (H) 4.0 - 10.5 K/uL   RBC 5.13 (H) 3.87 - 5.11 MIL/uL   Hemoglobin 15.1 (H) 12.0 - 15.0 g/dL   HCT 09.8 11.9 - 14.7 %   MCV 89.5 80.0 - 100.0 fL   MCH 29.4 26.0 - 34.0 pg   MCHC 32.9 30.0 - 36.0 g/dL   RDW 82.9 56.2 - 13.0 %   Platelets 268 150 - 400 K/uL   nRBC 0.0 0.0 - 0.2 %    Comment: Performed at [facility], [address] [place], [place] [address]  I-stat chem 8, ED (not at [facility], [facility] or [facility])     Status: Abnormal   Collection Time: 06/06/23  5:50 AM  Result Value Ref Range   Sodium 140 135 - 145 mmol/L   Potassium 4.2 3.5 - 5.1 mmol/L   Chloride 115 (H) 98 - 111 mmol/L   BUN 11 6 - 20 mg/dL   Creatinine, Ser 4.69 0.44 - 1.00 mg/dL   Glucose, Bld 90 70 - 99 mg/dL    Comment: Glucose reference range applies only to samples taken after fasting for at least 8 hours.   Calcium, Ion 0.66 (LL) 1.15 - 1.40 mmol/L   TCO2 17 (L) 22 - 32 mmol/L   Hemoglobin 10.2 (L) 12.0 - 15.0 g/dL   HCT 62.9 (L) 52.8 - 41.3 %   Comment NOTIFIED PHYSICIAN    CT ABDOMEN PELVIS W CONTRAST  Result Date: 06/06/2023 CLINICAL DATA:  52 year old female with abdominal and flank pain. Suspected kidney stone or pyelonephritis. EXAM: CT ABDOMEN AND PELVIS WITH CONTRAST TECHNIQUE: Multidetector CT imaging of the abdomen and pelvis was performed using the standard protocol following bolus administration of intravenous contrast. RADIATION DOSE REDUCTION: This exam was performed according to the departmental dose-optimization program which includes automated exposure control, adjustment of the mA and/or kV according to patient size and/or use of iterative reconstruction technique. CONTRAST:  75mL OMNIPAQUE IOHEXOL 350 MG/ML SOLN COMPARISON:  CT of the abdomen and pelvis 11/26/2021. FINDINGS: Lower chest: Unremarkable. Hepatobiliary: No suspicious cystic or solid hepatic lesions. No intra or extrahepatic biliary ductal dilatation. Numerous partially cavitary gallstones are noted within the lumen of the  gallbladder. Gallbladder appears moderately distended. No definite pericholecystic fluid or surrounding inflammatory changes. Pancreas: No pancreatic mass. No pancreatic ductal dilatation. No pancreatic or peripancreatic fluid collections or inflammatory changes. Spleen: Unremarkable. Adrenals/Urinary Tract: Multiple nonobstructive calculi are noted in the collecting systems of both kidneys, largest of which measures up to 2.3 x 1.1 cm in the lower pole of the right kidney. No additional calculi are confidently identified along the course of either ureter or within the  lumen of the urinary bladder. No hydroureteronephrosis. Perinephric stranding surrounding the left kidney. Multiple low-attenuation lesions in both kidneys, largest of which are compatible with simple cysts (measuring up to 5.2 cm in the interpolar region of the right kidney), while the smaller lesions are too small to definitively characterize, but also favored to represent cysts (no imaging follow-up recommended). In the interpolar region of the left kidney (axial image 42 series 3) there is also a relatively poorly defined low-intermediate attenuation (26 HU) region measuring up to 3.3 cm in diameter which enhances less than the surrounding renal parenchyma, concerning for pyelonephritis. Urinary bladder is unremarkable in appearance. Bilateral adrenal glands are normal in appearance. Stomach/Bowel: The appearance of the stomach is normal. No pathologic dilatation of small bowel or colon. The appendix is not confidently identified and may be surgically absent. Regardless, there are no inflammatory changes noted adjacent to the cecum to suggest the presence of an acute appendicitis at this time. Vascular/Lymphatic: Atherosclerotic calcifications are noted in the abdominal aorta and pelvic vasculature. No lymphadenopathy noted in the abdomen or pelvis. Reproductive: Uterus and ovaries are unremarkable in appearance. Other: No significant volume of  ascites.  No pneumoperitoneum. Musculoskeletal: Sclerosis in the anterior aspect of the T8 vertebral body similar to prior CT of the abdomen and pelvis 11/26/2021, of uncertain etiology and significance, but presumably benign. There are no aggressive appearing lytic or blastic lesions noted in the visualized portions of the skeleton. IMPRESSION: 1. Imaging findings, as above, concerning for probable left-sided pyelonephritis. Correlation with urinalysis is recommended. 2. Multiple nonobstructive calculi in the collecting systems of both kidneys. No ureteral stones or findings suggestive of urinary tract infection at this time. 3. Aortic atherosclerosis. 4. Additional incidental findings, as above. Electronically Signed   By: Trudie Reed M.D.   On: 06/06/2023 06:43    Pending Labs Unresulted Labs (From admission, onward)     Start     Ordered   06/06/23 0723  Culture, blood (Routine X 2) w Reflex to ID Panel  BLOOD CULTURE X 2,   R (with STAT occurrences)      06/06/23 0722   06/06/23 0723  Lactic acid, plasma  (Lactic Acid)  Now then every 2 hours,   R (with STAT occurrences)      06/06/23 0722   06/06/23 0630  Comprehensive metabolic panel  Once,   R        06/06/23 0630   06/06/23 0630  Lipase, blood  Once,   R        06/06/23 0630   06/05/23 2110  Urine Culture  Once,   R        06/05/23 2110            Vitals/Pain Today's Vitals   06/05/23 2050 06/05/23 2051 06/06/23 0051 06/06/23 0235  BP:   (!) 174/98   Pulse:   (!) 116   Resp:   15   Temp:   98.3 F (36.8 C)   TempSrc:      SpO2:   96%   Weight: 134.3 kg     Height: 5\' 6"  (1.676 m)     PainSc:  5   7     Isolation Precautions No active isolations  Medications Medications  sodium chloride 0.9 % bolus 1,000 mL (0 mLs Intravenous Stopped 06/06/23 0641)  ketorolac (TORADOL) 15 MG/ML injection 15 mg (15 mg Intravenous Given 06/06/23 0307)  morphine (PF) 4 MG/ML injection 4 mg (4 mg Intravenous Given 06/06/23  1610)   cefTRIAXone (ROCEPHIN) 2 g in sodium chloride 0.9 % 100 mL IVPB (0 g Intravenous Stopped 06/06/23 0641)  iohexol (OMNIPAQUE) 350 MG/ML injection 75 mL (75 mLs Intravenous Contrast Given 06/06/23 0608)  sodium chloride 0.9 % bolus 1,000 mL (1,000 mLs Intravenous New Bag/Given 06/06/23 0734)    Mobility walks     Focused Assessments Renal Assessment Handoff:  Hemodialysis Schedule: n/a Last Hemodialysis date and time: n/a   Restricted appendage: n/a   R Recommendations: See Admitting Provider Note  Report given to:   Additional Notes: pt has been having episodes of incontinence.

## 2023-06-06 NOTE — Progress Notes (Signed)
Called by nurse due to hypoxia - down to 86% on 6L.  Resp panel negative, CXR with mild prominence possibly infiltrate.  Will check a stat DDimer and if positive, send for CTA chest to eval for PE.   O2, nebs ordered on admission.  Will follow closely.

## 2023-06-06 NOTE — Sepsis Progress Note (Signed)
Blood cultures & lactic acid not done in ED. Spoke with RN on 5C. States he will have labs drawn. Thank you

## 2023-06-06 NOTE — Plan of Care (Signed)
Patient alert/oriented X4. Patient arrived to [facility] with a heart rate of 146. MD notified for bed placement change to a med/tele unit. Also, patient had an oxygen level of 86% on room air. Patient now on 2L nasal cannula stating > 95%. Patient complains of pain in lower right hip, hydrocodone administered for pain. Rapid response called due to high heart rate and low oxygen. Patient now lying comfortable in bed sleeping. Patient and mother aware of bed placement change. Will continue to monitor. Patient tolerating cont. IV fluids.   Problem: Fluid Volume: Goal: Hemodynamic stability will improve Outcome: Progressing   Problem: Clinical Measurements: Goal: Diagnostic test results will improve Outcome: Progressing   Problem: Clinical Measurements: Goal: Signs and symptoms of infection will decrease Outcome: Progressing   Problem: Respiratory: Goal: Ability to maintain adequate ventilation will improve Outcome: Progressing   Problem: Education: Goal: Knowledge of General Education information will improve Description: Including pain rating scale, medication(s)/side effects and non-pharmacologic comfort measures Outcome: Progressing   Problem: Health Behavior/Discharge Planning: Goal: Ability to manage health-related needs will improve Outcome: Progressing   Problem: Clinical Measurements: Goal: Ability to maintain clinical measurements within normal limits will improve Outcome: Progressing   Problem: Clinical Measurements: Goal: Will remain free from infection Outcome: Progressing   Problem: Clinical Measurements: Goal: Diagnostic test results will improve Outcome: Progressing   Problem: Clinical Measurements: Goal: Respiratory complications will improve Outcome: Progressing   Problem: Clinical Measurements: Goal: Cardiovascular complication will be avoided Outcome: Progressing   Problem: Activity: Goal: Risk for activity intolerance will decrease Outcome: Progressing    Problem: Nutrition: Goal: Adequate nutrition will be maintained Outcome: Progressing   Problem: Coping: Goal: Level of anxiety will decrease Outcome: Progressing   Problem: Elimination: Goal: Will not experience complications related to bowel motility Outcome: Progressing   Problem: Elimination: Goal: Will not experience complications related to urinary retention Outcome: Progressing   Problem: Pain Managment: Goal: General experience of comfort will improve Outcome: Progressing   Problem: Safety: Goal: Ability to remain free from injury will improve Outcome: Progressing   Problem: Skin Integrity: Goal: Risk for impaired skin integrity will decrease Outcome: Progressing

## 2023-06-07 DIAGNOSIS — N12 Tubulo-interstitial nephritis, not specified as acute or chronic: Secondary | ICD-10-CM | POA: Diagnosis not present

## 2023-06-07 LAB — BASIC METABOLIC PANEL
Anion gap: 10 (ref 5–15)
BUN: 16 mg/dL (ref 6–20)
CO2: 22 mmol/L (ref 22–32)
Calcium: 8.6 mg/dL — ABNORMAL LOW (ref 8.9–10.3)
Chloride: 99 mmol/L (ref 98–111)
Creatinine, Ser: 1.13 mg/dL — ABNORMAL HIGH (ref 0.44–1.00)
GFR, Estimated: 59 mL/min — ABNORMAL LOW (ref >60)
Glucose, Bld: 131 mg/dL — ABNORMAL HIGH (ref 70–99)
Potassium: 3.6 mmol/L (ref 3.5–5.1)
Sodium: 131 mmol/L — ABNORMAL LOW (ref 135–145)

## 2023-06-07 LAB — MAGNESIUM: Magnesium: 2.1 mg/dL (ref 1.7–2.4)

## 2023-06-07 LAB — CBC
HCT: 35.9 % — ABNORMAL LOW (ref 36.0–46.0)
Hemoglobin: 11.9 g/dL — ABNORMAL LOW (ref 12.0–15.0)
MCH: 29.2 pg (ref 26.0–34.0)
MCHC: 33.1 g/dL (ref 30.0–36.0)
MCV: 88.2 fL (ref 80.0–100.0)
Platelets: 178 10*3/uL (ref 150–400)
RBC: 4.07 MIL/uL (ref 3.87–5.11)
RDW: 14.7 % (ref 11.5–15.5)
WBC: 12.1 10*3/uL — ABNORMAL HIGH (ref 4.0–10.5)
nRBC: 0 % (ref 0.0–0.2)

## 2023-06-07 LAB — MRSA NEXT GEN BY PCR, NASAL: MRSA by PCR Next Gen: NOT DETECTED

## 2023-06-07 LAB — GLUCOSE, CAPILLARY
Glucose-Capillary: 146 mg/dL — ABNORMAL HIGH (ref 70–99)
Glucose-Capillary: 128 mg/dL — ABNORMAL HIGH (ref 70–99)
Glucose-Capillary: 140 mg/dL — ABNORMAL HIGH (ref 70–99)
Glucose-Capillary: 125 mg/dL — ABNORMAL HIGH (ref 70–99)
Glucose-Capillary: 147 mg/dL — ABNORMAL HIGH (ref 70–99)

## 2023-06-07 LAB — PHOSPHORUS: Phosphorus: 2.5 mg/dL (ref 2.5–4.6)

## 2023-06-07 MED ORDER — SODIUM CHLORIDE 0.9 % IV SOLN
2.0000 g | INTRAVENOUS | Status: DC
  Administered 2023-06-07 – 2023-06-09 (×3): 2 g via INTRAVENOUS
  Filled 2023-06-07 (×3): qty 20

## 2023-06-07 MED ORDER — ENOXAPARIN SODIUM 60 MG/0.6ML IJ SOSY
60.0000 mg | PREFILLED_SYRINGE | INTRAMUSCULAR | Status: DC
  Administered 2023-06-07 – 2023-06-10 (×4): 60 mg via SUBCUTANEOUS
  Filled 2023-06-07 (×4): qty 0.6

## 2023-06-07 NOTE — Progress Notes (Signed)
Pt wore Bipap for a little over 4 hours before needing a break. Pt initially placed on [place] at 15 liters but able to titrate quickly down to 12 L. Pts O2 sat able to maintain 98%.   RR 28-32 on Bipap with no change in RR when placed back on [place]. Pt is in no distress. Lungs sounds clear/diminished.  Of note, patient has fake nails and O2 sat remains around 88% on finger, however when monitoring O2 sat on ear, her saturation is 98%.   Will continue to titrate oxygen as able.

## 2023-06-07 NOTE — Progress Notes (Signed)
Around 0530 pt HR monitoring ringing out for "extreme tachycardia" with a heart rate of 190-200. Immediatly went into patients room. Patient asleep, in no acute distress. Woke patient up and she reported no feelings of shortness of breath or heart racing. Rhythm on monitor looked to be SVT.  BP cycled while patients HR 190-200s. Pts BP 127/78.  Pt maintained HR 190-200 for about 1 minute and then spontaneously converted back to her baseline ST of 120's.  Rhythm strip of event printed off and placed in shadow chart.

## 2023-06-07 NOTE — Progress Notes (Addendum)
NAME:  Audryna Propson, MRN:  [id number], DOB:  October 15, 1971, LOS: 1 ADMISSION DATE:  06/05/2023, CONSULTATION DATE:  06/06/23 REFERRING MD:  TRH, CHIEF COMPLAINT:  Hypoxemia   History of Present Illness:  52 year old woman admitted with severe sepsis due to polynephritis with worsening hypoxemia prompting ICU transfer.   Presented to ED febrile.  Noted to have some abdominal pain.  CT abdomen pelvis notable for pyelonephritis.  Scattered nonobstructing stones no hydronephrosis etc.  Admitted to the floor.  Started on antibiotics.  Worsening hypoxemia throughout the day.  Initial chest x-ray on my review demonstrates chronically elevated right hemidiaphragm with what appears to be interstitial markings concerning for possible atypical infection.  Respiratory viral panel is negative.  Went for CTA PE protocol.  Read pending I do not see any obvious PE.  Scattered atelectatic changes as well as linear like infiltrates, edema versus infection.  On 15 L nasal cannula slightly tachypneic with no accessory muscle use.  Relatively comfortable.  Notably O2 saturation not really improving despite escalation from room air to 2 L up to 15 L salter.  Argues for some shunt physiology as below.  Transferred to ICU for NIPPV.  Pertinent  Medical History  Prediabetes, hypertension, hyperlipidemia, OSA not on CPAP   Significant Hospital Events: Including procedures, antibiotic start and stop dates in addition to other pertinent events   8/19 admitted with fever, pyelonephritis c/b AHRF, transferred to ICU for hypoxemia   Interim History / Subjective:  On the morning of 8/20, she had a run of SVT with HR 190-200 that spontaneously returned to baseline ST of 120 within a minute. No such episodes since then. She denies associated shortness of breath, dizziness, or chest pain. She was able to tolerate BiPAP for about 4 hours last night. Endorses some urinary frequency last night. No flank pain.   Objective   Blood  pressure 124/82, pulse (!) 102, temperature 99.5 F (37.5 C), temperature source Oral, resp. rate (!) 28, height 5\' 6"  (1.676 m), weight 133.5 kg, SpO2 95%.    FiO2 (%):  [60 %] 60 %   Intake/Output Summary (Last 24 hours) at 06/07/2023 1204 Last data filed at 06/07/2023 0800 Gross per 24 hour  Intake 2214.57 ml  Output 1400 ml  Net 814.57 ml   Filed Weights   06/05/23 2050 06/06/23 2101  Weight: 134.3 kg 133.5 kg    Examination: General: Sitting up in bed, in no acute distress HENT: EOMI, no icterus, moist mucous membranes Lungs: Crackles on the RLL, no excessive work of breathing, no accessory muscle use, mild tachypnea  Cardiovascular: Tachycardic, regular rate and rhythm. No murmurs, rubs, or gallops.  Abdomen: Nontender, bowel sounds present Neuro: Alert, oriented, no focal deficits Extremities: No lower extremity edema   Resolved Hospital Problem list   N/A  Assessment & Plan:  Acute Hypoxemic Respiratory Failure  Atelectasis  In the setting of untreated OSA, and chronic right hemidiaphragm paralysis, etiology for the airspace disease seen on CTA PE protocol is most likely atelectasis and less likely to be a pneumonia. Improved oxygen requirement at this time, currenly on 4 L nasal cannula.  - De-escalate antibiotics to ceftriaxone  - Transfer to floor  - Fever control with Tylenol  - Encourage IS use   Sepsis due to Pyelonephritis  Urine culture positive for E.coli, susceptibilities pending.  - De-escalate antibiotics to ceftriaxone  - Bcx NG@24h , continue to follow   AKI  Previously received IV fluids. End organ damage from sepsis.  -  Cr is stable. Will continue to monitor and consider IV fluids.   Elevated Bilirubin  Sludge seen on ultrasound. No concern for cholecystitis as asymptomatic.  - Continue to monitor   Hypertension  - Continue amlodipine, chlorthalidone, metoprolol   Prediabetes  - Sliding scale insulin   Hyperlipidemia  - Continue statin    Best Practice (right click and "Reselect all SmartList Selections" daily)   Diet/type: Regular  DVT prophylaxis: systemic heparin GI prophylaxis: N/A Lines: N/A Foley:  N/A Code Status:  full code Last date of multidisciplinary goals of care discussion [updated patient and family at bedside]  Labs   CBC: Recent Labs  Lab 06/06/23 0306 06/06/23 0550 06/07/23 0115  WBC 18.1*  --  12.1*  HGB 15.1* 10.2* 11.9*  HCT 45.9 30.0* 35.9*  MCV 89.5  --  88.2  PLT 268  --  178    Basic Metabolic Panel: Recent Labs  Lab 06/06/23 0550 06/06/23 0905 06/06/23 1102 06/07/23 0445  NA 140 135  --  131*  K 4.2 3.4*  --  3.6  CL 115* 102  --  99  CO2  --  21*  --  22  GLUCOSE 90 167*  --  131*  BUN 11 10  --  16  CREATININE 0.70 1.19*  --  1.13*  CALCIUM  --  8.3*  --  8.6*  MG  --   --  1.5* 2.1  PHOS  --   --  2.6 2.5   GFR: Estimated Creatinine Clearance: 83.7 mL/min (A) (by C-G formula based on SCr of 1.13 mg/dL (H)). Recent Labs  Lab 06/06/23 0306 06/06/23 0905 06/06/23 1102 06/06/23 1306 06/07/23 0115  WBC 18.1*  --   --   --  12.1*  LATICACIDVEN  --  1.6 1.1 1.2  --     Liver Function Tests: Recent Labs  Lab 06/06/23 0905  AST 15  ALT 17  ALKPHOS 62  BILITOT 1.7*  PROT 7.4  ALBUMIN 3.1*   Recent Labs  Lab 06/06/23 0905  LIPASE 20   No results for input(s): "AMMONIA" in the last 168 hours.  ABG    Component Value Date/Time   PHART 7.43 06/06/2023 2025   PCO2ART 38 06/06/2023 2025   PO2ART 100 06/06/2023 2025   HCO3 25.0 06/06/2023 2025   TCO2 17 (L) 06/06/2023 0550   O2SAT 98.3 06/06/2023 2025     Coagulation Profile: Recent Labs  Lab 06/06/23 1102  INR 1.3*    Cardiac Enzymes: No results for input(s): "CKTOTAL", "CKMB", "CKMBINDEX", "TROPONINI" in the last 168 hours.  HbA1C: Hemoglobin A1C  Date/Time Value Ref Range Status  02/07/2020 12:00 AM 6.6  Final   Hgb A1c MFr Bld  Date/Time Value Ref Range Status  01/25/2023 09:57 AM  5.8 (H) 4.8 - 5.6 % Final    Comment:             Prediabetes: 5.7 - 6.4          Diabetes: >6.4          Glycemic control for adults with diabetes: <7.0   06/07/2022 02:41 PM 5.8 (H) 4.8 - 5.6 % Final    Comment:             Prediabetes: 5.7 - 6.4          Diabetes: >6.4          Glycemic control for adults with diabetes: <7.0     CBG: Recent Labs  Lab  06/06/23 1248 06/06/23 1737 06/06/23 2058 06/07/23 0714 06/07/23 1115  GLUCAP 174* 151* 150* 146* 128*    Review of Systems:   As per HPI  Past Medical History:  She,  has a past medical history of AMS (altered mental status) (09/17/2019), Diabetes mellitus without complication (HCC), Eczema, Fibromyalgia, Fibromyalgia, Hypertension, Osteoarthritis, Rheumatoid arthritis(714.0), Rheumatoid arthritis(714.0), and Sepsis due to Escherichia coli (E. coli) (HCC) (11/28/2021).   Surgical History:  History reviewed. No pertinent surgical history.   Social History:   reports that she has been smoking cigarettes. She has a 10 pack-year smoking history. She has never been exposed to tobacco smoke. She has never used smokeless tobacco. She reports that she does not currently use alcohol. She reports that she does not currently use drugs after having used the following drugs: Hydrocodone.   Family History:  Her family history includes Diabetes in her mother; Healthy in her father; Heart attack in her maternal grandmother; Stroke in her maternal grandmother.   Allergies No Known Allergies   Home Medications  Prior to Admission medications   Medication Sig Start Date End Date Taking? Authorizing Provider  acetaminophen (TYLENOL) 325 MG tablet Take 2 tablets (650 mg total) by mouth every 6 (six) hours as needed for mild pain (or Fever >/= 101). 11/30/21  Yes Elgergawy, Leana Roe, MD  albuterol (PROVENTIL) (2.5 MG/3ML) 0.083% nebulizer solution Take 3 mLs (2.5 mg total) by nebulization every 6 (six) hours as needed for wheezing or  shortness of breath. 07/13/21  Yes Arnette Felts, FNP  albuterol (VENTOLIN HFA) 108 (90 Base) MCG/ACT inhaler Inhale 2 puffs into the lungs every 4 (four) hours as needed for wheezing or shortness of breath (cough, shortness of breath or wheezing.). 10/11/19  Yes Elvina Sidle, MD  amLODipine (NORVASC) 10 MG tablet Take 1 tablet (10 mg total) by mouth daily. 06/07/22  Yes Arnette Felts, FNP  aspirin 325 MG tablet Take 325 mg by mouth every 6 (six) hours as needed (pain).   Yes [provider]  atorvastatin (LIPITOR) 20 MG tablet TAKE 1 TABLET BY MOUTH EVERY DAY 11/23/22  Yes Arnette Felts, FNP  cetirizine (ZYRTEC ALLERGY) 10 MG tablet Take 1 tablet (10 mg total) by mouth daily. 03/13/22  Yes Ellsworth Lennox, PA-C  diphenhydrAMINE (BENADRYL) 25 MG tablet Take 25 mg by mouth every 6 (six) hours as needed for itching, allergies or sleep.   Yes [provider]  doxycycline (VIBRAMYCIN) 100 MG capsule Take 100 mg by mouth 2 (two) times daily. With meals 01/14/23  Yes [provider]  DULoxetine (CYMBALTA) 60 MG capsule Take 60 mg by mouth daily.   Yes [provider]  ezetimibe (ZETIA) 10 MG tablet TAKE 1 TABLET BY MOUTH EVERY DAY Patient taking differently: Take 10 mg by mouth daily. 03/27/21  Yes Arnette Felts, FNP  HYDROcodone-acetaminophen (NORCO) 7.5-325 MG tablet Take 1 tablet by mouth every 6 (six) hours as needed for moderate pain. 01/02/23  Yes [provider]  hydrOXYzine (ATARAX/VISTARIL) 25 MG tablet Take 1 tablet (25 mg total) by mouth every 8 (eight) hours as needed for itching. 05/15/20  Yes Lamptey, Britta Mccreedy, MD  Magnesium 250 MG TABS Take 1 tablet (250 mg total) by mouth every evening. With meal 01/25/23  Yes Arnette Felts, FNP  metFORMIN (GLUCOPHAGE) 500 MG tablet TAKE 1 TABLET BY MOUTH 2 TIMES DAILY WITH A MEAL. 11/23/22  Yes Arnette Felts, FNP  methotrexate 50 MG/2ML injection Inject 0.8 mls (20 mg total) into the skin weekly.  07/28/21  Yes Gearldine Bienenstock, PA-C  metoprolol tartrate (LOPRESSOR) 50 MG tablet TAKE 1 TABLET (50 MG TOTAL) BY MOUTH DAILY. PLEASE SCHEDULE APPOINTMENT FOR REFILLS. 11/23/22  Yes Arnette Felts, FNP  tiZANidine (ZANAFLEX) 4 MG tablet Take 4 mg by mouth every 6 (six) hours. 12/30/22  Yes [provider]  triamcinolone ointment (KENALOG) 0.1 % Apply 1 application topically daily as needed (rash, itching). 09/30/20  Yes [provider]  Vitamin D, Ergocalciferol, (DRISDOL) 1.25 MG (50000 UNIT) CAPS capsule TAKE 1 CAPSULE BY MOUTH 2 (TWO) TIMES A WEEK. TAKE 1 CAPSULE BY MOUTH ON TUES AND ONE ON FRIDAYS 11/23/22  Yes Arnette Felts, FNP  Adalimumab (HUMIRA PEN) 40 MG/0.8ML PNKT INJECT 1 PEN INTO THE SKIN EVERY 14 DAYS Patient not taking: Reported on 06/06/2023 11/11/21 06/06/23  Pollyann Savoy, MD  B-D TB SYRINGE 1CC/27GX1/2" 27G X 1/2" 1 ML MISC USE 1 SYRINGE TO INJECT METHOTREXATE INTO THE SKIN ONCE A WEEK. 07/23/19   Pollyann Savoy, MD  blood glucose meter kit and supplies KIT Dispense based on patient and insurance preference. Use up to two times daily as directed. (FOR ICD-9 250.00, 250.01). 01/17/20   Arnette Felts, FNP  chlorthalidone (HYGROTON) 25 MG tablet Take 1 tablet (25 mg total) by mouth daily. 01/25/23   Arnette Felts, FNP  Clobetasol Propionate 0.05 % shampoo Apply onto dry scalp once daily, leave on 10 15 minutes and then rinse and shampoo off. Patient not taking: Reported on 06/06/2023 03/13/22   Ellsworth Lennox, PA-C  hydrocortisone cream 1 % Apply to affected area 2 times daily Patient not taking: Reported on 06/06/2023 03/13/22   Ellsworth Lennox, PA-C  Lancets [facility] DELICA PLUS [facility]) MISC 2 (two) times daily. use for testing 01/17/20   [provider]  [facility] VERIO test strip TEST TWICE DAILY 02/11/20   Arnette Felts, FNP     .rasi

## 2023-06-07 NOTE — Progress Notes (Signed)
   06/07/23 2058  BiPAP/CPAP/SIPAP  Reason BIPAP/CPAP not in use Other(comment) (refused)

## 2023-06-07 NOTE — Progress Notes (Signed)
Pt transported to 5C10 in wheelchair, no issues. Mother at bedside with all belongings. Receiving RN at bedside.

## 2023-06-07 NOTE — Progress Notes (Signed)
Called [employer] for cooling blanket.   Per [employer] no cooling blankets available at this time. Patient put in waiting list for cooling blanket.

## 2023-06-08 DIAGNOSIS — N12 Tubulo-interstitial nephritis, not specified as acute or chronic: Secondary | ICD-10-CM | POA: Diagnosis not present

## 2023-06-08 LAB — URINE CULTURE: Culture: >=100000 — AB

## 2023-06-08 LAB — BASIC METABOLIC PANEL
Anion gap: 9 (ref 5–15)
BUN: 14 mg/dL (ref 6–20)
CO2: 24 mmol/L (ref 22–32)
Calcium: 8.8 mg/dL — ABNORMAL LOW (ref 8.9–10.3)
Chloride: 99 mmol/L (ref 98–111)
Creatinine, Ser: 1.06 mg/dL — ABNORMAL HIGH (ref 0.44–1.00)
GFR, Estimated: >60 mL/min (ref >60)
Glucose, Bld: 128 mg/dL — ABNORMAL HIGH (ref 70–99)
Potassium: 3.2 mmol/L — ABNORMAL LOW (ref 3.5–5.1)
Sodium: 132 mmol/L — ABNORMAL LOW (ref 135–145)

## 2023-06-08 LAB — CBC
HCT: 33.8 % — ABNORMAL LOW (ref 36.0–46.0)
Hemoglobin: 11.5 g/dL — ABNORMAL LOW (ref 12.0–15.0)
MCH: 29.7 pg (ref 26.0–34.0)
MCHC: 34 g/dL (ref 30.0–36.0)
MCV: 87.3 fL (ref 80.0–100.0)
Platelets: 192 10*3/uL (ref 150–400)
RBC: 3.87 MIL/uL (ref 3.87–5.11)
RDW: 14.6 % (ref 11.5–15.5)
WBC: 7.9 10*3/uL (ref 4.0–10.5)
nRBC: 0 % (ref 0.0–0.2)

## 2023-06-08 LAB — MAGNESIUM: Magnesium: 1.9 mg/dL (ref 1.7–2.4)

## 2023-06-08 LAB — GLUCOSE, CAPILLARY
Glucose-Capillary: 156 mg/dL — ABNORMAL HIGH (ref 70–99)
Glucose-Capillary: 152 mg/dL — ABNORMAL HIGH (ref 70–99)
Glucose-Capillary: 149 mg/dL — ABNORMAL HIGH (ref 70–99)
Glucose-Capillary: 149 mg/dL — ABNORMAL HIGH (ref 70–99)

## 2023-06-08 LAB — PHOSPHORUS: Phosphorus: 2.8 mg/dL (ref 2.5–4.6)

## 2023-06-08 MED ORDER — PNEUMOCOCCAL 20-VAL CONJ VACC 0.5 ML IM SUSY
0.5000 mL | PREFILLED_SYRINGE | INTRAMUSCULAR | Status: AC
  Administered 2023-06-09: 0.5 mL via INTRAMUSCULAR
  Filled 2023-06-08: qty 0.5

## 2023-06-08 MED ORDER — POTASSIUM CHLORIDE CRYS ER 20 MEQ PO TBCR
40.0000 meq | EXTENDED_RELEASE_TABLET | Freq: Once | ORAL | Status: AC
  Administered 2023-06-08: 40 meq via ORAL
  Filled 2023-06-08: qty 2

## 2023-06-08 NOTE — Progress Notes (Signed)
PROGRESS NOTE    Shelby Robertson  QIO:[id number] DOB: September 28, 1971 DOA: 06/05/2023 PCP: Arnette Felts, FNP    Brief Narrative:  Shelby Robertson is a 52 y.o. female with a known history of DM, fibromyalgia, HTN, OA, RA presents to the emergency department for evaluation of flank pain.  Patient was in a usual state of health until 2 days ago when she developed lower left sided abdominal pain radiating around to her flank associated with hematuria.  At this time complains only of left lower quadrant abdominal pain.  She denies fevers, dysuria, urinary frequency.   Upon arrival to the floor patient experienced shortness of breath associate with hypoxia at 86% on room air and tachycardia to 147.     Assessment and Plan: Acute Hypoxemic Respiratory Failure  Atelectasis  In the setting of untreated OSA, and chronic right hemidiaphragm paralysis, etiology for the airspace disease seen on CTA PE protocol is most likely atelectasis and less likely to be a pneumonia. Improved oxygen requirement at this time, currenly on 4 L nasal cannula.  - De-escalate antibiotics to ceftriaxone  - Transfer to floor  - Fever control with Tylenol  - Encourage IS use  -encourage CPAP uses   Sepsis due to Pyelonephritis  Urine culture positive for E.coli, susceptibilities pending.  - De-escalate antibiotics to ceftriaxone  - Bcx NG@24h , continue to follow    AKI  Previously received IV fluids. End organ damage from sepsis.    Elevated Bilirubin  Sludge seen on ultrasound. No concern for cholecystitis as asymptomatic.  - Continue to monitor    Hypertension  - Continue amlodipine, chlorthalidone, metoprolol    Prediabetes  - Sliding scale insulin    Hyperlipidemia  - Continue statin   Obesity Estimated body mass index is 47.5 kg/m as calculated from the following:   Height as of this encounter: 5\' 6"  (1.676 m).   Weight as of this encounter: 133.5 kg.   Hypokalemia -replete   DVT prophylaxis:  SCDs Start: 06/06/23 0911    Code Status: Full Code   Disposition Plan:  Level of care: Med-Surg Status is: Inpatient Remains inpatient appropriate    Consultants:  PCCM   Subjective: Did not wear CPAP last night  Objective: Vitals:   06/08/23 0446 06/08/23 0728 06/08/23 0807 06/08/23 1000  BP: (!) 154/80 137/72    Pulse: (!) 124 (!) 130 (!) 120   Resp: 17 18  16   Temp: 99.1 F (37.3 C) 99.4 F (37.4 C)    TempSrc: Oral     SpO2: 98% (!) 59% (!) 79% 100%  Weight:      Height:        Intake/Output Summary (Last 24 hours) at 06/08/2023 1239 Last data filed at 06/08/2023 3244 Gross per 24 hour  Intake 100 ml  Output 1 ml  Net 99 ml   Filed Weights   06/05/23 2050 06/06/23 2101  Weight: 134.3 kg 133.5 kg    Examination:   General: Appearance:    Severely obese female in no acute distress     Lungs:     On [place], diminished, respirations unlabored  Heart:    Tachycardic.    MS:   All extremities are intact.    Neurologic:   Awake, alert, oriented x 3. No apparent focal neurological           defect.        Data Reviewed: I have personally reviewed following labs and imaging studies  CBC: Recent Labs  Lab  06/06/23 0306 06/06/23 0550 06/07/23 0115 06/08/23 0302  WBC 18.1*  --  12.1* 7.9  HGB 15.1* 10.2* 11.9* 11.5*  HCT 45.9 30.0* 35.9* 33.8*  MCV 89.5  --  88.2 87.3  PLT 268  --  178 192   Basic Metabolic Panel: Recent Labs  Lab 06/06/23 0550 06/06/23 0905 06/06/23 1102 06/07/23 0445 06/08/23 0302  NA 140 135  --  131* 132*  K 4.2 3.4*  --  3.6 3.2*  CL 115* 102  --  99 99  CO2  --  21*  --  22 24  GLUCOSE 90 167*  --  131* 128*  BUN 11 10  --  16 14  CREATININE 0.70 1.19*  --  1.13* 1.06*  CALCIUM  --  8.3*  --  8.6* 8.8*  MG  --   --  1.5* 2.1 1.9  PHOS  --   --  2.6 2.5 2.8   GFR: Estimated Creatinine Clearance: 89.2 mL/min (A) (by C-G formula based on SCr of 1.06 mg/dL (H)). Liver Function Tests: Recent Labs  Lab  06/06/23 0905  AST 15  ALT 17  ALKPHOS 62  BILITOT 1.7*  PROT 7.4  ALBUMIN 3.1*   Recent Labs  Lab 06/06/23 0905  LIPASE 20   No results for input(s): "AMMONIA" in the last 168 hours. Coagulation Profile: Recent Labs  Lab 06/06/23 1102  INR 1.3*   Cardiac Enzymes: No results for input(s): "CKTOTAL", "CKMB", "CKMBINDEX", "TROPONINI" in the last 168 hours. BNP (last 3 results) No results for input(s): "PROBNP" in the last 8760 hours. HbA1C: No results for input(s): "HGBA1C" in the last 72 hours. CBG: Recent Labs  Lab 06/07/23 1115 06/07/23 1518 06/07/23 1721 06/07/23 2031 06/08/23 0729  GLUCAP 128* 140* 125* 147* 156*   Lipid Profile: No results for input(s): "CHOL", "HDL", "LDLCALC", "TRIG", "CHOLHDL", "LDLDIRECT" in the last 72 hours. Thyroid Function Tests: No results for input(s): "TSH", "T4TOTAL", "FREET4", "T3FREE", "THYROIDAB" in the last 72 hours. Anemia Panel: No results for input(s): "VITAMINB12", "FOLATE", "FERRITIN", "TIBC", "IRON", "RETICCTPCT" in the last 72 hours. Sepsis Labs: Recent Labs  Lab 06/06/23 0905 06/06/23 1102 06/06/23 1306  LATICACIDVEN 1.6 1.1 1.2    Recent Results (from the past 240 hour(s))  Urine Culture     Status: Abnormal   Collection Time: 06/05/23  9:10 PM   Specimen: Urine, Random  Result Value Ref Range Status   Specimen Description URINE, RANDOM  Final   Special Requests   Final    NONE Reflexed from [id number] Performed at [facility], [address], [place], [place] [address]    Culture >=100,000 COLONIES/mL ESCHERICHIA COLI (A)  Final   Report Status 06/08/2023 FINAL  Final   Organism ID, Bacteria ESCHERICHIA COLI (A)  Final      Susceptibility   Escherichia coli - MIC*    AMPICILLIN <=2 SENSITIVE Sensitive     CEFAZOLIN <=4 SENSITIVE Sensitive     CEFEPIME <=0.12 SENSITIVE Sensitive     CEFTRIAXONE <=0.25 SENSITIVE Sensitive     CIPROFLOXACIN <=0.25 SENSITIVE Sensitive     GENTAMICIN <=1 SENSITIVE  Sensitive     IMIPENEM <=0.25 SENSITIVE Sensitive     NITROFURANTOIN <=16 SENSITIVE Sensitive     TRIMETH/SULFA <=20 SENSITIVE Sensitive     AMPICILLIN/SULBACTAM <=2 SENSITIVE Sensitive     PIP/TAZO <=4 SENSITIVE Sensitive     * >=100,000 COLONIES/mL ESCHERICHIA COLI  Culture, blood (Routine X 2) w Reflex to ID Panel  Status: None (Preliminary result)   Collection Time: 06/06/23  9:05 AM   Specimen: BLOOD  Result Value Ref Range Status   Specimen Description BLOOD SITE NOT SPECIFIED  Final   Special Requests   Final    BOTTLES DRAWN AEROBIC AND ANAEROBIC Blood Culture adequate volume   Culture   Final    NO GROWTH 2 DAYS Performed at [facility], [address], [place], [place] [address]    Report Status PENDING  Incomplete  Culture, blood (Routine X 2) w Reflex to ID Panel     Status: None (Preliminary result)   Collection Time: 06/06/23  9:05 AM   Specimen: BLOOD  Result Value Ref Range Status   Specimen Description BLOOD SITE NOT SPECIFIED  Final   Special Requests   Final    BOTTLES DRAWN AEROBIC AND ANAEROBIC Blood Culture adequate volume   Culture   Final    NO GROWTH 2 DAYS Performed at [facility], [address] [place], [place] [address]    Report Status PENDING  Incomplete  Respiratory (~20 pathogens) panel by PCR     Status: None   Collection Time: 06/06/23 12:37 PM   Specimen: Nasopharyngeal Swab; Respiratory  Result Value Ref Range Status   Adenovirus NOT DETECTED NOT DETECTED Final   Coronavirus 229E NOT DETECTED NOT DETECTED Final    Comment: (NOTE) The Coronavirus on the Respiratory Panel, DOES NOT test for the novel  Coronavirus (2019 nCoV)    Coronavirus HKU1 NOT DETECTED NOT DETECTED Final   Coronavirus NL63 NOT DETECTED NOT DETECTED Final   Coronavirus OC43 NOT DETECTED NOT DETECTED Final   Metapneumovirus NOT DETECTED NOT DETECTED Final   Rhinovirus / Enterovirus NOT DETECTED NOT DETECTED Final   Influenza A NOT DETECTED NOT  DETECTED Final   Influenza B NOT DETECTED NOT DETECTED Final   Parainfluenza Virus 1 NOT DETECTED NOT DETECTED Final   Parainfluenza Virus 2 NOT DETECTED NOT DETECTED Final   Parainfluenza Virus 3 NOT DETECTED NOT DETECTED Final   Parainfluenza Virus 4 NOT DETECTED NOT DETECTED Final   Respiratory Syncytial Virus NOT DETECTED NOT DETECTED Final   Bordetella pertussis NOT DETECTED NOT DETECTED Final   Bordetella Parapertussis NOT DETECTED NOT DETECTED Final   Chlamydophila pneumoniae NOT DETECTED NOT DETECTED Final   Mycoplasma pneumoniae NOT DETECTED NOT DETECTED Final    Comment: Performed at [facility], [address] [place], [place] [address]  MRSA Next Gen by PCR, Nasal     Status: None   Collection Time: 06/06/23 10:27 PM   Specimen: Nasal Mucosa; Nasal Swab  Result Value Ref Range Status   MRSA by PCR Next Gen NOT DETECTED NOT DETECTED Final    Comment: (NOTE) The GeneXpert MRSA Assay (FDA approved for NASAL specimens only), is one component of a comprehensive MRSA colonization surveillance program. It is not intended to diagnose MRSA infection nor to guide or monitor treatment for MRSA infections. Test performance is not FDA approved in patients less than 93 years old. Performed at [facility], [address], [place], [place] [address]          Radiology Studies: CT Angio Chest Pulmonary Embolism (PE) W or WO Contrast  Result Date: 06/06/2023 CLINICAL DATA:  Pulmonary embolism (PE) suspected, high prob Hypoxic respiratory failure and elevated D-dimer EXAM: CT ANGIOGRAPHY CHEST WITH CONTRAST TECHNIQUE: Multidetector CT imaging of the chest was performed using the standard protocol during bolus administration of intravenous  contrast. Multiplanar CT image reconstructions and MIPs were obtained to evaluate the vascular anatomy. RADIATION DOSE REDUCTION: This exam was performed according to the departmental dose-optimization program which includes automated  exposure control, adjustment of the mA and/or kV according to patient size and/or use of iterative reconstruction technique. CONTRAST:  75mL OMNIPAQUE IOHEXOL 350 MG/ML SOLN COMPARISON:  10/19/2018 FINDINGS: Cardiovascular: Heart is normal size. Aorta is normal caliber. Coronary artery and aortic atherosclerosis. No filling defects in the pulmonary arteries to suggest pulmonary emboli. Mediastinum/Nodes: No mediastinal, hilar, or axillary adenopathy. Trachea and esophagus are unremarkable. Thyroid unremarkable. Lungs/Pleura: Bilateral airspace disease throughout much of the right lung and in the left lower lung concerning for pneumonia. No effusions. Upper Abdomen: No acute findings Musculoskeletal: Chest wall soft tissues are unremarkable. No acute bony abnormality. Review of the MIP images confirms the above findings. IMPRESSION: No evidence of pulmonary embolus. Coronary artery disease. Bilateral airspace disease, right greater than left concerning for pneumonia. Aortic Atherosclerosis (ICD10-I70.0). Electronically Signed   By: Charlett Nose M.D.   On: 06/06/2023 20:40   US Abdomen Limited RUQ (LIVER/GB)  Result Date: 06/06/2023 CLINICAL DATA:  Elevated bilirubin EXAM: ULTRASOUND ABDOMEN LIMITED RIGHT UPPER QUADRANT COMPARISON:  CT 06/06/2023 FINDINGS: Gallbladder: Multiple gallstones within the gallbladder measuring up to 1.3 cm. Gallbladder wall is mildly thickened at 4 mm. Negative sonographic Murphy's. Common bile duct: Diameter: Normal caliber, 4 mm Liver: No focal lesion identified. Within normal limits in parenchymal echogenicity. Portal vein is patent on color Doppler imaging with normal direction of blood flow towards the liver. Other: None. IMPRESSION: Cholelithiasis. Slight gallbladder wall thickening without sonographic Murphy sign. Recommend clinical correlation to exclude acute cholecystitis. Electronically Signed   By: Charlett Nose M.D.   On: 06/06/2023 20:18        Scheduled Meds:   amLODipine  10 mg Oral Daily   atorvastatin  20 mg Oral Daily   Chlorhexidine Gluconate Cloth  6 each Topical Q0600   chlorthalidone  25 mg Oral Daily   DULoxetine  60 mg Oral Daily   enoxaparin (LOVENOX) injection  60 mg Subcutaneous Q24H   ezetimibe  10 mg Oral Daily   insulin aspart  0-15 Units Subcutaneous TID WC   insulin aspart  0-5 Units Subcutaneous QHS   loratadine  10 mg Oral Daily   metoprolol tartrate  50 mg Oral Daily   [START ON 06/09/2023] pneumococcal 20-valent conjugate vaccine  0.5 mL Intramuscular Tomorrow-1000   sodium chloride flush  3 mL Intravenous Q12H   tiZANidine  4 mg Oral Q6H   Continuous Infusions:  cefTRIAXone (ROCEPHIN)  IV Stopped (06/07/23 1310)     LOS: 2 days    Time spent: 45 minutes spent on chart review, discussion with nursing staff, consultants, updating family and interview/physical exam; more than 50% of that time was spent in counseling and/or coordination of care.    Joseph Art, DO [facility] Available via Epic secure chat 7am-7pm After these hours, please refer to coverage provider listed on [email or web address] 06/08/2023, 12:39 PM

## 2023-06-09 ENCOUNTER — Ambulatory Visit: Payer: Medicare HMO | Admitting: Nurse Practitioner

## 2023-06-09 DIAGNOSIS — N12 Tubulo-interstitial nephritis, not specified as acute or chronic: Secondary | ICD-10-CM | POA: Diagnosis not present

## 2023-06-09 LAB — CBC
HCT: 34.5 % — ABNORMAL LOW (ref 36.0–46.0)
Hemoglobin: 11.9 g/dL — ABNORMAL LOW (ref 12.0–15.0)
MCH: 29.4 pg (ref 26.0–34.0)
MCHC: 34.5 g/dL (ref 30.0–36.0)
MCV: 85.2 fL (ref 80.0–100.0)
Platelets: 219 10*3/uL (ref 150–400)
RBC: 4.05 MIL/uL (ref 3.87–5.11)
RDW: 14.6 % (ref 11.5–15.5)
WBC: 7.9 10*3/uL (ref 4.0–10.5)
nRBC: 0 % (ref 0.0–0.2)

## 2023-06-09 LAB — BASIC METABOLIC PANEL
Anion gap: 12 (ref 5–15)
BUN: 19 mg/dL (ref 6–20)
CO2: 24 mmol/L (ref 22–32)
Calcium: 9 mg/dL (ref 8.9–10.3)
Chloride: 98 mmol/L (ref 98–111)
Creatinine, Ser: 0.91 mg/dL (ref 0.44–1.00)
GFR, Estimated: >60 mL/min (ref >60)
Glucose, Bld: 115 mg/dL — ABNORMAL HIGH (ref 70–99)
Potassium: 3.3 mmol/L — ABNORMAL LOW (ref 3.5–5.1)
Sodium: 134 mmol/L — ABNORMAL LOW (ref 135–145)

## 2023-06-09 LAB — GLUCOSE, CAPILLARY
Glucose-Capillary: 152 mg/dL — ABNORMAL HIGH (ref 70–99)
Glucose-Capillary: 137 mg/dL — ABNORMAL HIGH (ref 70–99)
Glucose-Capillary: 166 mg/dL — ABNORMAL HIGH (ref 70–99)
Glucose-Capillary: 117 mg/dL — ABNORMAL HIGH (ref 70–99)

## 2023-06-09 MED ORDER — CEFADROXIL 500 MG PO CAPS
1000.0000 mg | ORAL_CAPSULE | Freq: Two times a day (BID) | ORAL | Status: DC
  Administered 2023-06-10: 1000 mg via ORAL
  Filled 2023-06-09: qty 2

## 2023-06-09 MED ORDER — POTASSIUM CHLORIDE CRYS ER 20 MEQ PO TBCR
40.0000 meq | EXTENDED_RELEASE_TABLET | Freq: Once | ORAL | Status: AC
  Administered 2023-06-09: 40 meq via ORAL
  Filled 2023-06-09: qty 2

## 2023-06-09 NOTE — Progress Notes (Signed)
PROGRESS NOTE    Shelby Robertson  ACZ:[id number] DOB: 27-Jan-1971 DOA: 06/05/2023 PCP: Arnette Felts, FNP    Brief Narrative:  Shelby Robertson is a 52 y.o. female with a known history of DM, fibromyalgia, HTN, OA, RA presents to the emergency department for evaluation of flank pain.  Patient was in a usual state of health until 2 days ago when she developed lower left sided abdominal pain radiating around to her flank associated with hematuria.  At this time complains only of left lower quadrant abdominal pain.  She denies fevers, dysuria, urinary frequency.   Upon arrival to the floor patient experienced shortness of breath associate with hypoxia at 86% on room air and tachycardia to 147.     Assessment and Plan: Acute Hypoxemic Respiratory Failure  Atelectasis  In the setting of untreated OSA, and chronic right hemidiaphragm paralysis, etiology for the airspace disease seen on CTA PE protocol is most likely atelectasis and less likely to be a pneumonia. Improved oxygen requirement at this time, currenly on 4 L nasal cannula.  - De-escalate antibiotics to ceftriaxone  - Fever control with Tylenol  - Encourage IS use  -encourage CPAP uses Home o2 study   Sepsis due to Pyelonephritis  Urine culture positive for E.coli, susceptibilities pending.  - De-escalate antibiotics to ceftriaxone  - Bcx NGTD   AKI  Previously received IV fluids. End organ damage from sepsis.    Elevated Bilirubin  Sludge seen on ultrasound. No concern for cholecystitis as asymptomatic.  - Continue to monitor    Hypertension  - Continue amlodipine, chlorthalidone, metoprolol    Prediabetes  - Sliding scale insulin    Hyperlipidemia  - Continue statin   Obesity Estimated body mass index is 47.5 kg/m as calculated from the following:   Height as of this encounter: 5\' 6"  (1.676 m).   Weight as of this encounter: 133.5 kg.   Hypokalemia -replete   DVT prophylaxis: SCDs Start: 06/06/23 0911     Code Status: Full Code   Disposition Plan:  Level of care: Med-Surg Status is: Inpatient Remains inpatient appropriate    Consultants:  PCCM   Subjective: Overall feeling better  Objective: Vitals:   06/08/23 1627 06/09/23 0000 06/09/23 0419 06/09/23 0855  BP: 97/74  (!) 149/89 118/72  Pulse: (!) 111 (!) 106 (!) 118 (!) 108  Resp: 18 (!) 22 18 18   Temp: 97.7 F (36.5 C)  98.6 F (37 C) 98.9 F (37.2 C)  TempSrc: Oral     SpO2: (!) 75% 92%  (!) 72%  Weight:      Height:        Intake/Output Summary (Last 24 hours) at 06/09/2023 1227 Last data filed at 06/09/2023 1000 Gross per 24 hour  Intake 103 ml  Output --  Net 103 ml   Filed Weights   06/05/23 2050 06/06/23 2101  Weight: 134.3 kg 133.5 kg    Examination:   General: Appearance:    Severely obese female in no acute distress     Lungs:     Off O2, respirations unlabored  Heart:    Tachycardic.    MS:   All extremities are intact.    Neurologic:   Awake, alert       Data Reviewed: I have personally reviewed following labs and imaging studies  CBC: Recent Labs  Lab 06/06/23 0306 06/06/23 0550 06/07/23 0115 06/08/23 0302 06/09/23 0132  WBC 18.1*  --  12.1* 7.9 7.9  HGB 15.1* 10.2* 11.9* 11.5*  11.9*  HCT 45.9 30.0* 35.9* 33.8* 34.5*  MCV 89.5  --  88.2 87.3 85.2  PLT 268  --  178 192 219   Basic Metabolic Panel: Recent Labs  Lab 06/06/23 0550 06/06/23 0905 06/06/23 1102 06/07/23 0445 06/08/23 0302 06/09/23 0132  NA 140 135  --  131* 132* 134*  K 4.2 3.4*  --  3.6 3.2* 3.3*  CL 115* 102  --  99 99 98  CO2  --  21*  --  22 24 24   GLUCOSE 90 167*  --  131* 128* 115*  BUN 11 10  --  16 14 19   CREATININE 0.70 1.19*  --  1.13* 1.06* 0.91  CALCIUM  --  8.3*  --  8.6* 8.8* 9.0  MG  --   --  1.5* 2.1 1.9  --   PHOS  --   --  2.6 2.5 2.8  --    GFR: Estimated Creatinine Clearance: 103.9 mL/min (by C-G formula based on SCr of 0.91 mg/dL). Liver Function Tests: Recent Labs  Lab  06/06/23 0905  AST 15  ALT 17  ALKPHOS 62  BILITOT 1.7*  PROT 7.4  ALBUMIN 3.1*   Recent Labs  Lab 06/06/23 0905  LIPASE 20   No results for input(s): "AMMONIA" in the last 168 hours. Coagulation Profile: Recent Labs  Lab 06/06/23 1102  INR 1.3*   Cardiac Enzymes: No results for input(s): "CKTOTAL", "CKMB", "CKMBINDEX", "TROPONINI" in the last 168 hours. BNP (last 3 results) No results for input(s): "PROBNP" in the last 8760 hours. HbA1C: No results for input(s): "HGBA1C" in the last 72 hours. CBG: Recent Labs  Lab 06/08/23 1246 06/08/23 1629 06/08/23 2105 06/09/23 0746 06/09/23 1224  GLUCAP 152* 149* 149* 152* 137*   Lipid Profile: No results for input(s): "CHOL", "HDL", "LDLCALC", "TRIG", "CHOLHDL", "LDLDIRECT" in the last 72 hours. Thyroid Function Tests: No results for input(s): "TSH", "T4TOTAL", "FREET4", "T3FREE", "THYROIDAB" in the last 72 hours. Anemia Panel: No results for input(s): "VITAMINB12", "FOLATE", "FERRITIN", "TIBC", "IRON", "RETICCTPCT" in the last 72 hours. Sepsis Labs: Recent Labs  Lab 06/06/23 0905 06/06/23 1102 06/06/23 1306  LATICACIDVEN 1.6 1.1 1.2    Recent Results (from the past 240 hour(s))  Urine Culture     Status: Abnormal   Collection Time: 06/05/23  9:10 PM   Specimen: Urine, Random  Result Value Ref Range Status   Specimen Description URINE, RANDOM  Final   Special Requests   Final    NONE Reflexed from [id number] Performed at [facility], [address], [place], [place] [address]    Culture >=100,000 COLONIES/mL ESCHERICHIA COLI (A)  Final   Report Status 06/08/2023 FINAL  Final   Organism ID, Bacteria ESCHERICHIA COLI (A)  Final      Susceptibility   Escherichia coli - MIC*    AMPICILLIN <=2 SENSITIVE Sensitive     CEFAZOLIN <=4 SENSITIVE Sensitive     CEFEPIME <=0.12 SENSITIVE Sensitive     CEFTRIAXONE <=0.25 SENSITIVE Sensitive     CIPROFLOXACIN <=0.25 SENSITIVE Sensitive     GENTAMICIN <=1 SENSITIVE  Sensitive     IMIPENEM <=0.25 SENSITIVE Sensitive     NITROFURANTOIN <=16 SENSITIVE Sensitive     TRIMETH/SULFA <=20 SENSITIVE Sensitive     AMPICILLIN/SULBACTAM <=2 SENSITIVE Sensitive     PIP/TAZO <=4 SENSITIVE Sensitive     * >=100,000 COLONIES/mL ESCHERICHIA COLI  Culture, blood (Routine X 2) w Reflex to ID Panel     Status: None (  Preliminary result)   Collection Time: 06/06/23  9:05 AM   Specimen: BLOOD  Result Value Ref Range Status   Specimen Description BLOOD SITE NOT SPECIFIED  Final   Special Requests   Final    BOTTLES DRAWN AEROBIC AND ANAEROBIC Blood Culture adequate volume   Culture   Final    NO GROWTH 3 DAYS Performed at [facility], [address], [place], [place] [address]    Report Status PENDING  Incomplete  Culture, blood (Routine X 2) w Reflex to ID Panel     Status: None (Preliminary result)   Collection Time: 06/06/23  9:05 AM   Specimen: BLOOD  Result Value Ref Range Status   Specimen Description BLOOD SITE NOT SPECIFIED  Final   Special Requests   Final    BOTTLES DRAWN AEROBIC AND ANAEROBIC Blood Culture adequate volume   Culture   Final    NO GROWTH 3 DAYS Performed at [facility], [address] [place], [place] [address]    Report Status PENDING  Incomplete  Respiratory (~20 pathogens) panel by PCR     Status: None   Collection Time: 06/06/23 12:37 PM   Specimen: Nasopharyngeal Swab; Respiratory  Result Value Ref Range Status   Adenovirus NOT DETECTED NOT DETECTED Final   Coronavirus 229E NOT DETECTED NOT DETECTED Final    Comment: (NOTE) The Coronavirus on the Respiratory Panel, DOES NOT test for the novel  Coronavirus (2019 nCoV)    Coronavirus HKU1 NOT DETECTED NOT DETECTED Final   Coronavirus NL63 NOT DETECTED NOT DETECTED Final   Coronavirus OC43 NOT DETECTED NOT DETECTED Final   Metapneumovirus NOT DETECTED NOT DETECTED Final   Rhinovirus / Enterovirus NOT DETECTED NOT DETECTED Final   Influenza A NOT DETECTED NOT  DETECTED Final   Influenza B NOT DETECTED NOT DETECTED Final   Parainfluenza Virus 1 NOT DETECTED NOT DETECTED Final   Parainfluenza Virus 2 NOT DETECTED NOT DETECTED Final   Parainfluenza Virus 3 NOT DETECTED NOT DETECTED Final   Parainfluenza Virus 4 NOT DETECTED NOT DETECTED Final   Respiratory Syncytial Virus NOT DETECTED NOT DETECTED Final   Bordetella pertussis NOT DETECTED NOT DETECTED Final   Bordetella Parapertussis NOT DETECTED NOT DETECTED Final   Chlamydophila pneumoniae NOT DETECTED NOT DETECTED Final   Mycoplasma pneumoniae NOT DETECTED NOT DETECTED Final    Comment: Performed at [facility], [address], [place], [place] [address]  MRSA Next Gen by PCR, Nasal     Status: None   Collection Time: 06/06/23 10:27 PM   Specimen: Nasal Mucosa; Nasal Swab  Result Value Ref Range Status   MRSA by PCR Next Gen NOT DETECTED NOT DETECTED Final    Comment: (NOTE) The GeneXpert MRSA Assay (FDA approved for NASAL specimens only), is one component of a comprehensive MRSA colonization surveillance program. It is not intended to diagnose MRSA infection nor to guide or monitor treatment for MRSA infections. Test performance is not FDA approved in patients less than 14 years old. Performed at [facility], [address] [place], [place] [address]          Radiology Studies: No results found.      Scheduled Meds:  amLODipine  10 mg Oral Daily   atorvastatin  20 mg Oral Daily   Chlorhexidine Gluconate Cloth  6 each Topical Q0600   chlorthalidone  25 mg Oral Daily   DULoxetine  60 mg Oral Daily   enoxaparin (LOVENOX)  injection  60 mg Subcutaneous Q24H   ezetimibe  10 mg Oral Daily   insulin aspart  0-15 Units Subcutaneous TID WC   insulin aspart  0-5 Units Subcutaneous QHS   loratadine  10 mg Oral Daily   metoprolol tartrate  50 mg Oral Daily   sodium chloride flush  3 mL Intravenous Q12H   tiZANidine  4 mg Oral Q6H   Continuous Infusions:   cefTRIAXone (ROCEPHIN)  IV 2 g (06/09/23 0812)     LOS: 3 days    Time spent: 45 minutes spent on chart review, discussion with nursing staff, consultants, updating family and interview/physical exam; more than 50% of that time was spent in counseling and/or coordination of care.    Joseph Art, DO [facility] Available via Epic secure chat 7am-7pm After these hours, please refer to coverage provider listed on [email or web address] 06/09/2023, 12:27 PM

## 2023-06-09 NOTE — Care Management Important Message (Signed)
Important Message  Patient Details  Name: Shelby Robertson MRN: [id number] Date of Birth: December 25, 1970   Medicare Important Message Given:  Yes     Aureliano Oshields Stefan Church 06/09/2023, 2:49 PM

## 2023-06-09 NOTE — Progress Notes (Signed)
Pt has no known hx of incontinence in h&p. Pt stated incontinence has been going on for while (didn't give specific time frame). Pt had bed changed 3x today, 2x on day and 1x on night. Despite commode at bedside and pt being ambulatory, pt still incontinent. Pt stated that sometimes she can't feel when she has to go and when she does feel the urge to go, by then it's too late. Pt has been incontinent of urine since she came on the unit according to pt and dayshift nurse, Fleet Contras. Due to there possibly being some other underlying issue, this nurse notified Dr. Antionette Char per above. Pt requested purewick, as did NT. Pure wick order in and placed on pt. Dr. Algis Downs will pass per above to attending in the morning.

## 2023-06-09 NOTE — Plan of Care (Signed)

## 2023-06-09 NOTE — Evaluation (Signed)
Physical Therapy Evaluation Patient Details Name: Shelby Robertson MRN: [id number] DOB: 1971-10-16 Today's Date: 06/09/2023  History of Present Illness  Pt is 52 yo female who presented on 06/06/23 with flank pain, has been incontinent while in hospital. Also with hypoxia and tachycardia. Pt with untreated OSA now with acute hypoxemic respiratory failure and sepsis due to pyelonephritis. PMH:  DM, fibromyalgia, HTN, OA, RA, pre DM  Clinical Impression  Pt doing well with mobility and no further PT needed.  Ready for dc from PT standpoint.         If plan is discharge home, recommend the following:     Can travel by private vehicle        Equipment Recommendations None recommended by PT  Recommendations for Other Services       Functional Status Assessment Patient has not had a recent decline in their functional status     Precautions / Restrictions        Mobility  Bed Mobility Overal bed mobility: Modified Independent                  Transfers Overall transfer level: Modified independent Equipment used: None                    Ambulation/Gait Ambulation/Gait assistance: Supervision, Modified independent (Device/Increase time) Gait Distance (Feet): 250 Feet Assistive device: None Gait Pattern/deviations: Decreased stride length Gait velocity: decr Gait velocity interpretation: 1.31 - 2.62 ft/sec, indicative of limited community ambulator   General Gait Details: Steady gait  Stairs            Wheelchair Mobility     Tilt Bed    Modified Rankin (Stroke Patients Only)       Balance Overall balance assessment: Mild deficits observed, not formally tested                                           Pertinent Vitals/Pain Pain Assessment Pain Assessment: No/denies pain    Home Living Family/patient expects to be discharged to:: Private residence Living Arrangements: Parent Available Help at Discharge: Family Type of  Home: Apartment Home Access: Level entry       Home Layout: Two level;Bed/bath upstairs;1/2 bath on main level Home Equipment: Cane - single point      Prior Function Prior Level of Function : Independent/Modified Independent             Mobility Comments: Occasionally uses cane       Extremity/Trunk Assessment   Upper Extremity Assessment Upper Extremity Assessment: Overall WFL for tasks assessed    Lower Extremity Assessment Lower Extremity Assessment: Overall WFL for tasks assessed       Communication   Communication Communication: No apparent difficulties  Cognition Arousal: Alert Behavior During Therapy: WFL for tasks assessed/performed Overall Cognitive Status: Within Functional Limits for tasks assessed                                          General Comments General comments (skin integrity, edema, etc.): SpO2 92-94% on RA at rest and with activity    Exercises     Assessment/Plan    PT Assessment Patient does not need any further PT services  PT Problem List  PT Treatment Interventions      PT Goals (Current goals can be found in the Care Plan section)  Acute Rehab PT Goals PT Goal Formulation: All assessment and education complete, DC therapy    Frequency       Co-evaluation               AM-PAC PT "6 Clicks" Mobility  Outcome Measure Help needed turning from your back to your side while in a flat bed without using bedrails?: None Help needed moving from lying on your back to sitting on the side of a flat bed without using bedrails?: None Help needed moving to and from a bed to a chair (including a wheelchair)?: None Help needed standing up from a chair using your arms (e.g., wheelchair or bedside chair)?: None Help needed to walk in hospital room?: None Help needed climbing 3-5 steps with a railing? : A Little 6 Click Score: 23    End of Session   Activity Tolerance: Patient tolerated treatment  well Patient left: in chair;with call bell/phone within reach Nurse Communication: Mobility status (Left purewick out) PT Visit Diagnosis: Other abnormalities of gait and mobility (R26.89)    Time: 5621-3086 PT Time Calculation (min) (ACUTE ONLY): 19 min   Charges:   PT Evaluation $PT Eval Low Complexity: 1 Low   PT General Charges $$ ACUTE PT VISIT: 1 Visit         [facility] PT Acute Rehabilitation Services Office [phone]   Angelina Ok [facility] 06/09/2023, 5:37 PM

## 2023-06-09 NOTE — Care Management Important Message (Signed)
Important Message  Patient Details  Name: Shelby Robertson MRN: [id number] Date of Birth: [birth year]   Medicare Important Message Given:  Yes     Curby Carswell 06/09/2023, 3:20 PM

## 2023-06-09 NOTE — Progress Notes (Signed)
SATURATION QUALIFICATIONS: (This note is used to comply with regulatory documentation for home oxygen)  Patient Saturations on Room Air at Rest = 94%  Patient Saturations on Room Air while Ambulating = 92%  Patient Saturations on NA Liters of oxygen while Ambulating = NA%  Please briefly explain why patient needs home oxygen:Did not require oxygen  [facility] PT Acute Rehabilitation Services Office [phone]

## 2023-06-09 NOTE — Progress Notes (Signed)
   06/09/23 2322  BiPAP/CPAP/SIPAP  Reason BIPAP/CPAP not in use Non-compliant   Patient refused use of CPAP for the evening.

## 2023-06-10 DIAGNOSIS — N12 Tubulo-interstitial nephritis, not specified as acute or chronic: Secondary | ICD-10-CM | POA: Diagnosis not present

## 2023-06-10 LAB — GLUCOSE, CAPILLARY
Glucose-Capillary: 153 mg/dL — ABNORMAL HIGH (ref 70–99)
Glucose-Capillary: 179 mg/dL — ABNORMAL HIGH (ref 70–99)

## 2023-06-10 MED ORDER — CEFADROXIL 500 MG PO CAPS: 1000.0000 mg | ORAL_CAPSULE | Freq: Two times a day (BID) | ORAL | 0 refills | Status: AC

## 2023-06-10 MED ORDER — METOPROLOL TARTRATE 25 MG PO TABS: 25.0000 mg | ORAL_TABLET | Freq: Every day | ORAL | 1 refills | Status: AC

## 2023-06-10 MED ORDER — METOPROLOL TARTRATE 25 MG PO TABS
25.0000 mg | ORAL_TABLET | Freq: Every day | ORAL | Status: DC
  Administered 2023-06-10: 25 mg via ORAL
  Filled 2023-06-10: qty 1

## 2023-06-10 NOTE — Discharge Summary (Signed)
Physician Discharge Summary  Shelby Robertson ZOX:[id number] DOB: 1971/03/15 DOA: 06/05/2023  PCP: Arnette Felts, FNP  Admit date: 06/05/2023 Discharge date: 06/10/2023  Admitted From: home Discharge disposition: home   Recommendations for Outpatient Follow-Up:   Needs to wear CPAP BP meds adjusted for lower BP-- ? Compliance at home BMP at next office visit   Discharge Diagnosis:   Principal Problem:   Pyelonephritis Active Problems:   Sepsis (HCC)   Sepsis secondary to UTI [facility])    Discharge Condition: Improved.  Diet recommendation: Low sodium, heart healthy.  Carbohydrate-modified  Wound care: None.  Code status: Full.   History of Present Illness:    Shelby Robertson is a 52 y.o. female with a known history of DM, fibromyalgia, HTN, OA, RA presents to the emergency department for evaluation of flank pain.  Patient was in a usual state of health until 2 days ago when she developed lower left sided abdominal pain radiating around to her flank associated with hematuria.  At this time complains only of left lower quadrant abdominal pain.  She denies fevers, dysuria, urinary frequency.   Upon arrival to the floor patient experienced shortness of breath associate with hypoxia at 86% on room air and tachycardia to 147.   Patient denies fevers/chills, weakness, dizziness, chest pain, shortness of breath, N/V/C/D, dysuria/frequency, changes in mental status.    Otherwise there has been no change in status. Patient has been taking medication as prescribed and there has been no recent change in medication or diet.  No recent antibiotics.  There has been no recent illness, hospitalizations, travel or sick contacts.     Hospital Course by Problem:   Acute Hypoxemic Respiratory Failure  Atelectasis  In the setting of untreated OSA, and chronic right hemidiaphragm paralysis, etiology for the airspace disease seen on CTA PE protocol is most likely atelectasis and less  likely to be a pneumonia. Improved oxygen requirement at this time, currenly on 4 L nasal cannula.  - Encourage IS use  -encourage CPAP use at home Home o2 study negative for need for O2 -smoking cessation   Sepsis due to Pyelonephritis  Urine culture positive for E.coli, susceptibilities pending.  - De-escalate antibiotics to PO to finish course - Bcx NGTD   AKI  -resolved   Elevated Bilirubin  Sludge seen on ultrasound. No concern for cholecystitis as asymptomatic.  - Continue to monitor outpatient   Hypertension  - meds adjusted-- ? Compliance at home   Prediabetes  - resume home meds   Hyperlipidemia  - Continue statin    Obesity Estimated body mass index is 47.5 kg/m as calculated from the following:   Height as of this encounter: 5\' 6"  (1.676 m).   Weight as of this encounter: 133.5 kg.    Hypokalemia -replete    Medical Consultants:   PCCM   Discharge Exam:   Vitals:   06/10/23 0420 06/10/23 0724  BP: (!) 129/90 103/71  Pulse: (!) 115 (!) 102  Resp: 18 16  Temp: 97.6 F (36.4 C) 97.9 F (36.6 C)  SpO2: 94% 93%   Vitals:   06/09/23 1611 06/09/23 1935 06/10/23 0420 06/10/23 0724  BP: 102/70 109/75 (!) 129/90 103/71  Pulse: (!) 107 (!) 103 (!) 115 (!) 102  Resp: 18 18 18 16   Temp: 98.3 F (36.8 C) (!) 97.5 F (36.4 C) 97.6 F (36.4 C) 97.9 F (36.6 C)  TempSrc: Oral Oral Oral Oral  SpO2: (!) 89% 92% 94% 93%  Weight:      Height:        General exam: Appears calm and comfortable.    The results of significant diagnostics from this hospitalization (including imaging, microbiology, ancillary and laboratory) are listed below for reference.     Procedures and Diagnostic Studies:   CT Angio Chest Pulmonary Embolism (PE) W or WO Contrast  Result Date: 06/06/2023 CLINICAL DATA:  Pulmonary embolism (PE) suspected, high prob Hypoxic respiratory failure and elevated D-dimer EXAM: CT ANGIOGRAPHY CHEST WITH CONTRAST TECHNIQUE: Multidetector CT  imaging of the chest was performed using the standard protocol during bolus administration of intravenous contrast. Multiplanar CT image reconstructions and MIPs were obtained to evaluate the vascular anatomy. RADIATION DOSE REDUCTION: This exam was performed according to the departmental dose-optimization program which includes automated exposure control, adjustment of the mA and/or kV according to patient size and/or use of iterative reconstruction technique. CONTRAST:  75mL OMNIPAQUE IOHEXOL 350 MG/ML SOLN COMPARISON:  10/19/2018 FINDINGS: Cardiovascular: Heart is normal size. Aorta is normal caliber. Coronary artery and aortic atherosclerosis. No filling defects in the pulmonary arteries to suggest pulmonary emboli. Mediastinum/Nodes: No mediastinal, hilar, or axillary adenopathy. Trachea and esophagus are unremarkable. Thyroid unremarkable. Lungs/Pleura: Bilateral airspace disease throughout much of the right lung and in the left lower lung concerning for pneumonia. No effusions. Upper Abdomen: No acute findings Musculoskeletal: Chest wall soft tissues are unremarkable. No acute bony abnormality. Review of the MIP images confirms the above findings. IMPRESSION: No evidence of pulmonary embolus. Coronary artery disease. Bilateral airspace disease, right greater than left concerning for pneumonia. Aortic Atherosclerosis (ICD10-I70.0). Electronically Signed   By: Charlett Nose M.D.   On: 06/06/2023 20:40   US Abdomen Limited RUQ (LIVER/GB)  Result Date: 06/06/2023 CLINICAL DATA:  Elevated bilirubin EXAM: ULTRASOUND ABDOMEN LIMITED RIGHT UPPER QUADRANT COMPARISON:  CT 06/06/2023 FINDINGS: Gallbladder: Multiple gallstones within the gallbladder measuring up to 1.3 cm. Gallbladder wall is mildly thickened at 4 mm. Negative sonographic Murphy's. Common bile duct: Diameter: Normal caliber, 4 mm Liver: No focal lesion identified. Within normal limits in parenchymal echogenicity. Portal vein is patent on color Doppler  imaging with normal direction of blood flow towards the liver. Other: None. IMPRESSION: Cholelithiasis. Slight gallbladder wall thickening without sonographic Murphy sign. Recommend clinical correlation to exclude acute cholecystitis. Electronically Signed   By: Charlett Nose M.D.   On: 06/06/2023 20:18   DG Chest 1 View  Result Date: 06/06/2023 CLINICAL DATA:  Hypoxia EXAM: CHEST  1 VIEW COMPARISON:  11/26/2021 FINDINGS: Stable heart size. Chronic elevation of the right hemidiaphragm. Mild interstitial prominence bilaterally with streaky airspace opacity throughout the right lung. No appreciable pleural fluid collection. No pneumothorax. Scoliotic thoracic curvature. IMPRESSION: Mild interstitial prominence bilaterally with streaky airspace opacity throughout the right lung, which may represent atelectasis, edema, or infection. Electronically Signed   By: Duanne Guess D.O.   On: 06/06/2023 11:34   CT ABDOMEN PELVIS W CONTRAST  Result Date: 06/06/2023 CLINICAL DATA:  52 year old female with abdominal and flank pain. Suspected kidney stone or pyelonephritis. EXAM: CT ABDOMEN AND PELVIS WITH CONTRAST TECHNIQUE: Multidetector CT imaging of the abdomen and pelvis was performed using the standard protocol following bolus administration of intravenous contrast. RADIATION DOSE REDUCTION: This exam was performed according to the departmental dose-optimization program which includes automated exposure control, adjustment of the mA and/or kV according to patient size and/or use of iterative reconstruction technique. CONTRAST:  75mL OMNIPAQUE IOHEXOL 350 MG/ML SOLN COMPARISON:  CT of the abdomen and pelvis 11/26/2021.  FINDINGS: Lower chest: Unremarkable. Hepatobiliary: No suspicious cystic or solid hepatic lesions. No intra or extrahepatic biliary ductal dilatation. Numerous partially cavitary gallstones are noted within the lumen of the gallbladder. Gallbladder appears moderately distended. No definite  pericholecystic fluid or surrounding inflammatory changes. Pancreas: No pancreatic mass. No pancreatic ductal dilatation. No pancreatic or peripancreatic fluid collections or inflammatory changes. Spleen: Unremarkable. Adrenals/Urinary Tract: Multiple nonobstructive calculi are noted in the collecting systems of both kidneys, largest of which measures up to 2.3 x 1.1 cm in the lower pole of the right kidney. No additional calculi are confidently identified along the course of either ureter or within the lumen of the urinary bladder. No hydroureteronephrosis. Perinephric stranding surrounding the left kidney. Multiple low-attenuation lesions in both kidneys, largest of which are compatible with simple cysts (measuring up to 5.2 cm in the interpolar region of the right kidney), while the smaller lesions are too small to definitively characterize, but also favored to represent cysts (no imaging follow-up recommended). In the interpolar region of the left kidney (axial image 42 series 3) there is also a relatively poorly defined low-intermediate attenuation (26 HU) region measuring up to 3.3 cm in diameter which enhances less than the surrounding renal parenchyma, concerning for pyelonephritis. Urinary bladder is unremarkable in appearance. Bilateral adrenal glands are normal in appearance. Stomach/Bowel: The appearance of the stomach is normal. No pathologic dilatation of small bowel or colon. The appendix is not confidently identified and may be surgically absent. Regardless, there are no inflammatory changes noted adjacent to the cecum to suggest the presence of an acute appendicitis at this time. Vascular/Lymphatic: Atherosclerotic calcifications are noted in the abdominal aorta and pelvic vasculature. No lymphadenopathy noted in the abdomen or pelvis. Reproductive: Uterus and ovaries are unremarkable in appearance. Other: No significant volume of ascites.  No pneumoperitoneum. Musculoskeletal: Sclerosis in the  anterior aspect of the T8 vertebral body similar to prior CT of the abdomen and pelvis 11/26/2021, of uncertain etiology and significance, but presumably benign. There are no aggressive appearing lytic or blastic lesions noted in the visualized portions of the skeleton. IMPRESSION: 1. Imaging findings, as above, concerning for probable left-sided pyelonephritis. Correlation with urinalysis is recommended. 2. Multiple nonobstructive calculi in the collecting systems of both kidneys. No ureteral stones or findings suggestive of urinary tract infection at this time. 3. Aortic atherosclerosis. 4. Additional incidental findings, as above. Electronically Signed   By: Trudie Reed M.D.   On: 06/06/2023 06:43     Labs:   Basic Metabolic Panel: Recent Labs  Lab 06/06/23 0550 06/06/23 0905 06/06/23 1102 06/07/23 0445 06/08/23 0302 06/09/23 0132  NA 140 135  --  131* 132* 134*  K 4.2 3.4*  --  3.6 3.2* 3.3*  CL 115* 102  --  99 99 98  CO2  --  21*  --  22 24 24   GLUCOSE 90 167*  --  131* 128* 115*  BUN 11 10  --  16 14 19   CREATININE 0.70 1.19*  --  1.13* 1.06* 0.91  CALCIUM  --  8.3*  --  8.6* 8.8* 9.0  MG  --   --  1.5* 2.1 1.9  --   PHOS  --   --  2.6 2.5 2.8  --    GFR Estimated Creatinine Clearance: 103.9 mL/min (by C-G formula based on SCr of 0.91 mg/dL). Liver Function Tests: Recent Labs  Lab 06/06/23 0905  AST 15  ALT 17  ALKPHOS 62  BILITOT 1.7*  PROT  7.4  ALBUMIN 3.1*   Recent Labs  Lab 06/06/23 0905  LIPASE 20   No results for input(s): "AMMONIA" in the last 168 hours. Coagulation profile Recent Labs  Lab 06/06/23 1102  INR 1.3*    CBC: Recent Labs  Lab 06/06/23 0306 06/06/23 0550 06/07/23 0115 06/08/23 0302 06/09/23 0132  WBC 18.1*  --  12.1* 7.9 7.9  HGB 15.1* 10.2* 11.9* 11.5* 11.9*  HCT 45.9 30.0* 35.9* 33.8* 34.5*  MCV 89.5  --  88.2 87.3 85.2  PLT 268  --  178 192 219   Cardiac Enzymes: No results for input(s): "CKTOTAL", "CKMB",  "CKMBINDEX", "TROPONINI" in the last 168 hours. BNP: Invalid input(s): "POCBNP" CBG: Recent Labs  Lab 06/09/23 0746 06/09/23 1224 06/09/23 1614 06/09/23 2125 06/10/23 0722  GLUCAP 152* 137* 166* 117* 153*   D-Dimer No results for input(s): "DDIMER" in the last 72 hours. Hgb A1c No results for input(s): "HGBA1C" in the last 72 hours. Lipid Profile No results for input(s): "CHOL", "HDL", "LDLCALC", "TRIG", "CHOLHDL", "LDLDIRECT" in the last 72 hours. Thyroid function studies No results for input(s): "TSH", "T4TOTAL", "T3FREE", "THYROIDAB" in the last 72 hours.  Invalid input(s): "FREET3" Anemia work up No results for input(s): "VITAMINB12", "FOLATE", "FERRITIN", "TIBC", "IRON", "RETICCTPCT" in the last 72 hours. Microbiology Recent Results (from the past 240 hour(s))  Urine Culture     Status: Abnormal   Collection Time: 06/05/23  9:10 PM   Specimen: Urine, Random  Result Value Ref Range Status   Specimen Description URINE, RANDOM  Final   Special Requests   Final    NONE Reflexed from [id number] Performed at [facility], [address] [place], [place] [address]    Culture >=100,000 COLONIES/mL ESCHERICHIA COLI (A)  Final   Report Status 06/08/2023 FINAL  Final   Organism ID, Bacteria ESCHERICHIA COLI (A)  Final      Susceptibility   Escherichia coli - MIC*    AMPICILLIN <=2 SENSITIVE Sensitive     CEFAZOLIN <=4 SENSITIVE Sensitive     CEFEPIME <=0.12 SENSITIVE Sensitive     CEFTRIAXONE <=0.25 SENSITIVE Sensitive     CIPROFLOXACIN <=0.25 SENSITIVE Sensitive     GENTAMICIN <=1 SENSITIVE Sensitive     IMIPENEM <=0.25 SENSITIVE Sensitive     NITROFURANTOIN <=16 SENSITIVE Sensitive     TRIMETH/SULFA <=20 SENSITIVE Sensitive     AMPICILLIN/SULBACTAM <=2 SENSITIVE Sensitive     PIP/TAZO <=4 SENSITIVE Sensitive     * >=100,000 COLONIES/mL ESCHERICHIA COLI  Culture, blood (Routine X 2) w Reflex to ID Panel     Status: None (Preliminary result)   Collection Time:  06/06/23  9:05 AM   Specimen: BLOOD  Result Value Ref Range Status   Specimen Description BLOOD SITE NOT SPECIFIED  Final   Special Requests   Final    BOTTLES DRAWN AEROBIC AND ANAEROBIC Blood Culture adequate volume   Culture   Final    NO GROWTH 4 DAYS Performed at [facility], [address], [place], [place] [address]    Report Status PENDING  Incomplete  Culture, blood (Routine X 2) w Reflex to ID Panel     Status: None (Preliminary result)   Collection Time: 06/06/23  9:05 AM   Specimen: BLOOD  Result Value Ref Range Status   Specimen Description BLOOD SITE NOT SPECIFIED  Final   Special Requests   Final    BOTTLES DRAWN AEROBIC AND ANAEROBIC Blood Culture adequate volume   Culture  Final    NO GROWTH 4 DAYS Performed at [facility], [address] [place], [place] [address]    Report Status PENDING  Incomplete  Respiratory (~20 pathogens) panel by PCR     Status: None   Collection Time: 06/06/23 12:37 PM   Specimen: Nasopharyngeal Swab; Respiratory  Result Value Ref Range Status   Adenovirus NOT DETECTED NOT DETECTED Final   Coronavirus 229E NOT DETECTED NOT DETECTED Final    Comment: (NOTE) The Coronavirus on the Respiratory Panel, DOES NOT test for the novel  Coronavirus (2019 nCoV)    Coronavirus HKU1 NOT DETECTED NOT DETECTED Final   Coronavirus NL63 NOT DETECTED NOT DETECTED Final   Coronavirus OC43 NOT DETECTED NOT DETECTED Final   Metapneumovirus NOT DETECTED NOT DETECTED Final   Rhinovirus / Enterovirus NOT DETECTED NOT DETECTED Final   Influenza A NOT DETECTED NOT DETECTED Final   Influenza B NOT DETECTED NOT DETECTED Final   Parainfluenza Virus 1 NOT DETECTED NOT DETECTED Final   Parainfluenza Virus 2 NOT DETECTED NOT DETECTED Final   Parainfluenza Virus 3 NOT DETECTED NOT DETECTED Final   Parainfluenza Virus 4 NOT DETECTED NOT DETECTED Final   Respiratory Syncytial Virus NOT DETECTED NOT DETECTED Final   Bordetella pertussis NOT DETECTED  NOT DETECTED Final   Bordetella Parapertussis NOT DETECTED NOT DETECTED Final   Chlamydophila pneumoniae NOT DETECTED NOT DETECTED Final   Mycoplasma pneumoniae NOT DETECTED NOT DETECTED Final    Comment: Performed at [facility], [address] [place], [place] [address]  MRSA Next Gen by PCR, Nasal     Status: None   Collection Time: 06/06/23 10:27 PM   Specimen: Nasal Mucosa; Nasal Swab  Result Value Ref Range Status   MRSA by PCR Next Gen NOT DETECTED NOT DETECTED Final    Comment: (NOTE) The GeneXpert MRSA Assay (FDA approved for NASAL specimens only), is one component of a comprehensive MRSA colonization surveillance program. It is not intended to diagnose MRSA infection nor to guide or monitor treatment for MRSA infections. Test performance is not FDA approved in patients less than 37 years old. Performed at [facility], [address], [place], [place] [address]      Discharge Instructions:   Discharge Instructions     Diet - low sodium heart healthy   Complete by: As directed    Diet Carb Modified   Complete by: As directed    Discharge instructions   Complete by: As directed    You need to wear your CPAP   Increase activity slowly   Complete by: As directed       Allergies as of 06/10/2023   No Known Allergies      Medication List     STOP taking these medications    amLODipine 10 MG tablet Commonly known as: NORVASC   Clobetasol Propionate 0.05 % shampoo   doxycycline 100 MG capsule Commonly known as: VIBRAMYCIN   Humira Pen 40 MG/0.8ML Pnkt pen Generic drug: adalimumab   hydrocortisone cream 1 %       TAKE these medications    acetaminophen 325 MG tablet Commonly known as: TYLENOL Take 2 tablets (650 mg total) by mouth every 6 (six) hours as needed for mild pain (or Fever >/= 101).   albuterol 108 (90 Base) MCG/ACT inhaler Commonly known as: VENTOLIN HFA Inhale 2 puffs into the lungs every 4 (four) hours as needed for  wheezing or shortness of breath (cough, shortness  of breath or wheezing.).   albuterol (2.5 MG/3ML) 0.083% nebulizer solution Commonly known as: PROVENTIL Take 3 mLs (2.5 mg total) by nebulization every 6 (six) hours as needed for wheezing or shortness of breath.   aspirin 325 MG tablet Take 325 mg by mouth every 6 (six) hours as needed (pain).   atorvastatin 20 MG tablet Commonly known as: LIPITOR TAKE 1 TABLET BY MOUTH EVERY DAY   B-D TB SYRINGE 1CC/27GX1/2" 27G X 1/2" 1 ML Misc Generic drug: TUBERCULIN SYR 1CC/27GX1/2" USE 1 SYRINGE TO INJECT METHOTREXATE INTO THE SKIN ONCE A WEEK.   blood glucose meter kit and supplies Kit Dispense based on patient and insurance preference. Use up to two times daily as directed. (FOR ICD-9 250.00, 250.01).   cefadroxil 500 MG capsule Commonly known as: DURICEF Take 2 capsules (1,000 mg total) by mouth 2 (two) times daily.   cetirizine 10 MG tablet Commonly known as: ZyrTEC Allergy Take 1 tablet (10 mg total) by mouth daily.   chlorthalidone 25 MG tablet Commonly known as: HYGROTON Take 1 tablet (25 mg total) by mouth daily.   diphenhydrAMINE 25 MG tablet Commonly known as: BENADRYL Take 25 mg by mouth every 6 (six) hours as needed for itching, allergies or sleep.   DULoxetine 60 MG capsule Commonly known as: CYMBALTA Take 60 mg by mouth daily.   ezetimibe 10 MG tablet Commonly known as: ZETIA TAKE 1 TABLET BY MOUTH EVERY DAY   HYDROcodone-acetaminophen 7.5-325 MG tablet Commonly known as: NORCO Take 1 tablet by mouth every 6 (six) hours as needed for moderate pain.   hydrOXYzine 25 MG tablet Commonly known as: ATARAX Take 1 tablet (25 mg total) by mouth every 8 (eight) hours as needed for itching.   Magnesium 250 MG Tabs Take 1 tablet (250 mg total) by mouth every evening. With meal   metFORMIN 500 MG tablet Commonly known as: GLUCOPHAGE TAKE 1 TABLET BY MOUTH 2 TIMES DAILY WITH A MEAL.   methotrexate 50 MG/2ML  injection Inject 0.8 mls (20 mg total) into the skin weekly.   metoprolol tartrate 25 MG tablet Commonly known as: LOPRESSOR Take 1 tablet (25 mg total) by mouth daily. What changed:  medication strength how much to take additional instructions   OneTouch Delica Plus Lancet33G Misc 2 (two) times daily. use for testing   OneTouch Verio test strip Generic drug: glucose blood TEST TWICE DAILY   tiZANidine 4 MG tablet Commonly known as: ZANAFLEX Take 4 mg by mouth every 6 (six) hours.   triamcinolone ointment 0.1 % Commonly known as: KENALOG Apply 1 application topically daily as needed (rash, itching).   Vitamin D (Ergocalciferol) 1.25 MG (50000 UNIT) Caps capsule Commonly known as: DRISDOL TAKE 1 CAPSULE BY MOUTH 2 (TWO) TIMES A WEEK. TAKE 1 CAPSULE BY MOUTH ON TUES AND ONE ON FRIDAYS        Follow-up Information     Arnette Felts, FNP Follow up in 1 week(s).   Specialty: General Practice Contact information: [address] [place] [address] [phone]                  Time coordinating discharge: 45 min  Signed:  Joseph Art DO  [facility] 06/10/2023, 9:22 AM

## 2023-06-11 LAB — CULTURE, BLOOD (ROUTINE X 2)
Culture: NO GROWTH
Culture: NO GROWTH
Special Requests: ADEQUATE
Special Requests: ADEQUATE

## 2023-06-13 ENCOUNTER — Telehealth: Payer: Self-pay

## 2023-06-13 NOTE — Transitions of Care (Post Inpatient/ED Visit) (Signed)
   06/13/2023  Name: Shelby Robertson MRN: [id number] DOB: Nov 30, 1970  Today's TOC FU Call Status: Today's TOC FU Call Status:: Unsuccessful Call (1st Attempt) Unsuccessful Call (1st Attempt) Date: 06/13/23  Attempted to reach the patient regarding the most recent Inpatient/ED visit.  Follow Up Plan: Additional outreach attempts will be made to reach the patient to complete the Transitions of Care (Post Inpatient/ED visit) call.     Antionette Fairy, RN,BSN,CCM [facility] Care Management Care Management Community Coordinator Direct Phone: [phone] Toll Free: [phone] Fax: [phone]

## 2023-06-14 ENCOUNTER — Telehealth: Payer: Self-pay

## 2023-06-14 NOTE — Transitions of Care (Post Inpatient/ED Visit) (Signed)
   06/14/2023  Name: Shelby Robertson MRN: [id number] DOB: [birth year]  Today's TOC FU Call Status: Today's TOC FU Call Status:: Unsuccessful Call (3rd Attempt) Unsuccessful Call (3rd Attempt) Date: 06/14/23  Attempted to reach the patient regarding the most recent Inpatient/ED visit.  Follow Up Plan: No further outreach attempts will be made at this time. We have been unable to contact the patient.    Antionette Fairy, RN,BSN,CCM [facility] Care Management Care Management Community Coordinator Direct Phone: [phone] Toll Free: [phone] Fax: [phone]

## 2023-06-14 NOTE — Transitions of Care (Post Inpatient/ED Visit) (Signed)
   06/14/2023  Name: Kevin Brownell MRN: [id number] DOB: 03-03-1971  Today's TOC FU Call Status: Today's TOC FU Call Status:: Unsuccessful Call (2nd Attempt) Unsuccessful Call (2nd Attempt) Date: 06/14/23  Attempted to reach the patient regarding the most recent Inpatient/ED visit.  Follow Up Plan: Additional outreach attempts will be made to reach the patient to complete the Transitions of Care (Post Inpatient/ED visit) call.      Antionette Fairy, RN,BSN,CCM [facility] Care Management Care Management Community Coordinator Direct Phone: [phone] Toll Free: [phone] Fax: [phone]

## 2023-07-01 ENCOUNTER — Other Ambulatory Visit: Payer: Self-pay | Admitting: Nurse Practitioner

## 2023-07-01 DIAGNOSIS — E1169 Type 2 diabetes mellitus with other specified complication: Secondary | ICD-10-CM

## 2023-07-09 ENCOUNTER — Other Ambulatory Visit: Payer: Self-pay | Admitting: Nurse Practitioner

## 2023-07-09 DIAGNOSIS — I10 Essential (primary) hypertension: Secondary | ICD-10-CM

## 2023-07-09 DIAGNOSIS — E1169 Type 2 diabetes mellitus with other specified complication: Secondary | ICD-10-CM

## 2023-07-09 DIAGNOSIS — E782 Mixed hyperlipidemia: Secondary | ICD-10-CM

## 2023-07-09 DIAGNOSIS — R600 Localized edema: Secondary | ICD-10-CM

## 2023-07-15 ENCOUNTER — Ambulatory Visit (HOSPITAL_COMMUNITY)
Admission: EM | Admit: 2023-07-15 | Discharge: 2023-07-15 | Disposition: A | Discharge status: Home / Self Care | Payer: Medicare HMO | Attending: Internal Medicine | Admitting: Internal Medicine

## 2023-07-15 ENCOUNTER — Encounter (HOSPITAL_COMMUNITY): Payer: Self-pay

## 2023-07-15 DIAGNOSIS — M25561 Pain in right knee: Secondary | ICD-10-CM | POA: Diagnosis not present

## 2023-07-15 DIAGNOSIS — M25562 Pain in left knee: Secondary | ICD-10-CM | POA: Diagnosis not present

## 2023-07-15 DIAGNOSIS — M069 Rheumatoid arthritis, unspecified: Secondary | ICD-10-CM

## 2023-07-15 DIAGNOSIS — M2550 Pain in unspecified joint: Secondary | ICD-10-CM | POA: Diagnosis not present

## 2023-07-15 DIAGNOSIS — G8929 Other chronic pain: Secondary | ICD-10-CM

## 2023-07-15 MED ORDER — DEXAMETHASONE SODIUM PHOSPHATE 10 MG/ML IJ SOLN
INTRAMUSCULAR | Status: AC
  Filled 2023-07-15: qty 1

## 2023-07-15 MED ORDER — DEXAMETHASONE SODIUM PHOSPHATE 10 MG/ML IJ SOLN
10.0000 mg | Freq: Once | INTRAMUSCULAR | Status: AC
  Administered 2023-07-15: 10 mg via INTRAMUSCULAR

## 2023-07-15 MED ORDER — PREDNISONE 20 MG PO TABS: 20.0000 mg | ORAL_TABLET | Freq: Every day | ORAL | 0 refills | Status: AC

## 2023-07-15 NOTE — Discharge Instructions (Addendum)
Gave you a shot of steroid in the clinic tonight. You may take prednisone 20 mg once daily for the next 5 days starting tomorrow. Take prednisone with food to avoid stomach upset. Please follow-up with your primary care provider in the next 3 to 4 days for ongoing evaluation. Please work on finding another rheumatologist in the area to take over your care.  If you develop any new or worsening symptoms or if your symptoms do not start to improve, please return here or follow-up with your primary care provider. If your symptoms are severe, please go to the emergency room.

## 2023-07-15 NOTE — ED Triage Notes (Signed)
Pt states that she has a history of muscle aches.

## 2023-07-15 NOTE — ED Provider Notes (Signed)
[facility]    CSN: [id number] Arrival date & time: 07/15/23  1936      History   Chief Complaint Chief Complaint  Patient presents with   Muscle Pain    HPI Shelby Robertson is a 52 y.o. female.   Shelby Robertson is a 52 y.o. female presenting for chief complaint of generalized body aches that started 2 days ago. Aches and pains are mostly to the bilateral knees, although she reports generalized pain as well. History of fibromyalgia, osteoarthritis, and rheumatoid arthritis. She attributes body aches to the changes in the weather with the atmospheric pressure and the rain recently. No recent trauma/injuries to the areas of tenderness, dizziness, rashes, palpable joint swelling, fever/chills, extremity weakness, paresthesias, or recent medication changes. No recent steroid use. She uses Humira for RA, although she has not seen her rheumatologist in over 6 months due to being dismissed from the practice. Currently searching for new RA provider. She also sees pain management for chronic pain who prescribes her duloxetine, tizanidine, and hydrocodone-acetaminophen. She has been taking all of the aforementioned medications without relief of pain. Requesting steroid today, she has not had steroids in the last 6 months. History of DM 2, sugars have been well controlled on metformin. Most recent HA1C 5.7 in April 2024.   Muscle Pain    Past Medical History:  Diagnosis Date   AMS (altered mental status) 09/17/2019   Diabetes mellitus without complication (HCC)    Eczema    Fibromyalgia    Fibromyalgia    Hypertension    Osteoarthritis    Rheumatoid arthritis(714.0)    Rheumatoid arthritis(714.0)    Sepsis due to Escherichia coli (E. coli) (HCC) 11/28/2021    Patient Active Problem List   Diagnosis Date Noted   Sepsis secondary to UTI (HCC) 06/06/2023   Hot flashes 01/25/2023   Eczema 03/13/2022   Pyelonephritis 11/26/2021   Hematuria 11/26/2021   Tachycardia  11/26/2021   Controlled type 2 diabetes mellitus with complication, without long-term current use of insulin (HCC) 10/14/2020   Atopic dermatitis 05/29/2020   Sepsis (HCC) 09/17/2019   Morbid obesity (HCC) 01/10/2019   Tobacco use disorder, continuous 01/10/2019   Soft tissue swelling of back 01/10/2019   Fibromyalgia 07/27/2017   Other fatigue 07/27/2017   History of hidradenitis suppurativa 07/27/2017   History of hypertension 07/27/2017   History of hyperlipidemia 07/27/2017   Smoker 07/27/2017   High risk medication use 08/21/2016   Rheumatoid arthritis of multiple sites without rheumatoid factor (HCC) 08/12/2016   Folliculitis 06/28/2014   ARTHRITIS, GENERALIZED 07/14/2010   HYPERTENSION, BENIGN ESSENTIAL 03/04/2009   FIBROMYALGIA 11/09/2007   HIDRADENITIS SUPPURATIVA 07/11/2007   Mixed hyperlipidemia 06/28/2007   LEG EDEMA, BILATERAL 05/05/2007    History reviewed. No pertinent surgical history.  OB History   No obstetric history on file.      Home Medications    Prior to Admission medications   Medication Sig Start Date End Date Taking? Authorizing Provider  acetaminophen (TYLENOL) 325 MG tablet Take 2 tablets (650 mg total) by mouth every 6 (six) hours as needed for mild pain (or Fever >/= 101). 11/30/21  Yes Elgergawy, Leana Roe, MD  albuterol (PROVENTIL) (2.5 MG/3ML) 0.083% nebulizer solution Take 3 mLs (2.5 mg total) by nebulization every 6 (six) hours as needed for wheezing or shortness of breath. 07/13/21  Yes Arnette Felts, FNP  albuterol (VENTOLIN HFA) 108 (90 Base) MCG/ACT inhaler Inhale 2 puffs into the lungs every 4 (four) hours as needed  for wheezing or shortness of breath (cough, shortness of breath or wheezing.). 10/11/19  Yes Elvina Sidle, MD  aspirin 325 MG tablet Take 325 mg by mouth every 6 (six) hours as needed (pain).   Yes [provider]  atorvastatin (LIPITOR) 20 MG tablet TAKE 1 TABLET BY MOUTH EVERY DAY 11/23/22  Yes Arnette Felts, FNP   B-D TB SYRINGE 1CC/27GX1/2" 27G X 1/2" 1 ML MISC USE 1 SYRINGE TO INJECT METHOTREXATE INTO THE SKIN ONCE A WEEK. 07/23/19  Yes Deveshwar, Janalyn Rouse, MD  blood glucose meter kit and supplies KIT Dispense based on patient and insurance preference. Use up to two times daily as directed. (FOR ICD-9 250.00, 250.01). 01/17/20  Yes Arnette Felts, FNP  cefadroxil (DURICEF) 500 MG capsule Take 2 capsules (1,000 mg total) by mouth 2 (two) times daily. 06/10/23  Yes Joseph Art, DO  cetirizine (ZYRTEC ALLERGY) 10 MG tablet Take 1 tablet (10 mg total) by mouth daily. 03/13/22  Yes Ellsworth Lennox, PA-C  chlorthalidone (HYGROTON) 25 MG tablet Take 1 tablet (25 mg total) by mouth daily. 01/25/23  Yes Arnette Felts, FNP  diphenhydrAMINE (BENADRYL) 25 MG tablet Take 25 mg by mouth every 6 (six) hours as needed for itching, allergies or sleep.   Yes [provider]  DULoxetine (CYMBALTA) 60 MG capsule Take 60 mg by mouth daily.   Yes [provider]  ezetimibe (ZETIA) 10 MG tablet TAKE 1 TABLET BY MOUTH EVERY DAY Patient taking differently: Take 10 mg by mouth daily. 03/27/21  Yes Arnette Felts, FNP  HYDROcodone-acetaminophen (NORCO) 7.5-325 MG tablet Take 1 tablet by mouth every 6 (six) hours as needed for moderate pain. 01/02/23  Yes [provider]  hydrOXYzine (ATARAX/VISTARIL) 25 MG tablet Take 1 tablet (25 mg total) by mouth every 8 (eight) hours as needed for itching. 05/15/20  Yes Lamptey, Britta Mccreedy, MD  Lancets [facility] DELICA PLUS LANCET33G) MISC 2 (two) times daily. use for testing 01/17/20  Yes [provider]  Magnesium 250 MG TABS Take 1 tablet (250 mg total) by mouth every evening. With meal 01/25/23  Yes Arnette Felts, FNP  metFORMIN (GLUCOPHAGE) 500 MG tablet TAKE 1 TABLET BY MOUTH 2 TIMES DAILY WITH A MEAL. 11/23/22  Yes Arnette Felts, FNP  methotrexate 50 MG/2ML injection Inject 0.8 mls (20 mg total) into the skin weekly. 07/28/21  Yes Gearldine Bienenstock, PA-C  metoprolol tartrate  (LOPRESSOR) 25 MG tablet Take 1 tablet (25 mg total) by mouth daily. 06/10/23  Yes Joseph Art, DO  ONETOUCH VERIO test strip TEST TWICE DAILY 02/11/20  Yes Arnette Felts, FNP  predniSONE (DELTASONE) 20 MG tablet Take 1 tablet (20 mg total) by mouth daily for 5 days. 07/15/23 07/20/23 Yes Taj Arteaga, Donavan Burnet, FNP  tiZANidine (ZANAFLEX) 4 MG tablet Take 4 mg by mouth every 6 (six) hours. 12/30/22  Yes [provider]  triamcinolone ointment (KENALOG) 0.1 % Apply 1 application topically daily as needed (rash, itching). 09/30/20  Yes [provider]  Vitamin D, Ergocalciferol, (DRISDOL) 1.25 MG (50000 UNIT) CAPS capsule TAKE 1 CAPSULE BY MOUTH 2 (TWO) TIMES A WEEK. TAKE 1 CAPSULE BY MOUTH ON TUES AND ONE ON FRIDAYS 11/23/22  Yes Arnette Felts, FNP    Family History Family History  Problem Relation Age of Onset   Diabetes Mother    Healthy Father    Heart attack Maternal Grandmother    Stroke Maternal Grandmother     Social History Social History   Tobacco Use  Smoking status: Every Day    Current packs/day: 0.50    Average packs/day: 0.5 packs/day for 20.0 years (10.0 ttl pk-yrs)    Types: Cigarettes    Passive exposure: Never   Smokeless tobacco: Never   Tobacco comments:    0.5 - 1 ppd depending on how she feels  Vaping Use   Vaping status: Never Used  Substance Use Topics   Alcohol use: Not Currently   Drug use: Not Currently    Types: Hydrocodone     Allergies   Patient has no known allergies.   Review of Systems Review of Systems Per HPI  Physical Exam Triage Vital Signs ED Triage Vitals  Encounter Vitals Group     BP 07/15/23 1953 (!) 140/96     Systolic BP Percentile --      Diastolic BP Percentile --      Pulse Rate 07/15/23 1953 (!) 112     Resp 07/15/23 1953 17     Temp 07/15/23 1953 97.9 F (36.6 C)     Temp Source 07/15/23 1953 Oral     SpO2 07/15/23 1953 96 %     Weight 07/15/23 1951 270 lb (122.5 kg)     Height 07/15/23 1951 5'  5" (1.651 m)     Head Circumference --      Peak Flow --      Pain Score 07/15/23 1951 8     Pain Loc --      Pain Education --      Exclude from Growth Chart --    No data found.  Updated Vital Signs BP (!) 140/96 (BP Location: Left Arm)   Pulse (!) 112   Temp 97.9 F (36.6 C) (Oral)   Resp 17   Ht 5\' 5"  (1.651 m)   Wt 270 lb (122.5 kg)   SpO2 96%   BMI 44.93 kg/m   Visual Acuity Right Eye Distance:   Left Eye Distance:   Bilateral Distance:    Right Eye Near:   Left Eye Near:    Bilateral Near:     Physical Exam Vitals and nursing note reviewed.  Constitutional:      Appearance: She is obese. She is not ill-appearing or toxic-appearing.  HENT:     Head: Normocephalic and atraumatic.     Right Ear: Hearing and external ear normal.     Left Ear: Hearing and external ear normal.     Nose: Nose normal.     Mouth/Throat:     Lips: Pink.  Eyes:     General: Lids are normal. Vision grossly intact. Gaze aligned appropriately.     Extraocular Movements: Extraocular movements intact.     Conjunctiva/sclera: Conjunctivae normal.  Cardiovascular:     Rate and Rhythm: Normal rate and regular rhythm.     Heart sounds: Normal heart sounds, S1 normal and S2 normal.  Pulmonary:     Effort: Pulmonary effort is normal. No respiratory distress.     Breath sounds: Normal breath sounds and air entry.  Musculoskeletal:        General: Tenderness present.     Cervical back: Normal and neck supple.     Thoracic back: Normal.     Lumbar back: Normal.     Right knee: No swelling, deformity, erythema, ecchymosis or bony tenderness. Normal range of motion. Tenderness present over the patellar tendon. Normal alignment.     Left knee: No swelling, deformity, erythema, ecchymosis or bony tenderness. Normal range of motion.  Tenderness present over the patellar tendon. Normal alignment.     Right lower leg: Normal. No edema.     Left lower leg: Normal. No edema.     Comments: Strength  and sensation intact distally to pain, 5/5 strength against resistance with flexion and extension at the bilateral knee joints. Normal ROM. Ambulatory with steady gait.  Skin:    General: Skin is warm and dry.     Capillary Refill: Capillary refill takes less than 2 seconds.     Findings: No rash.  Neurological:     General: No focal deficit present.     Mental Status: She is alert and oriented to person, place, and time. Mental status is at baseline.     Cranial Nerves: No dysarthria or facial asymmetry.  Psychiatric:        Mood and Affect: Mood normal.        Speech: Speech normal.        Behavior: Behavior normal.        Thought Content: Thought content normal.        Judgment: Judgment normal.      UC Treatments / Results  Labs (all labs ordered are listed, but only abnormal results are displayed) Labs Reviewed - No data to display  EKG   Radiology No results found.  Procedures Procedures (including critical care time)  Medications Ordered in UC Medications  dexamethasone (DECADRON) injection 10 mg (10 mg Intramuscular Given 07/15/23 2013)    Initial Impression / Assessment and Plan / UC Course  I have reviewed the triage vital signs and the nursing notes.  Pertinent labs & imaging results that were available during my care of the patient were reviewed by me and considered in my medical decision making (see chart for details).   1. Polyarthralgia, chronic pain of both knees, rheumatoid arthritis flare Presentation suggestive of RA flare, will treat with dexamethasone 10mg  IM and 20mg  prednisone burst daily for 5 days with food. Discussed temporary increase in blood sugars secondary to steroid, patient to watch her diet over the next few days while on steroid.  Recommend follow-up with PCP to discuss finding new rheumatologist to manage care.  No signs of infection/septic arthritis or any red flag signs/symptoms indicating need for imaging or referral to  ED.  Counseled patient on potential for adverse effects with medications prescribed/recommended today, strict ER and return-to-clinic precautions discussed, patient verbalized understanding.    Final Clinical Impressions(s) / UC Diagnoses   Final diagnoses:  Polyarthralgia  Chronic pain of both knees  Rheumatoid arthritis flare (HCC)     Discharge Instructions      Gave you a shot of steroid in the clinic tonight. You may take prednisone 20 mg once daily for the next 5 days starting tomorrow. Take prednisone with food to avoid stomach upset. Please follow-up with your primary care provider in the next 3 to 4 days for ongoing evaluation. Please work on finding another rheumatologist in the area to take over your care.  If you develop any new or worsening symptoms or if your symptoms do not start to improve, please return here or follow-up with your primary care provider. If your symptoms are severe, please go to the emergency room.      ED Prescriptions     Medication Sig Dispense Auth. Provider   predniSONE (DELTASONE) 20 MG tablet Take 1 tablet (20 mg total) by mouth daily for 5 days. 5 tablet Carlisle Beers, FNP  PDMP not reviewed this encounter.   Reita May [place], [place] 07/16/23 [phone]

## 2023-07-29 ENCOUNTER — Other Ambulatory Visit: Payer: Self-pay | Admitting: Nurse Practitioner

## 2023-07-29 DIAGNOSIS — E782 Mixed hyperlipidemia: Secondary | ICD-10-CM

## 2023-07-29 DIAGNOSIS — E1169 Type 2 diabetes mellitus with other specified complication: Secondary | ICD-10-CM

## 2023-08-11 ENCOUNTER — Ambulatory Visit: Payer: Medicare HMO

## 2023-08-11 DIAGNOSIS — Z0000 Encounter for general adult medical examination without abnormal findings: Secondary | ICD-10-CM | POA: Diagnosis not present

## 2023-08-11 NOTE — Progress Notes (Signed)
Subjective:   Shelby Robertson is a 52 y.o. female who presents for Medicare Annual (Subsequent) preventive examination.  Visit Complete: Virtual I connected with  Shelby Robertson on 08/11/23 by a audio enabled telemedicine application and verified that I am speaking with the correct person using two identifiers.  Patient Location: Home  Provider Location: Office/Clinic  I discussed the limitations of evaluation and management by telemedicine. The patient expressed understanding and agreed to proceed.  Vital Signs: Because this visit was a virtual/telehealth visit, some criteria may be missing or patient reported. Any vitals not documented were not able to be obtained and vitals that have been documented are patient reported.    Cardiac Risk Factors include: diabetes mellitus;dyslipidemia;hypertension     Objective:    Today's Vitals   There is no height or weight on file to calculate BMI.     08/11/2023    2:39 PM 06/05/2023    8:52 PM 06/07/2022   12:17 PM 11/26/2021    9:00 PM 07/12/2021   11:00 AM 01/16/2020    2:32 PM 09/17/2019    8:01 PM  Advanced Directives  Does Patient Have a Medical Advance Directive? No No Yes No No No Yes  Type of Advance Directive       Healthcare Power of Attorney  Does patient want to make changes to medical advance directive?       No - Patient declined  Copy of Healthcare Power of Attorney in Chart?       No - copy requested  Would patient like information on creating a medical advance directive?  No - Patient declined   Yes (Inpatient - patient defers creating a medical advance directive at this time - Information given) Yes (MAU/Ambulatory/Procedural Areas - Information given)     Current Medications (verified) Outpatient Encounter Medications as of 08/11/2023  Medication Sig   acetaminophen (TYLENOL) 325 MG tablet Take 2 tablets (650 mg total) by mouth every 6 (six) hours as needed for mild pain (or Fever >/= 101).   albuterol  (PROVENTIL) (2.5 MG/3ML) 0.083% nebulizer solution Take 3 mLs (2.5 mg total) by nebulization every 6 (six) hours as needed for wheezing or shortness of breath.   albuterol (VENTOLIN HFA) 108 (90 Base) MCG/ACT inhaler Inhale 2 puffs into the lungs every 4 (four) hours as needed for wheezing or shortness of breath (cough, shortness of breath or wheezing.).   aspirin 325 MG tablet Take 325 mg by mouth every 6 (six) hours as needed (pain).   atorvastatin (LIPITOR) 20 MG tablet TAKE 1 TABLET BY MOUTH EVERY DAY   B-D TB SYRINGE 1CC/27GX1/2" 27G X 1/2" 1 ML MISC USE 1 SYRINGE TO INJECT METHOTREXATE INTO THE SKIN ONCE A WEEK.   blood glucose meter kit and supplies KIT Dispense based on patient and insurance preference. Use up to two times daily as directed. (FOR ICD-9 250.00, 250.01).   chlorthalidone (HYGROTON) 25 MG tablet Take 1 tablet (25 mg total) by mouth daily.   diphenhydrAMINE (BENADRYL) 25 MG tablet Take 25 mg by mouth every 6 (six) hours as needed for itching, allergies or sleep.   DULoxetine (CYMBALTA) 60 MG capsule Take 60 mg by mouth daily.   HYDROcodone-acetaminophen (NORCO) 7.5-325 MG tablet Take 1 tablet by mouth every 6 (six) hours as needed for moderate pain.   hydrOXYzine (ATARAX/VISTARIL) 25 MG tablet Take 1 tablet (25 mg total) by mouth every 8 (eight) hours as needed for itching.   Lancets (ONETOUCH DELICA PLUS LANCET33G) MISC 2 (  two) times daily. use for testing   Magnesium 250 MG TABS Take 1 tablet (250 mg total) by mouth every evening. With meal   metFORMIN (GLUCOPHAGE) 500 MG tablet TAKE 1 TABLET BY MOUTH 2 TIMES DAILY WITH A MEAL.   methotrexate 50 MG/2ML injection Inject 0.8 mls (20 mg total) into the skin weekly.   metoprolol tartrate (LOPRESSOR) 25 MG tablet Take 1 tablet (25 mg total) by mouth daily.   ONETOUCH VERIO test strip TEST TWICE DAILY   tiZANidine (ZANAFLEX) 4 MG tablet Take 4 mg by mouth every 6 (six) hours.   triamcinolone ointment (KENALOG) 0.1 % Apply 1  application topically daily as needed (rash, itching).   Vitamin D, Ergocalciferol, (DRISDOL) 1.25 MG (50000 UNIT) CAPS capsule TAKE 1 CAPSULE BY MOUTH 2 (TWO) TIMES A WEEK. TAKE 1 CAPSULE BY MOUTH ON TUES AND ONE ON FRIDAYS   cefadroxil (DURICEF) 500 MG capsule Take 2 capsules (1,000 mg total) by mouth 2 (two) times daily. (Patient not taking: Reported on 08/11/2023)   cetirizine (ZYRTEC ALLERGY) 10 MG tablet Take 1 tablet (10 mg total) by mouth daily. (Patient not taking: Reported on 08/11/2023)   ezetimibe (ZETIA) 10 MG tablet TAKE 1 TABLET BY MOUTH EVERY DAY (Patient not taking: Reported on 08/11/2023)   No facility-administered encounter medications on file as of 08/11/2023.    Allergies (verified) Patient has no known allergies.   History: Past Medical History:  Diagnosis Date   AMS (altered mental status) 09/17/2019   Diabetes mellitus without complication (HCC)    Eczema    Fibromyalgia    Fibromyalgia    Hypertension    Osteoarthritis    Rheumatoid arthritis(714.0)    Rheumatoid arthritis(714.0)    Sepsis due to Escherichia coli (E. coli) (HCC) 11/28/2021   History reviewed. No pertinent surgical history. Family History  Problem Relation Age of Onset   Diabetes Mother    Healthy Father    Heart attack Maternal Grandmother    Stroke Maternal Grandmother    Social History   Socioeconomic History   Marital status: Single    Spouse name: Not on file   Number of children: Not on file   Years of education: Not on file   Highest education level: Not on file  Occupational History   Occupation: disability  Tobacco Use   Smoking status: Every Day    Current packs/day: 0.50    Average packs/day: 0.5 packs/day for 20.0 years (10.0 ttl pk-yrs)    Types: Cigarettes    Passive exposure: Never   Smokeless tobacco: Never   Tobacco comments:    0.5 - 1 ppd depending on how she feels  Vaping Use   Vaping status: Never Used  Substance and Sexual Activity   Alcohol use:  Not Currently   Drug use: Yes    Types: Hydrocodone   Sexual activity: Yes    Birth control/protection: Condom  Other Topics Concern   Not on file  Social History Narrative   Not on file   Social Determinants of Health   Financial Resource Strain: Medium Risk (08/11/2023)   Overall Financial Resource Strain (CARDIA)    Difficulty of Paying Living Expenses: Somewhat hard  Food Insecurity: No Food Insecurity (08/11/2023)   Hunger Vital Sign    Worried About Running Out of Food in the Last Year: Never true    Ran Out of Food in the Last Year: Never true  Transportation Needs: No Transportation Needs (08/11/2023)   PRAPARE - Transportation  Lack of Transportation (Medical): No    Lack of Transportation (Non-Medical): No  Physical Activity: Sufficiently Active (08/11/2023)   Exercise Vital Sign    Days of Exercise per Week: 7 days    Minutes of Exercise per Session: 30 min  Stress: No Stress Concern Present (08/11/2023)   [employer] of Occupational Health - Occupational Stress Questionnaire    Feeling of Stress : Only a little  Social Connections: Socially Isolated (08/11/2023)   Social Connection and Isolation Panel [NHANES]    Frequency of Communication with Friends and Family: More than three times a week    Frequency of Social Gatherings with Friends and Family: Never    Attends Religious Services: Never    Database administrator or Organizations: No    Attends Engineer, structural: Never    Marital Status: Never married    Tobacco Counseling Ready to quit: Not Answered Counseling given: Not Answered Tobacco comments: 0.5 - 1 ppd depending on how she feels   Clinical Intake:  Pre-visit preparation completed: Yes  Pain : No/denies pain     Nutritional Risks: None Diabetes: Yes CBG done?: No Did pt. bring in CBG monitor from home?: No  How often do you need to have someone help you when you read instructions, pamphlets, or other written  materials from your doctor or pharmacy?: 1 - Never  Interpreter Needed?: No  Information entered by :: NAllen LPN   Activities of Daily Living    08/11/2023    2:32 PM 06/06/2023    8:36 AM  In your present state of health, do you have any difficulty performing the following activities:  Hearing? 0   Vision? 0   Difficulty concentrating or making decisions? 0   Walking or climbing stairs? 0   Dressing or bathing? 0   Doing errands, shopping? 0 0  Preparing Food and eating ? N   Using the Toilet? N   In the past six months, have you accidently leaked urine? Y   Comment hard sneeze   Do you have problems with loss of bowel control? N   Managing your Medications? N   Managing your Finances? N   Housekeeping or managing your Housekeeping? N     Patient Care Team: Arnette Felts, FNP as PCP - General (General Practice) Clarene Duke, Karma Lew, RN as [facility] Care Management  Indicate any recent Medical Services you may have received from other than [facility] providers in the past year (date may be approximate).     Assessment:   This is a routine wellness examination for Shelby Robertson.  Hearing/Vision screen Hearing Screening - Comments:: Denies hearing issues Vision Screening - Comments:: No regular eye exams,    Goals Addressed             This Visit's Progress    Patient Stated       08/11/2023, lose weight       Depression Screen    08/11/2023    2:41 PM 01/25/2023    8:55 AM 06/07/2022   12:14 PM 06/07/2022   11:56 AM 07/12/2021   11:01 AM 02/12/2021    9:10 AM 01/16/2020    2:35 PM  PHQ 2/9 Scores  PHQ - 2 Score 0 0 0 0 0 0 1  PHQ- 9 Score 3          Fall Risk    08/11/2023    2:40 PM 01/25/2023    8:55 AM 06/07/2022   12:18 PM  06/07/2022   11:56 AM 07/12/2021   11:00 AM  Fall Risk   Falls in the past year? 0 0 1 0 0  Number falls in past yr: 0  1 0 0  Injury with Fall? 0  0 0 0  Risk for fall due to : Medication side effect  No Fall Risks No Fall  Risks No Fall Risks  Follow up Falls prevention discussed;Falls evaluation completed  Falls evaluation completed Falls evaluation completed Falls evaluation completed    MEDICARE RISK AT HOME: Medicare Risk at Home Any stairs in or around the home?: Yes If so, are there any without handrails?: No Home free of loose throw rugs in walkways, pet beds, electrical cords, etc?: Yes Adequate lighting in your home to reduce risk of falls?: Yes Life alert?: No Use of a cane, walker or w/c?: No Grab bars in the bathroom?: No Shower chair or bench in shower?: No Elevated toilet seat or a handicapped toilet?: No  TIMED UP AND GO:  Was the test performed?  No    Cognitive Function:        08/11/2023    2:42 PM 06/07/2022   12:19 PM 07/12/2021   11:13 AM 01/16/2020    2:41 PM 01/10/2019   11:38 AM  6CIT Screen  What Year? 0 points 0 points 0 points 0 points 0 points  What month? 0 points 0 points 0 points 0 points 0 points  What time? 0 points 0 points 0 points 0 points 0 points  Count back from 20 0 points 0 points 0 points 0 points 0 points  Months in reverse 0 points 0 points 0 points 2 points 4 points  Repeat phrase 6 points 0 points 0 points 0 points 0 points  Total Score 6 points 0 points 0 points 2 points 4 points    Immunizations Immunization History  Administered Date(s) Administered   Influenza,inj,Quad PF,6+ Mos 09/18/2019, 10/14/2020, 07/15/2021   Influenza-Unspecified 07/18/2014   PFIZER(Purple Top)SARS-COV-2 Vaccination 02/11/2020, 03/03/2020   PNEUMOCOCCAL CONJUGATE-20 06/09/2023   Pfizer Covid-19 Vaccine Bivalent Booster 58yrs & up 09/08/2021   Td 11/11/2008   Tdap 06/07/2022   Zoster Recombinant(Shingrix) 01/25/2023    TDAP status: Up to date  Flu Vaccine status: Due, Education has been provided regarding the importance of this vaccine. Advised may receive this vaccine at local pharmacy or Health Dept. Aware to provide a copy of the vaccination record if obtained  from local pharmacy or Health Dept. Verbalized acceptance and understanding.  Pneumococcal vaccine status: Up to date  Covid-19 vaccine status: Information provided on how to obtain vaccines.   Qualifies for Shingles Vaccine? Yes   Zostavax completed No   Shingrix Completed?: needs second dose  Screening Tests Health Maintenance  Topic Date Due   OPHTHALMOLOGY EXAM  06/16/2022   Zoster Vaccines- Shingrix (2 of 2) 03/22/2023   INFLUENZA VACCINE  05/19/2023   COVID-19 Vaccine (4 - 2023-24 season) 06/19/2023   MAMMOGRAM  07/15/2023   HEMOGLOBIN A1C  07/27/2023   Diabetic kidney evaluation - Urine ACR  01/25/2024   FOOT EXAM  01/25/2024   Diabetic kidney evaluation - eGFR measurement  06/08/2024   Medicare Annual Wellness (AWV)  08/10/2024   Cervical Cancer Screening (HPV/Pap Cotest)  06/07/2025   Colonoscopy  01/29/2032   DTaP/Tdap/Td (3 - Td or Tdap) 06/07/2032   Hepatitis C Screening  Completed   HIV Screening  Completed   HPV VACCINES  Aged Out    Health Maintenance  Health Maintenance Due  Topic Date Due   OPHTHALMOLOGY EXAM  06/16/2022   Zoster Vaccines- Shingrix (2 of 2) 03/22/2023   INFLUENZA VACCINE  05/19/2023   COVID-19 Vaccine (4 - 2023-24 season) 06/19/2023   MAMMOGRAM  07/15/2023   HEMOGLOBIN A1C  07/27/2023    Colorectal cancer screening: Type of screening: Colonoscopy. Completed 01/28/2022. Repeat every 10 years  Mammogram status: Completed 07/14/2021. Repeat every year  Bone Density status: n/a  Lung Cancer Screening: (Low Dose CT Chest recommended if Age 57-80 years, 20 pack-year currently smoking OR have quit w/in 15years.) does not qualify.   Lung Cancer Screening Referral: no  Additional Screening:  Hepatitis C Screening: does qualify; Completed 06/07/2022  Vision Screening: Recommended annual ophthalmology exams for early detection of glaucoma and other disorders of the eye. Is the patient up to date with their annual eye exam?  No  Who is the  provider or what is the name of the office in which the patient attends annual eye exams? none If pt is not established with a provider, would they like to be referred to a provider to establish care? No .   Dental Screening: Recommended annual dental exams for proper oral hygiene  Diabetic Foot Exam: Diabetic Foot Exam: Completed 01/25/2023  Community Resource Referral / Chronic Care Management: CRR required this visit?  No   CCM required this visit?  No     Plan:     I have personally reviewed and noted the following in the patient's chart:   Medical and social history Use of alcohol, tobacco or illicit drugs  Current medications and supplements including opioid prescriptions. Patient is currently taking opioid prescriptions. Information provided to patient regarding non-opioid alternatives. Patient advised to discuss non-opioid treatment plan with their provider. Functional ability and status Nutritional status Physical activity Advanced directives List of other physicians Hospitalizations, surgeries, and ER visits in previous 12 months Vitals Screenings to include cognitive, depression, and falls Referrals and appointments  In addition, I have reviewed and discussed with patient certain preventive protocols, quality metrics, and best practice recommendations. A written personalized care plan for preventive services as well as general preventive health recommendations were provided to patient.     Barb Merino, LPN   [id number]   After Visit Summary: (Pick Up) Due to this being a telephonic visit, with patients personalized plan was offered to patient and patient has requested to Pick up at office.  Nurse Notes: none

## 2023-08-11 NOTE — Patient Instructions (Signed)
Shelby Robertson , Thank you for taking time to come for your Medicare Wellness Visit. I appreciate your ongoing commitment to your health goals. Please review the following plan we discussed and let me know if I can assist you in the future.   Referrals/Orders/Follow-Ups/Clinician Recommendations: none  Managing Pain Without Opioids Opioids are strong medicines used to treat moderate to severe pain. For some people, especially those who have long-term (chronic) pain, opioids may not be the best choice for pain management due to: Side effects like nausea, constipation, and sleepiness. The risk of addiction (opioid use disorder). The longer you take opioids, the greater your risk of addiction. Pain that lasts for more than 3 months is called chronic pain. Managing chronic pain usually requires more than one approach and is often provided by a team of health care providers working together (multidisciplinary approach). Pain management may be done at a pain management center or pain clinic. How to manage pain without the use of opioids Use non-opioid medicines Non-opioid medicines for pain may include: Over-the-counter or prescription non-steroidal anti-inflammatory drugs (NSAIDs). These may be the first medicines used for pain. They work well for muscle and bone pain, and they reduce swelling. Acetaminophen. This over-the-counter medicine may work well for milder pain but not swelling. Antidepressants. These may be used to treat chronic pain. A certain type of antidepressant (tricyclics) is often used. These medicines are given in lower doses for pain than when used for depression. Anticonvulsants. These are usually used to treat seizures but may also reduce nerve (neuropathic) pain. Muscle relaxants. These relieve pain caused by sudden muscle tightening (spasms). You may also use a pain medicine that is applied to the skin as a patch, cream, or gel (topical analgesic), such as a numbing medicine. These  may cause fewer side effects than medicines taken by mouth. Do certain therapies as directed Some therapies can help with pain management. They include: Physical therapy. You will do exercises to gain strength and flexibility. A physical therapist may teach you exercises to move and stretch parts of your body that are weak, stiff, or painful. You can learn these exercises at physical therapy visits and practice them at home. Physical therapy may also involve: Massage. Heat wraps or applying heat or cold to affected areas. Electrical signals that interrupt pain signals (transcutaneous electrical nerve stimulation, TENS). Weak lasers that reduce pain and swelling (low-level laser therapy). Signals from your body that help you learn to regulate pain (biofeedback). Occupational therapy. This helps you to learn ways to function at home and work with less pain. Recreational therapy. This involves trying new activities or hobbies, such as a physical activity or drawing. Mental health therapy, including: Cognitive behavioral therapy (CBT). This helps you learn coping skills for dealing with pain. Acceptance and commitment therapy (ACT) to change the way you think and react to pain. Relaxation therapies, including muscle relaxation exercises and mindfulness-based stress reduction. Pain management counseling. This may be individual, family, or group counseling.  Receive medical treatments Medical treatments for pain management include: Nerve block injections. These may include a pain blocker and anti-inflammatory medicines. You may have injections: Near the spine to relieve chronic back or neck pain. Into joints to relieve back or joint pain. Into nerve areas that supply a painful area to relieve body pain. Into muscles (trigger point injections) to relieve some painful muscle conditions. A medical device placed near your spine to help block pain signals and relieve nerve pain or chronic back pain  (spinal  cord stimulation device). Acupuncture. Follow these instructions at home Medicines Take over-the-counter and prescription medicines only as told by your health care provider. If you are taking pain medicine, ask your health care providers about possible side effects to watch out for. Do not drive or use heavy machinery while taking prescription opioid pain medicine. Lifestyle  Do not use drugs or alcohol to reduce pain. If you drink alcohol, limit how much you have to: 0-1 drink a day for women who are not pregnant. 0-2 drinks a day for men. Know how much alcohol is in a drink. In the [place] one drink equals one 12 oz bottle of beer (355 mL), one 5 oz glass of wine (148 mL), or one 1 oz glass of hard liquor (44 mL). Do not use any products that contain nicotine or tobacco. These products include cigarettes, chewing tobacco, and vaping devices, such as e-cigarettes. If you need help quitting, ask your health care provider. Eat a healthy diet and maintain a healthy weight. Poor diet and excess weight may make pain worse. Eat foods that are high in fiber. These include fresh fruits and vegetables, whole grains, and beans. Limit foods that are high in fat and processed sugars, such as fried and sweet foods. Exercise regularly. Exercise lowers stress and may help relieve pain. Ask your health care provider what activities and exercises are safe for you. If your health care provider approves, join an exercise class that combines movement and stress reduction. Examples include yoga and tai chi. Get enough sleep. Lack of sleep may make pain worse. Lower stress as much as possible. Practice stress reduction techniques as told by your therapist. General instructions Work with all your pain management providers to find the treatments that work best for you. You are an important member of your pain management team. There are many things you can do to reduce pain on your own. Consider joining an  online or in-person support group for people who have chronic pain. Keep all follow-up visits. This is important. Where to find more information You can find more information about managing pain without opioids from: American Academy of Pain Medicine: painmed.org Institute for Chronic Pain: instituteforchronicpain.org American Chronic Pain Association: theacpa.org Contact a health care provider if: You have side effects from pain medicine. Your pain gets worse or does not get better with treatments or home therapy. You are struggling with anxiety or depression. Summary Many types of pain can be managed without opioids. Chronic pain may respond better to pain management without opioids. Pain is best managed when you and a team of health care providers work together. Pain management without opioids may include non-opioid medicines, medical treatments, physical therapy, mental health therapy, and lifestyle changes. Tell your health care providers if your pain gets worse or is not being managed well enough. This information is not intended to replace advice given to you by your health care provider. Make sure you discuss any questions you have with your health care provider. Document Revised: 01/14/2021 Document Reviewed: 01/14/2021 Elsevier Patient Education  2024 [employer].   This is a list of the screening recommended for you and due dates:  Health Maintenance  Topic Date Due   Eye exam for diabetics  06/16/2022   Zoster (Shingles) Vaccine (2 of 2) 03/22/2023   Flu Shot  05/19/2023   COVID-19 Vaccine (4 - 2023-24 season) 06/19/2023   Mammogram  07/15/2023   Hemoglobin A1C  07/27/2023   Yearly kidney health urinalysis for diabetes  01/25/2024   Complete foot exam   01/25/2024   Yearly kidney function blood test for diabetes  06/08/2024   Medicare Annual Wellness Visit  08/10/2024   Pap with HPV screening  06/07/2025   Colon Cancer Screening  01/29/2032   DTaP/Tdap/Td vaccine (3  - Td or Tdap) 06/07/2032   Hepatitis C Screening  Completed   HIV Screening  Completed   HPV Vaccine  Aged Out    Advanced directives: (ACP Link)Information on Advanced Care Planning can be found at [facility] Advance Health Care Directives Advance Health Care Directives ([email or web address]   Next Medicare Annual Wellness Visit scheduled for next year: No, office will schedule appointment  insert Preventive Care Attachment Reference

## 2023-08-11 NOTE — Addendum Note (Signed)
Addended by: Barb Merino on: 08/11/2023 02:53 PM   Modules accepted: Orders

## 2023-08-12 ENCOUNTER — Telehealth: Payer: Self-pay | Admitting: *Deleted

## 2023-08-12 ENCOUNTER — Other Ambulatory Visit: Payer: Self-pay | Admitting: Nurse Practitioner

## 2023-08-12 DIAGNOSIS — E782 Mixed hyperlipidemia: Secondary | ICD-10-CM

## 2023-08-12 DIAGNOSIS — E1169 Type 2 diabetes mellitus with other specified complication: Secondary | ICD-10-CM

## 2023-08-12 NOTE — Progress Notes (Unsigned)
Care Coordination  Outreach Note  08/12/2023 Name: Shelby Robertson MRN: [id number] DOB: [birth year]   Care Coordination Outreach Attempts: An unsuccessful telephone outreach was attempted today to offer the patient information about available care coordination services.  Follow Up Plan:  Additional outreach attempts will be made to offer the patient care coordination information and services.   Encounter Outcome:  No Answer  Gwenevere Ghazi  Care Coordination Care Guide  Direct Dial: [phone]

## 2023-08-17 NOTE — Progress Notes (Signed)
Care Coordination   Note   08/17/2023 Name: Shelby Robertson MRN: [id number] DOB: [birth year]  Shelby Robertson is a 52 y.o. year old female who sees Arnette Felts, FNP for primary care. I reached out to Faylene Million by phone today to offer care coordination services.  [name] was given information about Care Coordination services today including:   The Care Coordination services include support from the care team which includes your Nurse Coordinator, Clinical Social Worker, or Pharmacist.  The Care Coordination team is here to help remove barriers to the health concerns and goals most important to you. Care Coordination services are voluntary, and the patient may decline or stop services at any time by request to their care team member.   Care Coordination Consent Status: Patient agreed to services and verbal consent obtained.   Follow up plan:  Telephone appointment with care coordination team member scheduled for:  08/25/23  Encounter Outcome:  Patient Scheduled  [facility] Coordination Care Guide  Direct Dial: [phone]

## 2023-08-25 ENCOUNTER — Ambulatory Visit: Payer: Self-pay | Admitting: Licensed Clinical Social Worker

## 2023-08-25 NOTE — Patient Outreach (Signed)
  Care Coordination   08/25/2023 Name: Shelby Robertson MRN: [id number] DOB: 1970/11/07   Care Coordination Outreach Attempts:  An unsuccessful telephone outreach was attempted for a scheduled appointment today.  Follow Up Plan:  Additional outreach attempts will be made to offer the patient care coordination information and services.   Encounter Outcome:  No Answer   Care Coordination Interventions:  No, not indicated    Jeanie Cooks, PhD [facility], [facility] Social Worker Direct Dial: [phone]  Fax: [phone]

## 2023-08-26 ENCOUNTER — Other Ambulatory Visit: Payer: Self-pay | Admitting: Nurse Practitioner

## 2023-08-26 DIAGNOSIS — E669 Obesity, unspecified: Secondary | ICD-10-CM

## 2023-08-26 DIAGNOSIS — E782 Mixed hyperlipidemia: Secondary | ICD-10-CM

## 2023-08-30 ENCOUNTER — Telehealth: Payer: Self-pay | Admitting: *Deleted

## 2023-08-30 NOTE — Progress Notes (Signed)
  Care Coordination Note  08/30/2023 Name: Shelby Robertson MRN: [id number] DOB: 10-09-1971  Shelby Robertson is a 53 y.o. year old female who is a primary care patient of Arnette Felts, FNP and is actively engaged with the care management team. I reached out to Faylene Million by phone today to assist with re-scheduling an initial visit with the BSW  Follow up plan: Unsuccessful telephone outreach attempt made. A HIPAA compliant phone message was left for the patient providing contact information and requesting a return call.  [facility]  Care Coordination Care Guide  Direct Dial: [phone]

## 2023-09-06 NOTE — Progress Notes (Signed)
  Care Coordination Note  09/06/2023 Name: Shelby Robertson MRN: [id number] DOB: 1970/11/21  Shelby Robertson is a 52 y.o. year old female who is a primary care patient of Arnette Felts, FNP and is actively engaged with the care management team. I reached out to Faylene Million by phone today to assist with re-scheduling an initial visit with the BSW  Follow up plan: Unsuccessful telephone outreach attempt made. A HIPAA compliant phone message was left for the patient providing contact information and requesting a return call. Unable to make contact on outreach attempts x 2.  We have been unable to make contact with the patient for follow up. The care management team is available to follow up with the patient after provider conversation with the patient regarding recommendation for care management engagement and subsequent re-referral to the care management team.  [facility] Coordination Care Guide  Direct Dial: [phone]

## 2023-09-09 ENCOUNTER — Other Ambulatory Visit: Payer: Self-pay | Admitting: Nurse Practitioner

## 2023-09-09 DIAGNOSIS — E782 Mixed hyperlipidemia: Secondary | ICD-10-CM

## 2023-09-09 DIAGNOSIS — E669 Obesity, unspecified: Secondary | ICD-10-CM

## 2023-09-09 DIAGNOSIS — E1169 Type 2 diabetes mellitus with other specified complication: Secondary | ICD-10-CM

## 2023-09-23 ENCOUNTER — Other Ambulatory Visit: Payer: Self-pay | Admitting: Nurse Practitioner

## 2023-09-23 DIAGNOSIS — E1169 Type 2 diabetes mellitus with other specified complication: Secondary | ICD-10-CM

## 2023-09-23 DIAGNOSIS — E782 Mixed hyperlipidemia: Secondary | ICD-10-CM

## 2024-02-22 ENCOUNTER — Other Ambulatory Visit: Payer: Self-pay | Admitting: Nurse Practitioner

## 2024-02-22 DIAGNOSIS — E1169 Type 2 diabetes mellitus with other specified complication: Secondary | ICD-10-CM

## 2024-02-22 DIAGNOSIS — E782 Mixed hyperlipidemia: Secondary | ICD-10-CM
